# Patient Record
Sex: Male | Born: 1955
Health system: Southern US, Community
[De-identification: ages and names within clinical notes are randomized; demographics above are authoritative.]

## PROBLEM LIST (undated history)

## (undated) DIAGNOSIS — R7303 Prediabetes: Secondary | ICD-10-CM

## (undated) DIAGNOSIS — R519 Headache, unspecified: Secondary | ICD-10-CM

## (undated) DIAGNOSIS — R918 Other nonspecific abnormal finding of lung field: Secondary | ICD-10-CM

## (undated) DIAGNOSIS — I10 Essential (primary) hypertension: Secondary | ICD-10-CM

## (undated) DIAGNOSIS — Z972 Presence of dental prosthetic device (complete) (partial): Secondary | ICD-10-CM

## (undated) DIAGNOSIS — R51 Headache: Secondary | ICD-10-CM

## (undated) DIAGNOSIS — G4730 Sleep apnea, unspecified: Secondary | ICD-10-CM

## (undated) DIAGNOSIS — Z973 Presence of spectacles and contact lenses: Secondary | ICD-10-CM

## (undated) DIAGNOSIS — G8929 Other chronic pain: Secondary | ICD-10-CM

## (undated) DIAGNOSIS — M1990 Unspecified osteoarthritis, unspecified site: Secondary | ICD-10-CM

## (undated) DIAGNOSIS — M549 Dorsalgia, unspecified: Secondary | ICD-10-CM

## (undated) DIAGNOSIS — H9190 Unspecified hearing loss, unspecified ear: Secondary | ICD-10-CM

## (undated) HISTORY — PX: MULTIPLE TOOTH EXTRACTIONS: SHX2053

## (undated) HISTORY — PX: BACK SURGERY: SHX140

## (undated) HISTORY — PX: SPINAL CORD STIMULATOR IMPLANT: SHX2422

---

## 2002-03-19 ENCOUNTER — Encounter: Payer: Self-pay | Admitting: Internal Medicine

## 2002-03-19 ENCOUNTER — Ambulatory Visit (HOSPITAL_COMMUNITY): Admission: RE | Admit: 2002-03-19 | Discharge: 2002-03-19 | Payer: Self-pay | Admitting: Internal Medicine

## 2002-05-06 ENCOUNTER — Ambulatory Visit (HOSPITAL_COMMUNITY): Admission: RE | Admit: 2002-05-06 | Discharge: 2002-05-06 | Payer: Self-pay | Admitting: Neurology

## 2002-05-06 ENCOUNTER — Encounter: Payer: Self-pay | Admitting: Neurology

## 2002-07-27 ENCOUNTER — Encounter: Admission: RE | Admit: 2002-07-27 | Discharge: 2002-07-27 | Payer: Self-pay | Admitting: Neurosurgery

## 2002-07-27 ENCOUNTER — Encounter: Payer: Self-pay | Admitting: Neurosurgery

## 2002-09-25 ENCOUNTER — Encounter: Payer: Self-pay | Admitting: Neurosurgery

## 2002-09-30 ENCOUNTER — Ambulatory Visit: Admission: RE | Admit: 2002-09-30 | Discharge: 2002-09-30 | Payer: Self-pay | Admitting: Neurosurgery

## 2002-10-13 ENCOUNTER — Inpatient Hospital Stay (HOSPITAL_COMMUNITY): Admission: RE | Admit: 2002-10-13 | Discharge: 2002-10-15 | Payer: Self-pay | Admitting: Neurosurgery

## 2002-10-13 ENCOUNTER — Encounter: Payer: Self-pay | Admitting: Neurosurgery

## 2003-02-02 ENCOUNTER — Encounter (HOSPITAL_COMMUNITY): Admission: RE | Admit: 2003-02-02 | Discharge: 2003-03-04 | Payer: Self-pay | Admitting: Neurosurgery

## 2003-03-05 ENCOUNTER — Encounter (HOSPITAL_COMMUNITY): Admission: RE | Admit: 2003-03-05 | Discharge: 2003-04-04 | Payer: Self-pay | Admitting: Neurosurgery

## 2004-01-20 ENCOUNTER — Encounter: Admission: RE | Admit: 2004-01-20 | Discharge: 2004-01-20 | Payer: Self-pay | Admitting: Neurosurgery

## 2004-03-07 ENCOUNTER — Ambulatory Visit (HOSPITAL_COMMUNITY): Admission: RE | Admit: 2004-03-07 | Discharge: 2004-03-08 | Payer: Self-pay | Admitting: Neurosurgery

## 2004-03-14 ENCOUNTER — Ambulatory Visit (HOSPITAL_COMMUNITY): Admission: RE | Admit: 2004-03-14 | Discharge: 2004-03-14 | Payer: Self-pay | Admitting: Neurosurgery

## 2004-05-22 ENCOUNTER — Inpatient Hospital Stay (HOSPITAL_COMMUNITY): Admission: AD | Admit: 2004-05-22 | Discharge: 2004-05-30 | Payer: Self-pay | Admitting: Neurosurgery

## 2004-06-27 ENCOUNTER — Inpatient Hospital Stay (HOSPITAL_COMMUNITY): Admission: EM | Admit: 2004-06-27 | Discharge: 2004-06-29 | Payer: Self-pay | Admitting: Emergency Medicine

## 2004-08-31 ENCOUNTER — Ambulatory Visit (HOSPITAL_COMMUNITY): Admission: RE | Admit: 2004-08-31 | Discharge: 2004-08-31 | Payer: Self-pay | Admitting: Neurosurgery

## 2004-11-14 ENCOUNTER — Ambulatory Visit (HOSPITAL_COMMUNITY): Admission: RE | Admit: 2004-11-14 | Discharge: 2004-11-15 | Payer: Self-pay | Admitting: Neurosurgery

## 2004-11-20 ENCOUNTER — Ambulatory Visit (HOSPITAL_COMMUNITY): Admission: RE | Admit: 2004-11-20 | Discharge: 2004-11-20 | Payer: Self-pay | Admitting: Neurosurgery

## 2004-11-22 ENCOUNTER — Ambulatory Visit (HOSPITAL_COMMUNITY): Admission: RE | Admit: 2004-11-22 | Discharge: 2004-11-22 | Payer: Self-pay | Admitting: Neurosurgery

## 2006-07-26 ENCOUNTER — Ambulatory Visit (HOSPITAL_COMMUNITY): Admission: RE | Admit: 2006-07-26 | Discharge: 2006-07-26 | Payer: Self-pay | Admitting: Family Medicine

## 2008-04-13 ENCOUNTER — Ambulatory Visit (HOSPITAL_COMMUNITY): Admission: RE | Admit: 2008-04-13 | Discharge: 2008-04-13 | Payer: Self-pay | Admitting: *Deleted

## 2008-04-15 ENCOUNTER — Encounter (HOSPITAL_COMMUNITY): Admission: RE | Admit: 2008-04-15 | Discharge: 2008-05-15 | Payer: Self-pay | Admitting: Psychiatry

## 2008-04-20 ENCOUNTER — Ambulatory Visit (HOSPITAL_COMMUNITY): Admission: RE | Admit: 2008-04-20 | Discharge: 2008-04-20 | Payer: Self-pay | Admitting: *Deleted

## 2008-11-16 ENCOUNTER — Ambulatory Visit (HOSPITAL_COMMUNITY): Admission: RE | Admit: 2008-11-16 | Discharge: 2008-11-16 | Payer: Self-pay | Admitting: Pulmonary Disease

## 2009-04-04 ENCOUNTER — Ambulatory Visit (HOSPITAL_COMMUNITY): Admission: RE | Admit: 2009-04-04 | Discharge: 2009-04-04 | Payer: Self-pay | Admitting: General Surgery

## 2010-12-03 ENCOUNTER — Encounter: Payer: Self-pay | Admitting: Internal Medicine

## 2011-02-26 LAB — BLOOD GAS, ARTERIAL
Acid-Base Excess: 1.2 mmol/L (ref 0.0–2.0)
Bicarbonate: 26.1 mEq/L — ABNORMAL HIGH (ref 20.0–24.0)
FIO2: 21 %
O2 Saturation: 90.9 %
Patient temperature: 37
TCO2: 23.5 mmol/L (ref 0–100)
pCO2 arterial: 47.6 mmHg — ABNORMAL HIGH (ref 35.0–45.0)
pH, Arterial: 7.358 (ref 7.350–7.450)
pO2, Arterial: 64.3 mmHg — ABNORMAL LOW (ref 80.0–100.0)

## 2011-03-27 NOTE — Procedures (Signed)
NAME:  Alexander Hatfield, BIFULCO NO.:  [id number]   MEDICAL RECORD NO.:  [id number]          PATIENT TYPE:  OUT   LOCATION:  RESP                          FACILITY:  [facility]   PHYSICIAN:  Edward L. Juanetta Gosling, M.D.DATE OF BIRTH:  1956/01/22   DATE OF PROCEDURE:  DATE OF DISCHARGE:                            PULMONARY FUNCTION TEST   1. Spirometry shows borderline ventilatory defect with evidence of      airflow obstruction.  2. Lung volumes are normal with air trapping.  3. DLCO is mildly reduced.  4. Arterial blood gases show relative hypoxemia and slight increase in      pCO2 suggesting chronic respiratory failure.  5. There is no significant bronchodilator improvement.  This study is      similar to the one of April 21, 2008.      Edward L. Juanetta Gosling, M.D.  Electronically Signed     ELH/MEDQ  D:  11/19/2008  T:  11/19/2008  Job:  [id number]

## 2011-03-27 NOTE — H&P (Signed)
NAME:  Alexander Hatfield, Alexander Hatfield NO.:  [id number]   MEDICAL RECORD NO.:  [id number]          PATIENT TYPE:  AMB   LOCATION:  DAY                           FACILITY:  [facility]   PHYSICIAN:  Dalia Heading, M.D.  DATE OF BIRTH:  1956-03-26   DATE OF ADMISSION:  DATE OF DISCHARGE:  LH                              HISTORY & PHYSICAL   CHIEF COMPLAINT:  Need for screening colonoscopy.   HISTORY OF PRESENT ILLNESS:  The patient is a 55 year old white male who  is referred for endoscopic evaluation.  He needs a colonoscopy for  screening purposes.  No abdominal pain, weight loss, nausea, vomiting,  diarrhea, constipation, melena, or hematochezia have been noted.  He has  never had a colonoscopy.  There is no family history of colon carcinoma.   PAST MEDICAL HISTORY:  Chronic back pain.   PAST SURGICAL HISTORY:  Multiple back surgeries, neck surgery.   CURRENT MEDICATIONS:  Oxycodone, OxyContin, and terazosin.   ALLERGIES:  MRI DYE.   REVIEW OF SYSTEMS:  Noncontributory.   PHYSICAL EXAMINATION:  GENERAL:  The patient is a well-developed, well-  nourished, white male, in no acute distress.  LUNGS:  Clear to auscultation with equal breath sounds bilaterally.  HEART:  Regular rate and rhythm without S3, S4, or murmurs.  ABDOMEN:  Soft, nontender, and nondistended.  No hepatosplenomegaly or  masses are noted.  RECTAL:  Deferred for the procedure.   IMPRESSION:  Need for screening colonoscopy.   PLAN:  The patient is scheduled for a colonoscopy on Apr 04, 2009.  The  risks and benefits of the procedure including bleeding and perforation  were fully explained to the patient, gave informed consent.      Dalia Heading, M.D.  Electronically Signed     MAJ/MEDQ  D:  03/31/2009  T:  04/01/2009  Job:  [id number]   cc:   Corrie Mckusick, M.D.  Fax: [phone]   [facility] Short Stay

## 2011-03-27 NOTE — Procedures (Signed)
NAME:  Alexander Hatfield, Alexander Hatfield NO.:  [id number]   MEDICAL RECORD NO.:  [id number]          PATIENT TYPE:  OUT   LOCATION:  RESP                          FACILITY:  [facility]   PHYSICIAN:  Edward L. Juanetta Gosling, M.D.DATE OF BIRTH:  [birth year]   DATE OF PROCEDURE:  DATE OF DISCHARGE:                            PULMONARY FUNCTION TEST   PULMONARY FUNCTION TEST  1. Spirometry shows no definite ventilatory defect, but does show      evidence of airflow obstruction that is most marked in the smaller      airways.  2. Lung volumes are normal.  3. DLCO was mildly reduced.  4. Arterial blood gases show relative resting hypoxia and elevated      pCO2 suggesting chronic respiratory failure.  5. There is no significant bronchodilator improvement.      Edward L. Juanetta Gosling, M.D.  Electronically Signed     ELH/MEDQ  D:  04/21/2008  T:  04/21/2008  Job:  [id number]   cc:   Dani Gobble, MD  Fax: [phone]

## 2011-03-30 NOTE — H&P (Signed)
NAME:  Alexander Hatfield, Alexander Hatfield NO.:  [id number]   MEDICAL RECORD NO.:  [id number]                   PATIENT TYPE:  INP   LOCATION:  3002                                 FACILITY:  [facility]   PHYSICIAN:  Hewitt Shorts, M.D.            DATE OF BIRTH:  Dec 27, 1955   DATE OF ADMISSION:  06/27/2004  DATE OF DISCHARGE:                                HISTORY & PHYSICAL   HISTORY OF PRESENT ILLNESS:  The patient is a 55 year old left-handed white  male who has been a patient of Payton Doughty, M.D., apparently for an extended  period of time.  He has a history of nine back surgeries and Dr. Channing Mutters placed  a spinal cord stimulator trial in late April with placement of generator and  battery in early May.  The patient did well until about five weeks ago, when  he presented with a cellulitis overlying the generator/battery.  Dr. Channing Mutters  admitted the patient.  The wound was opened, the generator battery was  removed and the wound debrided.  The patient was on one week of intravenous  antibiotics in hospital and then three weeks of outpatient intravenous  vancomycin.   The patient had been in relatively stable condition up until earlier today,  when he noticed breakdown of the thoracic incision where the spinal cord  stimulator leads had been placed.  Those had not been removed five weeks  ago, and he is concerned that part of the wire was protruding out.  I spoke  to the patient at 2130 and instructed him to come immediately to the [facility] emergency room.  He told me that he had already called an  ambulance; however, apparently for some reason he was sent to [facility] despite my instructions to come to [facility].  He was evaluated by Dr. Mosetta Putt at [facility].  He was found  to have a temperature of 99.4, pulse 94, and a blood pressure 156/107.  It  does not appear that any workup was performed, but apparently the  patient  was given Dilaudid and subsequently transferred to [facility],  arriving at about 0230.  During transport he apparently was given some  morphine.   The patient presents to the emergency room complaining of incapacitating  pain across the low back with breakdown of the thoracic wounds.   He describes a history of nine back surgeries as well as the placement of  the spinal cord stimulator this past spring with subsequent development of  cellulitis last month requiring removal of the generator battery about five  weeks ago.   PAST MEDICAL HISTORY:  Notable for a history of hypertension, for which he  takes Diovan 320 mg daily.   Previous surgeries include both anterior cervical diskectomy and fusions in  2001 and 1994 as well several lumbar surgeries and fusions.  He reports an allergy to IV CONTRAST.   CURRENT MEDICATIONS:  Diovan 320 mg daily as well as Percocet and Flexeril.   FAMILY HISTORY:  His mother is 60, in poor health with lupus.  His father  passed on from COPD related to inhalational toxins.   SOCIAL HISTORY:  The patient smokes two packs a day.  He drinks alcohol on a  social basis.  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   REVIEW OF SYSTEMS:  Notable for those described in the history of present  illness and past medical history but otherwise unremarkable.   PHYSICAL EXAMINATION:  GENERAL:  The patient is a well-developed, well-  nourished white male in obvious discomfort.  VITAL SIGNS:  His temperature is 97.4, pulse 81, blood pressure 158/108,  respiratory rate 20.  CHEST:  Lungs are clear to auscultation.  He has symmetrical respiratory  excursion.  CARDIAC:  Regular rate and rhythm, normal S1, S2.  There is no murmur.  ABDOMEN:  Soft, nondistended.  Bowel sounds are present.  EXTREMITIES:  No clubbing, cyanosis, or edema.  WOUNDS:  At the lower end of the thoracic incision is an approximately 3 mm  in length  area of breakdown with some protruding tissue.  I do not see any  associated erythema or purulence.  NEUROLOGIC:  He is standing and walking around the emergency room.   IMPRESSION:  1. Superficial wound breakdown, rule out wound infection.  2. Intractable pain.   PLAN:  The patient will be admitted to the 3000 unit for further care.  Laboratories have been requested, including CBC with differential,  sedimentation rate, blood cultures x2, Chem-24, and will obtain x-rays of  the thoracic and lumbar spine, AP and lateral views of each.  The patient  will be supported with intravenous fluids and kept NPO awaiting Dr. Temple Pacini  evaluation and determination of whether to proceed with surgical  intervention.  The patient in the meantime will be placed on a PCA morphine  pump.                                                Hewitt Shorts, M.D.    RWN/MEDQ  D:  06/27/2004  T:  06/27/2004  Job:  [id number]

## 2011-03-30 NOTE — Op Note (Signed)
NAME:  Alexander Hatfield, Alexander Hatfield NO.:  [id number]   MEDICAL RECORD NO.:  [id number]          PATIENT TYPE:  OIB   LOCATION:  2899                         FACILITY:  [facility]   PHYSICIAN:  Payton Doughty, M.D.      DATE OF BIRTH:  1956/04/21   DATE OF PROCEDURE:  11/14/2004  DATE OF DISCHARGE:                                 OPERATIVE REPORT   PREOPERATIVE DIAGNOSIS:  Intractable back pain status post removal of spinal  cord stimulator for infection.   POSTOPERATIVE DIAGNOSIS:  Intractable back pain status post removal of  spinal cord stimulator for infection.   PROCEDURE:  Placement of spinal cord epidural electrode.   SURGEON:  Payton Doughty, M.D.   ANESTHESIA:  General endotracheal.   PREP:  Prepped with Betadine prep and scrubbed with alcohol wipe.   COMPLICATIONS:  None.   A 55 year old gentleman with severe lumbar spondylosis and intractable pain  who had had a stimulator removed because of infection about five months ago.  Taken to the operating room, smoothly anesthetized and intubated,  placed  prone on the operating table. Following shaving, prepping, and draping in  the usual sterile fashion, skin was infiltrated with 1% lidocaine with  1:400,000 epinephrine. Localizing x-ray was made. Incision was made from the  top of T12 to the bottom of T8. The T11 laminectomy site was dissected free.  The laminotomy was increased. An attempt was made to pass the catheter in  the epidural space which turned out to be densely scarred. Laminotomy of T10  and T9 were then carried out midline to allow dissection of the epidural  space from above and below. This was carried out, and the electrode was able  to pass freely. It sits at the T10/T11 junction, goes up to the top of T9.  Wound was irrigated and hemostasis assured. Anchoring stays were placed, and  the extension wires attached and exited. The strain relief devices were  attached prior to exiting. Wound was irrigated again.  The fascia was  reapproximated with 0 Vicryl in interrupted fashion. Subcutaneous tissue was  reapproximated with 2-0 Vicryl in interrupted fashion. Skin was closed with  3-0 nylon in a running locking fashion. Betadine and Telfa dressing was  applied and made occlusive with OpSite and the patient returned to recovery  room in good condition.       MWR/MEDQ  D:  11/14/2004  T:  11/14/2004  Job:  [id number]

## 2011-03-30 NOTE — Op Note (Signed)
NAME:  SEMIR, BRILL NO.:  [id number]   MEDICAL RECORD NO.:  [id number]                   PATIENT TYPE:  INP   LOCATION:  3172                                 FACILITY:  [facility]   PHYSICIAN:  Payton Doughty, M.D.                   DATE OF BIRTH:  1956-08-27   DATE OF PROCEDURE:  10/13/2002  DATE OF DISCHARGE:                                 OPERATIVE REPORT   PREOPERATIVE DIAGNOSIS:  Spondylosis and severe degenerative disk disease at  L3-4 and L4-5.   POSTOPERATIVE DIAGNOSIS:  Spondylosis and severe degenerative disk disease  at L3-4 and L4-5.   PROCEDURE:  L3-L4 and L4-L5 laminectomy, diskectomy and posterior lumbar  interbody fusion with Ray threaded fusion cages and L3-L5 segmental  instrumentation with pedicle screws and posterior lateral arthrodesis.   SURGEON:  Payton Doughty, M.D.   ASSISTANT:  Coletta Memos, M.D.]   ANESTHESIA:  General endotracheal.   PREPARATION:  Prepped with sterile Betadine prep and scrubbed with alcohol  wipe.   COMPLICATIONS:  None.   INDICATIONS:  This is a 55 year old gentleman with severe spondylitic  disease at L3-4 and L4-5.  He has been previously at L5-S1 a number of years  ago.   DESCRIPTION OF PROCEDURE:  He was taken to the operating room, smoothly  anesthetized and intubated and placed prone on the operating room table.  Following shave, prep and draped in the usual sterile fashion, the skin was  infiltrated with 1% lidocaine with 1:400,000 epinephrine.  The skin was  incised from the bottom of L2 to the top of the L5 and the lamina of L3 and  L4 and the L3-4, L4-5 and L2-3 facet joints were uncovered facilitating the  dissection of the L3, L4 and L5 transverse processes.  Intraoperative x-ray  confirmed correctness level.  Having confirmed correctness level, the pars  intra-articularis lamina and inferior facet of L3 and L4 and the superior  facet of L4 and L5 were removed bilaterally using the drill  and the bone set  aside for grafting.  The ligament of flavum was removed.  The epidural space  was carefully explored and found to be extensively scarred with dense  adhesions.  Careful dissection of all of the nerve roots allowed complete  decompression of the L3, L4 and L5 roots bilaterally.  Having completed this  decompression, diskectomy was carried out bilaterally at L3-4 and L4-5 and a  14 x 21 mm Ray threaded fusion cages were placed.  Pedicle screws were then  placed at L3, L4, and L5 using the standard landmarks.  Intraoperative x-  rays showed good placement of pedicle screws and Ray cages.  The cages were  packed and bone harvested from the facet joints.  They were linked with the  rods and the screws capped and caps tightened.  Final x-rays showed good  placement of  all hardware.  Intertransverse bone was applied after  decorticating the transverse processes.  The wound was once again irrigated  and hemostasis assured.  The fascia was reapproximated with 0 Vicryl in a  running interrupted fashion.  The subcutaneous tissue was reapproximated  with 0 Vicryl in an  interrupted fashion.  The subcuticular tissue was reapproximated with 0  Vicryl in an interrupted fashion.  The skin was closed with 3-0 nylon in a  running locked fashion.  Bacitracin and Telfa dressing was applied and made  occlusive with Op-Site.  The patient returned to the recovery room in good  condition.                                               Payton Doughty, M.D.    MWR/MEDQ  D:  10/13/2002  T:  10/13/2002  Job:  [id number]

## 2011-03-30 NOTE — Discharge Summary (Signed)
NAME:  Alexander Hatfield, OVERLEY NO.:  [id number]   MEDICAL RECORD NO.:  [id number]          PATIENT TYPE:  INP   LOCATION:  3033                         FACILITY:  [facility]   PHYSICIAN:  Payton Doughty, M.D.      DATE OF BIRTH:  27-Apr-1956   DATE OF ADMISSION:  05/22/2004  DATE OF DISCHARGE:  05/30/2004                                 DISCHARGE SUMMARY   ADMITTING DIAGNOSES:  Cellulitis of the spinal cord stimulator site.   DISCHARGE DIAGNOSES:  Cellulitis of the spinal cord stimulator site.   OPERATIVE PROCEDURE:  Removal of spinal cord stimulator.   COMPLICATIONS:  None.   DISCHARGE STATUS:  Well.   A 55 year old left-handed white gentleman who had spinal cord stimulator  placed in April.  Been doing well.  Scratched the top of it, injury to skin.  Developed erythema over the site.  Treated with oral Cipro to no avail.  Is  admitted for removal of the stimulator.   PAST MEDICAL HISTORY:  Remarkable for hypertension.   PHYSICAL EXAMINATION:  GENERAL:  Intact.  NEUROLOGIC:  Intact.  SKIN:  There was cellulitis over the spinal cord stimulator site.   He was admitted and underwent removal of the stimulator.  He has visited  with ID.  Had a PICC line placed.  He was placed on vancomycin.  While in  the hospital his vancomycin levels were stabilized.  He was then arranged to  be discharged home with the vancomycin.  He was discharged on vancomycin  1500 mg q.12h.  Follow-up will be with me in the [facility] office after completion of the antibiotics for suture removal and  for arranging to have a spinal cord stimulator replaced.       MWR/MEDQ  D:  10/17/2004  T:  10/17/2004  Job:  [id number]

## 2011-03-30 NOTE — Op Note (Signed)
NAME:  Alexander Hatfield, PINEDA NO.:  [id number]   MEDICAL RECORD NO.:  [id number]                   PATIENT TYPE:  OIB   LOCATION:  2899                                 FACILITY:  [facility]   PHYSICIAN:  Payton Doughty, M.D.                   DATE OF BIRTH:  12/28/1955   DATE OF PROCEDURE:  03/14/2004  DATE OF DISCHARGE:  03/14/2004                                 OPERATIVE REPORT   PREOPERATIVE DIAGNOSIS:  Implanted spinal cord stimulator.   POSTOPERATIVE DIAGNOSIS:  Implanted spinal cord stimulator.   OPERATION PERFORMED:  Implanted spinal cord stimulator receiver.   SURGEON:  Payton Doughty, M.D.   ANESTHESIA:  General endotracheal.   PREP:  Sterile Betadine prep and scrub with alcohol wipe.   COMPLICATIONS:  None.   NURSE ASSISTANT:  [place].   INDICATIONS FOR PROCEDURE:  The patient is a 55 year old gentleman with an  implanted spinal cord stimulator that has had a successful trial and he is  now for implantation of the receiver.   DESCRIPTION OF PROCEDURE:  The patient was taken to the operating room,  smoothly anesthetized, intubated, and placed prone on the operating table.  Sutures were removed.  Following shave, prep and drape in the usual sterile  fashion, the old skin incision was reopened.  The wires recovered.  They  were disconnected from the extension wires which were divided and removed.  Subcutaneous tunnel was created to the right down to the right side of the  posterior superior iliac spine where the scar from where his bone graft was  taken numerous years ago.  Through separate incision there, a subcutaneous  pocket with a thin layer of skin over it was created.  The receiver was  connected to the wires.  The strain relief protective devices were placed  and secured with 3-0 silk.  The torque screw driver was used to tighten the  screws down, the connections were secured.  The wires and the receiver were  implanted into the subcutaneous  space.  Both incisions were irrigated with  bacitracin.  The fascia and subcutaneous tissues were reapproximated with 3-  0 Vicryl in interrupted fashion and the skin was closed with 3-0 nylon in  simple running fashion.  Betadine Telfa dressing was applied, made occlusive  with OpSite.  The patient was then transferred to the recovery room in good  condition.                                               Payton Doughty, M.D.    MWR/MEDQ  D:  03/14/2004  T:  03/15/2004  Job:  [id number]
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
## 2011-03-30 NOTE — H&P (Signed)
NAME:  Alexander Hatfield, Alexander Hatfield NO.:  [id number]   MEDICAL RECORD NO.:  [id number]          PATIENT TYPE:  OIB   LOCATION:  2899                         FACILITY:  [facility]   PHYSICIAN:  Payton Doughty, M.D.      DATE OF BIRTH:  08/06/1956   DATE OF ADMISSION:  11/22/2004  DATE OF DISCHARGE:                                HISTORY & PHYSICAL   ADMITTING DIAGNOSIS:  Spinal cord stimulator in place.   SERVICE:  Neurosurgery.   BODY OF TEXT:  This is a 55 year old gentleman who a week ago underwent  placement of a spinal cord stimulator.  He has reasonable coverage, except  does not get quite below his knees.  The plan is to admit him and move his  stimulator down and implant a battery pack.   MEDICAL HISTORY:  Medical history is remarkable for hypertension; he takes  Diovan 325 mg a day.  He has had an anterior cervical fusion in 2001 and in  1994, and has had lumbar surgeries and fusions as noted above.   ALLERGIES:  IV CONTRAST.   FAMILY HISTORY:  Mom is 80, in poor health.  Father died of COPD.   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   REVIEW OF SYSTEMS:  Review of systems is remarkable for back and leg pain.   PHYSICAL EXAMINATION:  HEENT:  Exam is within normal limits.  NECK:  He has reasonable range of motion in his neck.  CHEST:  Chest is clear.  CARDIAC:  Regular rate and rhythm.  ABDOMEN:  Abdomen is nontender with no hepatosplenomegaly.  Incisions are  well-healed.  EXTREMITIES:  Extremities are without clubbing or cyanosis.  GU:  Exam is deferred.  PERIPHERAL VASCULAR:  Peripheral pulses are good.  SKIN:  He has got a little bit of drainage around his wires.  NEUROLOGIC:  Neurologically, He is awake, alert and oriented.  His cranial  nerves are intact.  Motor exam is intact, except for the dorsi and  plantarflexors of the right foot, which have been 2/5 for the past 10  years.   CLINICAL IMPRESSION:  Intractable back pain with displaced spinal cord  stimulator.   PLAN:  Plan is for moving the stimulator and electrodes down to cover over  T11 and then to place a battery generator. He understands the risks and  benefits, and wishes to proceed.       MWR/MEDQ  D:  11/22/2004  T:  11/22/2004  Job:  [id number]

## 2011-03-30 NOTE — H&P (Signed)
NAME:  Alexander Hatfield, Alexander Hatfield NO.:  [id number]   MEDICAL RECORD NO.:  [id number]                   PATIENT TYPE:  INP   LOCATION:  3033                                 FACILITY:  [facility]   PHYSICIAN:  Payton Doughty, M.D.                   DATE OF BIRTH:  10/24/1956   DATE OF ADMISSION:  05/22/2004  DATE OF DISCHARGE:                                HISTORY & PHYSICAL   ADMISSION DIAGNOSIS:  Superficial cellulitis of her spinal cord stimulator  site.   SERVICE:  Neurosurgery.   BODY OF TEXT:  This is a 55 year old left handed white gentleman with a  spinal cord stimulator placed March 07, 2004.  He had done well and then  about a week ago, was scratching an itch over the top of it and injured his  skin a little bit.  He came to the office three days ago with redness and  erythema over it but no palpable fluid in the pouch.  He was placed on oral  Cipro and told to come back to the office today.  He reports back to the  office today.  The erythema is increased and he is now admitted for IV  antibiotics and probable removal of the spinal cord stimulator and receiver.   PAST MEDICAL HISTORY:  Remarkable for hypertension.   CURRENT MEDICATIONS:  He takes Diovan 325 mg a day.   ALLERGIES:  IVP dye.   PAST SURGICAL HISTORY:  Anterior cervical discectomy and fusion in 2001 and  1994 as well as several lumbar fusions.   SOCIAL HISTORY:  Smokes two packs cigarettes per day, drinks alcohol on a  social basis.  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   FAMILY HISTORY:  Mother is 64 and in poor health with lupus.  Father is  deceased from COPD related to inhalational toxins.   REVIEW OF SYMPTOMS:  Remarkable for glasses, hearing loss, hypertension, leg  weakness, back pain, and arthritis.   PHYSICAL EXAMINATION:  HEENT:  Within normal limits.  NECK:  Reasonable range of motion.  CHEST:  Clear.  CARDIAC:  Regular rate and  rhythm.  ABDOMEN:  Nontender, no hepatosplenomegaly.  EXTREMITIES:  No clubbing or cyanosis.  GU:  Deferred.  PULSES:  Peripheral pulses are good.  BACK:  His back over the stimulator has a lot of erythema and redness  reaching all the way around to his right flank and past the midline to the  left.  NEUROLOGICAL:  Awake, alert, and oriented.  Cranial nerves intact.  Motor  exam shows 5/5 strength throughout the upper extremities and in the left  lower extremity.  The right lower extremity has weakness in the dorsiflexion  to about 3/5 and plantar flexion is 3/5, reflexes are 1 at the knees, trace  at the right ankle, absent at the  left.  Straight leg raise is negative.  His fusion has been solid on both CT and flexion extension criteria.   CLINICAL IMPRESSION:  Cellulitis over the top of spinal cord stimulator.  He  is going to be admitted, receive IV antibiotics, consultation of infectious  disease, and remove the spinal cord stimulating receiver, try to leave the  electrode in the epidural space to hold the space so that when it is  reimplanted, he can still derive benefit from the stimulator.  The risks and  benefits of this approach have been discussed with him and he wishes to  proceed.                                                Payton Doughty, M.D.    MWR/MEDQ  D:  05/22/2004  T:  05/22/2004  Job:  [id number]

## 2011-03-30 NOTE — Op Note (Signed)
NAME:  Alexander Hatfield, Alexander Hatfield NO.:  [id number]   MEDICAL RECORD NO.:  [id number]                   PATIENT TYPE:  OIB   LOCATION:  3172                                 FACILITY:  [facility]   PHYSICIAN:  Payton Doughty, M.D.                   DATE OF BIRTH:  [birth year]   DATE OF PROCEDURE:  03/07/2004  DATE OF DISCHARGE:                                 OPERATIVE REPORT   PREOPERATIVE DIAGNOSIS:  Intractable back and right lower extremity pain.   POSTOPERATIVE DIAGNOSIS:  Intractable back and right lower extremity pain.   OPERATIVE PROCEDURE:  Placement of an epidural thoracic spinal cord  stimulator.   SURGEON:  Payton Doughty, M.D.   NURSE ASSESSMENTBasilia Jumbo.   PREPARATION:  Sterile Betadine prep and scrub with alcohol wipe.   COMPLICATIONS:  None.   BODY OF TEXT:  A 55 year old gentleman with intractable back pain and  chronic right S1 radiculopathy.  He was taken to the operating room and  smoothly anesthetized and intubated, placed prone on the operating table.  Following shave, prep, and drape in the usual fashion, the skin was  infiltrated with 1% lidocaine and 1:400,000 epinephrine.  The skin incision  was made directly over the T11 spinous process.  The laminae of T11 were  exposed bilaterally.  Partial laminectomy of T11 was carried out, exposing  the posterior epidural space.  The ANS spinal cord stimulating electrode  array was advanced from this laminectomy superiorly.  On initial x-ray it  was slightly off to the left side and not quite far enough up.  It was  repositioned, slightly biased toward the right, and advanced further.  The  final position was such that the stimulating electrode started at the bottom  of T10 and extended up to just lower T8.  It was in the midline, perhaps  slightly off to the right.  The wound was irrigated and hemostasis assured.  The strain relief devices were attached to the interspinous ligament on the  anterior  portion of T12 and secured to the wires.  The extension devices  were attached and the water protection seals placed and secured.  The  extension cords were exited by trocar on the left side into the scapula.  The wound was once again irrigated and hemostasis assured.  The fascia was  reapproximated with 0 Vicryl in interrupted fashion, subcutaneous tissue was  reapproximated with 0 Vicryl in interrupted fashion, subcuticular tissue was  reapproximated with 3-0 Vicryl in interrupted fashion, and the skin was  closed with 3-0 nylon in simple running fashion.  Betadine and Telfa  dressing were placed at both sites.  The patient returned to the recovery  room in good condition.  Payton Doughty, M.D.    MWR/MEDQ  D:  03/07/2004  T:  03/07/2004  Job:  [id number]

## 2011-03-30 NOTE — Op Note (Signed)
NAME:  Alexander, Hatfield NO.:  [id number]   MEDICAL RECORD NO.:  [id number]                   PATIENT TYPE:  INP   LOCATION:  3033                                 FACILITY:  [facility]   PHYSICIAN:  Payton Doughty, M.D.                   DATE OF BIRTH:  11-29-1955   DATE OF PROCEDURE:  05/26/2004  DATE OF DISCHARGE:                                 OPERATIVE REPORT   PREOPERATIVE DIAGNOSIS:  Skin cellulitis over a spinal cord stimulator.   POSTOPERATIVE DIAGNOSIS:  Skin cellulitis over a spinal cord stimulator.   PROCEDURE:  Removal of spinal cord stimulating receiver.   SURGEON:  Payton Doughty, M.D.   ANESTHESIA:  General endotracheal.   PREPARATION:  Sterile Betadine prep and scrub with alcohol wipe.   COMPLICATIONS:  None.   BODY OF TEXT:  This is a 55 year old gentleman with an implanted spinal cord  stimulator, who has cellulitis over the receiver site and a possible  abscess.  He was taken to the operating room and smoothly anesthetized and  intubated, placed prone on the operating table.  Following shave, prep and  drape in the usual sterile fashion, the site of the stimulating electrode  was opened and the wires localized and divided.  The electrode was left in  place for use at a future site.  That incision was irrigated with  bacitracin.  There was no evidence of infection within it, and it was closed  with successive layers of 0 Vicryl, 2-0 Vicryl, and 3-0 nylon.  Attention  was then turned to the receiver site, where the cellulitic skin was divided  in the old incision site.  The skin itself was thickened and indurated;  however, entry into the site of the stimulating receiver was very well-  healed and there was no evidence of infection within there.  Because of the  contact with the infected skin, however, it was felt prudent to remove the  stimulating receiver.  This was removed and the wires withdrawn from under  the skin.  It was irrigated  copiously with bacitracin and closed over a #7  Jackson-Pratt drain.  There was one area of skin breakdown that was closed  primarily.  The incision itself was closed with 3-0 nylon in a simple  fashion.  A single nylon stitch was used to secure the drain.  No stitches  were placed subcutaneously for a desire not to place foreign bodies in an  infected area.  Betadine and Telfa dressings were used to cover the  incision, and the patient returned to the recovery room in good condition.  Payton Doughty, M.D.    MWR/MEDQ  D:  05/26/2004  T:  05/27/2004  Job:  [id number]

## 2011-03-30 NOTE — H&P (Signed)
NAME:  Alexander Hatfield, Alexander Hatfield NO.:  [id number]   MEDICAL RECORD NO.:  [id number]                   PATIENT TYPE:  OIB   LOCATION:  2899                                 FACILITY:  [facility]   PHYSICIAN:  Payton Doughty, M.D.                   DATE OF BIRTH:  1956/10/20   DATE OF ADMISSION:  03/14/2004  DATE OF DISCHARGE:                                HISTORY & PHYSICAL   ADMISSION DIAGNOSIS:  Installed spinal cord stimulator.   HISTORY:  This 55 year old, left-handed white gentleman who has had several  lumbar fusions and has had a right S1 radiculopathy since  1989, had a  spinal cord stimulator placed last week, and it was revised.  He now is  getting good relief during a trial, and presents for implant of the  receiver.  This is an interim note.   PHYSICAL EXAMINATION:  Intact.  His incisions are well-healed.   PLAN:  He is being taken to the operating room for an implantation of the  receiver.  The risks and benefits of this approach have been discussed with  him, and he wishes to proceed.                                                Payton Doughty, M.D.    MWR/MEDQ  D:  03/14/2004  T:  03/14/2004  Job:  [id number]

## 2011-03-30 NOTE — Op Note (Signed)
NAME:  Alexander Hatfield, Alexander Hatfield NO.:  [id number]   MEDICAL RECORD NO.:  [id number]                   PATIENT TYPE:  INP   LOCATION:  3002                                 FACILITY:  [facility]   PHYSICIAN:  Payton Doughty, M.D.                   DATE OF BIRTH:  [birth year]   DATE OF PROCEDURE:  06/27/2004  DATE OF DISCHARGE:                                 OPERATIVE REPORT   PREOPERATIVE DIAGNOSIS:  Infected incision at the site of an epidural spinal  cord stimulating electrode.   POSTOPERATIVE DIAGNOSIS:  Infected incision at the site of an epidural  spinal cord stimulating electrode.   OPERATIVE PROCEDURE:  Removal of electrode and exploration of incision.   SURGEON:  Payton Doughty, M.D.   SERVICE:  Neurosurgery   ANESTHESIA:  General endotracheal anesthesia.   PREPARATION:  Betadine and alcohol wipe.   COMPLICATIONS:  None.   ASSISTANT:  [place]   BODY OF TEXT:  This is a 55 year old gentleman who had a spinal cord  stimulating electrode placed some months ago.  He secondarily became  infected at the receiver that was removed about a month ago.  He had been  doing well and then developed back pain and drainage at the electrode site  as he did last night.  He is now taken to the operating room for exploration  of his site.  The patient was taken to the operating room, smoothly  anesthetized and intubated, placed prone on the operating table.  Following  shave, prep, and drape in the usual sterile fashion, the skin incision in  the thoracic spine was opened.  The tract of drainage was followed straight  down to a wire.  Careful dissection along the course of the wire and  irrigation revealed that the infection did not get all the way down the wire  but was stopped fairly superficially.  During the course of dissection, the  electrode was uncovered and removed because of fear of contamination.  The  entire thing was irrigated with Betadine, Bacitracin, and  saline.  It was  closed with 2-0 Vicryl and 3-0 nylon.  The patient returned to the recovery  room in good condition.                                               Payton Doughty, M.D.    MWR/MEDQ  D:  06/27/2004  T:  06/27/2004  Job:  [id number]

## 2011-03-30 NOTE — Discharge Summary (Signed)
   NAME:  AREN, PRYDE NO.:  [id number]   MEDICAL RECORD NO.:  [id number]                   PATIENT TYPE:  INP   LOCATION:  3013                                 FACILITY:  [facility]   PHYSICIAN:  Payton Doughty, M.D.                   DATE OF BIRTH:  1956-05-25   DATE OF ADMISSION:  10/13/2002  DATE OF DISCHARGE:  10/15/2002                                 DISCHARGE SUMMARY   ADMISSION DIAGNOSIS:  Spondylosis, L3-4, L4-5.   DISCHARGE DIAGNOSIS:  Spondylosis, L3-4, L4-5.   PROCEDURE:  L3-4, L4-5 laminectomy and diskectomy, posterior lumbar right  fusion, segmental instrumentation, posterolateral arthrodesis.   COMPLICATIONS:  None.   DISCHARGE STATUS:  Alive and well.   __________  A 55 year old right handed white gentleman who is history and physical is on  the chart. He has had several disk operations at 5-1 and has presented with  increasing discomfort at L3-4 and L4-5. Diskography is positive. Medical  history is remarkable for hypertension.   MEDICATIONS:  1. Diovan.  2. Percocet.   ALLERGIES:  IVP dye.   PHYSICAL EXAMINATION:  GENERAL:  Intact.  NEUROLOGIC EXAM:  Showed a right L5 and S1 radiculopathy.   HOSPITAL COURSE:  He was admitted after obtaining normal laboratory values  and underwent a 3-4, 4-5 laminectomy and diskectomy, posterior lumbar right  fusion with right-sided fusion cages __________ instrumentation.  Postoperatively, he had done extremely well. The first day, he was up and  about and Foley was out. The second day, he was up and about and PCA  stopped, and he is now up, eating and voiding normally, using oral pain  medications and wishing to be discharged home. He is being discharged home  in care of his family on Percocet for pain. He is to followup in [facility] office in about a week for suture removal.                                               Payton Doughty, M.D.    MWR/MEDQ  D:   10/15/2002  T:  10/16/2002  Job:  [id number]

## 2011-03-30 NOTE — H&P (Signed)
NAME:  Alexander Hatfield, Alexander Hatfield NO.:  [id number]   MEDICAL RECORD NO.:  [id number]          PATIENT TYPE:  OIB   LOCATION:                               FACILITY:  [facility]   PHYSICIAN:  Payton Doughty, M.D.      DATE OF BIRTH:  04/24/1956   DATE OF ADMISSION:  11/14/2004  DATE OF DISCHARGE:                                HISTORY & PHYSICAL   ADMITTING DIAGNOSIS:  Intractable back pain.   This is a 55 year old left-hand white gentleman who had a spinal cord  stimulator placed over a year ago.  He did well initially and then scratched  over the generator batter, had cellulitis, and had removal of the battery.  Then in August had infection of the epidural portion which was also removed.  He had a course of intravenous vancomycin after a short hospital stay.  He  now has intractable back and right leg and is admitted for replacement of  his stimulator.   PAST MEDICAL HISTORY:  Known for hypertension, takes Diovan 320 mg a day.  He has had an anterior cervical diskectomy and fusions in 2001 and 1994 had  lumbar surgeries and fusions.   ALLERGIES:  IV contrast.   MEDICATIONS:  Diovan 320 mg a day, Percocet, and Flexeril.   FAMILY HISTORY:  Mom is 75 in poor health.  Father died of COPD.   SOCIAL HISTORY:  The patient smokes two packs of cigarettes a day.  Drinks  alcohol on a social basis.  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   REVIEW OF SYSTEMS:  Remarkable for back pain and leg pain.   His sed rate is 9.  MRI does not demonstrate any new compression.   PHYSICAL EXAMINATION:  HEENT:  Within normal limits.  He has regional range  of motion in his neck.  CHEST:  Clear.  CARDIAC:  Regular rate and rhythm.  ABDOMEN:  Nontender.  No hepatosplenomegaly.  All of his incisions are well-  healed.  EXTREMITIES:  Without clubbing, cyanosis.  GU:  Deferred.  PERIPHERAL PULSES:  Good.  NEUROLOGIC:  He is awake, alert, and oriented.  Cranial nerves are intact.  Motor exam is intact  except for the dorsal and plantarflexions of the right  foot which have been 2/5 for the past 10 years.   CLINICAL IMPRESSION:  Intractable back pain.   He is admitted for reimplantation of spinal cord stimulator via an open  incision.  This will be a trial.  The risks and benefits of this approach  have been discussed with him and he wishes to proceed.        ___________________________________________  Payton Doughty, M.D.    MWR/MEDQ  D:  11/14/2004  T:  11/14/2004  Job:  [id number]

## 2011-03-30 NOTE — Op Note (Signed)
NAME:  DREVON, PLOG NO.:  [id number]   MEDICAL RECORD NO.:  [id number]          PATIENT TYPE:  OUT   LOCATION:  VASC                         FACILITY:  [facility]   PHYSICIAN:  Payton Doughty, M.D.      DATE OF BIRTH:  03/15/1956   DATE OF PROCEDURE:  11/22/2004  DATE OF DISCHARGE:  11/20/2004                                 OPERATIVE REPORT   PREOPERATIVE DIAGNOSIS:  Intractable back pain with implanted spinal cord  stimulator at T10.   POSTOPERATIVE DIAGNOSIS:  Intractable back pain with implanted spinal cord  stimulator at T10.   OPERATION PERFORMED:  Revision of spinal cord stimulator down to T11 and  implanting battery pulse generator.   SURGEON:  Payton Doughty, M.D.   ANESTHESIA:  General endotracheal.   PREP:  Sterile Betadine prep and scrub with alcohol wipe.   COMPLICATIONS:  None.   INDICATIONS FOR PROCEDURE:  The patient is a 55 year old gentleman who has  had an electrode implanted.  It was a bit high at T10, is going to be moved  down and then have the pulse generator placed.   DESCRIPTION OF PROCEDURE:  The patient was taken to the operating room,  smoothly anesthetized, intubated and placed prone on the operating table.  Following shave, prep and drape in the usual sterile fashion, the old  implant site was exposed.  The electrode was at mid-T10.  Working on the  lamina of T11 where there had been a prior electrode impaction, it was  gently dissected down to the dura and the electrode pulled back to a  satisfactory position such that it was in the same place that it was when it  had been working eight months ago.  The electrode was then anchored down and  tunneled subcutaneously to the left hip area where through a transverse  incision, the battery pack was placed after being connected to the  electrodes and secured.  Following placement, impedance was tested and found  to be within acceptable limits.  Both wounds were irrigated and hemostasis  assured, closed with successive layers of 2-0 Vicryl and 3-0 nylon.  Betadine and Telfa dressings were applied.  The patient was then transferred  to the recovery room in good condition.       MWR/MEDQ  D:  11/22/2004  T:  11/22/2004  Job:  [id number]
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
## 2011-08-09 LAB — BLOOD GAS, ARTERIAL
Acid-Base Excess: 4.9 — ABNORMAL HIGH
Bicarbonate: 29.9 — ABNORMAL HIGH
O2 Saturation: 90.9
Patient temperature: 37
TCO2: 26.8
pCO2 arterial: 52.7 — ABNORMAL HIGH
pH, Arterial: 7.372
pO2, Arterial: 61.8 — ABNORMAL LOW

## 2014-11-15 DIAGNOSIS — E669 Obesity, unspecified: Secondary | ICD-10-CM | POA: Diagnosis not present

## 2014-11-15 DIAGNOSIS — I10 Essential (primary) hypertension: Secondary | ICD-10-CM | POA: Diagnosis not present

## 2014-11-15 DIAGNOSIS — M545 Low back pain: Secondary | ICD-10-CM | POA: Diagnosis not present

## 2014-11-25 DIAGNOSIS — M549 Dorsalgia, unspecified: Secondary | ICD-10-CM | POA: Diagnosis not present

## 2014-11-25 DIAGNOSIS — M47816 Spondylosis without myelopathy or radiculopathy, lumbar region: Secondary | ICD-10-CM | POA: Diagnosis not present

## 2015-03-17 DIAGNOSIS — I10 Essential (primary) hypertension: Secondary | ICD-10-CM | POA: Diagnosis not present

## 2015-03-17 DIAGNOSIS — R609 Edema, unspecified: Secondary | ICD-10-CM | POA: Diagnosis not present

## 2015-03-17 DIAGNOSIS — E669 Obesity, unspecified: Secondary | ICD-10-CM | POA: Diagnosis not present

## 2015-03-17 DIAGNOSIS — M545 Low back pain: Secondary | ICD-10-CM | POA: Diagnosis not present

## 2015-03-24 DIAGNOSIS — M549 Dorsalgia, unspecified: Secondary | ICD-10-CM | POA: Diagnosis not present

## 2015-03-24 DIAGNOSIS — M47816 Spondylosis without myelopathy or radiculopathy, lumbar region: Secondary | ICD-10-CM | POA: Diagnosis not present

## 2015-04-28 DIAGNOSIS — M545 Low back pain: Secondary | ICD-10-CM | POA: Diagnosis not present

## 2015-04-28 DIAGNOSIS — R609 Edema, unspecified: Secondary | ICD-10-CM | POA: Diagnosis not present

## 2015-04-28 DIAGNOSIS — E669 Obesity, unspecified: Secondary | ICD-10-CM | POA: Diagnosis not present

## 2015-04-28 DIAGNOSIS — I10 Essential (primary) hypertension: Secondary | ICD-10-CM | POA: Diagnosis not present

## 2015-07-07 DIAGNOSIS — M47816 Spondylosis without myelopathy or radiculopathy, lumbar region: Secondary | ICD-10-CM | POA: Diagnosis not present

## 2015-07-07 DIAGNOSIS — M549 Dorsalgia, unspecified: Secondary | ICD-10-CM | POA: Diagnosis not present

## 2015-07-21 DIAGNOSIS — E669 Obesity, unspecified: Secondary | ICD-10-CM | POA: Diagnosis not present

## 2015-07-21 DIAGNOSIS — R609 Edema, unspecified: Secondary | ICD-10-CM | POA: Diagnosis not present

## 2015-07-21 DIAGNOSIS — Z23 Encounter for immunization: Secondary | ICD-10-CM | POA: Diagnosis not present

## 2015-07-21 DIAGNOSIS — I10 Essential (primary) hypertension: Secondary | ICD-10-CM | POA: Diagnosis not present

## 2015-07-21 DIAGNOSIS — M545 Low back pain: Secondary | ICD-10-CM | POA: Diagnosis not present

## 2015-10-17 DIAGNOSIS — I10 Essential (primary) hypertension: Secondary | ICD-10-CM | POA: Diagnosis not present

## 2015-10-17 DIAGNOSIS — M545 Low back pain: Secondary | ICD-10-CM | POA: Diagnosis not present

## 2015-10-17 DIAGNOSIS — Z125 Encounter for screening for malignant neoplasm of prostate: Secondary | ICD-10-CM | POA: Diagnosis not present

## 2015-10-17 DIAGNOSIS — Z0000 Encounter for general adult medical examination without abnormal findings: Secondary | ICD-10-CM | POA: Diagnosis not present

## 2015-10-17 DIAGNOSIS — R609 Edema, unspecified: Secondary | ICD-10-CM | POA: Diagnosis not present

## 2015-10-17 DIAGNOSIS — E669 Obesity, unspecified: Secondary | ICD-10-CM | POA: Diagnosis not present

## 2015-10-17 DIAGNOSIS — Z1211 Encounter for screening for malignant neoplasm of colon: Secondary | ICD-10-CM | POA: Diagnosis not present

## 2015-10-20 DIAGNOSIS — M549 Dorsalgia, unspecified: Secondary | ICD-10-CM | POA: Diagnosis not present

## 2015-10-20 DIAGNOSIS — M47816 Spondylosis without myelopathy or radiculopathy, lumbar region: Secondary | ICD-10-CM | POA: Diagnosis not present

## 2015-11-10 DIAGNOSIS — H43812 Vitreous degeneration, left eye: Secondary | ICD-10-CM | POA: Diagnosis not present

## 2016-01-19 DIAGNOSIS — M47816 Spondylosis without myelopathy or radiculopathy, lumbar region: Secondary | ICD-10-CM | POA: Diagnosis not present

## 2016-01-19 DIAGNOSIS — M62830 Muscle spasm of back: Secondary | ICD-10-CM | POA: Diagnosis not present

## 2016-01-19 DIAGNOSIS — M549 Dorsalgia, unspecified: Secondary | ICD-10-CM | POA: Diagnosis not present

## 2016-04-16 DIAGNOSIS — M545 Low back pain: Secondary | ICD-10-CM | POA: Diagnosis not present

## 2016-04-16 DIAGNOSIS — R609 Edema, unspecified: Secondary | ICD-10-CM | POA: Diagnosis not present

## 2016-04-16 DIAGNOSIS — I10 Essential (primary) hypertension: Secondary | ICD-10-CM | POA: Diagnosis not present

## 2016-04-19 DIAGNOSIS — M62830 Muscle spasm of back: Secondary | ICD-10-CM | POA: Diagnosis not present

## 2016-04-19 DIAGNOSIS — M549 Dorsalgia, unspecified: Secondary | ICD-10-CM | POA: Diagnosis not present

## 2016-04-19 DIAGNOSIS — M47816 Spondylosis without myelopathy or radiculopathy, lumbar region: Secondary | ICD-10-CM | POA: Diagnosis not present

## 2016-11-19 DIAGNOSIS — M549 Dorsalgia, unspecified: Secondary | ICD-10-CM | POA: Diagnosis not present

## 2016-11-19 DIAGNOSIS — M47816 Spondylosis without myelopathy or radiculopathy, lumbar region: Secondary | ICD-10-CM | POA: Diagnosis not present

## 2017-02-21 DIAGNOSIS — M47816 Spondylosis without myelopathy or radiculopathy, lumbar region: Secondary | ICD-10-CM | POA: Diagnosis not present

## 2017-04-18 DIAGNOSIS — M545 Low back pain: Secondary | ICD-10-CM | POA: Diagnosis not present

## 2017-04-18 DIAGNOSIS — I10 Essential (primary) hypertension: Secondary | ICD-10-CM | POA: Diagnosis not present

## 2017-04-18 DIAGNOSIS — R609 Edema, unspecified: Secondary | ICD-10-CM | POA: Diagnosis not present

## 2017-06-20 DIAGNOSIS — M47816 Spondylosis without myelopathy or radiculopathy, lumbar region: Secondary | ICD-10-CM | POA: Diagnosis not present

## 2017-06-20 DIAGNOSIS — M549 Dorsalgia, unspecified: Secondary | ICD-10-CM | POA: Diagnosis not present

## 2017-06-30 ENCOUNTER — Emergency Department (HOSPITAL_COMMUNITY): Payer: Medicare Other

## 2017-06-30 ENCOUNTER — Inpatient Hospital Stay (HOSPITAL_COMMUNITY)
Admission: EM | Admit: 2017-06-30 | Discharge: 2017-07-02 | DRG: 312 | Disposition: A | Discharge status: Home / Self Care | Payer: Medicare Other | Attending: Pulmonary Disease | Admitting: Pulmonary Disease

## 2017-06-30 ENCOUNTER — Encounter (HOSPITAL_COMMUNITY): Payer: Self-pay | Admitting: Cardiology

## 2017-06-30 DIAGNOSIS — Z72820 Sleep deprivation: Secondary | ICD-10-CM | POA: Diagnosis not present

## 2017-06-30 DIAGNOSIS — I700 Atherosclerosis of aorta: Secondary | ICD-10-CM | POA: Diagnosis not present

## 2017-06-30 DIAGNOSIS — G4733 Obstructive sleep apnea (adult) (pediatric): Secondary | ICD-10-CM | POA: Diagnosis not present

## 2017-06-30 DIAGNOSIS — Z888 Allergy status to other drugs, medicaments and biological substances status: Secondary | ICD-10-CM

## 2017-06-30 DIAGNOSIS — Z79899 Other long term (current) drug therapy: Secondary | ICD-10-CM

## 2017-06-30 DIAGNOSIS — Z6841 Body Mass Index (BMI) 40.0 and over, adult: Secondary | ICD-10-CM

## 2017-06-30 DIAGNOSIS — G8929 Other chronic pain: Secondary | ICD-10-CM | POA: Diagnosis present

## 2017-06-30 DIAGNOSIS — M545 Low back pain: Secondary | ICD-10-CM | POA: Diagnosis not present

## 2017-06-30 DIAGNOSIS — M549 Dorsalgia, unspecified: Secondary | ICD-10-CM | POA: Diagnosis not present

## 2017-06-30 DIAGNOSIS — R55 Syncope and collapse: Secondary | ICD-10-CM | POA: Diagnosis not present

## 2017-06-30 DIAGNOSIS — R4020 Unspecified coma: Secondary | ICD-10-CM | POA: Diagnosis not present

## 2017-06-30 DIAGNOSIS — M62838 Other muscle spasm: Secondary | ICD-10-CM | POA: Diagnosis not present

## 2017-06-30 DIAGNOSIS — R4182 Altered mental status, unspecified: Secondary | ICD-10-CM | POA: Diagnosis present

## 2017-06-30 DIAGNOSIS — Z79891 Long term (current) use of opiate analgesic: Secondary | ICD-10-CM | POA: Diagnosis not present

## 2017-06-30 DIAGNOSIS — R451 Restlessness and agitation: Secondary | ICD-10-CM | POA: Diagnosis not present

## 2017-06-30 DIAGNOSIS — R42 Dizziness and giddiness: Secondary | ICD-10-CM | POA: Diagnosis not present

## 2017-06-30 DIAGNOSIS — T50901A Poisoning by unspecified drugs, medicaments and biological substances, accidental (unintentional), initial encounter: Secondary | ICD-10-CM

## 2017-06-30 DIAGNOSIS — T507X5A Adverse effect of analeptics and opioid receptor antagonists, initial encounter: Secondary | ICD-10-CM | POA: Diagnosis not present

## 2017-06-30 DIAGNOSIS — Y92230 Patient room in hospital as the place of occurrence of the external cause: Secondary | ICD-10-CM | POA: Diagnosis present

## 2017-06-30 DIAGNOSIS — T40601A Poisoning by unspecified narcotics, accidental (unintentional), initial encounter: Secondary | ICD-10-CM | POA: Diagnosis not present

## 2017-06-30 DIAGNOSIS — I119 Hypertensive heart disease without heart failure: Secondary | ICD-10-CM | POA: Diagnosis present

## 2017-06-30 DIAGNOSIS — R61 Generalized hyperhidrosis: Secondary | ICD-10-CM | POA: Diagnosis present

## 2017-06-30 DIAGNOSIS — G4730 Sleep apnea, unspecified: Secondary | ICD-10-CM | POA: Diagnosis not present

## 2017-06-30 DIAGNOSIS — R404 Transient alteration of awareness: Secondary | ICD-10-CM | POA: Diagnosis not present

## 2017-06-30 DIAGNOSIS — I10 Essential (primary) hypertension: Secondary | ICD-10-CM | POA: Diagnosis present

## 2017-06-30 HISTORY — DX: Dorsalgia, unspecified: M54.9

## 2017-06-30 HISTORY — DX: Other chronic pain: G89.29

## 2017-06-30 LAB — CBC WITH DIFFERENTIAL/PLATELET
Basophils Absolute: 0.1 10*3/uL (ref 0.0–0.1)
Basophils Relative: 0 %
Eosinophils Absolute: 0.3 10*3/uL (ref 0.0–0.7)
Eosinophils Relative: 2 %
HCT: 42.6 % (ref 39.0–52.0)
Hemoglobin: 13.9 g/dL (ref 13.0–17.0)
Lymphocytes Relative: 13 %
Lymphs Abs: 2.2 10*3/uL (ref 0.7–4.0)
MCH: 30.3 pg (ref 26.0–34.0)
MCHC: 32.6 g/dL (ref 30.0–36.0)
MCV: 93 fL (ref 78.0–100.0)
Monocytes Absolute: 1.1 10*3/uL — ABNORMAL HIGH (ref 0.1–1.0)
Monocytes Relative: 7 %
Neutro Abs: 12.5 10*3/uL — ABNORMAL HIGH (ref 1.7–7.7)
Neutrophils Relative %: 78 %
Platelets: 233 10*3/uL (ref 150–400)
RBC: 4.58 MIL/uL (ref 4.22–5.81)
RDW: 13.5 % (ref 11.5–15.5)
WBC: 16.1 10*3/uL — ABNORMAL HIGH (ref 4.0–10.5)

## 2017-06-30 LAB — I-STAT CHEM 8, ED
BUN: 18 mg/dL (ref 6–20)
Calcium, Ion: 1.17 mmol/L (ref 1.15–1.40)
Chloride: 108 mmol/L (ref 101–111)
Creatinine, Ser: 1.8 mg/dL — ABNORMAL HIGH (ref 0.61–1.24)
Glucose, Bld: 121 mg/dL — ABNORMAL HIGH (ref 65–99)
HCT: 42 % (ref 39.0–52.0)
Hemoglobin: 14.3 g/dL (ref 13.0–17.0)
Potassium: 4.5 mmol/L (ref 3.5–5.1)
Sodium: 145 mmol/L (ref 135–145)
TCO2: 29 mmol/L (ref 0–100)

## 2017-06-30 LAB — COMPREHENSIVE METABOLIC PANEL
ALT: 20 U/L (ref 17–63)
AST: 24 U/L (ref 15–41)
Albumin: 4.3 g/dL (ref 3.5–5.0)
Alkaline Phosphatase: 86 U/L (ref 38–126)
Anion gap: 10 (ref 5–15)
BUN: 17 mg/dL (ref 6–20)
CO2: 29 mmol/L (ref 22–32)
Calcium: 9.9 mg/dL (ref 8.9–10.3)
Chloride: 107 mmol/L (ref 101–111)
Creatinine, Ser: 1.74 mg/dL — ABNORMAL HIGH (ref 0.61–1.24)
GFR calc Af Amer: 46 mL/min — ABNORMAL LOW (ref >60)
GFR calc non Af Amer: 40 mL/min — ABNORMAL LOW (ref >60)
Glucose, Bld: 125 mg/dL — ABNORMAL HIGH (ref 65–99)
Potassium: 4.3 mmol/L (ref 3.5–5.1)
Sodium: 146 mmol/L — ABNORMAL HIGH (ref 135–145)
Total Bilirubin: 0.8 mg/dL (ref 0.3–1.2)
Total Protein: 7.9 g/dL (ref 6.5–8.1)

## 2017-06-30 LAB — URINALYSIS, ROUTINE W REFLEX MICROSCOPIC
Bacteria, UA: NONE SEEN
Bilirubin Urine: NEGATIVE
Glucose, UA: NEGATIVE mg/dL
Hgb urine dipstick: NEGATIVE
Ketones, ur: NEGATIVE mg/dL
Leukocytes, UA: NEGATIVE
Nitrite: NEGATIVE
Protein, ur: 30 mg/dL — AB
Specific Gravity, Urine: 1.018 (ref 1.005–1.030)
pH: 5 (ref 5.0–8.0)

## 2017-06-30 LAB — ETHANOL: Alcohol, Ethyl (B): <5 mg/dL (ref <5)

## 2017-06-30 LAB — RAPID URINE DRUG SCREEN, HOSP PERFORMED
Amphetamines: NOT DETECTED
Barbiturates: NOT DETECTED
Benzodiazepines: NOT DETECTED
Cocaine: NOT DETECTED
Opiates: POSITIVE — AB
Tetrahydrocannabinol: NOT DETECTED

## 2017-06-30 LAB — I-STAT TROPONIN, ED: Troponin i, poc: 0 ng/mL (ref 0.00–0.08)

## 2017-06-30 LAB — CK: Total CK: 105 U/L (ref 49–397)

## 2017-06-30 LAB — BLOOD GAS, ARTERIAL
Acid-Base Excess: 3.5 mmol/L — ABNORMAL HIGH (ref 0.0–2.0)
Bicarbonate: 25.9 mmol/L (ref 20.0–28.0)
Drawn by: 23534
O2 Content: 2.5 L/min
O2 Saturation: 89.5 %
pCO2 arterial: 61.4 mmHg — ABNORMAL HIGH (ref 32.0–48.0)
pH, Arterial: 7.301 — ABNORMAL LOW (ref 7.350–7.450)
pO2, Arterial: 65.1 mmHg — ABNORMAL LOW (ref 83.0–108.0)

## 2017-06-30 LAB — I-STAT CG4 LACTIC ACID, ED: Lactic Acid, Venous: 2.19 mmol/L (ref 0.5–1.9)

## 2017-06-30 LAB — CBG MONITORING, ED: Glucose-Capillary: 113 mg/dL — ABNORMAL HIGH (ref 65–99)

## 2017-06-30 MED ORDER — SODIUM CHLORIDE 0.9 % IV SOLN
Freq: Once | INTRAVENOUS | Status: AC
  Administered 2017-06-30: 20:00:00 via INTRAVENOUS

## 2017-06-30 MED ORDER — NALOXONE HCL 2 MG/2ML IJ SOSY
PREFILLED_SYRINGE | INTRAMUSCULAR | Status: AC
Start: 2017-06-30 — End: ?
  Filled 2017-06-30: qty 4

## 2017-06-30 MED ORDER — LORAZEPAM 2 MG/ML IJ SOLN
0.5000 mg | Freq: Once | INTRAMUSCULAR | Status: DC
Start: 2017-06-30 — End: 2017-06-30

## 2017-06-30 MED ORDER — SODIUM CHLORIDE 0.9% FLUSH
3.0000 mL | Freq: Two times a day (BID) | INTRAVENOUS | Status: DC
  Administered 2017-07-01 (×2): 3 mL via INTRAVENOUS

## 2017-06-30 MED ORDER — ONDANSETRON HCL 4 MG/2ML IJ SOLN
4.0000 mg | Freq: Once | INTRAMUSCULAR | Status: AC
Start: 2017-06-30 — End: 2017-06-30
  Administered 2017-06-30: 4 mg via INTRAVENOUS

## 2017-06-30 MED ORDER — FENTANYL CITRATE (PF) 100 MCG/2ML IJ SOLN
100.0000 ug | Freq: Once | INTRAMUSCULAR | Status: AC
  Administered 2017-06-30: 100 ug via INTRAVENOUS

## 2017-06-30 MED ORDER — LORAZEPAM 2 MG/ML IJ SOLN
INTRAMUSCULAR | Status: AC
  Administered 2017-06-30: 1 mg via INTRAVENOUS
  Filled 2017-06-30: qty 1

## 2017-06-30 MED ORDER — NALOXONE HCL 2 MG/2ML IJ SOSY
PREFILLED_SYRINGE | INTRAMUSCULAR | Status: AC
  Administered 2017-06-30: 2 mg via INTRAVENOUS
  Filled 2017-06-30: qty 2

## 2017-06-30 MED ORDER — FENTANYL CITRATE (PF) 100 MCG/2ML IJ SOLN
75.0000 ug | Freq: Once | INTRAMUSCULAR | Status: AC
  Administered 2017-06-30: 75 ug via INTRAVENOUS

## 2017-06-30 MED ORDER — HYDROMORPHONE HCL 1 MG/ML IJ SOLN
1.0000 mg | Freq: Once | INTRAMUSCULAR | Status: AC
  Administered 2017-06-30: 1 mg via INTRAVENOUS
  Filled 2017-06-30: qty 1

## 2017-06-30 MED ORDER — SODIUM CHLORIDE 0.9 % IV BOLUS (SEPSIS)
2000.0000 mL | Freq: Once | INTRAVENOUS | Status: AC
  Administered 2017-06-30: 2000 mL via INTRAVENOUS

## 2017-06-30 MED ORDER — NALOXONE HCL 2 MG/2ML IJ SOSY
2.0000 mg | PREFILLED_SYRINGE | Freq: Once | INTRAMUSCULAR | Status: AC
  Administered 2017-06-30: 2 mg via INTRAVENOUS

## 2017-06-30 MED ORDER — ONDANSETRON HCL 4 MG/2ML IJ SOLN: 4.0000 mg | Freq: Four times a day (QID) | INTRAMUSCULAR | Status: DC | PRN

## 2017-06-30 MED ORDER — ONDANSETRON HCL 4 MG PO TABS: 4.0000 mg | ORAL_TABLET | Freq: Four times a day (QID) | ORAL | Status: DC | PRN

## 2017-06-30 MED ORDER — NALOXONE HCL 0.4 MG/ML IJ SOLN
0.4000 mg | Freq: Once | INTRAMUSCULAR | Status: AC
  Administered 2017-06-30: 0.4 mg via INTRAVENOUS
  Filled 2017-06-30: qty 1

## 2017-06-30 MED ORDER — LORAZEPAM 2 MG/ML IJ SOLN
1.0000 mg | Freq: Once | INTRAMUSCULAR | Status: AC
  Administered 2017-06-30: 1 mg via INTRAVENOUS

## 2017-06-30 MED ORDER — NALOXONE HCL 2 MG/2ML IJ SOSY
0.2500 mg/h | PREFILLED_SYRINGE | INTRAVENOUS | Status: DC
  Administered 2017-06-30: 0.25 mg/h via INTRAVENOUS
  Filled 2017-06-30: qty 4

## 2017-06-30 MED ORDER — HEPARIN SODIUM (PORCINE) 5000 UNIT/ML IJ SOLN
5000.0000 [IU] | Freq: Three times a day (TID) | INTRAMUSCULAR | Status: DC
  Administered 2017-07-01 – 2017-07-02 (×4): 5000 [IU] via SUBCUTANEOUS
  Filled 2017-06-30 (×4): qty 1

## 2017-06-30 MED ORDER — HALOPERIDOL LACTATE 5 MG/ML IJ SOLN
2.0000 mg | Freq: Once | INTRAMUSCULAR | Status: AC
  Administered 2017-06-30: 2 mg via INTRAVENOUS

## 2017-06-30 MED ORDER — DEXTROSE-NACL 5-0.9 % IV SOLN
INTRAVENOUS | Status: DC
  Administered 2017-06-30 – 2017-07-02 (×4): via INTRAVENOUS

## 2017-06-30 MED ORDER — HALOPERIDOL LACTATE 5 MG/ML IJ SOLN
INTRAMUSCULAR | Status: AC
  Filled 2017-06-30: qty 1

## 2017-06-30 MED ORDER — SODIUM CHLORIDE 0.9 % IV SOLN
INTRAVENOUS | Status: DC
  Administered 2017-06-30: 1000 mL via INTRAVENOUS

## 2017-06-30 MED ORDER — FENTANYL CITRATE (PF) 100 MCG/2ML IJ SOLN
INTRAMUSCULAR | Status: AC
  Filled 2017-06-30: qty 2

## 2017-06-30 MED ORDER — LORAZEPAM 2 MG/ML IJ SOLN
0.5000 mg | INTRAMUSCULAR | Status: DC | PRN
  Administered 2017-06-30: 0.5 mg via INTRAVENOUS
  Filled 2017-06-30 (×2): qty 1

## 2017-06-30 MED ORDER — HYDROMORPHONE HCL 1 MG/ML IJ SOLN
INTRAMUSCULAR | Status: AC
  Administered 2017-06-30: 1 mg via INTRAVENOUS
  Filled 2017-06-30: qty 1

## 2017-06-30 MED ORDER — FENTANYL CITRATE (PF) 100 MCG/2ML IJ SOLN
INTRAMUSCULAR | Status: AC
  Administered 2017-06-30: 75 ug via INTRAVENOUS
  Filled 2017-06-30: qty 2

## 2017-06-30 MED ORDER — ONDANSETRON HCL 4 MG/2ML IJ SOLN
INTRAMUSCULAR | Status: AC
  Filled 2017-06-30: qty 2

## 2017-06-30 NOTE — ED Notes (Signed)
Patient now alert and answering questions at this time.

## 2017-06-30 NOTE — ED Notes (Addendum)
Pt flailing and swinging arms and legs, moaning and groaning. Skin is diaphoretic. Pt eyes are closed. Pt responds in short answers when asked questions.

## 2017-06-30 NOTE — ED Notes (Signed)
Nasal trump pulled out by patient.

## 2017-06-30 NOTE — ED Notes (Addendum)
Pt remains agitated swinging and flailing arms/legs. Dr Truman Hayward aware- new orders received.

## 2017-06-30 NOTE — ED Notes (Addendum)
Narcan drip held due to severe aggitation and withdrawel symptoms- Dr Truman Hayward gave verbal order and is at bedside. Pt flailing and swinging arms and legs. Shirlean Mylar, NT at bedside for safety. This nurse at bedside as well. Family at bedside.
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
## 2017-06-30 NOTE — ED Provider Notes (Signed)
[facility] DEPT Provider Note   CSN: [id number] Arrival date & time: 06/30/17  1647     History   Chief Complaint Chief Complaint  Patient presents with  . Loss of Consciousness    HPI Alexander Hatfield is a 61 y.o. male.  [place] by EMS [place]. Patient apparently had just finished playing 18 holes of golf. His cough mates stated that he had been sweating profusely. Throughout the round. Following it he collapsed the twice syncopal event was taken into the clubhouse and then collapsed again. EMS was called. EMS noted that he was very groggy. Apparently no evidence of seizure-like activity. Shortly after patient arrived here he became very somnolent again. EMS was able to report the patient has a history of chronic back pain and is on chronic pain medicine.      Past Medical History:  Diagnosis Date  . Chronic back pain     There are no active problems to display for this patient.   Past Surgical History:  Procedure Laterality Date  . BACK SURGERY         Home Medications    Prior to Admission medications   Medication Sig Start Date End Date Taking? Authorizing Provider  amLODipine (NORVASC) 10 MG tablet Take 10 mg by mouth daily.   Yes [provider]  baclofen (LIORESAL) 20 MG tablet Take 20 mg by mouth 3 (three) times daily.   Yes [provider]  Cyanocobalamin (VITAMIN B 12 PO) Take 1 tablet by mouth daily.   Yes [provider]  furosemide (LASIX) 20 MG tablet Take 20 mg by mouth daily.   Yes [provider]  gabapentin (NEURONTIN) 300 MG capsule Take 300 mg by mouth 4 (four) times daily as needed.   Yes [provider]  losartan (COZAAR) 100 MG tablet Take 100 mg by mouth daily.   Yes [provider]  meloxicam (MOBIC) 15 MG tablet Take 15 mg by mouth daily.   Yes [provider]  oxycodone (ROXICODONE) 30 MG immediate release tablet Take 30-60 mg by mouth every 8 (eight) hours as  needed for pain.   Yes [provider]  potassium chloride SA (K-DUR,KLOR-CON) 20 MEQ tablet Take 1 tablet by mouth daily. 05/16/17  Yes [provider]    Family History History reviewed. No pertinent family history.  Social History Social History  Substance Use Topics  . Smoking status: Never Smoker  . Smokeless tobacco: Never Used  . Alcohol use No     Allergies   Iohexol   Review of Systems Review of Systems  Unable to perform ROS: Mental status change     Physical Exam Updated Vital Signs BP 118/89   Pulse (!) 114   Temp (!) 97.3 F (36.3 C) (Oral)   Resp 20   SpO2 97%   Physical Exam  Constitutional: He appears well-developed and well-nourished.  HENT:  Head: Normocephalic and atraumatic.  Mucous membranes dry.  Eyes: Pupils are equal, round, and reactive to light. Conjunctivae and EOM are normal.  Pupils pinpoint.  Cardiovascular: Normal rate, regular rhythm and normal heart sounds.   Pulmonary/Chest: Effort normal and breath sounds normal.  Abdominal: Soft. Bowel sounds are normal.  Musculoskeletal: Normal range of motion. He exhibits edema.  Neurological:  Patient very drowsy. Difficult to arouse. Was moving all 4 extremities  Skin: Skin is warm. He is diaphoretic.     ED Treatments / Results  Labs (all labs ordered are  listed, but only abnormal results are displayed) Labs Reviewed  CBC WITH DIFFERENTIAL/PLATELET - Abnormal; Notable for the following:       Result Value   WBC 16.1 (*)    Neutro Abs 12.5 (*)    Monocytes Absolute 1.1 (*)    All other components within normal limits  COMPREHENSIVE METABOLIC PANEL - Abnormal; Notable for the following:    Sodium 146 (*)    Glucose, Bld 125 (*)    Creatinine, Ser 1.74 (*)    GFR calc non Af Amer 40 (*)    GFR calc Af Amer 46 (*)    All other components within normal limits  RAPID URINE DRUG SCREEN, HOSP PERFORMED - Abnormal; Notable for the following:    Opiates POSITIVE (*)      All other components within normal limits  URINALYSIS, ROUTINE W REFLEX MICROSCOPIC - Abnormal; Notable for the following:    APPearance HAZY (*)    Protein, ur 30 (*)    Squamous Epithelial / LPF 0-5 (*)    All other components within normal limits  BLOOD GAS, ARTERIAL - Abnormal; Notable for the following:    pH, Arterial 7.301 (*)    pCO2 arterial 61.4 (*)    pO2, Arterial 65.1 (*)    Acid-Base Excess 3.5 (*)    All other components within normal limits  I-STAT CHEM 8, ED - Abnormal; Notable for the following:    Creatinine, Ser 1.80 (*)    Glucose, Bld 121 (*)    All other components within normal limits  I-STAT CG4 LACTIC ACID, ED - Abnormal; Notable for the following:    Lactic Acid, Venous 2.19 (*)    All other components within normal limits  CBG MONITORING, ED - Abnormal; Notable for the following:    Glucose-Capillary 113 (*)    All other components within normal limits  ETHANOL  CK  I-STAT TROPONIN, ED    EKG  EKG Interpretation  Date/Time:  Sunday June 30 2017 16:53:47 EDT Ventricular Rate:  83 PR Interval:    QRS Duration: 102 QT Interval:  379 QTC Calculation: 446 R Axis:   62 Text Interpretation:  Sinus rhythm Confirmed by Fredia Sorrow [phone]) on 06/30/2017 5:01:55 PM       Radiology Ct Head Wo Contrast  Result Date: 06/30/2017 CLINICAL DATA:  Altered level of consciousness EXAM: CT HEAD WITHOUT CONTRAST TECHNIQUE: Contiguous axial images were obtained from the base of the skull through the vertex without intravenous contrast. COMPARISON:  Mar 19, 2002 FINDINGS: Brain: The ventricles are normal in size and contour for age. There is no appreciable intracranial mass, hemorrhage, extra-axial fluid collection, or midline shift. There is mild patchy small vessel disease in the centra semiovale bilaterally. Elsewhere gray-white compartments appear normal. No acute infarct is demonstrated. Vascular: There is no hyperdense vessel. There are foci of  calcification in each carotid siphon region. Skull: The bony calvarium appears intact. Sinuses/Orbits: There is opacification in multiple ethmoid air cells bilaterally. There is mild mucosal thickening in each anterior sphenoid sinus. Frontal sinuses are aplastic. Visualized orbits appear symmetric bilaterally. Other: Mastoid air cells are clear. IMPRESSION: Mild patchy periventricular small vessel disease. No intracranial mass hemorrhage, or extra-axial fluid collection. No evident acute infarct. Foci of arterial vascular calcification noted. Opacification throughout multiple ethmoid air cells as well as mild mucosal thickening in each anterior sphenoid sinus. Electronically Signed   By: Lowella Grip III M.D.   On: 06/30/2017 18:04   Dg Chest [facility]  1 View  Result Date: 06/30/2017 CLINICAL DATA:  Syncope EXAM: PORTABLE CHEST 1 VIEW COMPARISON:  April 13, 2008 FINDINGS: There is no edema or consolidation. Heart is borderline enlarged with pulmonary vascularity within normal limits. No adenopathy. No bone lesions. There is aortic atherosclerosis. IMPRESSION: Cardiac enlargement. No edema or consolidation. There is aortic atherosclerosis. Aortic Atherosclerosis (ICD10-I70.0). Electronically Signed   By: Lowella Grip III M.D.   On: 06/30/2017 17:49    Procedures Procedures (including critical care time)  Medications Ordered in ED Medications  0.9 %  sodium chloride infusion ( Intravenous Stopped 06/30/17 1932)  naloxone (NARCAN) 4 mg in dextrose 5 % 250 mL infusion (not administered)  sodium chloride 0.9 % bolus 2,000 mL (0 mLs Intravenous Stopped 06/30/17 1759)  naloxone (NARCAN) injection 0.4 mg (0.4 mg Intravenous Given 06/30/17 1719)  naloxone [facility]) injection 0.4 mg (0.4 mg Intravenous Given 06/30/17 1754)  naloxone [facility]) injection 2 mg (2 mg Intravenous Given 06/30/17 1802)  fentaNYL (SUBLIMAZE) injection 100 mcg (100 mcg Intravenous Given 06/30/17 1808)  ondansetron (ZOFRAN) injection 4  mg (4 mg Intravenous Given 06/30/17 1811)  HYDROmorphone (DILAUDID) injection 1 mg (1 mg Intravenous Given 06/30/17 1819)  LORazepam (ATIVAN) injection 1 mg (1 mg Intravenous Given 06/30/17 1824)     Initial Impression / Assessment and Plan / ED Course  I have reviewed the triage vital signs and the nursing notes.  Pertinent labs & imaging results that were available during my care of the patient were reviewed by me and considered in my medical decision making (see chart for details).     Shortly after arrival patient became very somnolent. Difficult to get response appears started snoring. Cannot arouse him. Was for parent to intubate. Gait patient 0.4 mg of Narcan. We knew that he had a history of a chronic back problems. And that was on chronic pain medicine. The patient did respond somewhat delayed to the 0.4 mg of Narcan. Did wake up. We're able to have a conversation. Patient denied taking any additional medication. Patient denied any significant complaints. However he was not completely alert. Labs were done patient was being given fluids. Patient went over CT of head and chest x-ray are being completed without any acute findings. Patient came back from CT of the head which was without any acute findings. And became very somnolent again. Once again cannot arouse him. Gave him 0.4 mg of Narcan this time did not wake up. So he was given 2 mg of Narcan he did wake up but he went into withdrawal. Unfortunately this was a very painful experience for him. But he also was not very alert her able to follow commands while going through this. This eventually wore off after about the hour. Patient started to get more comfortable. Is still overweight. We originally planned when he came back from head CT to start him on a Narcan drip but he was too somnolent so we were giving the the additional Narcan which unfortunately threw him into withdrawal. Patient was given some Ativan. There was no obvious seizure  activity. Patient now more awake and conversing. Narcan drip is on standby.  Rest of his workup without any significant findings other than his creatinine was elevated. Urine drug screen positive only for opiates which makes sense. No other significant electrolyte abnormalities. Initial troponin was negative EKG without acute changes.  I discussed with the hospitalist. Patient will be started on Narcan drip and will go to the ICU for admission.  Final Clinical Impressions(s) /  ED Diagnoses   Final diagnoses:  Syncope and collapse  Altered mental status, unspecified altered mental status type  Accidental drug overdose, initial encounter  Chronic bilateral back pain, unspecified back location    New Prescriptions New Prescriptions   No medications on file     Fredia Sorrow, MD 06/30/17 1950

## 2017-06-30 NOTE — ED Notes (Addendum)
Pt breathing equal and rate is normal, pt suction and unable to swallow secretions at this time. Pt has no gag reflex when suctioned. Pt is unresponsive to verbal and painful stimuli at this time. Vitals are stable at this time.  Dr Truman Hayward aware of situation and events.

## 2017-06-30 NOTE — ED Notes (Addendum)
Dr Truman Hayward at bedside. Pt two sisters at bedside. Shirlean Mylar, NT at bedside for safety.

## 2017-06-30 NOTE — ED Notes (Signed)
Patient still thrashing around in bed-EDP still in room, further order given.

## 2017-06-30 NOTE — ED Notes (Signed)
Seizure pads placed on bed

## 2017-06-30 NOTE — ED Notes (Signed)
Patient now "thrashing" around in bed, moaning and yelling "oh my back." EDP aware and in room.

## 2017-06-30 NOTE — H&P (Signed)
History and Physical    KEREM GILMER PZW:[id number] DOB: September 21, 1956 DOA: 06/30/2017  PCP: Asencion Noble, MD .  He said his PCP is Dr Luan Pulling.  Patient coming from:  Home.    Chief Complaint:   Unresponsive.   HPI: Alexander Hatfield is an 61 y.o. male with hx of chronic low back pain, s/p failed back surgeries ( 16 times), on Oxycodone 30mg  to 60 mg TID, hx of HTN, presented to the ER unresponsive.  He was playing golf today, experienced diaphoresis, and became unresponsive.  In the ER, he was given O.4mg  of Narcan, with good response.  Subsequently, he became lethargic again, and was given 2mg  of Narcan, as he was not immediately reponsive to it.  With this, he became agitated and combative, with muscle spasm and twitches.  He was then given IV Fentanyl at 130mcg, along with Ativan, and hospitalist was asked to admit him for accidental narcotic overdose. He had a negative head CT, normal LFTs, and Cr of 1.8.  EKG showed no prolonged QTc, and no acute processes.    ED Course:  See above.  Rewiew of Systems:  Constitutional: Negative for malaise, fever and chills. No significant weight loss or weight gain Eyes: Negative for eye pain, redness and discharge, diplopia, visual changes, or flashes of light. ENMT: Negative for ear pain, hoarseness, nasal congestion, sinus pressure and sore throat. No headaches; tinnitus, drooling, or problem swallowing. Cardiovascular: Negative for chest pain, palpitations, diaphoresis, dyspnea and peripheral edema. ; No orthopnea, PND Respiratory: Negative for cough, hemoptysis, wheezing and stridor. No pleuritic chestpain. Gastrointestinal: Negative for diarrhea, constipation,  melena, blood in stool, hematemesis, jaundice and rectal bleeding.    Genitourinary: Negative for frequency, dysuria, incontinence,flank pain and hematuria; Musculoskeletal: Negative for back pain and neck pain. Negative for swelling and trauma.;  Skin: . Negative for pruritus, rash, abrasions,  bruising and skin lesion.; ulcerations Neuro: Negative for headache, lightheadedness and neck stiffness. Negative for weakness, extremity weakness, burning feet, involuntary movement, seizure and syncope.  Psych: negative for  insomnia, tearfulness, panic attacks, hallucinations, paranoia, suicidal or homicidal ideation    Past Medical History:  Diagnosis Date  . Chronic back pain     Past Surgical History:  Procedure Laterality Date  . BACK SURGERY       reports that he has never smoked. He has never used smokeless tobacco. He reports that he does not drink alcohol or use drugs.  Allergies  Allergen Reactions  . Iohexol      Desc: PT STATES THAT WHEN GIVEN THE CONTRAST HIS LEGS BURN AND HE PASSES OUT, COMA FOR 7DAY. DR Thornton Papas WOULD NOT PRE MEDICATE DUE TO SEVERITY OF REACTION, STUDY WAS CX     History reviewed. No pertinent family history.   Prior to Admission medications   Medication Sig Start Date End Date Taking? Authorizing Provider  amLODipine (NORVASC) 10 MG tablet Take 10 mg by mouth daily.   Yes [provider]  baclofen (LIORESAL) 20 MG tablet Take 20 mg by mouth 3 (three) times daily.   Yes [provider]  Cyanocobalamin (VITAMIN B 12 PO) Take 1 tablet by mouth daily.   Yes [provider]  furosemide (LASIX) 20 MG tablet Take 20 mg by mouth daily.   Yes [provider]  gabapentin (NEURONTIN) 300 MG capsule Take 300 mg by mouth 4 (four) times daily as needed.   Yes [provider]  losartan (COZAAR) 100 MG tablet Take 100 mg by mouth  daily.   Yes [provider]  meloxicam (MOBIC) 15 MG tablet Take 15 mg by mouth daily.   Yes [provider]  oxycodone (ROXICODONE) 30 MG immediate release tablet Take 30-60 mg by mouth every 8 (eight) hours as needed for pain.   Yes [provider]  potassium chloride SA (K-DUR,KLOR-CON) 20 MEQ tablet Take 1 tablet by mouth daily. 05/16/17  Yes [provider]    Physical Exam: Vitals:   06/30/17 1900 06/30/17 1915 06/30/17 1930 06/30/17 1945  BP: 118/89 138/74 116/64 131/63  Pulse: (!) 114 (!) 107 97 92  Resp: 20 20 18 18   Temp:      TempSrc:      SpO2: 97% (!) 89% (!) 85% 94%      Constitutional: NAD, calm, comfortable Vitals:   06/30/17 1900 06/30/17 1915 06/30/17 1930 06/30/17 1945  BP: 118/89 138/74 116/64 131/63  Pulse: (!) 114 (!) 107 97 92  Resp: 20 20 18 18   Temp:      TempSrc:      SpO2: 97% (!) 89% (!) 85% 94%   Eyes: PERRL, lids and conjunctivae normal ENMT: Mucous membranes are moist. Posterior pharynx clear of any exudate or lesions.Normal dentition.  Neck: normal, supple, no masses, no thyromegaly Respiratory: clear to auscultation bilaterally, no wheezing, no crackles. Normal respiratory effort. No accessory muscle use.  Cardiovascular: Regular rate and rhythm, no murmurs / rubs / gallops. No extremity edema. 2+ pedal pulses. No carotid bruits.  Abdomen: no tenderness, no masses palpated. No hepatosplenomegaly. Bowel sounds positive.  Musculoskeletal: no clubbing / cyanosis. No joint deformity upper and lower extremities. Good ROM, no contractures. Normal muscle tone.  Skin: no rashes, lesions, ulcers. No induration Neurologic: CN 2-12 grossly intact. Sensation intact, DTR normal. Strength 5/5 in all 4.  Psychiatric: Normal judgment and insight. Alert and oriented x 3. Normal mood.     Labs on Admission: I have personally reviewed following labs and imaging studies  CBC:  Recent Labs Lab 06/30/17 1700 06/30/17 1705  WBC 16.1*  --   NEUTROABS 12.5*  --   HGB 13.9 14.3  HCT 42.6 42.0  MCV 93.0  --   PLT 233  --    Basic Metabolic Panel:  Recent Labs Lab 06/30/17 1700 06/30/17 1705  NA 146* 145  K 4.3 4.5  CL 107 108  CO2 29  --   GLUCOSE 125* 121*  BUN 17 18  CREATININE 1.74* 1.80*  CALCIUM 9.9  --    GFR: CrCl cannot be calculated (Unknown ideal weight.). Liver Function  Tests:  Recent Labs Lab 06/30/17 1700  AST 24  ALT 20  ALKPHOS 86  BILITOT 0.8  PROT 7.9  ALBUMIN 4.3   Cardiac Enzymes:  Recent Labs Lab 06/30/17 1700  CKTOTAL 105   CBG:  Recent Labs Lab 06/30/17 1700  GLUCAP 113*   Urine analysis:    Component Value Date/Time   COLORURINE YELLOW 06/30/2017 1715   APPEARANCEUR HAZY (A) 06/30/2017 1715   LABSPEC 1.018 06/30/2017 1715   PHURINE 5.0 06/30/2017 1715   GLUCOSEU NEGATIVE 06/30/2017 1715   HGBUR NEGATIVE 06/30/2017 1715   [place] 06/30/2017 1715   [place] 06/30/2017 1715   PROTEINUR 30 (A) 06/30/2017 1715   NITRITE NEGATIVE 06/30/2017 1715   LEUKOCYTESUR NEGATIVE 06/30/2017 1715   Radiological Exams on Admission: Ct Head Wo Contrast  Result Date: 06/30/2017 CLINICAL DATA:  Altered level of consciousness EXAM: CT HEAD WITHOUT CONTRAST TECHNIQUE: Contiguous axial  images were obtained from the base of the skull through the vertex without intravenous contrast. COMPARISON:  Mar 19, 2002 FINDINGS: Brain: The ventricles are normal in size and contour for age. There is no appreciable intracranial mass, hemorrhage, extra-axial fluid collection, or midline shift. There is mild patchy small vessel disease in the centra semiovale bilaterally. Elsewhere gray-white compartments appear normal. No acute infarct is demonstrated. Vascular: There is no hyperdense vessel. There are foci of calcification in each carotid siphon region. Skull: The bony calvarium appears intact. Sinuses/Orbits: There is opacification in multiple ethmoid air cells bilaterally. There is mild mucosal thickening in each anterior sphenoid sinus. Frontal sinuses are aplastic. Visualized orbits appear symmetric bilaterally. Other: Mastoid air cells are clear. IMPRESSION: Mild patchy periventricular small vessel disease. No intracranial mass hemorrhage, or extra-axial fluid collection. No evident acute infarct. Foci of arterial vascular calcification  noted. Opacification throughout multiple ethmoid air cells as well as mild mucosal thickening in each anterior sphenoid sinus. Electronically Signed   By: Lowella Grip III M.D.   On: 06/30/2017 18:04   Dg Chest Port 1 View  Result Date: 06/30/2017 CLINICAL DATA:  Syncope EXAM: PORTABLE CHEST 1 VIEW COMPARISON:  April 13, 2008 FINDINGS: There is no edema or consolidation. Heart is borderline enlarged with pulmonary vascularity within normal limits. No adenopathy. No bone lesions. There is aortic atherosclerosis. IMPRESSION: Cardiac enlargement. No edema or consolidation. There is aortic atherosclerosis. Aortic Atherosclerosis (ICD10-I70.0). Electronically Signed   By: Lowella Grip III M.D.   On: 06/30/2017 17:49    EKG: Independently reviewed.   Assessment/Plan    Narcotic overdose   HTN   Chronic back pain.   PLAN:   Accidental narcotic overdose:  Will use narcan drip at low dose if required.  If he becomes agitated, will use IV Benzo, or IV Haldol.  For quick result, Fentanyl 49mcg had worked well for him.  If required, will intubate him.    HTN:  He has labile BP with the use of narcan.  Will hold BP meds.  Use Narcan if respiratory suppression or hypotension.   DVT prophylaxis subQ heparin.  Code Status: FULL CODE.  Family Communication: 2 sisters at bedside.  Disposition Plan: Home.  Consults called: None. Admission status: inpatient.    Jeannifer Drakeford MD FACP. [facility]  If 7PM-7AM, please contact night-coverage [email or web address] Password [email or web address]  06/30/2017, 8:00 PM

## 2017-06-30 NOTE — ED Notes (Signed)
Patient still difficult to arouse. Airway patent and VS stable. EDP in room. Patient to have another dose of Narcan.

## 2017-06-30 NOTE — ED Notes (Signed)
Pt alert and oriented at this time. Pt does doze off at times, however is easily aroused by verbal stimuli. Pt airway is patent and pt is able to maintain saliva at this time.  Pt still has twitches at times, however they have decreased tremendously. Pt family remains at bedside.

## 2017-06-30 NOTE — ED Notes (Signed)
Dr. Lee at bedside.

## 2017-06-30 NOTE — ED Notes (Signed)
Patient back from CT. Patient lethargic again and difficult to arouse. EDP in room-order for another dose of Narcan given.

## 2017-06-30 NOTE — ED Notes (Signed)
Patient becoming moe lethargic and difficult to arouse. Nasal trump put in to keep airway open. EDP aware and in room.

## 2017-06-30 NOTE — ED Notes (Signed)
Robin, NT at bedside assisting with holding pt head upright as to protect airway. Pt unable to hold neck upright at 45 degree angle in bed. Visible and equal chest rise and fall noted.

## 2017-07-01 LAB — CBC
HCT: 39.7 % (ref 39.0–52.0)
Hemoglobin: 12.5 g/dL — ABNORMAL LOW (ref 13.0–17.0)
MCH: 30 pg (ref 26.0–34.0)
MCHC: 31.5 g/dL (ref 30.0–36.0)
MCV: 95.4 fL (ref 78.0–100.0)
Platelets: 243 10*3/uL (ref 150–400)
RBC: 4.16 MIL/uL — ABNORMAL LOW (ref 4.22–5.81)
RDW: 13.8 % (ref 11.5–15.5)
WBC: 9.5 10*3/uL (ref 4.0–10.5)

## 2017-07-01 LAB — COMPREHENSIVE METABOLIC PANEL
ALT: 19 U/L (ref 17–63)
AST: 19 U/L (ref 15–41)
Albumin: 3.5 g/dL (ref 3.5–5.0)
Alkaline Phosphatase: 74 U/L (ref 38–126)
Anion gap: 4 — ABNORMAL LOW (ref 5–15)
BUN: 13 mg/dL (ref 6–20)
CO2: 32 mmol/L (ref 22–32)
Calcium: 8.6 mg/dL — ABNORMAL LOW (ref 8.9–10.3)
Chloride: 108 mmol/L (ref 101–111)
Creatinine, Ser: 0.92 mg/dL (ref 0.61–1.24)
GFR calc Af Amer: >60 mL/min (ref >60)
GFR calc non Af Amer: >60 mL/min (ref >60)
Glucose, Bld: 115 mg/dL — ABNORMAL HIGH (ref 65–99)
Potassium: 4.6 mmol/L (ref 3.5–5.1)
Sodium: 144 mmol/L (ref 135–145)
Total Bilirubin: 0.4 mg/dL (ref 0.3–1.2)
Total Protein: 6.7 g/dL (ref 6.5–8.1)

## 2017-07-01 LAB — TSH: TSH: 0.868 u[IU]/mL (ref 0.350–4.500)

## 2017-07-01 LAB — MRSA PCR SCREENING: MRSA by PCR: NEGATIVE

## 2017-07-01 LAB — TROPONIN I
Troponin I: 0.03 ng/mL (ref <0.03)
Troponin I: 0.03 ng/mL (ref <0.03)
Troponin I: 0.03 ng/mL (ref <0.03)

## 2017-07-01 MED ORDER — GABAPENTIN 300 MG PO CAPS
300.0000 mg | ORAL_CAPSULE | Freq: Three times a day (TID) | ORAL | Status: DC
  Administered 2017-07-01: 300 mg via ORAL
  Filled 2017-07-01: qty 1

## 2017-07-01 NOTE — Progress Notes (Signed)
Subjective: He was sleeping soundly when I came into the room and he has had witnessed apnea. When he was aroused he says that his problem is that he has not been sleeping. His wife has kept him up and apparently giving her breathing treatments but I'm not quite sure about that history. He has sleep apnea. He tried's CPAP previously and did not tolerate it. He says he thinks he simply fell asleep. He is adamant that he did not take any increase in his chronic narcotics for chronic back pain. BiPAP was attempted last night but he pulled the mask off.  Objective: Vital signs in last 24 hours: Temp:  [97.3 F (36.3 C)-98.4 F (36.9 C)] 98 F (36.7 C) (08/20 0400) Pulse Rate:  [57-114] 68 (08/20 0600) Resp:  [12-25] 25 (08/20 0600) BP: (88-159)/(48-92) 133/74 (08/20 0600) SpO2:  [53 %-100 %] 99 % (08/20 0600) Weight:  [139.4 kg (307 lb 5.1 oz)] 139.4 kg (307 lb 5.1 oz) (08/20 0500) Weight change:     Intake/Output from previous day: 08/19 0701 - 08/20 0700 In: 5707.4 [I.V.:3707.4; IV Piggyback:2000] Out: 2700 [Urine:2700]  PHYSICAL EXAM General appearance: alert, cooperative, no distress and morbidly obese Resp: clear to auscultation bilaterally Cardio: regular rate and rhythm, S1, S2 normal, no murmur, click, rub or gallop GI: soft, non-tender; bowel sounds normal; no masses,  no organomegaly Extremities: extremities normal, atraumatic, no cyanosis or edema Skin warm and dry  Lab Results:  Results for orders placed or performed during the hospital encounter of 06/30/17 (from the past 48 hour(s))  CBC with Differential/Platelet     Status: Abnormal   Collection Time: 06/30/17  5:00 PM  Result Value Ref Range   WBC 16.1 (H) 4.0 - 10.5 K/uL   RBC 4.58 4.22 - 5.81 MIL/uL   Hemoglobin 13.9 13.0 - 17.0 g/dL   HCT 42.6 39.0 - 52.0 %   MCV 93.0 78.0 - 100.0 fL   MCH 30.3 26.0 - 34.0 pg   MCHC 32.6 30.0 - 36.0 g/dL   RDW 13.5 11.5 - 15.5 %   Platelets 233 150 - 400 K/uL   Neutrophils  Relative % 78 %   Neutro Abs 12.5 (H) 1.7 - 7.7 K/uL   Lymphocytes Relative 13 %   Lymphs Abs 2.2 0.7 - 4.0 K/uL   Monocytes Relative 7 %   Monocytes Absolute 1.1 (H) 0.1 - 1.0 K/uL   Eosinophils Relative 2 %   Eosinophils Absolute 0.3 0.0 - 0.7 K/uL   Basophils Relative 0 %   Basophils Absolute 0.1 0.0 - 0.1 K/uL  Comprehensive metabolic panel     Status: Abnormal   Collection Time: 06/30/17  5:00 PM  Result Value Ref Range   Sodium 146 (H) 135 - 145 mmol/L   Potassium 4.3 3.5 - 5.1 mmol/L   Chloride 107 101 - 111 mmol/L   CO2 29 22 - 32 mmol/L   Glucose, Bld 125 (H) 65 - 99 mg/dL   BUN 17 6 - 20 mg/dL   Creatinine, Ser 1.74 (H) 0.61 - 1.24 mg/dL   Calcium 9.9 8.9 - 10.3 mg/dL   Total Protein 7.9 6.5 - 8.1 g/dL   Albumin 4.3 3.5 - 5.0 g/dL   AST 24 15 - 41 U/L   ALT 20 17 - 63 U/L   Alkaline Phosphatase 86 38 - 126 U/L   Total Bilirubin 0.8 0.3 - 1.2 mg/dL   GFR calc non Af Amer 40 (L) >60 mL/min   GFR calc  Af Amer 46 (L) >60 mL/min    Comment: (NOTE) The eGFR has been calculated using the CKD EPI equation. This calculation has not been validated in all clinical situations. eGFR's persistently <60 mL/min signify possible Chronic Kidney Disease.    Anion gap 10 5 - 15  Ethanol     Status: None   Collection Time: 06/30/17  5:00 PM  Result Value Ref Range   Alcohol, Ethyl (B) <5 <5 mg/dL    Comment:        LOWEST DETECTABLE LIMIT FOR SERUM ALCOHOL IS 5 mg/dL FOR MEDICAL PURPOSES ONLY   CK     Status: None   Collection Time: 06/30/17  5:00 PM  Result Value Ref Range   Total CK 105 49 - 397 U/L  CBG monitoring, ED     Status: Abnormal   Collection Time: 06/30/17  5:00 PM  Result Value Ref Range   Glucose-Capillary 113 (H) 65 - 99 mg/dL  I-stat troponin, ED     Status: None   Collection Time: 06/30/17  5:01 PM  Result Value Ref Range   Troponin i, poc 0.00 0.00 - 0.08 ng/mL   Comment 3            Comment: Due to the release kinetics of cTnI, a negative result  within the first hours of the onset of symptoms does not rule out myocardial infarction with certainty. If myocardial infarction is still suspected, repeat the test at appropriate intervals.   Blood gas, arterial ([facility] & [facility] ONLY)     Status: Abnormal   Collection Time: 06/30/17  5:01 PM  Result Value Ref Range   O2 Content 2.5 L/min   Delivery systems NASAL CANNULA    pH, Arterial 7.301 (L) 7.350 - 7.450   pCO2 arterial 61.4 (H) 32.0 - 48.0 mmHg   pO2, Arterial 65.1 (L) 83.0 - 108.0 mmHg   Bicarbonate 25.9 20.0 - 28.0 mmol/L   Acid-Base Excess 3.5 (H) 0.0 - 2.0 mmol/L   O2 Saturation 89.5 %   Collection site RIGHT RADIAL    Drawn by [phone]    Sample type ARTERIAL    Allens test (pass/fail) PASS PASS  I-stat Chem 8, ED     Status: Abnormal   Collection Time: 06/30/17  5:05 PM  Result Value Ref Range   Sodium 145 135 - 145 mmol/L   Potassium 4.5 3.5 - 5.1 mmol/L   Chloride 108 101 - 111 mmol/L   BUN 18 6 - 20 mg/dL   Creatinine, Ser 1.80 (H) 0.61 - 1.24 mg/dL   Glucose, Bld 121 (H) 65 - 99 mg/dL   Calcium, Ion 1.17 1.15 - 1.40 mmol/L   TCO2 29 0 - 100 mmol/L   Hemoglobin 14.3 13.0 - 17.0 g/dL   HCT 42.0 39.0 - 52.0 %  I-Stat CG4 Lactic Acid, ED     Status: Abnormal   Collection Time: 06/30/17  5:10 PM  Result Value Ref Range   Lactic Acid, Venous 2.19 (HH) 0.5 - 1.9 mmol/L   Comment NOTIFIED PHYSICIAN   Rapid urine drug screen (hospital performed)     Status: Abnormal   Collection Time: 06/30/17  5:15 PM  Result Value Ref Range   Opiates POSITIVE (A) NONE DETECTED   Cocaine NONE DETECTED NONE DETECTED   Benzodiazepines NONE DETECTED NONE DETECTED   Amphetamines NONE DETECTED NONE DETECTED   Tetrahydrocannabinol NONE DETECTED NONE DETECTED   Barbiturates NONE DETECTED NONE DETECTED    Comment:  DRUG SCREEN FOR MEDICAL PURPOSES ONLY.  IF CONFIRMATION IS NEEDED FOR ANY PURPOSE, NOTIFY LAB WITHIN 5 DAYS.        LOWEST DETECTABLE LIMITS FOR URINE DRUG SCREEN Drug  Class       Cutoff (ng/mL) Amphetamine      1000 Barbiturate      200 Benzodiazepine   226 Tricyclics       333 Opiates          300 Cocaine          300 THC              50   Urinalysis, Routine w reflex microscopic     Status: Abnormal   Collection Time: 06/30/17  5:15 PM  Result Value Ref Range   Color, Urine YELLOW YELLOW   APPearance HAZY (A) CLEAR   Specific Gravity, Urine 1.018 1.005 - 1.030   pH 5.0 5.0 - 8.0   Glucose, UA NEGATIVE NEGATIVE mg/dL   Hgb urine dipstick NEGATIVE NEGATIVE   Bilirubin Urine NEGATIVE NEGATIVE   Ketones, ur NEGATIVE NEGATIVE mg/dL   Protein, ur 30 (A) NEGATIVE mg/dL   Nitrite NEGATIVE NEGATIVE   Leukocytes, UA NEGATIVE NEGATIVE   RBC / HPF 0-5 0 - 5 RBC/hpf   WBC, UA 0-5 0 - 5 WBC/hpf   Bacteria, UA NONE SEEN NONE SEEN   Squamous Epithelial / LPF 0-5 (A) NONE SEEN   Mucous PRESENT    Hyaline Casts, UA PRESENT    Granular Casts, UA PRESENT   Troponin I (q 6hr x 3)     Status: Abnormal   Collection Time: 07/01/17 12:15 AM  Result Value Ref Range   Troponin I 0.03 (HH) <0.03 ng/mL    Comment: CRITICAL RESULT CALLED TO, READ BACK BY AND VERIFIED WITH:  Ellerbe,J @ 0139 ON 07/01/17 BY JUW   TSH     Status: None   Collection Time: 07/01/17 12:15 AM  Result Value Ref Range   TSH 0.868 0.350 - 4.500 uIU/mL    Comment: Performed by a 3rd Generation assay with a functional sensitivity of <=0.01 uIU/mL.  Troponin I (q 6hr x 3)     Status: Abnormal   Collection Time: 07/01/17  5:06 AM  Result Value Ref Range   Troponin I 0.03 (HH) <0.03 ng/mL    Comment: CRITICAL VALUE NOTED.  VALUE IS CONSISTENT WITH PREVIOUSLY REPORTED AND CALLED VALUE.  Comprehensive metabolic panel     Status: Abnormal   Collection Time: 07/01/17  5:06 AM  Result Value Ref Range   Sodium 144 135 - 145 mmol/L   Potassium 4.6 3.5 - 5.1 mmol/L   Chloride 108 101 - 111 mmol/L   CO2 32 22 - 32 mmol/L   Glucose, Bld 115 (H) 65 - 99 mg/dL   BUN 13 6 - 20 mg/dL   Creatinine, Ser  0.92 0.61 - 1.24 mg/dL   Calcium 8.6 (L) 8.9 - 10.3 mg/dL   Total Protein 6.7 6.5 - 8.1 g/dL   Albumin 3.5 3.5 - 5.0 g/dL   AST 19 15 - 41 U/L   ALT 19 17 - 63 U/L   Alkaline Phosphatase 74 38 - 126 U/L   Total Bilirubin 0.4 0.3 - 1.2 mg/dL   GFR calc non Af Amer >60 >60 mL/min   GFR calc Af Amer >60 >60 mL/min    Comment: (NOTE) The eGFR has been calculated using the CKD EPI equation. This calculation has not been validated in all  clinical situations. eGFR's persistently <60 mL/min signify possible Chronic Kidney Disease.    Anion gap 4 (L) 5 - 15  CBC     Status: Abnormal   Collection Time: 07/01/17  5:06 AM  Result Value Ref Range   WBC 9.5 4.0 - 10.5 K/uL   RBC 4.16 (L) 4.22 - 5.81 MIL/uL   Hemoglobin 12.5 (L) 13.0 - 17.0 g/dL   HCT 39.7 39.0 - 52.0 %   MCV 95.4 78.0 - 100.0 fL   MCH 30.0 26.0 - 34.0 pg   MCHC 31.5 30.0 - 36.0 g/dL   RDW 13.8 11.5 - 15.5 %   Platelets 243 150 - 400 K/uL    ABGS  Recent Labs  06/30/17 1701 06/30/17 1705  PHART 7.301*  --   PO2ART 65.1*  --   TCO2  --  29  HCO3 25.9  --    CULTURES No results found for this or any previous visit (from the past 240 hour(s)). Studies/Results: Ct Head Wo Contrast  Result Date: 06/30/2017 CLINICAL DATA:  Altered level of consciousness EXAM: CT HEAD WITHOUT CONTRAST TECHNIQUE: Contiguous axial images were obtained from the base of the skull through the vertex without intravenous contrast. COMPARISON:  Mar 19, 2002 FINDINGS: Brain: The ventricles are normal in size and contour for age. There is no appreciable intracranial mass, hemorrhage, extra-axial fluid collection, or midline shift. There is mild patchy small vessel disease in the centra semiovale bilaterally. Elsewhere gray-white compartments appear normal. No acute infarct is demonstrated. Vascular: There is no hyperdense vessel. There are foci of calcification in each carotid siphon region. Skull: The bony calvarium appears intact. Sinuses/Orbits:  There is opacification in multiple ethmoid air cells bilaterally. There is mild mucosal thickening in each anterior sphenoid sinus. Frontal sinuses are aplastic. Visualized orbits appear symmetric bilaterally. Other: Mastoid air cells are clear. IMPRESSION: Mild patchy periventricular small vessel disease. No intracranial mass hemorrhage, or extra-axial fluid collection. No evident acute infarct. Foci of arterial vascular calcification noted. Opacification throughout multiple ethmoid air cells as well as mild mucosal thickening in each anterior sphenoid sinus. Electronically Signed   By: Lowella Grip III M.D.   On: 06/30/2017 18:04   Dg Chest Port 1 View  Result Date: 06/30/2017 CLINICAL DATA:  Syncope EXAM: PORTABLE CHEST 1 VIEW COMPARISON:  April 13, 2008 FINDINGS: There is no edema or consolidation. Heart is borderline enlarged with pulmonary vascularity within normal limits. No adenopathy. No bone lesions. There is aortic atherosclerosis. IMPRESSION: Cardiac enlargement. No edema or consolidation. There is aortic atherosclerosis. Aortic Atherosclerosis (ICD10-I70.0). Electronically Signed   By: Lowella Grip III M.D.   On: 06/30/2017 17:49    Medications:  Prior to Admission:  Prescriptions Prior to Admission  Medication Sig Dispense Refill Last Dose  . amLODipine (NORVASC) 10 MG tablet Take 10 mg by mouth daily.   06/29/2017 at Unknown time  . baclofen (LIORESAL) 20 MG tablet Take 20 mg by mouth 3 (three) times daily.   06/30/2017 at Unknown time  . Cyanocobalamin (VITAMIN B 12 PO) Take 1 tablet by mouth daily.   06/30/2017 at Unknown time  . furosemide (LASIX) 20 MG tablet Take 20 mg by mouth daily.   06/30/2017 at Unknown time  . gabapentin (NEURONTIN) 300 MG capsule Take 300 mg by mouth 4 (four) times daily as needed.   06/30/2017 at Unknown time  . losartan (COZAAR) 100 MG tablet Take 100 mg by mouth daily.   06/29/2017 at Unknown time  . meloxicam (  MOBIC) 15 MG tablet Take 15 mg by mouth  daily.   06/30/2017 at Unknown time  . oxycodone (ROXICODONE) 30 MG immediate release tablet Take 30-60 mg by mouth every 8 (eight) hours as needed for pain.   06/30/2017 at Unknown time  . potassium chloride SA (K-DUR,KLOR-CON) 20 MEQ tablet Take 1 tablet by mouth daily.   06/30/2017 at Unknown time   Scheduled: . heparin  5,000 Units Subcutaneous Q8H  . sodium chloride flush  3 mL Intravenous Q12H   Continuous: . dextrose 5 % and 0.9% NaCl 100 mL/hr at 06/30/17 2330  . naLOXone [facility]) adult infusion for OVERDOSE Stopped (06/30/17 2205)   IDC:VUDTHYHOO, ondansetron **OR** ondansetron (ZOFRAN) IV  Assesment:He had altered level of consciousness. He does have chronic narcotic use but he says he did not take any extra medication. He has sleep apnea and he says he has not been sleeping. He tried CPAP several years ago and did not tolerate it. I would like to see if we can try CPAP tonight and see if that will make a difference in his level of consciousness. Active Problems:   Narcotic overdose    Plan: Continue current treatments    LOS: 1 day   Delanda Bulluck L 07/01/2017, 7:39 AM

## 2017-07-01 NOTE — Progress Notes (Signed)
CRITICAL VALUE ALERT  Critical Value:  Trop 0.03 Date & Time Notied:  07/01/17 at [place] Provider Notified: [place] Comment Orders Received/Actions taken: no new orders

## 2017-07-02 DIAGNOSIS — I10 Essential (primary) hypertension: Secondary | ICD-10-CM | POA: Diagnosis present

## 2017-07-02 DIAGNOSIS — G4733 Obstructive sleep apnea (adult) (pediatric): Secondary | ICD-10-CM | POA: Diagnosis present

## 2017-07-02 DIAGNOSIS — M549 Dorsalgia, unspecified: Secondary | ICD-10-CM

## 2017-07-02 DIAGNOSIS — R55 Syncope and collapse: Secondary | ICD-10-CM | POA: Diagnosis present

## 2017-07-02 DIAGNOSIS — G8929 Other chronic pain: Secondary | ICD-10-CM | POA: Diagnosis present

## 2017-07-02 LAB — HIV ANTIBODY (ROUTINE TESTING W REFLEX): HIV Screen 4th Generation wRfx: NONREACTIVE

## 2017-07-02 NOTE — Progress Notes (Signed)
Patient being discharged first thing this morning per Dr. Luan Pulling. VSS, complaints of pain in back from bed which he says is normal for him d/t chronic back pain. No changed from previous assessment. Patient's sister is on the way to pick him up from hospital.

## 2017-07-02 NOTE — Progress Notes (Signed)
Subjective: He says he feels okay. He has no new complaints. His back is bothering him because she's been in the bed. He did not sleep last night so he did not try CPAP. He says he tried CPAP several years ago but was unable to tolerate it. He is willing to try again.  Objective: Vital signs in last 24 hours: Temp:  [98 F (36.7 C)-98.8 F (37.1 C)] 98.1 F (36.7 C) (08/21 0400) Pulse Rate:  [50-71] 50 (08/21 0813) Resp:  [12-28] 17 (08/21 0813) BP: (111-160)/(58-81) 160/81 (08/21 0813) SpO2:  [63 %-100 %] 63 % (08/21 0813) Weight:  [141.1 kg (311 lb 1.1 oz)] 141.1 kg (311 lb 1.1 oz) (08/21 0500) Weight change: 1.7 kg (3 lb 12 oz) Last BM Date: 06/29/17  Intake/Output from previous day: 08/20 0701 - 08/21 0700 In: 3142 [P.O.:930; I.V.:2212] Out: 4301 [Urine:4300; Stool:1]  PHYSICAL EXAM General appearance: alert, cooperative, no distress and morbidly obese Resp: clear to auscultation bilaterally Cardio: regular rate and rhythm, S1, S2 normal, no murmur, click, rub or gallop GI: soft, non-tender; bowel sounds normal; no masses,  no organomegaly Extremities: extremities normal, atraumatic, no cyanosis or edema Skin warm and dry  Lab Results:  Results for orders placed or performed during the hospital encounter of 06/30/17 (from the past 48 hour(s))  MRSA PCR Screening     Status: None   Collection Time: 06/30/17 11:30 AM  Result Value Ref Range   MRSA by PCR NEGATIVE NEGATIVE    Comment:        The GeneXpert MRSA Assay (FDA approved for NASAL specimens only), is one component of a comprehensive MRSA colonization surveillance program. It is not intended to diagnose MRSA infection nor to guide or monitor treatment for MRSA infections.   CBC with Differential/Platelet     Status: Abnormal   Collection Time: 06/30/17  5:00 PM  Result Value Ref Range   WBC 16.1 (H) 4.0 - 10.5 K/uL   RBC 4.58 4.22 - 5.81 MIL/uL   Hemoglobin 13.9 13.0 - 17.0 g/dL   HCT 42.6 39.0 - 52.0 %    MCV 93.0 78.0 - 100.0 fL   MCH 30.3 26.0 - 34.0 pg   MCHC 32.6 30.0 - 36.0 g/dL   RDW 13.5 11.5 - 15.5 %   Platelets 233 150 - 400 K/uL   Neutrophils Relative % 78 %   Neutro Abs 12.5 (H) 1.7 - 7.7 K/uL   Lymphocytes Relative 13 %   Lymphs Abs 2.2 0.7 - 4.0 K/uL   Monocytes Relative 7 %   Monocytes Absolute 1.1 (H) 0.1 - 1.0 K/uL   Eosinophils Relative 2 %   Eosinophils Absolute 0.3 0.0 - 0.7 K/uL   Basophils Relative 0 %   Basophils Absolute 0.1 0.0 - 0.1 K/uL  Comprehensive metabolic panel     Status: Abnormal   Collection Time: 06/30/17  5:00 PM  Result Value Ref Range   Sodium 146 (H) 135 - 145 mmol/L   Potassium 4.3 3.5 - 5.1 mmol/L   Chloride 107 101 - 111 mmol/L   CO2 29 22 - 32 mmol/L   Glucose, Bld 125 (H) 65 - 99 mg/dL   BUN 17 6 - 20 mg/dL   Creatinine, Ser 1.74 (H) 0.61 - 1.24 mg/dL   Calcium 9.9 8.9 - 10.3 mg/dL   Total Protein 7.9 6.5 - 8.1 g/dL   Albumin 4.3 3.5 - 5.0 g/dL   AST 24 15 - 41 U/L   ALT 20 17 -  63 U/L   Alkaline Phosphatase 86 38 - 126 U/L   Total Bilirubin 0.8 0.3 - 1.2 mg/dL   GFR calc non Af Amer 40 (L) >60 mL/min   GFR calc Af Amer 46 (L) >60 mL/min    Comment: (NOTE) The eGFR has been calculated using the CKD EPI equation. This calculation has not been validated in all clinical situations. eGFR's persistently <60 mL/min signify possible Chronic Kidney Disease.    Anion gap 10 5 - 15  Ethanol     Status: None   Collection Time: 06/30/17  5:00 PM  Result Value Ref Range   Alcohol, Ethyl (B) <5 <5 mg/dL    Comment:        LOWEST DETECTABLE LIMIT FOR SERUM ALCOHOL IS 5 mg/dL FOR MEDICAL PURPOSES ONLY   CK     Status: None   Collection Time: 06/30/17  5:00 PM  Result Value Ref Range   Total CK 105 49 - 397 U/L  CBG monitoring, ED     Status: Abnormal   Collection Time: 06/30/17  5:00 PM  Result Value Ref Range   Glucose-Capillary 113 (H) 65 - 99 mg/dL  I-stat troponin, ED     Status: None   Collection Time: 06/30/17  5:01 PM   Result Value Ref Range   Troponin i, poc 0.00 0.00 - 0.08 ng/mL   Comment 3            Comment: Due to the release kinetics of cTnI, a negative result within the first hours of the onset of symptoms does not rule out myocardial infarction with certainty. If myocardial infarction is still suspected, repeat the test at appropriate intervals.   Blood gas, arterial ([facility] & [facility] ONLY)     Status: Abnormal   Collection Time: 06/30/17  5:01 PM  Result Value Ref Range   O2 Content 2.5 L/min   Delivery systems NASAL CANNULA    pH, Arterial 7.301 (L) 7.350 - 7.450   pCO2 arterial 61.4 (H) 32.0 - 48.0 mmHg   pO2, Arterial 65.1 (L) 83.0 - 108.0 mmHg   Bicarbonate 25.9 20.0 - 28.0 mmol/L   Acid-Base Excess 3.5 (H) 0.0 - 2.0 mmol/L   O2 Saturation 89.5 %   Collection site RIGHT RADIAL    Drawn by [phone]    Sample type ARTERIAL    Allens test (pass/fail) PASS PASS  I-stat Chem 8, ED     Status: Abnormal   Collection Time: 06/30/17  5:05 PM  Result Value Ref Range   Sodium 145 135 - 145 mmol/L   Potassium 4.5 3.5 - 5.1 mmol/L   Chloride 108 101 - 111 mmol/L   BUN 18 6 - 20 mg/dL   Creatinine, Ser 1.80 (H) 0.61 - 1.24 mg/dL   Glucose, Bld 121 (H) 65 - 99 mg/dL   Calcium, Ion 1.17 1.15 - 1.40 mmol/L   TCO2 29 0 - 100 mmol/L   Hemoglobin 14.3 13.0 - 17.0 g/dL   HCT 42.0 39.0 - 52.0 %  I-Stat CG4 Lactic Acid, ED     Status: Abnormal   Collection Time: 06/30/17  5:10 PM  Result Value Ref Range   Lactic Acid, Venous 2.19 (HH) 0.5 - 1.9 mmol/L   Comment NOTIFIED PHYSICIAN   Rapid urine drug screen (hospital performed)     Status: Abnormal   Collection Time: 06/30/17  5:15 PM  Result Value Ref Range   Opiates POSITIVE (A) NONE DETECTED   Cocaine NONE DETECTED NONE DETECTED  Benzodiazepines NONE DETECTED NONE DETECTED   Amphetamines NONE DETECTED NONE DETECTED   Tetrahydrocannabinol NONE DETECTED NONE DETECTED   Barbiturates NONE DETECTED NONE DETECTED    Comment:        DRUG SCREEN FOR  MEDICAL PURPOSES ONLY.  IF CONFIRMATION IS NEEDED FOR ANY PURPOSE, NOTIFY LAB WITHIN 5 DAYS.        LOWEST DETECTABLE LIMITS FOR URINE DRUG SCREEN Drug Class       Cutoff (ng/mL) Amphetamine      1000 Barbiturate      200 Benzodiazepine   161 Tricyclics       096 Opiates          300 Cocaine          300 THC              50   Urinalysis, Routine w reflex microscopic     Status: Abnormal   Collection Time: 06/30/17  5:15 PM  Result Value Ref Range   Color, Urine YELLOW YELLOW   APPearance HAZY (A) CLEAR   Specific Gravity, Urine 1.018 1.005 - 1.030   pH 5.0 5.0 - 8.0   Glucose, UA NEGATIVE NEGATIVE mg/dL   Hgb urine dipstick NEGATIVE NEGATIVE   Bilirubin Urine NEGATIVE NEGATIVE   Ketones, ur NEGATIVE NEGATIVE mg/dL   Protein, ur 30 (A) NEGATIVE mg/dL   Nitrite NEGATIVE NEGATIVE   Leukocytes, UA NEGATIVE NEGATIVE   RBC / HPF 0-5 0 - 5 RBC/hpf   WBC, UA 0-5 0 - 5 WBC/hpf   Bacteria, UA NONE SEEN NONE SEEN   Squamous Epithelial / LPF 0-5 (A) NONE SEEN   Mucus PRESENT    Hyaline Casts, UA PRESENT    Granular Casts, UA PRESENT   Troponin I (q 6hr x 3)     Status: Abnormal   Collection Time: 07/01/17 12:15 AM  Result Value Ref Range   Troponin I 0.03 (HH) <0.03 ng/mL    Comment: CRITICAL RESULT CALLED TO, READ BACK BY AND VERIFIED WITH:  Son,J @ 0139 ON 07/01/17 BY JUW   HIV antibody (Routine Testing)     Status: None   Collection Time: 07/01/17 12:15 AM  Result Value Ref Range   HIV Screen 4th Generation wRfx Non Reactive Non Reactive    Comment: (NOTE) Performed At: [facility] [place], [place] [id number] Lindon Romp MD BJ:[id number]   TSH     Status: None   Collection Time: 07/01/17 12:15 AM  Result Value Ref Range   TSH 0.868 0.350 - 4.500 uIU/mL    Comment: Performed by a 3rd Generation assay with a functional sensitivity of <=0.01 uIU/mL.  Troponin I (q 6hr x 3)     Status: Abnormal   Collection Time: 07/01/17  5:06 AM  Result  Value Ref Range   Troponin I 0.03 (HH) <0.03 ng/mL    Comment: CRITICAL VALUE NOTED.  VALUE IS CONSISTENT WITH PREVIOUSLY REPORTED AND CALLED VALUE.  Comprehensive metabolic panel     Status: Abnormal   Collection Time: 07/01/17  5:06 AM  Result Value Ref Range   Sodium 144 135 - 145 mmol/L   Potassium 4.6 3.5 - 5.1 mmol/L   Chloride 108 101 - 111 mmol/L   CO2 32 22 - 32 mmol/L   Glucose, Bld 115 (H) 65 - 99 mg/dL   BUN 13 6 - 20 mg/dL   Creatinine, Ser 0.92 0.61 - 1.24 mg/dL   Calcium 8.6 (L) 8.9 - 10.3 mg/dL  Total Protein 6.7 6.5 - 8.1 g/dL   Albumin 3.5 3.5 - 5.0 g/dL   AST 19 15 - 41 U/L   ALT 19 17 - 63 U/L   Alkaline Phosphatase 74 38 - 126 U/L   Total Bilirubin 0.4 0.3 - 1.2 mg/dL   GFR calc non Af Amer >60 >60 mL/min   GFR calc Af Amer >60 >60 mL/min    Comment: (NOTE) The eGFR has been calculated using the CKD EPI equation. This calculation has not been validated in all clinical situations. eGFR's persistently <60 mL/min signify possible Chronic Kidney Disease.    Anion gap 4 (L) 5 - 15  CBC     Status: Abnormal   Collection Time: 07/01/17  5:06 AM  Result Value Ref Range   WBC 9.5 4.0 - 10.5 K/uL   RBC 4.16 (L) 4.22 - 5.81 MIL/uL   Hemoglobin 12.5 (L) 13.0 - 17.0 g/dL   HCT 39.7 39.0 - 52.0 %   MCV 95.4 78.0 - 100.0 fL   MCH 30.0 26.0 - 34.0 pg   MCHC 31.5 30.0 - 36.0 g/dL   RDW 13.8 11.5 - 15.5 %   Platelets 243 150 - 400 K/uL  Troponin I (q 6hr x 3)     Status: Abnormal   Collection Time: 07/01/17 11:32 AM  Result Value Ref Range   Troponin I 0.03 (HH) <0.03 ng/mL    Comment: CRITICAL VALUE NOTED.  VALUE IS CONSISTENT WITH PREVIOUSLY REPORTED AND CALLED VALUE.    ABGS  Recent Labs  06/30/17 1701 06/30/17 1705  PHART 7.301*  --   PO2ART 65.1*  --   TCO2  --  29  HCO3 25.9  --    CULTURES Recent Results (from the past 240 hour(s))  MRSA PCR Screening     Status: None   Collection Time: 06/30/17 11:30 AM  Result Value Ref Range Status   MRSA  by PCR NEGATIVE NEGATIVE Final    Comment:        The GeneXpert MRSA Assay (FDA approved for NASAL specimens only), is one component of a comprehensive MRSA colonization surveillance program. It is not intended to diagnose MRSA infection nor to guide or monitor treatment for MRSA infections.    Studies/Results: Ct Head Wo Contrast  Result Date: 06/30/2017 CLINICAL DATA:  Altered level of consciousness EXAM: CT HEAD WITHOUT CONTRAST TECHNIQUE: Contiguous axial images were obtained from the base of the skull through the vertex without intravenous contrast. COMPARISON:  Mar 19, 2002 FINDINGS: Brain: The ventricles are normal in size and contour for age. There is no appreciable intracranial mass, hemorrhage, extra-axial fluid collection, or midline shift. There is mild patchy small vessel disease in the centra semiovale bilaterally. Elsewhere gray-white compartments appear normal. No acute infarct is demonstrated. Vascular: There is no hyperdense vessel. There are foci of calcification in each carotid siphon region. Skull: The bony calvarium appears intact. Sinuses/Orbits: There is opacification in multiple ethmoid air cells bilaterally. There is mild mucosal thickening in each anterior sphenoid sinus. Frontal sinuses are aplastic. Visualized orbits appear symmetric bilaterally. Other: Mastoid air cells are clear. IMPRESSION: Mild patchy periventricular small vessel disease. No intracranial mass hemorrhage, or extra-axial fluid collection. No evident acute infarct. Foci of arterial vascular calcification noted. Opacification throughout multiple ethmoid air cells as well as mild mucosal thickening in each anterior sphenoid sinus. Electronically Signed   By: Lowella Grip III M.D.   On: 06/30/2017 18:04   Dg Chest [facility] 1 View  Result  Date: 06/30/2017 CLINICAL DATA:  Syncope EXAM: PORTABLE CHEST 1 VIEW COMPARISON:  April 13, 2008 FINDINGS: There is no edema or consolidation. Heart is borderline  enlarged with pulmonary vascularity within normal limits. No adenopathy. No bone lesions. There is aortic atherosclerosis. IMPRESSION: Cardiac enlargement. No edema or consolidation. There is aortic atherosclerosis. Aortic Atherosclerosis (ICD10-I70.0). Electronically Signed   By: Lowella Grip III M.D.   On: 06/30/2017 17:49    Medications:  Prior to Admission:  Prescriptions Prior to Admission  Medication Sig Dispense Refill Last Dose  . amLODipine (NORVASC) 10 MG tablet Take 10 mg by mouth daily.   06/29/2017 at Unknown time  . baclofen (LIORESAL) 20 MG tablet Take 20 mg by mouth 3 (three) times daily.   06/30/2017 at Unknown time  . Cyanocobalamin (VITAMIN B 12 PO) Take 1 tablet by mouth daily.   06/30/2017 at Unknown time  . furosemide (LASIX) 20 MG tablet Take 20 mg by mouth daily.   06/30/2017 at Unknown time  . gabapentin (NEURONTIN) 300 MG capsule Take 300 mg by mouth 4 (four) times daily as needed.   06/30/2017 at Unknown time  . losartan (COZAAR) 100 MG tablet Take 100 mg by mouth daily.   06/29/2017 at Unknown time  . meloxicam (MOBIC) 15 MG tablet Take 15 mg by mouth daily.   06/30/2017 at Unknown time  . oxycodone (ROXICODONE) 30 MG immediate release tablet Take 30-60 mg by mouth every 8 (eight) hours as needed for pain.   06/30/2017 at Unknown time  . potassium chloride SA (K-DUR,KLOR-CON) 20 MEQ tablet Take 1 tablet by mouth daily.   06/30/2017 at Unknown time   Scheduled: . gabapentin  300 mg Oral TID  . heparin  5,000 Units Subcutaneous Q8H  . sodium chloride flush  3 mL Intravenous Q12H   Continuous: . dextrose 5 % and 0.9% NaCl 100 mL/hr at 07/02/17 0715  . naLOXone [facility]) adult infusion for OVERDOSE Stopped (06/30/17 2205)   YOM:AYOKHTXHF, ondansetron **OR** ondansetron (ZOFRAN) IV  Assesment:He was admitted after syncopal episode. He was diagnosed with narcotic overdose but I'm not sure that's actually what happened. I think he was somewhat sleep deprived and with his  sleep apnea he basically fell asleep. He is very hard to arouse at baseline. We discussed his sleep apnea and the fact that he needs to be on CPAP because this is potentially fatal particularly since she's had this episode. He's been on a stable dose of narcotics for many years. Active Problems:   Narcotic overdose    Plan: Discharge home today    LOS: 2 days   Nilza Eaker L 07/02/2017, 8:56 AM

## 2017-07-02 NOTE — Discharge Summary (Signed)
Physician Discharge Summary  Patient ID: Alexander Hatfield MRN: [id number] DOB/AGE: [birth year] 61 y.o. Primary Care Physician:Fagan, Carloyn Manner, MD Admit date: 06/30/2017 Discharge date: 07/02/2017    Discharge Diagnoses:   Principal Problem:   Syncope Active Problems:   Obstructive sleep apnea   Morbid obesity (HCC)   Chronic back pain   Essential hypertension   Allergies as of 07/02/2017      Reactions   Iohexol     Desc: PT STATES THAT WHEN GIVEN THE CONTRAST HIS LEGS BURN AND HE PASSES OUT, COMA FOR 7DAY. DR Thornton Papas WOULD NOT PRE MEDICATE DUE TO SEVERITY OF REACTION, STUDY WAS CX      Medication List    TAKE these medications   amLODipine 10 MG tablet Commonly known as:  NORVASC Take 10 mg by mouth daily.   baclofen 20 MG tablet Commonly known as:  LIORESAL Take 20 mg by mouth 3 (three) times daily.   furosemide 20 MG tablet Commonly known as:  LASIX Take 20 mg by mouth daily.   gabapentin 300 MG capsule Commonly known as:  NEURONTIN Take 300 mg by mouth 4 (four) times daily as needed.   losartan 100 MG tablet Commonly known as:  COZAAR Take 100 mg by mouth daily.   meloxicam 15 MG tablet Commonly known as:  MOBIC Take 15 mg by mouth daily.   oxycodone 30 MG immediate release tablet Commonly known as:  ROXICODONE Take 30-60 mg by mouth every 8 (eight) hours as needed for pain.   potassium chloride SA 20 MEQ tablet Commonly known as:  K-DUR,KLOR-CON Take 1 tablet by mouth daily.   VITAMIN B 12 PO Take 1 tablet by mouth daily.       Discharged Condition:Improved    Consults: None  Significant Diagnostic Studies: Ct Head Wo Contrast  Result Date: 06/30/2017 CLINICAL DATA:  Altered level of consciousness EXAM: CT HEAD WITHOUT CONTRAST TECHNIQUE: Contiguous axial images were obtained from the base of the skull through the vertex without intravenous contrast. COMPARISON:  Mar 19, 2002 FINDINGS: Brain: The ventricles are normal in size and contour for age.  There is no appreciable intracranial mass, hemorrhage, extra-axial fluid collection, or midline shift. There is mild patchy small vessel disease in the centra semiovale bilaterally. Elsewhere gray-white compartments appear normal. No acute infarct is demonstrated. Vascular: There is no hyperdense vessel. There are foci of calcification in each carotid siphon region. Skull: The bony calvarium appears intact. Sinuses/Orbits: There is opacification in multiple ethmoid air cells bilaterally. There is mild mucosal thickening in each anterior sphenoid sinus. Frontal sinuses are aplastic. Visualized orbits appear symmetric bilaterally. Other: Mastoid air cells are clear. IMPRESSION: Mild patchy periventricular small vessel disease. No intracranial mass hemorrhage, or extra-axial fluid collection. No evident acute infarct. Foci of arterial vascular calcification noted. Opacification throughout multiple ethmoid air cells as well as mild mucosal thickening in each anterior sphenoid sinus. Electronically Signed   By: Lowella Grip III M.D.   On: 06/30/2017 18:04   Dg Chest Port 1 View  Result Date: 06/30/2017 CLINICAL DATA:  Syncope EXAM: PORTABLE CHEST 1 VIEW COMPARISON:  April 13, 2008 FINDINGS: There is no edema or consolidation. Heart is borderline enlarged with pulmonary vascularity within normal limits. No adenopathy. No bone lesions. There is aortic atherosclerosis. IMPRESSION: Cardiac enlargement. No edema or consolidation. There is aortic atherosclerosis. Aortic Atherosclerosis (ICD10-I70.0). Electronically Signed   By: Lowella Grip III M.D.   On: 06/30/2017 17:49    Lab Results: Basic Metabolic Panel:  Recent Labs  06/30/17 1700 06/30/17 1705 07/01/17 0506  NA 146* 145 144  K 4.3 4.5 4.6  CL 107 108 108  CO2 29  --  32  GLUCOSE 125* 121* 115*  BUN 17 18 13   CREATININE 1.74* 1.80* 0.92  CALCIUM 9.9  --  8.6*   Liver Function Tests:  Recent Labs  06/30/17 1700 07/01/17 0506  AST 24  19  ALT 20 19  ALKPHOS 86 74  BILITOT 0.8 0.4  PROT 7.9 6.7  ALBUMIN 4.3 3.5     CBC:  Recent Labs  06/30/17 1700 06/30/17 1705 07/01/17 0506  WBC 16.1*  --  9.5  NEUTROABS 12.5*  --   --   HGB 13.9 14.3 12.5*  HCT 42.6 42.0 39.7  MCV 93.0  --  95.4  PLT 233  --  243    Recent Results (from the past 240 hour(s))  MRSA PCR Screening     Status: None   Collection Time: 06/30/17 11:30 AM  Result Value Ref Range Status   MRSA by PCR NEGATIVE NEGATIVE Final    Comment:        The GeneXpert MRSA Assay (FDA approved for NASAL specimens only), is one component of a comprehensive MRSA colonization surveillance program. It is not intended to diagnose MRSA infection nor to guide or monitor treatment for MRSA infections.      Hospital Course: This is a 61 year old with a long known history of severe chronic back pain. He was in his usual state of poor health at home says that his wife has been keeping him up at night and he's not been sleeping well. He has sleep apnea at baseline but does not use CPAP because he said he didn't tolerate it. He went to play golf and passed out. There was concern that he might of had a narcotic overdose because he is on chronic narcotics but he did not have much response to Narcan. He later was arousable. I think what happened is that he had sleep deprivation plus sleep apnea plus his chronic narcotics and I think that caused him to have syncope. I do not believe this was a drug overdose. He was much better the next day back at his baseline and ready for discharge. I'm going to set him up for another sleep study because I'm very concerned about his sleep apnea. He says he would try CPAP again  Discharge Exam: Blood pressure (!) 160/81, pulse (!) 50, temperature 98.1 F (36.7 C), temperature source Oral, resp. rate 17, height 6\' 1"  (1.854 m), weight (!) 141.1 kg (311 lb 1.1 oz), SpO2 (!) 63 %. He's awake and alert. Chest is clear. Heart is regular. He  is morbidly obese.  Disposition: Home      Signed: Kendon Sedeno L   07/02/2017, 9:00 AM

## 2017-07-03 LAB — TESTOSTERONE: Testosterone: 207 ng/dL — ABNORMAL LOW (ref 264–916)

## 2017-07-03 LAB — SEX HORMONE BINDING GLOBULIN: Sex Hormone Binding: 69.6 nmol/L (ref 19.3–76.4)

## 2017-07-05 ENCOUNTER — Other Ambulatory Visit (HOSPITAL_BASED_OUTPATIENT_CLINIC_OR_DEPARTMENT_OTHER): Payer: Self-pay

## 2017-07-05 DIAGNOSIS — R0683 Snoring: Secondary | ICD-10-CM

## 2017-07-05 DIAGNOSIS — G4730 Sleep apnea, unspecified: Secondary | ICD-10-CM

## 2017-07-06 LAB — TESTOSTERONE, % FREE: Testosterone-% Free: 1.2 % — ABNORMAL LOW (ref 0.2–0.7)

## 2017-07-25 ENCOUNTER — Ambulatory Visit: Payer: Medicare Other | Attending: Pulmonary Disease | Admitting: Neurology

## 2017-07-25 DIAGNOSIS — R0683 Snoring: Secondary | ICD-10-CM | POA: Diagnosis present

## 2017-07-25 DIAGNOSIS — G4733 Obstructive sleep apnea (adult) (pediatric): Secondary | ICD-10-CM | POA: Diagnosis not present

## 2017-07-25 DIAGNOSIS — G4730 Sleep apnea, unspecified: Secondary | ICD-10-CM

## 2017-07-25 DIAGNOSIS — Z79899 Other long term (current) drug therapy: Secondary | ICD-10-CM | POA: Insufficient documentation

## 2017-07-25 DIAGNOSIS — G4736 Sleep related hypoventilation in conditions classified elsewhere: Secondary | ICD-10-CM | POA: Insufficient documentation

## 2017-07-27 NOTE — Procedures (Signed)
[place] A. Merlene Laughter, MD     [email or web address]             NOCTURNAL POLYSOMNOGRAPHY   LOCATION: [facility]  Patient Name: Alexander Hatfield, Alexander Hatfield Date: 07/25/2017 Gender: Male D.O.B: August 02, 1956 Age (years): 61 Referring Provider: [name] Height (inches): 74 Interpreting Physician: Phillips Odor MD, ABSM Weight (lbs): 309 RPSGT: Peak, Robert BMI: 40 MRN: [id number] Neck Size: 19.00 CLINICAL INFORMATION Sleep Study Type: Split Night CPAP  Indication for sleep study: Snoring  Epworth Sleepiness Score: 3  SLEEP STUDY TECHNIQUE As per the AASM Manual for the Scoring of Sleep and Associated Events v2.3 (April 2016) with a hypopnea requiring 4% desaturations.  The channels recorded and monitored were frontal, central and occipital EEG, electrooculogram (EOG), submentalis EMG (chin), nasal and oral airflow, thoracic and abdominal wall motion, anterior tibialis EMG, snore microphone, electrocardiogram, and pulse oximetry. Continuous positive airway pressure (CPAP) was initiated when the patient met split night criteria and was titrated according to treat sleep-disordered breathing.  MEDICATIONS Medications self-administered by patient taken the night of the study : N/A  Current Outpatient Prescriptions:  .  amLODipine (NORVASC) 10 MG tablet, Take 10 mg by mouth daily., Disp: , Rfl:  .  baclofen (LIORESAL) 20 MG tablet, Take 20 mg by mouth 3 (three) times daily., Disp: , Rfl:  .  Cyanocobalamin (VITAMIN B 12 PO), Take 1 tablet by mouth daily., Disp: , Rfl:  .  furosemide (LASIX) 20 MG tablet, Take 20 mg by mouth daily., Disp: , Rfl:  .  gabapentin (NEURONTIN) 300 MG capsule, Take 300 mg by mouth 4 (four) times daily as needed., Disp: , Rfl:  .  losartan (COZAAR) 100 MG tablet, Take 100 mg by mouth daily., Disp: , Rfl:  .  meloxicam (MOBIC) 15 MG tablet, Take 15 mg by mouth daily., Disp: , Rfl:  .  oxycodone (ROXICODONE) 30 MG immediate release  tablet, Take 30-60 mg by mouth every 8 (eight) hours as needed for pain., Disp: , Rfl:  .  potassium chloride SA (K-DUR,KLOR-CON) 20 MEQ tablet, Take 1 tablet by mouth daily., Disp: , Rfl:    RESPIRATORY PARAMETERS Diagnostic  Total AHI (/hr): 19.8 RDI (/hr): 20.1 OA Index (/hr): 0.3 CA Index (/hr): 0.0 REM AHI (/hr): 24.0 NREM AHI (/hr): 19.8 Supine AHI (/hr): N/A Non-supine AHI (/hr): 19.82 Min O2 Sat (%): 75.00 Mean O2 (%): 89.26 Time below 88% (min): 94.8   Titration:  The patient was titrated between pressures of 5-9.  The optimal pressure is 9.  There was persistent desaturations into the 80s  that were not associated with apneic events.  Optimal Pressure (cm): 9 AHI at Optimal Pressure (/hr): 0.0 Min O2 at Optimal Pressure (%): 88.0 Supine % at Optimal (%): 0 Sleep % at Optimal (%): 91   SLEEP ARCHITECTURE The recording time for the entire night was 401.6 minutes.  During a baseline period of 254.6 minutes, the patient slept for 221.0 minutes in REM and nonREM, yielding a sleep efficiency of 86.8%. Sleep onset after lights out was 2.1 minutes with a REM latency of 197.0 minutes. The patient spent 7.01% of the night in stage N1 sleep, 89.59% in stage N2 sleep, 2.26% in stage N3 and 1.13% in REM.  During the titration period of 144.9 minutes, the patient slept for 130.5 minutes in REM and nonREM, yielding a sleep efficiency of 90.1%. Sleep onset after CPAP initiation was 2.9 minutes with a REM latency of 60.0 minutes. The patient spent 9.96%  of the night in stage N1 sleep, 72.79% in stage N2 sleep, 0.00% in stage N3 and 17.25% in REM.  CARDIAC DATA The 2 lead EKG demonstrated sinus rhythm. The mean heart rate was 57.31 beats per minute. Other EKG findings include: None. LEG MOVEMENT DATA The total Periodic Limb Movements of Sleep (PLMS) were 0. The PLMS index was 0.00.  IMPRESSIONS - Moderate obstructive sleep apnea occurred during the diagnostic portion of the study(AHI = 19.8/hour).  An optimal PAP pressure was selected for this patient ( 9 cm of water), - Hypoventilation syndrome is also observed.  I recommended nocturnal oximetry on CPAP to determine the need of supplemental oxygen once the patient has been treated with positive pressure for over 1 month.   Delano Metz, MD Diplomate, American Board of Sleep Medicine.  ELECTRONICALLY SIGNED ON:  07/27/2017, 6:36 PM [place] PH: [phone]   FX: [phone] [place]

## 2017-07-31 DIAGNOSIS — G4733 Obstructive sleep apnea (adult) (pediatric): Secondary | ICD-10-CM | POA: Diagnosis not present

## 2017-08-01 DIAGNOSIS — Z23 Encounter for immunization: Secondary | ICD-10-CM | POA: Diagnosis not present

## 2017-08-13 DIAGNOSIS — G4733 Obstructive sleep apnea (adult) (pediatric): Secondary | ICD-10-CM | POA: Diagnosis not present

## 2017-08-30 DIAGNOSIS — G4733 Obstructive sleep apnea (adult) (pediatric): Secondary | ICD-10-CM | POA: Diagnosis not present

## 2017-09-19 DIAGNOSIS — G8929 Other chronic pain: Secondary | ICD-10-CM | POA: Diagnosis not present

## 2017-09-19 DIAGNOSIS — M47816 Spondylosis without myelopathy or radiculopathy, lumbar region: Secondary | ICD-10-CM | POA: Diagnosis not present

## 2017-09-30 DIAGNOSIS — G4733 Obstructive sleep apnea (adult) (pediatric): Secondary | ICD-10-CM | POA: Diagnosis not present

## 2017-10-30 DIAGNOSIS — G4733 Obstructive sleep apnea (adult) (pediatric): Secondary | ICD-10-CM | POA: Diagnosis not present

## 2017-11-04 DIAGNOSIS — Z0000 Encounter for general adult medical examination without abnormal findings: Secondary | ICD-10-CM | POA: Diagnosis not present

## 2017-11-04 DIAGNOSIS — I10 Essential (primary) hypertension: Secondary | ICD-10-CM | POA: Diagnosis not present

## 2017-11-04 DIAGNOSIS — Z125 Encounter for screening for malignant neoplasm of prostate: Secondary | ICD-10-CM | POA: Diagnosis not present

## 2017-11-04 DIAGNOSIS — R609 Edema, unspecified: Secondary | ICD-10-CM | POA: Diagnosis not present

## 2017-11-04 DIAGNOSIS — R739 Hyperglycemia, unspecified: Secondary | ICD-10-CM | POA: Diagnosis not present

## 2017-11-30 DIAGNOSIS — G4733 Obstructive sleep apnea (adult) (pediatric): Secondary | ICD-10-CM | POA: Diagnosis not present

## 2017-12-19 DIAGNOSIS — M47816 Spondylosis without myelopathy or radiculopathy, lumbar region: Secondary | ICD-10-CM | POA: Diagnosis not present

## 2017-12-31 DIAGNOSIS — G4733 Obstructive sleep apnea (adult) (pediatric): Secondary | ICD-10-CM | POA: Diagnosis not present

## 2018-01-28 DIAGNOSIS — G4733 Obstructive sleep apnea (adult) (pediatric): Secondary | ICD-10-CM | POA: Diagnosis not present

## 2018-02-28 DIAGNOSIS — G4733 Obstructive sleep apnea (adult) (pediatric): Secondary | ICD-10-CM | POA: Diagnosis not present

## 2018-03-20 DIAGNOSIS — M47816 Spondylosis without myelopathy or radiculopathy, lumbar region: Secondary | ICD-10-CM | POA: Diagnosis not present

## 2018-03-20 DIAGNOSIS — G894 Chronic pain syndrome: Secondary | ICD-10-CM | POA: Diagnosis not present

## 2018-03-30 DIAGNOSIS — G4733 Obstructive sleep apnea (adult) (pediatric): Secondary | ICD-10-CM | POA: Diagnosis not present

## 2018-04-28 DIAGNOSIS — G4733 Obstructive sleep apnea (adult) (pediatric): Secondary | ICD-10-CM | POA: Diagnosis not present

## 2018-04-28 DIAGNOSIS — M545 Low back pain: Secondary | ICD-10-CM | POA: Diagnosis not present

## 2018-04-28 DIAGNOSIS — I10 Essential (primary) hypertension: Secondary | ICD-10-CM | POA: Diagnosis not present

## 2018-04-30 DIAGNOSIS — G4733 Obstructive sleep apnea (adult) (pediatric): Secondary | ICD-10-CM | POA: Diagnosis not present

## 2018-05-30 DIAGNOSIS — G4733 Obstructive sleep apnea (adult) (pediatric): Secondary | ICD-10-CM | POA: Diagnosis not present

## 2018-06-23 DIAGNOSIS — M47816 Spondylosis without myelopathy or radiculopathy, lumbar region: Secondary | ICD-10-CM | POA: Diagnosis not present

## 2018-06-23 DIAGNOSIS — G894 Chronic pain syndrome: Secondary | ICD-10-CM | POA: Diagnosis not present

## 2018-06-30 DIAGNOSIS — G4733 Obstructive sleep apnea (adult) (pediatric): Secondary | ICD-10-CM | POA: Diagnosis not present

## 2018-09-16 DIAGNOSIS — G894 Chronic pain syndrome: Secondary | ICD-10-CM | POA: Diagnosis not present

## 2018-09-16 DIAGNOSIS — M2550 Pain in unspecified joint: Secondary | ICD-10-CM | POA: Diagnosis not present

## 2018-09-16 DIAGNOSIS — I110 Hypertensive heart disease with heart failure: Secondary | ICD-10-CM | POA: Diagnosis not present

## 2018-09-22 DIAGNOSIS — G894 Chronic pain syndrome: Secondary | ICD-10-CM | POA: Diagnosis not present

## 2018-09-22 DIAGNOSIS — M47816 Spondylosis without myelopathy or radiculopathy, lumbar region: Secondary | ICD-10-CM | POA: Diagnosis not present

## 2018-09-25 DIAGNOSIS — M47812 Spondylosis without myelopathy or radiculopathy, cervical region: Secondary | ICD-10-CM | POA: Diagnosis not present

## 2018-09-25 DIAGNOSIS — M542 Cervicalgia: Secondary | ICD-10-CM | POA: Diagnosis not present

## 2018-09-30 ENCOUNTER — Other Ambulatory Visit (HOSPITAL_COMMUNITY): Payer: Self-pay | Admitting: Nurse Practitioner

## 2018-09-30 DIAGNOSIS — M4722 Other spondylosis with radiculopathy, cervical region: Secondary | ICD-10-CM

## 2018-10-06 ENCOUNTER — Ambulatory Visit (HOSPITAL_COMMUNITY)
Admission: RE | Admit: 2018-10-06 | Discharge: 2018-10-06 | Disposition: A | Discharge status: Home / Self Care | Payer: Medicare Other | Source: Ambulatory Visit | Attending: Nurse Practitioner | Admitting: Nurse Practitioner

## 2018-10-06 DIAGNOSIS — M4722 Other spondylosis with radiculopathy, cervical region: Secondary | ICD-10-CM | POA: Insufficient documentation

## 2018-10-06 DIAGNOSIS — M50223 Other cervical disc displacement at C6-C7 level: Secondary | ICD-10-CM | POA: Diagnosis not present

## 2018-10-06 DIAGNOSIS — M4802 Spinal stenosis, cervical region: Secondary | ICD-10-CM | POA: Diagnosis not present

## 2018-10-13 DIAGNOSIS — M4722 Other spondylosis with radiculopathy, cervical region: Secondary | ICD-10-CM | POA: Diagnosis not present

## 2018-10-16 DIAGNOSIS — Z01812 Encounter for preprocedural laboratory examination: Secondary | ICD-10-CM | POA: Diagnosis not present

## 2018-10-16 DIAGNOSIS — R911 Solitary pulmonary nodule: Secondary | ICD-10-CM | POA: Diagnosis not present

## 2018-10-16 DIAGNOSIS — M4722 Other spondylosis with radiculopathy, cervical region: Secondary | ICD-10-CM | POA: Diagnosis not present

## 2018-10-16 DIAGNOSIS — Z01810 Encounter for preprocedural cardiovascular examination: Secondary | ICD-10-CM | POA: Diagnosis not present

## 2018-10-16 DIAGNOSIS — Z01811 Encounter for preprocedural respiratory examination: Secondary | ICD-10-CM | POA: Diagnosis not present

## 2018-10-17 DIAGNOSIS — R918 Other nonspecific abnormal finding of lung field: Secondary | ICD-10-CM | POA: Diagnosis not present

## 2018-10-21 ENCOUNTER — Telehealth: Payer: Self-pay | Admitting: Pulmonary Disease

## 2018-10-21 ENCOUNTER — Encounter: Payer: Self-pay | Admitting: Pulmonary Disease

## 2018-10-21 ENCOUNTER — Ambulatory Visit: Payer: Medicare Other | Admitting: Pulmonary Disease

## 2018-10-21 ENCOUNTER — Other Ambulatory Visit: Payer: Self-pay | Admitting: Neurosurgery

## 2018-10-21 VITALS — BP 110/60 | HR 50

## 2018-10-21 DIAGNOSIS — R918 Other nonspecific abnormal finding of lung field: Secondary | ICD-10-CM

## 2018-10-21 DIAGNOSIS — G959 Disease of spinal cord, unspecified: Secondary | ICD-10-CM | POA: Diagnosis not present

## 2018-10-21 DIAGNOSIS — J449 Chronic obstructive pulmonary disease, unspecified: Secondary | ICD-10-CM | POA: Diagnosis not present

## 2018-10-21 DIAGNOSIS — G4733 Obstructive sleep apnea (adult) (pediatric): Secondary | ICD-10-CM

## 2018-10-21 NOTE — Telephone Encounter (Signed)
Would take first available with any provider if can see the patient sooner than 10/24/18.  Faxing over information now.

## 2018-10-21 NOTE — Progress Notes (Signed)
Subjective:    Patient ID: Alexander Hatfield., male    DOB: 1956-05-13, 62 y.o.   MRN: [id number]  HPI  Chief Complaint  Patient presents with  . Pulm Consult    Referred by Dr. Saintclair Halsted for lung mass. Mass discovered during recent OV. States he does have some SOB. Mass is possibly on the right side.      62 year old remote smoker presents for evaluation of lung mass. 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  He has had several back surgeries and required several spinal stimulators.  He was found to have cervical disc disease and plan was to have cervical fusion surgery.  Preop chest x-ray showed right upper lobe lung mass.  Surgery was originally scheduled at [facility] by Dr. Carloyn Manner  CT chest without contrast was performed on 12/6.  I have personally reviewed images with the patient.  This showed 40 x 30 mm angulated right upper lobe lung mass associated with 24 mm precarinal lymph node.  No pleural effusion was noted. His surgery has now been rescheduled at [facility] by Dr. Saintclair Halsted on 12/16  He presents in a wheelchair since he has poor balance.  He reports some numbness of both arms and feet and weakness.  He has OSA for which she is compliant with nasal pillows. He denies shortness of breath or wheezing or recurrent chest colds.  His only complaint is a hoarse voice which developed acutely and he has been given Levaquin for 10 days.  He denies preceding URI symptoms  There is no weight loss. He smoked for about 20 years starting at age 53 up to 53 years up to 2 packs/day.   family history of brain tumor in his mother and lupus     Past Medical History:  Diagnosis Date  . Chronic back pain    Past Surgical History:  Procedure Laterality Date  . BACK SURGERY      Allergies  Allergen Reactions  . Iohexol      Desc: PT STATES THAT WHEN GIVEN THE CONTRAST HIS LEGS BURN AND HE PASSES OUT, COMA FOR 7DAY. DR Thornton Papas WOULD NOT PRE MEDICATE DUE  TO SEVERITY OF REACTION, STUDY WAS CX       Social History   Socioeconomic History  . Marital status: Married    Spouse name: Not on file  . Number of children: Not on file  . Years of education: Not on file  . Highest education level: Not on file  Occupational History  . Not on file  Social Needs  . Financial resource strain: Not on file  . Food insecurity:    Worry: Not on file    Inability: Not on file  . Transportation needs:    Medical: Not on file    Non-medical: Not on file  Tobacco Use  . Smoking status: Never Smoker  . Smokeless tobacco: Never Used  Substance and Sexual Activity  . Alcohol use: No  . Drug use: No  . Sexual activity: Not on file  Lifestyle  . Physical activity:    Days per week: Not on file    Minutes per session: Not on file  . Stress: Not on file  Relationships  . Social connections:    Talks on phone: Not on file    Gets together: Not on file    Attends religious service: Not on file    Active member of club or organization:  Not on file    Attends meetings of clubs or organizations: Not on file    Relationship status: Not on file  . Intimate partner violence:    Fear of current or ex partner: Not on file    Emotionally abused: Not on file    Physically abused: Not on file    Forced sexual activity: Not on file  Other Topics Concern  . Not on file  Social History Narrative  . Not on file      No family history on file.    Review of Systems  Constitutional: Positive for appetite change. Negative for fever and unexpected weight change.  HENT: Positive for sore throat. Negative for congestion, dental problem, ear pain, nosebleeds, postnasal drip, rhinorrhea, sinus pressure, sneezing and trouble swallowing.   Eyes: Negative for redness and itching.  Respiratory: Positive for shortness of breath. Negative for cough, chest tightness and wheezing.   Cardiovascular: Positive for chest pain. Negative for palpitations and leg  swelling.  Gastrointestinal: Negative for nausea and vomiting.  Genitourinary: Negative for dysuria.  Musculoskeletal: Positive for joint swelling.  Skin: Negative for rash.  Allergic/Immunologic: Negative.  Negative for environmental allergies, food allergies and immunocompromised state.  Neurological: Positive for headaches.  Hematological: Does not bruise/bleed easily.  Psychiatric/Behavioral: Positive for dysphoric mood. The patient is nervous/anxious.        Objective:   Physical Exam  Gen. Pleasant, obese, in no distress, normal affect ENT - no pallor or icterus, no post nasal drip, class 2-3 airway Neck: No JVD, no thyromegaly, no carotid bruits Lungs: no use of accessory muscles, no dullness to percussion, decreased without rales or rhonchi  Cardiovascular: Rhythm regular, heart sounds  normal, no murmurs or gallops, no peripheral edema Abdomen: soft and non-tender, no hepatosplenomegaly, BS normal. Musculoskeletal: No deformities, no cyanosis or clubbing Neuro:  alert, non focal, no tremors       Assessment & Plan:

## 2018-10-21 NOTE — Assessment & Plan Note (Signed)
He should continue on CPAP perioperatively, anticipate difficult airway and place on CPAP postop

## 2018-10-21 NOTE — Assessment & Plan Note (Addendum)
New finding of right upper lobe lung mass with precarinal lymphadenopathy is very suspicious for lung cancer in this remote smoker. Unfortunately staging will involve a PET scan followed by bronchoscopy/endobronchial ultrasound and needle aspiration biopsy of precarinal lymph node-if this is positive it would mean stage IIIb lung cancer. It seems that he has already been scheduled for neck surgery on 12/16 it would be difficult for [place] to coordinate EBUS on the same day but we will see if we can try to obtain PET scan sooner.  The various options of biopsy including bronchoscopy, CT guided needle aspiration and surgical biopsy were discussed.The risks of each procedure including coughing, bleeding and the  chances of lung puncture requiring chest tube were discussed in great detail. The benefits & alternatives including serial follow up were also discussed.

## 2018-10-21 NOTE — Patient Instructions (Signed)
Schedule PET scan ASAP. Schedule PFTs  I will coordinate with Dr. Saintclair Halsted about your biopsy

## 2018-10-21 NOTE — Telephone Encounter (Signed)
Called and spoke with patient's wife, let her know this phone call was left prior to OV today.   Nothing further needed at this time.

## 2018-10-21 NOTE — Telephone Encounter (Signed)
Called and spoke with Lorriane Shire at Dr. Windy Carina office, the patient has a new lung mass found on his pre-op images. Patient is in office. Dr. Saintclair Halsted would like this patient to be see prior to visit on 12.13.2019. Patient scheduled with Dr. Elsworth Soho today at 1130. Explained to office they would need to send over as many records at possible and gave them our fax number.   Patient is leaving office now 1045 and headed this way for an 1130 appt.   Nothing further needed at this time.

## 2018-10-21 NOTE — H&P (View-Only) (Signed)
Subjective:    Patient ID: Alexander Costain., Alexander Hatfield    DOB: 08-07-1956, 62 y.o.   MRN: [id number]  HPI  Chief Complaint  Patient presents with  . Pulm Consult    Referred by Dr. Saintclair Halsted for lung mass. Mass discovered during recent OV. States he does have some SOB. Mass is possibly on the right side.      62 year old remote smoker presents for evaluation of lung mass. 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  He has had several back surgeries and required several spinal stimulators.  He was found to have cervical disc disease and plan was to have cervical fusion surgery.  Preop chest x-ray showed right upper lobe lung mass.  Surgery was originally scheduled at [facility] by Dr. Carloyn Manner  CT chest without contrast was performed on 12/6.  I have personally reviewed images with the patient.  This showed 40 x 30 mm angulated right upper lobe lung mass associated with 24 mm precarinal lymph node.  No pleural effusion was noted. His surgery has now been rescheduled at [facility] by Dr. Saintclair Halsted on 12/16  He presents in a wheelchair since he has poor balance.  He reports some numbness of both arms and feet and weakness.  He has OSA for which she is compliant with nasal pillows. He denies shortness of breath or wheezing or recurrent chest colds.  His only complaint is a hoarse voice which developed acutely and he has been given Levaquin for 10 days.  He denies preceding URI symptoms  There is no weight loss. He smoked for about 20 years starting at age 10 up to 77 years up to 2 packs/day.   family history of brain tumor in his mother and lupus     Past Medical History:  Diagnosis Date  . Chronic back pain    Past Surgical History:  Procedure Laterality Date  . BACK SURGERY      Allergies  Allergen Reactions  . Iohexol      Desc: PT STATES THAT WHEN GIVEN THE CONTRAST HIS LEGS BURN AND HE PASSES OUT, COMA FOR 7DAY. DR Thornton Papas WOULD NOT PRE MEDICATE DUE  TO SEVERITY OF REACTION, STUDY WAS CX       Social History   Socioeconomic History  . Marital status: Married    Spouse name: Not on file  . Number of children: Not on file  . Years of education: Not on file  . Highest education level: Not on file  Occupational History  . Not on file  Social Needs  . Financial resource strain: Not on file  . Food insecurity:    Worry: Not on file    Inability: Not on file  . Transportation needs:    Medical: Not on file    Non-medical: Not on file  Tobacco Use  . Smoking status: Never Smoker  . Smokeless tobacco: Never Used  Substance and Sexual Activity  . Alcohol use: No  . Drug use: No  . Sexual activity: Not on file  Lifestyle  . Physical activity:    Days per week: Not on file    Minutes per session: Not on file  . Stress: Not on file  Relationships  . Social connections:    Talks on phone: Not on file    Gets together: Not on file    Attends religious service: Not on file    Active member of club or organization:  Not on file    Attends meetings of clubs or organizations: Not on file    Relationship status: Not on file  . Intimate partner violence:    Fear of current or ex partner: Not on file    Emotionally abused: Not on file    Physically abused: Not on file    Forced sexual activity: Not on file  Other Topics Concern  . Not on file  Social History Narrative  . Not on file      No family history on file.    Review of Systems  Constitutional: Positive for appetite change. Negative for fever and unexpected weight change.  HENT: Positive for sore throat. Negative for congestion, dental problem, ear pain, nosebleeds, postnasal drip, rhinorrhea, sinus pressure, sneezing and trouble swallowing.   Eyes: Negative for redness and itching.  Respiratory: Positive for shortness of breath. Negative for cough, chest tightness and wheezing.   Cardiovascular: Positive for chest pain. Negative for palpitations and leg  swelling.  Gastrointestinal: Negative for nausea and vomiting.  Genitourinary: Negative for dysuria.  Musculoskeletal: Positive for joint swelling.  Skin: Negative for rash.  Allergic/Immunologic: Negative.  Negative for environmental allergies, food allergies and immunocompromised state.  Neurological: Positive for headaches.  Hematological: Does not bruise/bleed easily.  Psychiatric/Behavioral: Positive for dysphoric mood. The patient is nervous/anxious.        Objective:   Physical Exam  Gen. Pleasant, obese, in no distress, normal affect ENT - no pallor or icterus, no post nasal drip, class 2-3 airway Neck: No JVD, no thyromegaly, no carotid bruits Lungs: no use of accessory muscles, no dullness to percussion, decreased without rales or rhonchi  Cardiovascular: Rhythm regular, heart sounds  normal, no murmurs or gallops, no peripheral edema Abdomen: soft and non-tender, no hepatosplenomegaly, BS normal. Musculoskeletal: No deformities, no cyanosis or clubbing Neuro:  alert, non focal, no tremors       Assessment & Plan:

## 2018-10-22 ENCOUNTER — Telehealth: Payer: Self-pay | Admitting: Pulmonary Disease

## 2018-10-22 NOTE — Telephone Encounter (Addendum)
Working on it right now to see what I can get this week.  Per Peggy w/ [facility], she asked me to call back tomorrow AM.  I will call first thing.

## 2018-10-22 NOTE — Telephone Encounter (Signed)
[place], can we get his PET scan this week? He is scheduled for surgery on Monday & we can try to do EBUS at the same time

## 2018-10-23 NOTE — Telephone Encounter (Signed)
I have got the patient scheduled at [facility], 12/13 @ 12:00, check in at 11:30 NPO 6 hours.  I have called the patient & made patient aware.

## 2018-10-23 NOTE — Telephone Encounter (Signed)
I discussed with Dr. Saintclair Halsted and Dr Gala Murdoch should be able to do E bus on Monday-so that we can perform under same general anesthesia for his neck procedure

## 2018-10-24 ENCOUNTER — Ambulatory Visit (HOSPITAL_COMMUNITY)
Admission: RE | Admit: 2018-10-24 | Discharge: 2018-10-24 | Disposition: A | Discharge status: Home / Self Care | Payer: Medicare Other | Source: Ambulatory Visit | Attending: Pulmonary Disease | Admitting: Pulmonary Disease

## 2018-10-24 ENCOUNTER — Institutional Professional Consult (permissible substitution): Payer: Self-pay | Admitting: Pulmonary Disease

## 2018-10-24 ENCOUNTER — Encounter (HOSPITAL_COMMUNITY): Payer: Self-pay | Admitting: *Deleted

## 2018-10-24 ENCOUNTER — Telehealth: Payer: Self-pay | Admitting: Pulmonary Disease

## 2018-10-24 ENCOUNTER — Other Ambulatory Visit: Payer: Self-pay

## 2018-10-24 DIAGNOSIS — G8929 Other chronic pain: Secondary | ICD-10-CM | POA: Diagnosis not present

## 2018-10-24 DIAGNOSIS — M2578 Osteophyte, vertebrae: Secondary | ICD-10-CM | POA: Diagnosis not present

## 2018-10-24 DIAGNOSIS — Z791 Long term (current) use of non-steroidal anti-inflammatories (NSAID): Secondary | ICD-10-CM | POA: Diagnosis not present

## 2018-10-24 DIAGNOSIS — R7303 Prediabetes: Secondary | ICD-10-CM | POA: Diagnosis not present

## 2018-10-24 DIAGNOSIS — Z993 Dependence on wheelchair: Secondary | ICD-10-CM | POA: Diagnosis not present

## 2018-10-24 DIAGNOSIS — R918 Other nonspecific abnormal finding of lung field: Secondary | ICD-10-CM | POA: Insufficient documentation

## 2018-10-24 DIAGNOSIS — Z9989 Dependence on other enabling machines and devices: Secondary | ICD-10-CM | POA: Diagnosis not present

## 2018-10-24 DIAGNOSIS — M4802 Spinal stenosis, cervical region: Secondary | ICD-10-CM | POA: Diagnosis not present

## 2018-10-24 DIAGNOSIS — H9190 Unspecified hearing loss, unspecified ear: Secondary | ICD-10-CM | POA: Diagnosis not present

## 2018-10-24 DIAGNOSIS — I10 Essential (primary) hypertension: Secondary | ICD-10-CM | POA: Diagnosis not present

## 2018-10-24 DIAGNOSIS — Z91041 Radiographic dye allergy status: Secondary | ICD-10-CM | POA: Diagnosis not present

## 2018-10-24 DIAGNOSIS — Z79899 Other long term (current) drug therapy: Secondary | ICD-10-CM | POA: Diagnosis not present

## 2018-10-24 DIAGNOSIS — Z87891 Personal history of nicotine dependence: Secondary | ICD-10-CM | POA: Diagnosis not present

## 2018-10-24 DIAGNOSIS — M4712 Other spondylosis with myelopathy, cervical region: Secondary | ICD-10-CM | POA: Diagnosis not present

## 2018-10-24 DIAGNOSIS — C3490 Malignant neoplasm of unspecified part of unspecified bronchus or lung: Secondary | ICD-10-CM | POA: Diagnosis not present

## 2018-10-24 DIAGNOSIS — G4733 Obstructive sleep apnea (adult) (pediatric): Secondary | ICD-10-CM | POA: Diagnosis not present

## 2018-10-24 LAB — GLUCOSE, CAPILLARY: Glucose-Capillary: 88 mg/dL (ref 70–99)

## 2018-10-24 MED ORDER — FLUDEOXYGLUCOSE F - 18 (FDG) INJECTION
14.1000 | Freq: Once | INTRAVENOUS | Status: AC | PRN
  Administered 2018-10-24: 14.1 via INTRAVENOUS

## 2018-10-24 NOTE — Progress Notes (Signed)
Pt SDW-pre-op call completed by pt spouse, [name] (DPR). Spouse denies that pt C/O chest pain. Spouse denies that pt is under the care of a cardiologist. Spouse denies that pt had an echo, stress test and cardiac cath. Spouse stated that labs, EKG and cjhest x ray were al preformed at [facility] in [place] on 10/16/18 at pre-op appointment; records requested. Spouse made aware to have pt stop taking  Aspirin, vitamins, fish oil, Sudafed and herbal medications. Do not take any NSAIDs ie: Ibuprofen, Advil, Naproxen (Aleve), Motrin, BC and Goody Powder or any medication containing Aspirin. Spouse verbalized understanding of all pre-op instructions.

## 2018-10-24 NOTE — Telephone Encounter (Signed)
Called and spoke with Alexander Hatfield she stated Dr. Saintclair Halsted was made aware that Dr. Elsworth Soho is wanting Dr. Ander Slade to perform an EBUS and bronch during the same time as patients neck procedure so that it is done under the same anesthesia. They want to make sure that this has been scheduled and everything is prepared. [facility] can you check into this? Thank you.

## 2018-10-24 NOTE — Telephone Encounter (Signed)
Spoke to [facility] she has allowed time on the schedule for Dr Hermina Staggers to do the Bronch/EBUS 10/27/18@1 :30pm in OR 18 spoke to Maudie Mercury in scheduling and prodecure is set up no auth required pt notified by Lorriane Shire Dr Saintclair Halsted office Joellen [place]

## 2018-10-24 NOTE — Telephone Encounter (Signed)
Lorriane Shire with Dr. Windy Carina office called back stating she wanted to know what procedure that R. Elsworth Soho would be doing for patient on 10-27-18. Lorriane Shire is aware to contact Elsworth Soho on his phone as he is covering the hospital today and should be able to work out the details with them. Nothing more needed at this time.

## 2018-10-24 NOTE — Telephone Encounter (Signed)
Alexander Hatfield with Dr. Windy Carina office calling stating that Dr. Elsworth Soho asked that she call and speak with Alexander Hatfield about coordinating bronch and EBUS to be scheduled for Monday, 10/27/2018.  Alexander Hatfield, please call Alexander Hatfield at [phone] ext.[name]

## 2018-10-27 ENCOUNTER — Encounter (HOSPITAL_COMMUNITY)
Admission: RE | Disposition: A | Discharge status: Home Health | Payer: Self-pay | Source: Home / Self Care | Attending: Neurosurgery

## 2018-10-27 ENCOUNTER — Inpatient Hospital Stay (HOSPITAL_COMMUNITY)
Admission: RE | Admit: 2018-10-27 | Discharge: 2018-10-30 | DRG: 473 | Disposition: A | Discharge status: Home Health | Payer: Medicare Other | Attending: Neurosurgery | Admitting: Neurosurgery

## 2018-10-27 ENCOUNTER — Other Ambulatory Visit: Payer: Self-pay

## 2018-10-27 ENCOUNTER — Inpatient Hospital Stay (HOSPITAL_COMMUNITY): Payer: Medicare Other | Admitting: Certified Registered"

## 2018-10-27 ENCOUNTER — Encounter (HOSPITAL_COMMUNITY): Payer: Self-pay | Admitting: Certified Registered"

## 2018-10-27 ENCOUNTER — Inpatient Hospital Stay (HOSPITAL_COMMUNITY): Payer: Medicare Other

## 2018-10-27 DIAGNOSIS — I10 Essential (primary) hypertension: Secondary | ICD-10-CM | POA: Diagnosis not present

## 2018-10-27 DIAGNOSIS — E669 Obesity, unspecified: Secondary | ICD-10-CM | POA: Diagnosis present

## 2018-10-27 DIAGNOSIS — G959 Disease of spinal cord, unspecified: Secondary | ICD-10-CM | POA: Diagnosis not present

## 2018-10-27 DIAGNOSIS — Z993 Dependence on wheelchair: Secondary | ICD-10-CM

## 2018-10-27 DIAGNOSIS — J939 Pneumothorax, unspecified: Secondary | ICD-10-CM

## 2018-10-27 DIAGNOSIS — M2578 Osteophyte, vertebrae: Secondary | ICD-10-CM | POA: Diagnosis present

## 2018-10-27 DIAGNOSIS — Z79899 Other long term (current) drug therapy: Secondary | ICD-10-CM | POA: Diagnosis not present

## 2018-10-27 DIAGNOSIS — H9190 Unspecified hearing loss, unspecified ear: Secondary | ICD-10-CM | POA: Diagnosis present

## 2018-10-27 DIAGNOSIS — G4733 Obstructive sleep apnea (adult) (pediatric): Secondary | ICD-10-CM | POA: Diagnosis present

## 2018-10-27 DIAGNOSIS — Z791 Long term (current) use of non-steroidal anti-inflammatories (NSAID): Secondary | ICD-10-CM

## 2018-10-27 DIAGNOSIS — M4682 Other specified inflammatory spondylopathies, cervical region: Secondary | ICD-10-CM | POA: Diagnosis not present

## 2018-10-27 DIAGNOSIS — Z91041 Radiographic dye allergy status: Secondary | ICD-10-CM | POA: Diagnosis not present

## 2018-10-27 DIAGNOSIS — M4322 Fusion of spine, cervical region: Secondary | ICD-10-CM | POA: Diagnosis not present

## 2018-10-27 DIAGNOSIS — C801 Malignant (primary) neoplasm, unspecified: Secondary | ICD-10-CM | POA: Diagnosis not present

## 2018-10-27 DIAGNOSIS — M4712 Other spondylosis with myelopathy, cervical region: Principal | ICD-10-CM | POA: Diagnosis present

## 2018-10-27 DIAGNOSIS — G8929 Other chronic pain: Secondary | ICD-10-CM | POA: Diagnosis present

## 2018-10-27 DIAGNOSIS — Z419 Encounter for procedure for purposes other than remedying health state, unspecified: Secondary | ICD-10-CM

## 2018-10-27 DIAGNOSIS — Z9989 Dependence on other enabling machines and devices: Secondary | ICD-10-CM

## 2018-10-27 DIAGNOSIS — Z6838 Body mass index (BMI) 38.0-38.9, adult: Secondary | ICD-10-CM

## 2018-10-27 DIAGNOSIS — R7303 Prediabetes: Secondary | ICD-10-CM | POA: Diagnosis present

## 2018-10-27 DIAGNOSIS — R918 Other nonspecific abnormal finding of lung field: Secondary | ICD-10-CM | POA: Diagnosis present

## 2018-10-27 DIAGNOSIS — C771 Secondary and unspecified malignant neoplasm of intrathoracic lymph nodes: Secondary | ICD-10-CM | POA: Diagnosis not present

## 2018-10-27 DIAGNOSIS — M4802 Spinal stenosis, cervical region: Secondary | ICD-10-CM | POA: Diagnosis not present

## 2018-10-27 DIAGNOSIS — Z87891 Personal history of nicotine dependence: Secondary | ICD-10-CM | POA: Diagnosis not present

## 2018-10-27 DIAGNOSIS — R591 Generalized enlarged lymph nodes: Secondary | ICD-10-CM | POA: Diagnosis not present

## 2018-10-27 HISTORY — DX: Headache, unspecified: R51.9

## 2018-10-27 HISTORY — PX: VIDEO BRONCHOSCOPY WITH ENDOBRONCHIAL ULTRASOUND: SHX6177

## 2018-10-27 HISTORY — DX: Other nonspecific abnormal finding of lung field: R91.8

## 2018-10-27 HISTORY — DX: Headache: R51

## 2018-10-27 HISTORY — DX: Presence of dental prosthetic device (complete) (partial): Z97.2

## 2018-10-27 HISTORY — PX: ANTERIOR CERVICAL DECOMP/DISCECTOMY FUSION: SHX1161

## 2018-10-27 HISTORY — DX: Prediabetes: R73.03

## 2018-10-27 HISTORY — DX: Essential (primary) hypertension: I10

## 2018-10-27 HISTORY — DX: Presence of spectacles and contact lenses: Z97.3

## 2018-10-27 HISTORY — DX: Unspecified osteoarthritis, unspecified site: M19.90

## 2018-10-27 HISTORY — DX: Sleep apnea, unspecified: G47.30

## 2018-10-27 HISTORY — DX: Unspecified hearing loss, unspecified ear: H91.90

## 2018-10-27 LAB — CBC
HCT: 40.3 % (ref 39.0–52.0)
Hemoglobin: 12.7 g/dL — ABNORMAL LOW (ref 13.0–17.0)
MCH: 29.1 pg (ref 26.0–34.0)
MCHC: 31.5 g/dL (ref 30.0–36.0)
MCV: 92.4 fL (ref 80.0–100.0)
Platelets: 286 10*3/uL (ref 150–400)
RBC: 4.36 MIL/uL (ref 4.22–5.81)
RDW: 12.7 % (ref 11.5–15.5)
WBC: 11.1 10*3/uL — ABNORMAL HIGH (ref 4.0–10.5)
nRBC: 0 % (ref 0.0–0.2)

## 2018-10-27 LAB — COMPREHENSIVE METABOLIC PANEL
ALT: 10 U/L (ref 0–44)
AST: 17 U/L (ref 15–41)
Albumin: 3.5 g/dL (ref 3.5–5.0)
Alkaline Phosphatase: 85 U/L (ref 38–126)
Anion gap: 10 (ref 5–15)
BUN: 10 mg/dL (ref 8–23)
CO2: 25 mmol/L (ref 22–32)
Calcium: 9.2 mg/dL (ref 8.9–10.3)
Chloride: 106 mmol/L (ref 98–111)
Creatinine, Ser: 0.9 mg/dL (ref 0.61–1.24)
GFR calc Af Amer: >60 mL/min (ref >60)
GFR calc non Af Amer: >60 mL/min (ref >60)
Glucose, Bld: 96 mg/dL (ref 70–99)
Potassium: 3.9 mmol/L (ref 3.5–5.1)
Sodium: 141 mmol/L (ref 135–145)
Total Bilirubin: 0.5 mg/dL (ref 0.3–1.2)
Total Protein: 6.8 g/dL (ref 6.5–8.1)

## 2018-10-27 LAB — PROTIME-INR
INR: 1.01
Prothrombin Time: 13.3 seconds (ref 11.4–15.2)

## 2018-10-27 LAB — GLUCOSE, CAPILLARY: Glucose-Capillary: 135 mg/dL — ABNORMAL HIGH (ref 70–99)

## 2018-10-27 LAB — APTT: aPTT: 35 seconds (ref 24–36)

## 2018-10-27 SURGERY — ANTERIOR CERVICAL DECOMPRESSION/DISCECTOMY FUSION 2 LEVELS
Anesthesia: General

## 2018-10-27 MED ORDER — DEXAMETHASONE SODIUM PHOSPHATE 10 MG/ML IJ SOLN
INTRAMUSCULAR | Status: AC
  Filled 2018-10-27: qty 1

## 2018-10-27 MED ORDER — PHENOL 1.4 % MT LIQD: 1.0000 | OROMUCOSAL | Status: DC | PRN

## 2018-10-27 MED ORDER — PSEUDOEPHEDRINE HCL ER 120 MG PO TB12
120.0000 mg | ORAL_TABLET | Freq: Two times a day (BID) | ORAL | Status: DC | PRN
  Filled 2018-10-27: qty 1

## 2018-10-27 MED ORDER — FENTANYL CITRATE (PF) 100 MCG/2ML IJ SOLN
INTRAMUSCULAR | Status: AC
  Filled 2018-10-27: qty 2

## 2018-10-27 MED ORDER — OXYCODONE HCL 5 MG/5ML PO SOLN: 5.0000 mg | Freq: Once | ORAL | Status: AC | PRN

## 2018-10-27 MED ORDER — MIDAZOLAM HCL 5 MG/5ML IJ SOLN
INTRAMUSCULAR | Status: DC | PRN
  Administered 2018-10-27: 2 mg via INTRAVENOUS

## 2018-10-27 MED ORDER — METHOCARBAMOL 1000 MG/10ML IJ SOLN
1000.0000 mg | Freq: Once | INTRAVENOUS | Status: AC
  Administered 2018-10-27: 1000 mg via INTRAVENOUS
  Filled 2018-10-27: qty 10

## 2018-10-27 MED ORDER — DEXTROSE 5 % IV SOLN
3.0000 g | INTRAVENOUS | Status: AC
  Administered 2018-10-27: 3 g via INTRAVENOUS
  Filled 2018-10-27: qty 3

## 2018-10-27 MED ORDER — SODIUM CHLORIDE 0.9 % IV SOLN
INTRAVENOUS | Status: DC | PRN
  Administered 2018-10-27: 17:00:00

## 2018-10-27 MED ORDER — ROCURONIUM BROMIDE 50 MG/5ML IV SOSY
PREFILLED_SYRINGE | INTRAVENOUS | Status: AC
  Filled 2018-10-27: qty 5

## 2018-10-27 MED ORDER — MIDAZOLAM BOLUS VIA INFUSION
1.0000 mg | Freq: Once | INTRAVENOUS | Status: DC
  Filled 2018-10-27: qty 1

## 2018-10-27 MED ORDER — OXYCODONE HCL 5 MG PO TABS
30.0000 mg | ORAL_TABLET | Freq: Three times a day (TID) | ORAL | Status: DC
  Administered 2018-10-28 – 2018-10-30 (×7): 30 mg via ORAL
  Filled 2018-10-27 (×7): qty 6

## 2018-10-27 MED ORDER — ONDANSETRON HCL 4 MG/2ML IJ SOLN: 4.0000 mg | Freq: Once | INTRAMUSCULAR | Status: DC | PRN

## 2018-10-27 MED ORDER — DEXTROSE 5 % IV SOLN
3.0000 g | Freq: Three times a day (TID) | INTRAVENOUS | Status: AC
  Administered 2018-10-27 – 2018-10-28 (×2): 3 g via INTRAVENOUS
  Filled 2018-10-27 (×2): qty 3

## 2018-10-27 MED ORDER — BACLOFEN 10 MG PO TABS: 20.0000 mg | ORAL_TABLET | Freq: Three times a day (TID) | ORAL | Status: DC | PRN

## 2018-10-27 MED ORDER — FENTANYL CITRATE (PF) 100 MCG/2ML IJ SOLN
25.0000 ug | INTRAMUSCULAR | Status: DC | PRN
  Administered 2018-10-27 (×3): 50 ug via INTRAVENOUS

## 2018-10-27 MED ORDER — METHOCARBAMOL 1000 MG/10ML IJ SOLN: 1000.0000 mg | Freq: Three times a day (TID) | INTRAVENOUS | Status: DC | PRN

## 2018-10-27 MED ORDER — ROCURONIUM BROMIDE 100 MG/10ML IV SOLN
INTRAVENOUS | Status: DC | PRN
  Administered 2018-10-27 (×2): 50 mg via INTRAVENOUS

## 2018-10-27 MED ORDER — HEMOSTATIC AGENTS (NO CHARGE) OPTIME
TOPICAL | Status: DC | PRN
  Administered 2018-10-27: 1 via TOPICAL

## 2018-10-27 MED ORDER — ACETAMINOPHEN 650 MG RE SUPP: 650.0000 mg | RECTAL | Status: DC | PRN

## 2018-10-27 MED ORDER — LIDOCAINE 2% (20 MG/ML) 5 ML SYRINGE
INTRAMUSCULAR | Status: AC
  Filled 2018-10-27: qty 5

## 2018-10-27 MED ORDER — PROPOFOL 10 MG/ML IV BOLUS
INTRAVENOUS | Status: AC
  Filled 2018-10-27: qty 20

## 2018-10-27 MED ORDER — LIDOCAINE 2% (20 MG/ML) 5 ML SYRINGE
INTRAMUSCULAR | Status: DC | PRN
  Administered 2018-10-27: 80 mg via INTRAVENOUS

## 2018-10-27 MED ORDER — MIDAZOLAM HCL 2 MG/2ML IJ SOLN
INTRAMUSCULAR | Status: AC
  Administered 2018-10-27: 1 mg via INTRAVENOUS
  Filled 2018-10-27: qty 2

## 2018-10-27 MED ORDER — ONDANSETRON HCL 4 MG/2ML IJ SOLN
INTRAMUSCULAR | Status: AC
  Filled 2018-10-27: qty 2

## 2018-10-27 MED ORDER — MIDAZOLAM HCL 2 MG/2ML IJ SOLN
INTRAMUSCULAR | Status: AC
  Filled 2018-10-27: qty 2

## 2018-10-27 MED ORDER — DEXAMETHASONE SODIUM PHOSPHATE 10 MG/ML IJ SOLN
10.0000 mg | INTRAMUSCULAR | Status: AC
  Administered 2018-10-27: 10 mg via INTRAVENOUS

## 2018-10-27 MED ORDER — LEVOFLOXACIN 500 MG PO TABS: 500.0000 mg | ORAL_TABLET | Freq: Every day | ORAL | Status: DC

## 2018-10-27 MED ORDER — HYDROMORPHONE HCL 1 MG/ML IJ SOLN
0.5000 mg | INTRAMUSCULAR | Status: DC | PRN
  Administered 2018-10-27 – 2018-10-29 (×3): 0.5 mg via INTRAVENOUS
  Filled 2018-10-27 (×2): qty 0.5

## 2018-10-27 MED ORDER — SUGAMMADEX SODIUM 500 MG/5ML IV SOLN
INTRAVENOUS | Status: AC
  Filled 2018-10-27: qty 5

## 2018-10-27 MED ORDER — EPHEDRINE SULFATE-NACL 50-0.9 MG/10ML-% IV SOSY
PREFILLED_SYRINGE | INTRAVENOUS | Status: DC | PRN
  Administered 2018-10-27: 10 mg via INTRAVENOUS

## 2018-10-27 MED ORDER — CEFAZOLIN SODIUM-DEXTROSE 2-4 GM/100ML-% IV SOLN
INTRAVENOUS | Status: AC
  Filled 2018-10-27: qty 100

## 2018-10-27 MED ORDER — EPHEDRINE 5 MG/ML INJ
INTRAVENOUS | Status: AC
  Filled 2018-10-27: qty 10

## 2018-10-27 MED ORDER — THROMBIN 5000 UNITS EX SOLR
CUTANEOUS | Status: AC
  Filled 2018-10-27: qty 15000

## 2018-10-27 MED ORDER — 0.9 % SODIUM CHLORIDE (POUR BTL) OPTIME
TOPICAL | Status: DC | PRN
  Administered 2018-10-27 (×2): 1000 mL

## 2018-10-27 MED ORDER — OXYCODONE HCL 5 MG PO TABS
10.0000 mg | ORAL_TABLET | ORAL | Status: DC | PRN
  Administered 2018-10-28 – 2018-10-29 (×3): 10 mg via ORAL
  Filled 2018-10-27 (×2): qty 2

## 2018-10-27 MED ORDER — ALUM & MAG HYDROXIDE-SIMETH 200-200-20 MG/5ML PO SUSP: 30.0000 mL | Freq: Four times a day (QID) | ORAL | Status: DC | PRN

## 2018-10-27 MED ORDER — THROMBIN 5000 UNITS EX SOLR
CUTANEOUS | Status: DC | PRN
  Administered 2018-10-27 (×2): 5000 [IU] via TOPICAL

## 2018-10-27 MED ORDER — SUCCINYLCHOLINE CHLORIDE 200 MG/10ML IV SOSY
PREFILLED_SYRINGE | INTRAVENOUS | Status: DC | PRN
  Administered 2018-10-27: 120 mg via INTRAVENOUS

## 2018-10-27 MED ORDER — POTASSIUM CHLORIDE CRYS ER 20 MEQ PO TBCR
20.0000 meq | EXTENDED_RELEASE_TABLET | Freq: Every evening | ORAL | Status: DC
  Administered 2018-10-28 – 2018-10-29 (×2): 20 meq via ORAL
  Filled 2018-10-27 (×2): qty 1

## 2018-10-27 MED ORDER — CYCLOBENZAPRINE HCL 10 MG PO TABS
10.0000 mg | ORAL_TABLET | Freq: Three times a day (TID) | ORAL | Status: DC | PRN
  Administered 2018-10-27 – 2018-10-30 (×4): 10 mg via ORAL
  Filled 2018-10-27 (×4): qty 1

## 2018-10-27 MED ORDER — LOSARTAN POTASSIUM 50 MG PO TABS
100.0000 mg | ORAL_TABLET | Freq: Every day | ORAL | Status: DC
  Administered 2018-10-28 – 2018-10-30 (×3): 100 mg via ORAL
  Filled 2018-10-27 (×3): qty 2

## 2018-10-27 MED ORDER — SODIUM CHLORIDE 0.9% FLUSH
3.0000 mL | Freq: Two times a day (BID) | INTRAVENOUS | Status: DC
  Administered 2018-10-28 – 2018-10-30 (×4): 3 mL via INTRAVENOUS

## 2018-10-27 MED ORDER — ACETAMINOPHEN 10 MG/ML IV SOLN
INTRAVENOUS | Status: AC
  Filled 2018-10-27: qty 100

## 2018-10-27 MED ORDER — ONDANSETRON HCL 4 MG PO TABS: 4.0000 mg | ORAL_TABLET | Freq: Four times a day (QID) | ORAL | Status: DC | PRN

## 2018-10-27 MED ORDER — PROPOFOL 10 MG/ML IV BOLUS
INTRAVENOUS | Status: DC | PRN
  Administered 2018-10-27: 50 mg via INTRAVENOUS
  Administered 2018-10-27: 200 mg via INTRAVENOUS

## 2018-10-27 MED ORDER — ONDANSETRON HCL 4 MG/2ML IJ SOLN
INTRAMUSCULAR | Status: DC | PRN
  Administered 2018-10-27: 4 mg via INTRAVENOUS

## 2018-10-27 MED ORDER — CYCLOBENZAPRINE HCL 10 MG PO TABS
ORAL_TABLET | ORAL | Status: AC
  Filled 2018-10-27: qty 1

## 2018-10-27 MED ORDER — OXYCODONE HCL 5 MG PO TABS
ORAL_TABLET | ORAL | Status: AC
  Filled 2018-10-27: qty 1

## 2018-10-27 MED ORDER — AMLODIPINE BESYLATE 10 MG PO TABS
10.0000 mg | ORAL_TABLET | Freq: Every day | ORAL | Status: DC
  Administered 2018-10-28 – 2018-10-30 (×3): 10 mg via ORAL
  Filled 2018-10-27 (×3): qty 1

## 2018-10-27 MED ORDER — SUGAMMADEX SODIUM 200 MG/2ML IV SOLN
INTRAVENOUS | Status: DC | PRN
  Administered 2018-10-27: 300 mg via INTRAVENOUS

## 2018-10-27 MED ORDER — SUCCINYLCHOLINE CHLORIDE 200 MG/10ML IV SOSY
PREFILLED_SYRINGE | INTRAVENOUS | Status: AC
  Filled 2018-10-27: qty 10

## 2018-10-27 MED ORDER — MENTHOL 3 MG MT LOZG: 1.0000 | LOZENGE | OROMUCOSAL | Status: DC | PRN

## 2018-10-27 MED ORDER — PHENYLEPHRINE 40 MCG/ML (10ML) SYRINGE FOR IV PUSH (FOR BLOOD PRESSURE SUPPORT)
PREFILLED_SYRINGE | INTRAVENOUS | Status: AC
  Filled 2018-10-27: qty 10

## 2018-10-27 MED ORDER — SODIUM CHLORIDE 0.9 % IV SOLN
INTRAVENOUS | Status: DC | PRN
  Administered 2018-10-27: 50 ug/min via INTRAVENOUS

## 2018-10-27 MED ORDER — SODIUM CHLORIDE 0.9 % IV SOLN: 250.0000 mL | INTRAVENOUS | Status: DC

## 2018-10-27 MED ORDER — PANTOPRAZOLE SODIUM 40 MG IV SOLR
40.0000 mg | Freq: Every day | INTRAVENOUS | Status: DC
  Filled 2018-10-27: qty 40

## 2018-10-27 MED ORDER — METHOCARBAMOL 1000 MG/10ML IJ SOLN: 1000.0000 mg | Freq: Three times a day (TID) | INTRAMUSCULAR | Status: DC

## 2018-10-27 MED ORDER — MIDAZOLAM HCL 2 MG/2ML IJ SOLN: INTRAMUSCULAR | Status: DC | PRN

## 2018-10-27 MED ORDER — THROMBIN 5000 UNITS EX SOLR
OROMUCOSAL | Status: DC | PRN
  Administered 2018-10-27: 17:00:00 via TOPICAL

## 2018-10-27 MED ORDER — ACETAMINOPHEN 10 MG/ML IV SOLN
1000.0000 mg | Freq: Four times a day (QID) | INTRAVENOUS | Status: DC
  Administered 2018-10-27: 1000 mg via INTRAVENOUS

## 2018-10-27 MED ORDER — PHENYLEPHRINE 40 MCG/ML (10ML) SYRINGE FOR IV PUSH (FOR BLOOD PRESSURE SUPPORT)
PREFILLED_SYRINGE | INTRAVENOUS | Status: DC | PRN
  Administered 2018-10-27 (×3): 120 ug via INTRAVENOUS

## 2018-10-27 MED ORDER — DEXAMETHASONE SODIUM PHOSPHATE 10 MG/ML IJ SOLN
10.0000 mg | Freq: Four times a day (QID) | INTRAMUSCULAR | Status: DC
  Administered 2018-10-27 – 2018-10-30 (×12): 10 mg via INTRAVENOUS
  Filled 2018-10-27 (×12): qty 1

## 2018-10-27 MED ORDER — SODIUM CHLORIDE 0.9% FLUSH: 3.0000 mL | INTRAVENOUS | Status: DC | PRN

## 2018-10-27 MED ORDER — ACETAMINOPHEN 325 MG PO TABS: 650.0000 mg | ORAL_TABLET | ORAL | Status: DC | PRN

## 2018-10-27 MED ORDER — ONDANSETRON HCL 4 MG/2ML IJ SOLN: 4.0000 mg | Freq: Four times a day (QID) | INTRAMUSCULAR | Status: DC | PRN

## 2018-10-27 MED ORDER — LACTATED RINGERS IV SOLN
INTRAVENOUS | Status: DC | PRN
  Administered 2018-10-27 (×2): via INTRAVENOUS

## 2018-10-27 MED ORDER — GABAPENTIN 300 MG PO CAPS
600.0000 mg | ORAL_CAPSULE | Freq: Three times a day (TID) | ORAL | Status: DC
  Administered 2018-10-28 – 2018-10-30 (×8): 600 mg via ORAL
  Filled 2018-10-27 (×8): qty 2

## 2018-10-27 MED ORDER — CHLORHEXIDINE GLUCONATE CLOTH 2 % EX PADS: 6.0000 | MEDICATED_PAD | Freq: Once | CUTANEOUS | Status: DC

## 2018-10-27 MED ORDER — OXYCODONE HCL 5 MG PO TABS
5.0000 mg | ORAL_TABLET | Freq: Once | ORAL | Status: AC | PRN
  Administered 2018-10-27: 5 mg via ORAL

## 2018-10-27 MED ORDER — FENTANYL CITRATE (PF) 100 MCG/2ML IJ SOLN
INTRAMUSCULAR | Status: DC | PRN
  Administered 2018-10-27: 150 ug via INTRAVENOUS
  Administered 2018-10-27 (×2): 50 ug via INTRAVENOUS

## 2018-10-27 MED ORDER — DEXMEDETOMIDINE HCL 200 MCG/2ML IV SOLN
60.0000 ug | Freq: Once | INTRAVENOUS | Status: AC
  Administered 2018-10-27: 60 ug via INTRAVENOUS

## 2018-10-27 MED ORDER — VECURONIUM BROMIDE 10 MG IV SOLR
INTRAVENOUS | Status: DC | PRN
  Administered 2018-10-27 (×2): 3 mg via INTRAVENOUS

## 2018-10-27 MED ORDER — FUROSEMIDE 20 MG PO TABS
20.0000 mg | ORAL_TABLET | Freq: Every day | ORAL | Status: DC
  Administered 2018-10-28 – 2018-10-30 (×3): 20 mg via ORAL
  Filled 2018-10-27 (×3): qty 1

## 2018-10-27 MED ORDER — FENTANYL CITRATE (PF) 250 MCG/5ML IJ SOLN
INTRAMUSCULAR | Status: AC
  Filled 2018-10-27: qty 5

## 2018-10-27 SURGICAL SUPPLY — 94 items
ADAPTER VALVE BIOPSY EBUS (MISCELLANEOUS) IMPLANT
ADPTR VALVE BIOPSY EBUS (MISCELLANEOUS)
BAG DECANTER FOR FLEXI CONT (MISCELLANEOUS) ×3 IMPLANT
BASKET BONE COLLECTION (BASKET) ×3 IMPLANT
BENZOIN TINCTURE PRP APPL 2/3 (GAUZE/BANDAGES/DRESSINGS) ×3 IMPLANT
BIT DRILL NEURO 2X3.1 SFT TUCH (MISCELLANEOUS) ×1 IMPLANT
BONE VIVIGEN FORMABLE 1.3CC (Bone Implant) ×6 IMPLANT
BRUSH CYTOL CELLEBRITY 1.5X140 (MISCELLANEOUS) IMPLANT
BUR MATCHSTICK NEURO 3.0 LAGG (BURR) ×3 IMPLANT
CANISTER SUCT 3000ML PPV (MISCELLANEOUS) ×6 IMPLANT
CARTRIDGE OIL MAESTRO DRILL (MISCELLANEOUS) ×1 IMPLANT
CLOSURE WOUND 1/2 X4 (GAUZE/BANDAGES/DRESSINGS) ×1
CONT SPEC 4OZ CLIKSEAL STRL BL (MISCELLANEOUS) ×3 IMPLANT
COVER BACK TABLE 60X90IN (DRAPES) ×3 IMPLANT
COVER WAND RF STERILE (DRAPES) ×1 IMPLANT
DERMABOND ADVANCED (GAUZE/BANDAGES/DRESSINGS) ×2
DERMABOND ADVANCED .7 DNX12 (GAUZE/BANDAGES/DRESSINGS) IMPLANT
DIFFUSER DRILL AIR PNEUMATIC (MISCELLANEOUS) ×3 IMPLANT
DRAPE C-ARM 42X72 X-RAY (DRAPES) ×6 IMPLANT
DRAPE LAPAROTOMY 100X72 PEDS (DRAPES) ×3 IMPLANT
DRAPE MICROSCOPE LEICA (MISCELLANEOUS) ×3 IMPLANT
DRILL NEURO 2X3.1 SOFT TOUCH (MISCELLANEOUS) ×3
DRSG OPSITE POSTOP 4X6 (GAUZE/BANDAGES/DRESSINGS) ×2 IMPLANT
DURAPREP 6ML APPLICATOR 50/CS (WOUND CARE) ×3 IMPLANT
ELECT COATED BLADE 2.86 ST (ELECTRODE) ×3 IMPLANT
ELECT REM PT RETURN 9FT ADLT (ELECTROSURGICAL) ×3
ELECTRODE REM PT RTRN 9FT ADLT (ELECTROSURGICAL) ×1 IMPLANT
EVACUATOR SILICONE 100CC (DRAIN) ×2 IMPLANT
FORCEPS BIOP RJ4 1.8 (CUTTING FORCEPS) IMPLANT
GAUZE 4X4 16PLY RFD (DISPOSABLE) IMPLANT
GAUZE SPONGE 4X4 12PLY STRL (GAUZE/BANDAGES/DRESSINGS) ×2 IMPLANT
GLOVE BIO SURGEON STRL SZ7 (GLOVE) ×4 IMPLANT
GLOVE BIO SURGEON STRL SZ8 (GLOVE) ×4 IMPLANT
GLOVE BIOGEL PI IND STRL 6.5 (GLOVE) IMPLANT
GLOVE BIOGEL PI IND STRL 7.0 (GLOVE) IMPLANT
GLOVE BIOGEL PI IND STRL 7.5 (GLOVE) IMPLANT
GLOVE BIOGEL PI INDICATOR 6.5 (GLOVE) ×2
GLOVE BIOGEL PI INDICATOR 7.0 (GLOVE) ×2
GLOVE BIOGEL PI INDICATOR 7.5 (GLOVE) ×4
GLOVE EXAM NITRILE XL STR (GLOVE) IMPLANT
GLOVE INDICATOR 8.5 STRL (GLOVE) ×3 IMPLANT
GLOVE SURG SS PI 6.0 STRL IVOR (GLOVE) ×4 IMPLANT
GOWN STRL REUS W/ TWL LRG LVL3 (GOWN DISPOSABLE) ×1 IMPLANT
GOWN STRL REUS W/ TWL XL LVL3 (GOWN DISPOSABLE) ×1 IMPLANT
GOWN STRL REUS W/TWL 2XL LVL3 (GOWN DISPOSABLE) IMPLANT
GOWN STRL REUS W/TWL LRG LVL3 (GOWN DISPOSABLE) ×4
GOWN STRL REUS W/TWL XL LVL3 (GOWN DISPOSABLE) ×2
GRAFT BNE MATRIX VG FRMBL SM 1 (Bone Implant) IMPLANT
HALTER HD/CHIN CERV TRACTION D (MISCELLANEOUS) ×3 IMPLANT
HEMOSTAT POWDER KIT SURGIFOAM (HEMOSTASIS) ×3 IMPLANT
KIT BASIN OR (CUSTOM PROCEDURE TRAY) ×3 IMPLANT
KIT CLEAN ENDO COMPLIANCE (KITS) ×6 IMPLANT
KIT TURNOVER KIT B (KITS) ×4 IMPLANT
MARKER SKIN DUAL TIP RULER LAB (MISCELLANEOUS) ×3 IMPLANT
NDL ASPIRATION VIZISHOT 19G (NEEDLE) IMPLANT
NDL ASPIRATION VIZISHOT 21G (NEEDLE) ×1 IMPLANT
NDL SPNL 20GX3.5 QUINCKE YW (NEEDLE) ×1 IMPLANT
NEEDLE ASPIRATION VIZISHOT 19G (NEEDLE) IMPLANT
NEEDLE ASPIRATION VIZISHOT 21G (NEEDLE) ×3 IMPLANT
NEEDLE SPNL 20GX3.5 QUINCKE YW (NEEDLE) ×3 IMPLANT
NS IRRIG 1000ML POUR BTL (IV SOLUTION) ×6 IMPLANT
OIL CARTRIDGE MAESTRO DRILL (MISCELLANEOUS) ×3
OIL SILICONE PENTAX (PARTS (SERVICE/REPAIRS)) ×3 IMPLANT
PACK LAMINECTOMY NEURO (CUSTOM PROCEDURE TRAY) ×3 IMPLANT
PAD ARMBOARD 7.5X6 YLW CONV (MISCELLANEOUS) ×15 IMPLANT
PIN DISTRACTION 14MM (PIN) ×4 IMPLANT
PLATE ANT CERV XTEND 2 LV 36 (Plate) ×2 IMPLANT
RUBBERBAND STERILE (MISCELLANEOUS) ×6 IMPLANT
SCREW XTEND SELF DRILL 4.6X16 (Screw) ×12 IMPLANT
SPACER ACDF TPS LG PARALLEL 7 (Spacer) ×2 IMPLANT
SPACER LORD COLONIAL 10 LG (Spacer) ×2 IMPLANT
SPONGE INTESTINAL PEANUT (DISPOSABLE) ×5 IMPLANT
SPONGE SURGIFOAM ABS GEL SZ50 (HEMOSTASIS) ×3 IMPLANT
STRIP CLOSURE SKIN 1/2X4 (GAUZE/BANDAGES/DRESSINGS) ×2 IMPLANT
SUT VIC AB 3-0 SH 8-18 (SUTURE) ×3 IMPLANT
SUT VICRYL 4-0 PS2 18IN ABS (SUTURE) ×3 IMPLANT
SYR 20CC LL (SYRINGE) ×6 IMPLANT
SYR 20ML ECCENTRIC (SYRINGE) ×6 IMPLANT
SYR 30ML LL (SYRINGE) ×3 IMPLANT
SYR 5ML LL (SYRINGE) ×2 IMPLANT
SYR 5ML LUER SLIP (SYRINGE) ×3 IMPLANT
TAPE CLOTH 4X10 WHT NS (GAUZE/BANDAGES/DRESSINGS) IMPLANT
TAPE STRIPS DRAPE STRL (GAUZE/BANDAGES/DRESSINGS) ×2 IMPLANT
TOWEL GREEN STERILE (TOWEL DISPOSABLE) ×3 IMPLANT
TOWEL GREEN STERILE FF (TOWEL DISPOSABLE) ×3 IMPLANT
TOWEL OR 17X24 6PK STRL BLUE (TOWEL DISPOSABLE) ×3 IMPLANT
TRAP SPECIMEN MUCOUS 40CC (MISCELLANEOUS) IMPLANT
TUBE CONNECTING 20'X1/4 (TUBING) ×2
TUBE CONNECTING 20X1/4 (TUBING) ×4 IMPLANT
UNDERPAD 30X30 (UNDERPADS AND DIAPERS) ×3 IMPLANT
VALVE BIOPSY  SINGLE USE (MISCELLANEOUS) ×2
VALVE BIOPSY SINGLE USE (MISCELLANEOUS) ×1 IMPLANT
VALVE SUCTION BRONCHIO DISP (MISCELLANEOUS) ×3 IMPLANT
WATER STERILE IRR 1000ML POUR (IV SOLUTION) ×4 IMPLANT

## 2018-10-27 NOTE — Anesthesia Postprocedure Evaluation (Signed)
Anesthesia Post Note  Patient: Jamonte Curfman.  Procedure(s) Performed: ANTERIOR CERVICAL THREE-FOUR, FOUR-FIVE DECOMPRESSION/DISCECTOMY FUSION TWO LEVELS (N/A ) VIDEO BRONCHOSCOPY WITH ENDOBRONCHIAL ULTRASOUND (N/A )     Patient location during evaluation: PACU Anesthesia Type: General Level of consciousness: awake and alert Pain management: pain level controlled Vital Signs Assessment: post-procedure vital signs reviewed and stable Respiratory status: spontaneous breathing, nonlabored ventilation, respiratory function stable and patient connected to nasal cannula oxygen Cardiovascular status: blood pressure returned to baseline and stable Postop Assessment: no apparent nausea or vomiting Anesthetic complications: no Comments: On nasal C-PAP awake and alert, post-op pain mild at present    Last Vitals:  Vitals:   10/27/18 2100 10/27/18 2115  BP: 116/73 117/81  Pulse: 81 82  Resp: 14 15  Temp:    SpO2: 92% 93%    Last Pain:  Vitals:   10/27/18 1930  TempSrc:   PainSc: 10-Worst pain ever                 Esaiah Wanless,Melecio COKER

## 2018-10-27 NOTE — Progress Notes (Signed)
Patient setup on CPAP and is waiting for his mask at this time. Patient doing well on our mask but couldn't tolerate with his nose being stuffy. Patient will be going to floor soon and will continue to use CPAP as tolerated

## 2018-10-27 NOTE — Transfer of Care (Signed)
Immediate Anesthesia Transfer of Care Note  Patient: Alexander Hatfield.  Procedure(s) Performed: ANTERIOR CERVICAL THREE-FOUR, FOUR-FIVE DECOMPRESSION/DISCECTOMY FUSION TWO LEVELS (N/A ) VIDEO BRONCHOSCOPY WITH ENDOBRONCHIAL ULTRASOUND (N/A )  Patient Location: PACU  Anesthesia Type:General  Level of Consciousness: awake and alert   Airway & Oxygen Therapy: Patient Spontanous Breathing and Patient connected to nasal cannula oxygen  Post-op Assessment: Report given to RN and Post -op Vital signs reviewed and stable  Post vital signs: Reviewed and stable  Last Vitals:  Vitals Value Taken Time  BP 143/80 10/27/2018  7:13 PM  Temp 36.4 C 10/27/2018  7:11 PM  Pulse 99 10/27/2018  7:17 PM  Resp 18 10/27/2018  7:17 PM  SpO2 89 % 10/27/2018  7:17 PM  Vitals shown include unvalidated device data.  Last Pain:  Vitals:   10/27/18 1132  TempSrc: Oral  PainSc: 5       Patients Stated Pain Goal: 3 (50/51/83 3582)  Complications: No apparent anesthesia complications

## 2018-10-27 NOTE — H&P (View-Only) (Signed)
Bronchoscopy rescheduled secondary to computer glitch  Family informed  Patient will tentatively be scheduled for 0830 on 10/29/2018 Bronchoscopy with endobronchial ultrasound

## 2018-10-27 NOTE — Interval H&P Note (Signed)
History and Physical Interval Note:  10/27/2018 1:23 PM  Alexander Hatfield.  has presented today for surgery, with the diagnosis of Stenosis  The various methods of treatment have been discussed with the patient and family. After consideration of risks, benefits and other options for treatment, the patient has consented to  Procedure(s): C3-4 C4-5 ANTERIOR CERVICAL DECOMPRESSION/DISCECTOMY FUSION 2 LEVELS (N/A) VIDEO BRONCHOSCOPY WITH ENDOBRONCHIAL ULTRASOUND (N/A) as a surgical intervention .  The patient's history has been reviewed, patient examined, no change in status, stable for surgery.  I have reviewed the patient's chart and labs.  Questions were answered to the patient's satisfaction.     [name] A Kemonie Cutillo

## 2018-10-27 NOTE — Progress Notes (Signed)
Bronchoscopy rescheduled secondary to computer glitch  Family informed  Patient will tentatively be scheduled for 0830 on 10/29/2018 Bronchoscopy with endobronchial ultrasound

## 2018-10-27 NOTE — Op Note (Signed)
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  EBL: Minimal  History of present illness: Patient is very pleasant 62 year old gym is a progress worsening myelopathy with weakness in his hands and legs difficulty walking workup revealed severe cervical stenosis C3-4 C4-5. Due to patient's progression of clinical syndrome imaging findings and failure conservative treatment I recommended anterior cervical discectomies and fusion at those 2 levels. I extensively went over the risks and benefits of the procedure with him as well as perioperative course expectations of outcome and alternatives of surgery and they understood and agreed to proceed forward.  Operative procedure: Patient brought into the or was induced on general anesthesia positioned supine the neck in slight extension in 5 pounds of halter traction the right-sided 6 prepped and draped in routine sterile fashion preoperative x-ray localize the appropriate level so a curvilinear incision was made just off midline to the intracortical the sternomastoid and superficially of the platysmas dissected and divided longitudinally the avascular plane between the sternomastoid and strap muscles was developed down to the prevertebral fascia. prevertebralFascia was dissected Programmer, multimedia.interoperative x-ray confirmed the position the C3-4 disc space level so annulotomy was made with a 15 blade scalpel marked the disc space lungs close reflected laterally over both disc space level and supper 10 retractors placed.  There was a large lateral osteophyte coming off the C4 vertebral body that I had to remove by Duane or to position the retractor. Both disc spaces were then drilled down capturing the bone shavings in a mucous trap another microscopical. Working at C3-4 disc space was further drilled down the posterior annulus which was teased off of the dura along with a posterior loss of ligament identifying very large disc herniation causing severe cord compression. This is all removed in piecemeal fashion decompress the central canal. Aggressive abutting both endplates and marking laterally to both C4 pedicles allowed adequate decompression disc space and spinal cord. This was packed with Gelfoam to second C4-5 and a similar fashion C4-5 was drilled down again aggressively under been both endplates removed a large large amount of disc material that had migrated subligamentous and again the discectomy there is no further stenosis. I did have to take off probably a good 15-20% of the superior aspect of the endplate of C5 in order to adequately decompress the spinal cord. Plus it was very degenerated at the cartilaginous endplate level. So I selected a 7 mm TPS coated peek cage for C3-4 and a 10 lordotic for C4-5. Both cages were packed inserted in one to posterior to anterior vertebral and selected a 36 mm globus plate and all screws excellent purchase locking mechanisms were engaged. At was a cuff see her good fixing space was maintained a J-P drain was placed and the wounds closed in layers with after Vicryl running 4 subcuticular in the skin Dermabond benzo and Steri-Strips and sterile dressing was applied patient recovered in stable condition. At the end of case on it counts sponge counts were correct.

## 2018-10-27 NOTE — Interval H&P Note (Signed)
History and Physical Interval Note: History and physical exam reviewed CT scan was reviewed with the patient and spouse and his sister  All their questions were answered  Risks associated with procedure discussed  Consent for bronchoscopy and endobronchial ultrasound aspiration of lymph node was obtained 10/27/2018 1:24 PM  Alexander Hatfield.  has presented today for surgery, with the diagnosis of Stenosis  The various methods of treatment have been discussed with the patient and family. After consideration of risks, benefits and other options for treatment, the patient has consented to  Procedure(s): C3-4 C4-5 ANTERIOR CERVICAL DECOMPRESSION/DISCECTOMY FUSION 2 LEVELS (N/A) VIDEO BRONCHOSCOPY WITH ENDOBRONCHIAL ULTRASOUND (N/A) as a surgical intervention .  The patient's history has been reviewed, patient examined, no change in status, stable for surgery.  I have reviewed the patient's chart and labs.  Questions were answered to the patient's satisfaction.     Laronica Bhagat A Cyera Balboni

## 2018-10-27 NOTE — Anesthesia Procedure Notes (Signed)
Procedure Name: Intubation Date/Time: 10/27/2018 3:04 PM Performed by: Lance Coon, CRNA Pre-anesthesia Checklist: Patient identified, Emergency Drugs available, Suction available, Patient being monitored and Timeout performed Patient Re-evaluated:Patient Re-evaluated prior to induction Oxygen Delivery Method: Circle system utilized Preoxygenation: Pre-oxygenation with 100% oxygen Induction Type: IV induction Ventilation: Mask ventilation without difficulty Laryngoscope Size: McGraph and 4 Grade View: Grade I Tube type: Oral Tube size: 8.5 mm Number of attempts: 1 Airway Equipment and Method: Stylet Placement Confirmation: ETT inserted through vocal cords under direct vision,  positive ETCO2 and breath sounds checked- equal and bilateral Secured at: 22 cm Tube secured with: Tape Dental Injury: Teeth and Oropharynx as per pre-operative assessment

## 2018-10-27 NOTE — Anesthesia Preprocedure Evaluation (Signed)
Anesthesia Evaluation  Patient identified by MRN, date of birth, ID band Patient awake    Reviewed: Allergy & Precautions, NPO status , Patient's Chart, lab work & pertinent test results  Airway Mallampati: II       Dental   Pulmonary sleep apnea , former smoker,    breath sounds clear to auscultation       Cardiovascular hypertension,  Rhythm:Regular Rate:Normal     Neuro/Psych    GI/Hepatic negative GI ROS, Neg liver ROS,   Endo/Other  negative endocrine ROS  Renal/GU negative Renal ROS     Musculoskeletal  (+) Arthritis ,   Abdominal   Peds  Hematology   Anesthesia Other Findings   Reproductive/Obstetrics                             Anesthesia Physical Anesthesia Plan  ASA: III  Anesthesia Plan: General   Post-op Pain Management:    Induction: Intravenous  PONV Risk Score and Plan: 2  Airway Management Planned: Oral ETT  Additional Equipment:   Intra-op Plan:   Post-operative Plan: Possible Post-op intubation/ventilation  Informed Consent: I have reviewed the patients History and Physical, chart, labs and discussed the procedure including the risks, benefits and alternatives for the proposed anesthesia with the patient or authorized representative who has indicated his/her understanding and acceptance.   Dental advisory given  Plan Discussed with: CRNA and Anesthesiologist  Anesthesia Plan Comments:         Anesthesia Quick Evaluation

## 2018-10-27 NOTE — H&P (Signed)
Alexander Hatfield. is an 62 y.o. male.   Chief Complaint: neck pain arm and leg weakness balance difficulty in walking difficulty HPI: 62 year old gentleman long-standing history of multiple cervical and lumbar surgeries presents with progressive quadriparesis and myelopathy. He's had progressive difficulty walking to where now he has numbness in his legs and is dependent on a wheelchair. Patient was scheduled originally for surgery and even however preoperatively was diagnosed with a lung mass and so therefore was transferred his care to [facility] for evaluation and management and concurrent evaluation of possible lung cancer with cervical surgery. So patient has been recommended anterior cervical discectomies and fusion at C3-4 and C4-5 for severe stenosis and myelopathy along with subsequent bronchoscopy for evaluation of probable lung cancer. We have extensively gone over the risks and benefits of the procedure with him as well as perioperative course expectations of outcome and alternatives of surgery and he understood and agreed to proceed forward.  Past Medical History:  Diagnosis Date  . Arthritis   . Chronic back pain   . Chronic back pain   . Headache   . HOH (hard of hearing)   . Hypertension   . Lung mass   . Pre-diabetes   . Sleep apnea    wears cpap  . Wears glasses   . Wears partial dentures     Past Surgical History:  Procedure Laterality Date  . BACK SURGERY    . MULTIPLE TOOTH EXTRACTIONS    . SPINAL CORD STIMULATOR IMPLANT      Family History  Problem Relation Age of Onset  . Lupus Mother   . Lung disease Father    Social History:  reports that he has quit smoking. His smoking use included cigarettes. He has never used smokeless tobacco. He reports that he does not drink alcohol or use drugs.  Allergies:  Allergies  Allergen Reactions  . Iohexol      Desc: PT STATES THAT WHEN GIVEN THE CONTRAST HIS LEGS BURN AND HE PASSES OUT, COMA FOR 7DAY. DR Thornton Papas WOULD NOT  PRE MEDICATE DUE TO SEVERITY OF REACTION, STUDY WAS CX     Medications Prior to Admission  Medication Sig Dispense Refill  . amLODipine (NORVASC) 10 MG tablet Take 10 mg by mouth daily.     . baclofen (LIORESAL) 20 MG tablet Take 20 mg by mouth 3 (three) times daily as needed for muscle spasms.     . furosemide (LASIX) 20 MG tablet Take 20 mg by mouth daily.     Marland Kitchen gabapentin (NEURONTIN) 300 MG capsule Take 600 mg by mouth 3 (three) times daily.     Marland Kitchen levofloxacin (LEVAQUIN) 500 MG tablet Take 500 mg by mouth daily.     Marland Kitchen losartan (COZAAR) 100 MG tablet Take 100 mg by mouth daily.     . meloxicam (MOBIC) 15 MG tablet Take 15 mg by mouth daily.     Marland Kitchen oxycodone (ROXICODONE) 30 MG immediate release tablet Take 30 mg by mouth every 8 (eight) hours.     . potassium chloride SA (K-DUR,KLOR-CON) 20 MEQ tablet Take 20 mEq by mouth every evening.     . pseudoephedrine (SUDAFED) 120 MG 12 hr tablet Take 120 mg by mouth 2 (two) times daily as needed for congestion.      No results found for this or any previous visit (from the past 48 hour(s)). No results found.  ROS  Blood pressure 127/78, pulse 79, temperature 98.4 F (36.9 C), temperature source Oral,  resp. rate 18, height 6\' 1"  (1.854 m), weight 131.5 kg, SpO2 96 %. Physical Exam   Assessment/Plan  A 62 year old gentleman presents forACDF C3-4 C4-5 followed by bronchoscopy  Alexander Hatfield P, MD 10/27/2018, 12:36 PM

## 2018-10-28 MED ORDER — PNEUMOCOCCAL VAC POLYVALENT 25 MCG/0.5ML IJ INJ: 0.5000 mL | INJECTION | INTRAMUSCULAR | Status: DC

## 2018-10-28 MED ORDER — PANTOPRAZOLE SODIUM 40 MG PO TBEC
40.0000 mg | DELAYED_RELEASE_TABLET | Freq: Every day | ORAL | Status: DC
  Administered 2018-10-28 – 2018-10-29 (×2): 40 mg via ORAL
  Filled 2018-10-28 (×2): qty 1

## 2018-10-28 NOTE — Brief Op Note (Signed)
10/27/2018  EBUS procedure rescheduled

## 2018-10-28 NOTE — Progress Notes (Addendum)
Occupational Therapy Evaluation  PTA, pt living at home with his wife and was overall modified independent with ADL. However, the week prior to admission, self care was becoming more difficult due to weakness and pt states he was becoming more SOB. Pt states he has had multiple falls where his L leg buckles, but that he catches himself, with the exception of falling into his railing of his front steps and breaking the banister. Pt currently requires min A with mobility and Mod A with LB ADL @ RW level. Pt is a high fall risk. Feel pt is an excellent candidate for short term rehab at CIR to reach modified independent level to facilitate safe DC home with wife who has MS. Pt very motivated to return to PLOF. Pt states that he is "dealing the the news that he may have lung cancer". Recommend pastoral consult. Pt would most likely also benefit from a Palliative care Consult as well. Will follow acutely.     10/28/18 1100  OT Visit Information  Last OT Received On 10/28/18  Assistance Needed +1  History of Present Illness 62 year old gym is a progress worsening myelopathy with weakness in his hands and legs difficulty walking workup revealed severe cervical stenosis C3-4 C4-5. Underwent C3-4 C4-5 ANTERIOR CERVICAL DECOMPRESSION/DISCECTOMY FUSION 2 LEVELS .  Preop chest x-ray showed right upper lobe lung mass - plan for lung biopsy 12/18.  Precautions  Precautions Cervical  Precaution Booklet Issued Yes (comment)  Required Braces or Orthoses Cervical Brace  Cervical Brace Soft collar  Home Living  Family/patient expects to be discharged to: Private residence  Living Arrangements Spouse/significant other  Available Help at Discharge Available 24 hours/day  Type of [place] to enter  Entrance Stairs-Number of Steps 3`  Entrance Stairs-Rails Left (being repaired; thinking about having a ramp built)  Home Layout One level  [employer] Walk-in shower (in process of  getting walk in shower)  Corporate treasurer Yes  How Accessible Accessible via walker;Accessible via wheelchair  [place] - single point;Walker - 2 wheels;Hospital bed;Wheelchair - manual (some of equipment belonged to his deceased mother in law)  Prior Function  Level of Independence Independent with assistive device(s)  Comments Started having difficulty with ambulation over the last month. Has started using a cane for the past week. "I get SOB just walking across the room"  Communication  Communication No difficulties  Pain Assessment  Pain Assessment 0-10  Pain Score 4  Pain Location neck  Pain Descriptors / Indicators Aching  Pain Intervention(s) Limited activity within patient's tolerance  Cognition  Arousal/Alertness Awake/alert  Behavior During Therapy WFL for tasks assessed/performed  Overall Cognitive Status Within Functional Limits for tasks assessed  Upper Extremity Assessment  Upper Extremity Assessment Generalized weakness;RUE deficits/detail;LUE deficits/detail (sensory deficits)  RUE Deficits / Details AROM overall WFL. Decreased in-hand manipulation skills but functional. Increaseed difficulty with manipulation when vision is occluded - reaching behand back  RUE Sensation decreased light touch ("feels like they are thawing")  RUE Coordination decreased fine motor  LUE Deficits / Details same as R  LUE Sensation decreased light touch  Lower Extremity Assessment  Lower Extremity Assessment Defer to PT evaluation  Cervical / Trunk Assessment  Cervical / Trunk Assessment Kyphotic;Other exceptions (forward head; hx of multiple back surgeries)  ADL  Overall ADL's  Needs assistance/impaired  Grooming Oral care;Set up;Wash/dry face;Wash/dry hands  Upper Body Bathing Set up;Supervision/ safety;Sitting  Lower Body Bathing Moderate assistance  Upper Body Dressing  Sitting  Lower Body Dressing Moderate assistance;Sit to/from Archivist Minimal assistance;Stand-pivot;RW (from higher height bed)  Toileting- Clothing Manipulation and Hygiene Moderate assistance  Functional mobility during ADLs Minimal assistance;Rolling walker;Cueing for safety;Cueing for sequencing (stand pivot and only a few steps)  General ADL Comments Pt states PTA, he began to get SOB when walking across the room and that ADL tasks were beginning to get more difficult to complete  Vision- History  Baseline Vision/History Wears glasses  Wears Glasses Reading only  Bed Mobility  Overal bed mobility Needs Assistance  General bed mobility comments Pt having to use bed rails and requires mOd A. Does not sleep in a bed at home. Sleeps in the recliner  Transfers  Overall transfer level Needs assistance  Transfers Sit to/from Stand;Stand Pivot Transfers  Sit to Stand Min assist;From elevated surface  Stand pivot transfers Min assist  General transfer comment Appears to have difficulty coordinating movemetn of LLE  Balance  Overall balance assessment History of Falls;Needs assistance  Sitting-balance support Feet supported  Sitting balance-Leahy Scale Fair  Standing balance-Leahy Scale Poor  Standing balance comment reliant on external support  OT - End of Session  Equipment Utilized During Treatment Gait belt;Rolling walker;Cervical collar  Patient left in chair  Nurse Communication Mobility status;Precautions  OT Assessment  OT Recommendation/Assessment Patient needs continued OT Services  OT Visit Diagnosis Other abnormalities of gait and mobility (R26.89);Muscle weakness (generalized) (M62.81);History of falling (Z91.81);Ataxia, unspecified (R27.0);Pain  Pain - part of body  (neck)  OT Problem List Decreased strength;Decreased range of motion;Decreased activity tolerance;Impaired balance (sitting and/or standing);Decreased coordination;Decreased safety awareness;Decreased knowledge of use of DME or AE;Decreased knowledge of  precautions;Cardiopulmonary status limiting activity;Impaired sensation;Obesity;Impaired UE functional use;Pain  Barriers to Discharge Other (comment) (limited assistance from wife  - she has MS)  OT Plan  OT Frequency (ACUTE ONLY) Min 3X/week  OT Treatment/Interventions (ACUTE ONLY) Self-care/ADL training;Therapeutic exercise;Neuromuscular education;Energy conservation;DME and/or AE instruction;Therapeutic activities;Patient/family education;Balance training  AM-PAC OT "6 Clicks" Daily Activity Outcome Measure (Version 2)  Help from another person eating meals? 4  Help from another person taking care of personal grooming? 3  Help from another person toileting, which includes using toliet, bedpan, or urinal? 3  Help from another person bathing (including washing, rinsing, drying)? 2  Help from another person to put on and taking off regular upper body clothing? 3  Help from another person to put on and taking off regular lower body clothing? 2  6 Click Score 17  OT Recommendation  Recommendations for Other Services Other (comment) (Palliative Care consult)  Follow Up Recommendations CIR  OT Equipment 3 in 1 bedside commode  Individuals Consulted  Consulted and Agree with Results and Recommendations Patient  Acute Rehab OT Goals  Patient Stated Goal to get stronger and not fall; be active again  OT Goal Formulation With patient  Time For Goal Achievement 11/11/18  Potential to Achieve Goals Good  OT Time Calculation  OT Start Time (ACUTE ONLY) 1115  OT Stop Time (ACUTE ONLY) 1145  OT Time Calculation (min) 30 min  OT General Charges  $OT Visit 1 Visit  OT Evaluation  $OT Eval Moderate Complexity 1 Mod  OT Treatments  $Self Care/Home Management  8-22 mins  Written Expression  Dominant Hand Left    Maurie Boettcher, OT/L   Acute OT Clinical Specialist [place] Pager [phone] Office [phone]

## 2018-10-28 NOTE — Plan of Care (Signed)
  Problem: General Medical Path Diagnosis Goal: Knowledge of Plan of Care will increase 10/28/2018 0504 by Georjean Mode, RN Outcome: Progressing 10/28/2018 0501 by Georjean Mode, RN Outcome: Progressing   Problem: Infection: Goal: Knowledge of Plan of Care will increase 10/28/2018 0504 by Georjean Mode, RN Outcome: Progressing 10/28/2018 0501 by Georjean Mode, RN Outcome: Progressing

## 2018-10-28 NOTE — Progress Notes (Signed)
Orthopedic Tech Progress Note Patient Details:  Alexander Hatfield 1956-06-06 [id number]  Patient ID: Alexander Costain., male   DOB: 1956-01-04, 62 y.o.   MRN: [id number] RN says pt has soft collar.  Karolee Stamps 10/28/2018, 9:46 AM

## 2018-10-28 NOTE — Progress Notes (Signed)
Rehab Admissions Coordinator Note:  Patient was screened by Cleatrice Burke for appropriateness for an Inpatient Acute Rehab Consult per PT recs.  At this time, we are recommending Inpatient Rehab consult if pt would like to be considered for admit. Please advise.  Danne Baxter, RN, MSN Rehab Admissions Coordinator [phone] 10/28/2018 1:57 PM

## 2018-10-28 NOTE — Evaluation (Signed)
Physical Therapy Evaluation Patient Details Name: Alexander Hatfield. MRN: [id number] DOB: 13-Aug-1956 Today's Date: 10/28/2018   History of Present Illness  62 year old gym is a progress worsening myelopathy with weakness in his hands and legs difficulty walking workup revealed severe cervical stenosis C3-4 C4-5. Underwent C3-4 C4-5 ANTERIOR CERVICAL DECOMPRESSION/DISCECTOMY FUSION 2 LEVELS .  Preop chest x-ray showed right upper lobe lung mass - plan for lung biopsy 12/18.  Clinical Impression  Pt admitted with/for elective cervical fusion.  Pt needs min to moderate assist at this time, but I expect he could make good gains with consistent therapy and could handle the intensity of CIR.Marland Kitchen  Pt currently limited functionally due to the problems listed. ( See problems list.)   Pt will benefit from PT to maximize function and safety in order to get ready for next venue listed below.     Follow Up Recommendations CIR;Supervision/Assistance - 24 hour    Equipment Recommendations  Other (comment)(TBA)    Recommendations for Other Services Rehab consult     Precautions / Restrictions Precautions Precautions: Cervical Precaution Booklet Issued: Yes (comment) Required Braces or Orthoses: Cervical Brace Cervical Brace: Soft collar      Mobility  Bed Mobility Overal bed mobility: Needs Assistance             General bed mobility comments: Pt having to use bed rails and requires mOd A. Does not sleep in a bed at home. Sleeps in the recliner  Transfers Overall transfer level: Needs assistance   Transfers: Sit to/from Stand;Stand Pivot Transfers Sit to Stand: Min assist;From elevated surface Stand pivot transfers: Min assist       General transfer comment: Appears to have difficulty coordinating movemetn of LLE  Ambulation/Gait Ambulation/Gait assistance: Min assist;Mod assist(mod as he fatigued) Gait Distance (Feet): 90 Feet(then additional 80 feet and 90 feet with standing  rests) Assistive device: Rolling walker (2 wheeled) Gait Pattern/deviations: Step-through pattern   Gait velocity interpretation: <1.8 ft/sec, indicate of risk for recurrent falls General Gait Details: pt was mildly unsteady overall with mildly uncoordinated L LE swing through, weak knee extention in stance with pt having tendency to flex head and trunk forward consistently unless cued repetitively to stay straight.  With upright posture, pt had to work harder to control the L knee as expected.  Stairs            Wheelchair Mobility    Modified Rankin (Stroke Patients Only)       Balance Overall balance assessment: History of Falls;Needs assistance Sitting-balance support: Feet supported Sitting balance-Leahy Scale: Fair       Standing balance-Leahy Scale: Poor Standing balance comment: reliant on external support                             Pertinent Vitals/Pain Pain Assessment: 0-10 Pain Score: 4  Pain Location: neck Pain Descriptors / Indicators: Aching Pain Intervention(s): Limited activity within patient's tolerance    Home Living Family/patient expects to be discharged to:: Private residence Living Arrangements: Spouse/significant other Available Help at Discharge: Available 24 hours/day Type of Home: House Home Access: Stairs to enter Entrance Stairs-Rails: Left(being repaired; thinking about having a ramp built) Technical brewer of Steps: 3` Home Layout: One level Home Equipment: Cane - single point;Walker - 2 wheels;Hospital bed;Wheelchair - manual(some of equipment belonged to his deceased mother in [employer])      Prior Function Level of Independence: Independent with assistive device(s)  Comments: Started having difficulty with ambulation over the last month. Has started using a cane for the past week. "I get SOB just walking across the room"     Hand Dominance   Dominant Hand: Left    Extremity/Trunk Assessment   Upper  Extremity Assessment Upper Extremity Assessment: Generalized weakness;RUE deficits/detail;LUE deficits/detail(sensory deficits) RUE Deficits / Details: AROM overall WFL. Decreased in-hand manipulation skills but functional. Increaseed difficulty with manipulation when vision is occluded - reaching behand back RUE Sensation: ("feels like they are thawing") RUE Coordination: decreased fine motor LUE Deficits / Details: same as R LUE Sensation: decreased light touch    Lower Extremity Assessment Lower Extremity Assessment: RLE deficits/detail;LLE deficits/detail RLE Deficits / Details: functional strength LLE Deficits / Details: generally weak at 3+/5.  quads weak, but functional with no buckling on eval.    Cervical / Trunk Assessment Cervical / Trunk Assessment: Kyphotic;Other exceptions(forward head; hx of multiple back surgeries)  Communication   Communication: No difficulties  Cognition Arousal/Alertness: Awake/alert Behavior During Therapy: WFL for tasks assessed/performed Overall Cognitive Status: Within Functional Limits for tasks assessed                                        General Comments General comments (skin integrity, edema, etc.): Reinforced all back care/prec, log roll, lifting restrictions and progression of activity.    Exercises     Assessment/Plan    PT Assessment Patient needs continued PT services  PT Problem List Decreased strength;Decreased activity tolerance;Decreased mobility;Decreased coordination;Decreased knowledge of use of DME;Decreased knowledge of precautions;Pain;Impaired sensation       PT Treatment Interventions Gait training;Functional mobility training;DME instruction;Therapeutic activities;Patient/family education    PT Goals (Current goals can be found in the Care Plan section)  Acute Rehab PT Goals Patient Stated Goal: to get stronger and not fall; be active again PT Goal Formulation: With patient Time For Goal  Achievement: 11/11/18 Potential to Achieve Goals: Good    Frequency Min 5X/week   Barriers to discharge        Co-evaluation               AM-PAC PT "6 Clicks" Mobility  Outcome Measure Help needed turning from your back to your side while in a flat bed without using bedrails?: A Lot Help needed moving from lying on your back to sitting on the side of a flat bed without using bedrails?: A Lot Help needed moving to and from a bed to a chair (including a wheelchair)?: A Little Help needed standing up from a chair using your arms (e.g., wheelchair or bedside chair)?: A Little Help needed to walk in hospital room?: A Lot Help needed climbing 3-5 steps with a railing? : A Lot 6 Click Score: 14    End of Session   Activity Tolerance: Patient tolerated treatment well(notable trunk and LE fatigue) Patient left: in chair;with call bell/phone within reach Nurse Communication: Mobility status PT Visit Diagnosis: Unsteadiness on feet (R26.81);Other abnormalities of gait and mobility (R26.89);Muscle weakness (generalized) (M62.81);History of falling (Z91.81);Pain Pain - part of body: (neck)    Time: 4098-1191 PT Time Calculation (min) (ACUTE ONLY): 35 min   Charges:   PT Evaluation $PT Eval Moderate Complexity: 1 Mod PT Treatments $Gait Training: 8-22 mins        10/28/2018  Donnella Sham, PT Acute Rehabilitation Services [phone]  (pager) [phone]  (office)  Tessie Fass Gissella Niblack  10/28/2018, 1:41 PM

## 2018-10-28 NOTE — Plan of Care (Signed)
  Problem: General Medical Path Diagnosis Goal: Knowledge of Plan of Care will increase Outcome: Progressing   Problem: Infection: Goal: Knowledge of Plan of Care will increase Outcome: Progressing

## 2018-10-28 NOTE — Op Note (Signed)
EBUS procedure was rescheduled secondary to machinery malfunction

## 2018-10-28 NOTE — Progress Notes (Signed)
Patient ID: Alexander Zeek., male   DOB: Mar 16, 1956, 62 y.o.   MRN: [id number] Patient doing much better this morning pain well-controlled improved numbness in fingers and legs  Incision clean dry and intact  Physical occupational therapy today bronchoscopy tomorrow morning

## 2018-10-29 ENCOUNTER — Inpatient Hospital Stay (HOSPITAL_COMMUNITY): Payer: Medicare Other | Admitting: Certified Registered"

## 2018-10-29 ENCOUNTER — Ambulatory Visit (HOSPITAL_COMMUNITY): Payer: Medicare Other

## 2018-10-29 ENCOUNTER — Inpatient Hospital Stay (HOSPITAL_COMMUNITY): Payer: Medicare Other

## 2018-10-29 ENCOUNTER — Encounter (HOSPITAL_COMMUNITY)
Admission: RE | Disposition: A | Discharge status: Home Health | Payer: Self-pay | Source: Home / Self Care | Attending: Neurosurgery

## 2018-10-29 ENCOUNTER — Encounter (HOSPITAL_COMMUNITY): Payer: Self-pay | Admitting: Pulmonary Disease

## 2018-10-29 DIAGNOSIS — R918 Other nonspecific abnormal finding of lung field: Secondary | ICD-10-CM

## 2018-10-29 HISTORY — PX: VIDEO BRONCHOSCOPY WITH ENDOBRONCHIAL ULTRASOUND: SHX6177

## 2018-10-29 HISTORY — PX: VIDEO BRONCHOSCOPY: SHX5072

## 2018-10-29 SURGERY — BRONCHOSCOPY, VIDEO-ASSISTED
Anesthesia: General

## 2018-10-29 MED ORDER — PROPOFOL 10 MG/ML IV BOLUS
INTRAVENOUS | Status: AC
  Filled 2018-10-29: qty 20

## 2018-10-29 MED ORDER — MIDAZOLAM HCL 5 MG/5ML IJ SOLN
INTRAMUSCULAR | Status: DC | PRN
  Administered 2018-10-29: 2 mg via INTRAVENOUS

## 2018-10-29 MED ORDER — 0.9 % SODIUM CHLORIDE (POUR BTL) OPTIME
TOPICAL | Status: DC | PRN
  Administered 2018-10-29: 1000 mL

## 2018-10-29 MED ORDER — FENTANYL CITRATE (PF) 250 MCG/5ML IJ SOLN
INTRAMUSCULAR | Status: AC
  Filled 2018-10-29: qty 5

## 2018-10-29 MED ORDER — LIDOCAINE 2% (20 MG/ML) 5 ML SYRINGE
INTRAMUSCULAR | Status: DC | PRN
  Administered 2018-10-29: 80 mg via INTRAVENOUS

## 2018-10-29 MED ORDER — OXYCODONE HCL 5 MG PO TABS: 5.0000 mg | ORAL_TABLET | Freq: Once | ORAL | Status: DC | PRN

## 2018-10-29 MED ORDER — EPINEPHRINE PF 1 MG/ML IJ SOLN
INTRAMUSCULAR | Status: AC
  Filled 2018-10-29: qty 1

## 2018-10-29 MED ORDER — MIDAZOLAM HCL 2 MG/2ML IJ SOLN
INTRAMUSCULAR | Status: AC
  Filled 2018-10-29: qty 2

## 2018-10-29 MED ORDER — FENTANYL CITRATE (PF) 100 MCG/2ML IJ SOLN
25.0000 ug | INTRAMUSCULAR | Status: DC | PRN
  Administered 2018-10-29 (×2): 50 ug via INTRAVENOUS

## 2018-10-29 MED ORDER — FENTANYL CITRATE (PF) 100 MCG/2ML IJ SOLN
INTRAMUSCULAR | Status: AC
  Filled 2018-10-29: qty 2

## 2018-10-29 MED ORDER — LACTATED RINGERS IV SOLN
INTRAVENOUS | Status: DC | PRN
  Administered 2018-10-29: 11:00:00 via INTRAVENOUS

## 2018-10-29 MED ORDER — PROPOFOL 10 MG/ML IV BOLUS
INTRAVENOUS | Status: DC | PRN
  Administered 2018-10-29: 170 mg via INTRAVENOUS

## 2018-10-29 MED ORDER — ROCURONIUM BROMIDE 50 MG/5ML IV SOSY
PREFILLED_SYRINGE | INTRAVENOUS | Status: DC | PRN
  Administered 2018-10-29: 50 mg via INTRAVENOUS

## 2018-10-29 MED ORDER — OXYCODONE HCL 5 MG PO TABS
ORAL_TABLET | ORAL | Status: AC
  Filled 2018-10-29: qty 2

## 2018-10-29 MED ORDER — SUGAMMADEX SODIUM 500 MG/5ML IV SOLN
INTRAVENOUS | Status: DC | PRN
  Administered 2018-10-29: 280 mg via INTRAVENOUS

## 2018-10-29 MED ORDER — ACETAMINOPHEN 500 MG PO TABS: 1000.0000 mg | ORAL_TABLET | Freq: Once | ORAL | Status: DC | PRN

## 2018-10-29 MED ORDER — DEXMEDETOMIDINE HCL IN NACL 200 MCG/50ML IV SOLN
INTRAVENOUS | Status: DC | PRN
  Administered 2018-10-29 (×2): 4 ug via INTRAVENOUS
  Administered 2018-10-29: 8 ug via INTRAVENOUS
  Administered 2018-10-29 (×2): 4 ug via INTRAVENOUS
  Administered 2018-10-29: 8 ug via INTRAVENOUS

## 2018-10-29 MED ORDER — FENTANYL CITRATE (PF) 100 MCG/2ML IJ SOLN
INTRAMUSCULAR | Status: DC | PRN
  Administered 2018-10-29 (×2): 100 ug via INTRAVENOUS
  Administered 2018-10-29: 50 ug via INTRAVENOUS

## 2018-10-29 MED ORDER — DEXAMETHASONE SODIUM PHOSPHATE 10 MG/ML IJ SOLN
INTRAMUSCULAR | Status: DC | PRN
  Administered 2018-10-29: 10 mg via INTRAVENOUS

## 2018-10-29 MED ORDER — SUCCINYLCHOLINE CHLORIDE 20 MG/ML IJ SOLN
INTRAMUSCULAR | Status: DC | PRN
  Administered 2018-10-29: 120 mg via INTRAVENOUS

## 2018-10-29 MED ORDER — ACETAMINOPHEN 160 MG/5ML PO SOLN: 1000.0000 mg | Freq: Once | ORAL | Status: DC | PRN

## 2018-10-29 MED ORDER — ONDANSETRON HCL 4 MG/2ML IJ SOLN
INTRAMUSCULAR | Status: DC | PRN
  Administered 2018-10-29: 4 mg via INTRAVENOUS

## 2018-10-29 MED ORDER — OXYCODONE HCL 5 MG/5ML PO SOLN: 5.0000 mg | Freq: Once | ORAL | Status: DC | PRN

## 2018-10-29 MED ORDER — ACETAMINOPHEN 10 MG/ML IV SOLN
1000.0000 mg | Freq: Once | INTRAVENOUS | Status: DC | PRN
  Filled 2018-10-29: qty 100

## 2018-10-29 MED ORDER — SUCCINYLCHOLINE CHLORIDE 200 MG/10ML IV SOSY
PREFILLED_SYRINGE | INTRAVENOUS | Status: AC
  Filled 2018-10-29: qty 10

## 2018-10-29 MED ORDER — HYDROMORPHONE HCL 1 MG/ML IJ SOLN
INTRAMUSCULAR | Status: AC
  Administered 2018-10-29: 0.5 mg via INTRAVENOUS
  Filled 2018-10-29: qty 1

## 2018-10-29 SURGICAL SUPPLY — 43 items
ADAPTER VALVE BIOPSY EBUS (MISCELLANEOUS) IMPLANT
ADPTR VALVE BIOPSY EBUS (MISCELLANEOUS)
BRUSH CYTOL CELLEBRITY 1.5X140 (MISCELLANEOUS) ×2 IMPLANT
CANISTER SUCT 3000ML PPV (MISCELLANEOUS) ×3 IMPLANT
CONT SPEC 4OZ CLIKSEAL STRL BL (MISCELLANEOUS) ×6 IMPLANT
COVER BACK TABLE 60X90IN (DRAPES) ×3 IMPLANT
COVER WAND RF STERILE (DRAPES) ×3 IMPLANT
FILTER STRAW FLUID ASPIR (MISCELLANEOUS) IMPLANT
FORCEPS BIOP RJ4 1.8 (CUTTING FORCEPS) ×2 IMPLANT
FORCEPS RADIAL JAW LRG 4 PULM (INSTRUMENTS) IMPLANT
GAUZE SPONGE 4X4 12PLY STRL (GAUZE/BANDAGES/DRESSINGS) ×3 IMPLANT
GLOVE BIO SURGEON STRL SZ8 (GLOVE) ×3 IMPLANT
GOWN STRL REUS W/ TWL LRG LVL3 (GOWN DISPOSABLE) ×1 IMPLANT
GOWN STRL REUS W/ TWL XL LVL3 (GOWN DISPOSABLE) ×1 IMPLANT
GOWN STRL REUS W/TWL LRG LVL3 (GOWN DISPOSABLE) ×2
GOWN STRL REUS W/TWL XL LVL3 (GOWN DISPOSABLE) ×2
KIT CLEAN ENDO COMPLIANCE (KITS) ×6 IMPLANT
KIT TURNOVER KIT B (KITS) ×3 IMPLANT
MARKER SKIN DUAL TIP RULER LAB (MISCELLANEOUS) ×3 IMPLANT
NDL ASPIRATION VIZISHOT 19G (NEEDLE) IMPLANT
NDL ASPIRATION VIZISHOT 21G (NEEDLE) ×1 IMPLANT
NEEDLE ASPIRATION VIZISHOT 19G (NEEDLE) IMPLANT
NEEDLE ASPIRATION VIZISHOT 21G (NEEDLE) IMPLANT
NS IRRIG 1000ML POUR BTL (IV SOLUTION) ×5 IMPLANT
OIL SILICONE PENTAX (PARTS (SERVICE/REPAIRS)) ×3 IMPLANT
PAD ARMBOARD 7.5X6 YLW CONV (MISCELLANEOUS) ×6 IMPLANT
RADIAL JAW LRG 4 PULMONARY (INSTRUMENTS)
SYR 20CC LL (SYRINGE) ×6 IMPLANT
SYR 20ML ECCENTRIC (SYRINGE) ×6 IMPLANT
SYR 30ML LL (SYRINGE) ×3 IMPLANT
SYR 50ML SLIP (SYRINGE) ×2 IMPLANT
SYR 5ML LL (SYRINGE) ×3 IMPLANT
SYR 5ML LUER SLIP (SYRINGE) ×3 IMPLANT
TOWEL OR 17X24 6PK STRL BLUE (TOWEL DISPOSABLE) ×3 IMPLANT
TRAP SPECIMEN MUCOUS 40CC (MISCELLANEOUS) ×3 IMPLANT
TUBE CONNECTING 20'X1/4 (TUBING) ×2
TUBE CONNECTING 20X1/4 (TUBING) ×4 IMPLANT
UNDERPAD 30X30 (UNDERPADS AND DIAPERS) ×3 IMPLANT
VALVE BIOPSY  SINGLE USE (MISCELLANEOUS) ×2
VALVE BIOPSY SINGLE USE (MISCELLANEOUS) ×1 IMPLANT
VALVE DISPOSABLE (MISCELLANEOUS) ×3 IMPLANT
VALVE SUCTION BRONCHIO DISP (MISCELLANEOUS) ×3 IMPLANT
WATER STERILE IRR 1000ML POUR (IV SOLUTION) ×3 IMPLANT

## 2018-10-29 NOTE — Progress Notes (Signed)
PT Cancellation Note  Patient Details Name: Alexander Hatfield. MRN: [id number] DOB: [birth year]   Cancelled Treatment:    Reason Eval/Treat Not Completed: Patient at procedure or test/unavailable.  Pt has been in surgery for biopsy for most of the day.  Will hold therapy for today and see pt as able 12/19.   10/29/2018  Donnella Sham, [place] [phone]  (pager) [phone]  (office)   [name] 10/29/2018, 1:40 PM

## 2018-10-29 NOTE — Plan of Care (Signed)
  Problem: General Medical Path Diagnosis Goal: Knowledge of Plan of Care will increase Outcome: Progressing   Problem: Infection: Goal: Knowledge of Plan of Care will increase Outcome: Progressing

## 2018-10-29 NOTE — Interval H&P Note (Signed)
History and Physical Interval Note: Patient seen at bedside this morning, spouse was present Denies any significant symptoms at present-scared about the procedure Procedure was described  Consents to going ahead with procedure  He is comfortable post his recent neck surgery  We will be having bronchoscopy with endobronchial ultrasound.  10/29/2018 8:04 AM  [name].  has presented today for surgery, with the diagnosis of Spiculated right upper lobe lung mass and enlarged right paratracheal lymph node   The various methods of treatment have been discussed with the patient and family. After consideration of risks, benefits and other options for treatment, the patient has consented to  Procedure(s): VIDEO BRONCHOSCOPY (N/A) VIDEO BRONCHOSCOPY WITH ENDOBRONCHIAL ULTRASOUND (N/A) as a surgical intervention .  The patient's history has been reviewed, patient examined, no change in status, stable for surgery.  I have reviewed the patient's chart and labs.  Questions were answered to the patient's satisfaction.     Adewale A Olalere

## 2018-10-29 NOTE — Anesthesia Preprocedure Evaluation (Addendum)
Anesthesia Evaluation  Patient identified by MRN, date of birth, ID band Patient awake    Reviewed: Allergy & Precautions, NPO status , Patient's Chart, lab work & pertinent test results  History of Anesthesia Complications Negative for: history of anesthetic complications  Airway Mallampati: IV  TM Distance: >3 FB Neck ROM: Limited    Dental  (+) Dental Advisory Given   Pulmonary sleep apnea , former smoker,  Spiculated right upper lobe lung mass and enlarged right  paratracheal lymph node    breath sounds clear to auscultation       Cardiovascular hypertension, Pt. on medications (-) angina(-) Past MI  Rhythm:Regular     Neuro/Psych  Headaches,  Neuromuscular disease    GI/Hepatic negative GI ROS, Neg liver ROS,   Endo/Other    Renal/GU negative Renal ROS     Musculoskeletal  (+) Arthritis ,   Abdominal   Peds  Hematology   Anesthesia Other Findings   Reproductive/Obstetrics                            Anesthesia Physical Anesthesia Plan  ASA: III  Anesthesia Plan: General   Post-op Pain Management:    Induction: Intravenous  PONV Risk Score and Plan: 2 and Ondansetron and Dexamethasone  Airway Management Planned: Oral ETT  Additional Equipment: None  Intra-op Plan:   Post-operative Plan: Extubation in OR  Informed Consent: I have reviewed the patients History and Physical, chart, labs and discussed the procedure including the risks, benefits and alternatives for the proposed anesthesia with the patient or authorized representative who has indicated his/her understanding and acceptance.   Dental advisory given  Plan Discussed with: CRNA and Surgeon  Anesthesia Plan Comments:         Anesthesia Quick Evaluation

## 2018-10-29 NOTE — Progress Notes (Signed)
Pt. NPO for video  bronchoscopy and possible Bios py of the lung.  Pre op check list done will be picked up  In 1 hour time. Pt made aware

## 2018-10-29 NOTE — Progress Notes (Signed)
Inpatient Rehabilitation Admissions Coordinator  Therapy recommendations are now for Home Health. I will sign off.  Danne Baxter, RN, MSN Rehab Admissions Coordinator [phone] 10/29/2018 7:59 PM

## 2018-10-29 NOTE — Progress Notes (Signed)
Patient ID: Alexander Hatfield., male   DOB: 1956/04/15, 62 y.o.   MRN: [id number] Patient a much better today significant improvement in upper and lower extremity weakness numbness and tingling.  4-4+ out of 5 upper and lower extremities incision clean dry and intact  Continue to mobilize with physical and occupational therapy I would anticipate patient will be stable enough for home PT and home OT by tomorrow afternoon or Friday.

## 2018-10-29 NOTE — Anesthesia Procedure Notes (Signed)
Procedure Name: Intubation Date/Time: 10/29/2018 11:19 AM Performed by: Lance Coon, CRNA Pre-anesthesia Checklist: Patient identified, Emergency Drugs available, Suction available, Patient being monitored and Timeout performed Patient Re-evaluated:Patient Re-evaluated prior to induction Oxygen Delivery Method: Circle system utilized Preoxygenation: Pre-oxygenation with 100% oxygen Induction Type: IV induction Ventilation: Mask ventilation without difficulty Laryngoscope Size: McGraph and 4 Grade View: Grade I Tube type: Oral Tube size: 8.5 mm Number of attempts: 1 Airway Equipment and Method: Stylet and Video-laryngoscopy Placement Confirmation: ETT inserted through vocal cords under direct vision,  positive ETCO2 and breath sounds checked- equal and bilateral Secured at: 22 cm Tube secured with: Tape Dental Injury: Teeth and Oropharynx as per pre-operative assessment

## 2018-10-29 NOTE — Transfer of Care (Signed)
Immediate Anesthesia Transfer of Care Note  Patient: Alexander Hatfield.  Procedure(s) Performed: VIDEO BRONCHOSCOPY (N/A ) VIDEO BRONCHOSCOPY WITH ENDOBRONCHIAL ULTRASOUND (N/A )  Patient Location: PACU  Anesthesia Type:General  Level of Consciousness: awake and patient cooperative  Airway & Oxygen Therapy: Patient Spontanous Breathing and Patient connected to nasal cannula oxygen  Post-op Assessment: Report given to RN and Post -op Vital signs reviewed and stable  Post vital signs: Reviewed and stable  Last Vitals:  Vitals Value Taken Time  BP 108/65 10/29/2018 12:20 PM  Temp    Pulse 93 10/29/2018 12:20 PM  Resp 15 10/29/2018 12:20 PM  SpO2 94 % 10/29/2018 12:20 PM  Vitals shown include unvalidated device data.  Last Pain:  Vitals:   10/29/18 0742  TempSrc: Oral  PainSc:       Patients Stated Pain Goal: 1 (32/91/91 6606)  Complications: No apparent anesthesia complications

## 2018-10-29 NOTE — Progress Notes (Signed)
Patient called out and stated he wanted something for pain in his neck. Anesthesia notified and ordered to give Dilaudid as previously ordered on floor. Orders received and carried out.

## 2018-10-29 NOTE — Op Note (Signed)
Name:  [name]. MRN:  [id number] DOB:  January 28, 1956  PROCEDURE NOTE  Procedure(s): Flexible bronchoscopy (97282) Brushing (06015) of the right upper lobe mass Bronchial alveolar lavage (61537) of the right upper lobe Transbronchial lung biopsy, single lobe (94327) of the right upper lobe mass Endobronchial ultrasound (61470) Transbronchial needle aspiration (92957) of the 4R   Indications:  Hilar / mediastinal lymphadenopathy.  Consent:  Procedure, benefits, risks and alternatives discussed.  Questions answered.  Consent obtained.  Anesthesia:  General endotracheal.  Procedure summary:  Appropriate equipment was assembled.  The patient was brought to the operating room and identified as Alexander Hatfield..  Safety timeout was performed. The patient was placed supine on the operating table, airway established and general anesthesia administered by Anesthesia team.   After the appropriate level of anesthesia was assured, flexible video bronchoscope was lubricated and inserted through the endotracheal tube.   Airway examination was performed bilaterally to subsegmental level.  Minimal clear secretions were noted, mucosa appeared normal and no endobronchial lesions were identified.  Bronchial alveolar lavage of the right upper lobe was performed with 80 mL of normal saline and return of 45 mL of fluid, after which the bronchoscope was withdrawn.  Endobronchial ultrasound video bronchoscope was then lubricated and inserted through the endotracheal tube. Surveillance of the mediastinal and and bilateral hilar lymph node stations was performed.  Pathologically enlarged lymph nodes were noted.  Endobronchial ultrasound guided transbronchial needle aspiration of 4R (passes 6),  was performed, after which EBUS bronchoscope was withdrawn.  Flexible video bronchoscope was used again to perform right upper lobe mass brushings and biopsies.  After hemostasis was assure, the bronchoscope was  withdrawn.  The patient was extubated in operating room and transferred to PACU. Post-procedure chest x-ray was ordered.  Specimens sent: Bronchial alveolar lavage specimen of the right upper lobe for cytology.  Complications:  No immediate complications were noted.  Hemodynamic parameters and oxygenation remained stable throughout the procedure.  Estimated blood loss:  Less then 20 mL.  Ander Slade, M.D. Pulmonary and [place] Cell: [phone]  10/29/2018, 12:11 PM

## 2018-10-29 NOTE — Progress Notes (Signed)
Physical Therapy Treatment Patient Details Name: Alexander Hatfield. MRN: [id number] DOB: 09-Oct-1956 Today's Date: 10/29/2018    History of Present Illness 61 year old with worsening myelopathy with weakness in his hands and legs difficulty walking workup revealed severe cervical stenosis C3-4 C4-5. Underwent C3-4 C4-5 ANTERIOR CERVICAL DECOMPRESSION/DISCECTOMY FUSION 2 LEVELS .  Preop chest x-ray showed right upper lobe lung mass - plan for lung biopsy 12/18.    PT Comments    Much improved coordination and stability.  Still needing RW as assistive device.   Follow Up Recommendations  Home health PT;Other (comment)(progress to OPPT for more intense therapy)     Equipment Recommendations  None recommended by PT    Recommendations for Other Services       Precautions / Restrictions Precautions Precautions: Cervical Precaution Booklet Issued: Yes (comment) Required Braces or Orthoses: Cervical Brace Cervical Brace: Soft collar Restrictions Weight Bearing Restrictions: No    Mobility  Bed Mobility               General bed mobility comments: OOB on arrival  Transfers Overall transfer level: Needs assistance   Transfers: Sit to/from Stand;Stand Pivot Transfers Sit to Stand: Supervision Stand pivot transfers: Supervision          Ambulation/Gait Ambulation/Gait assistance: Min guard Gait Distance (Feet): 250 Feet(plus 10 feet x2 without AD and very close guard) Assistive device: Rolling walker (2 wheeled);None Gait Pattern/deviations: Step-through pattern Gait velocity: moderate Gait velocity interpretation: 1.31 - 2.62 ft/sec, indicative of limited community ambulator General Gait Details: mildly unsteady gait overall, but improved heel/toe pattern.  Better able to stand upright,, still needing postural cues.   Stairs Stairs: Yes Stairs assistance: Min guard;Min assist Stair Management: One rail Right;Step to pattern;Forwards Number of Stairs: 10 General  stair comments: ascending with rail easier than descending.   Wheelchair Mobility    Modified Rankin (Stroke Patients Only)       Balance Overall balance assessment: History of Falls;Needs assistance Sitting-balance support: Feet supported Sitting balance-Leahy Scale: Good       Standing balance-Leahy Scale: Fair Standing balance comment: still mostly reliant on AD or external support                            Cognition Arousal/Alertness: Awake/alert Behavior During Therapy: WFL for tasks assessed/performed Overall Cognitive Status: Within Functional Limits for tasks assessed                                        Exercises      General Comments General comments (skin integrity, edema, etc.): Reinforced all back care/precautions with stress on progression of activity.      Pertinent Vitals/Pain Pain Assessment: Faces Faces Pain Scale: Hurts a little bit Pain Location: neck Pain Descriptors / Indicators: Operative site guarding;Sore    Home Living                      Prior Function            PT Goals (current goals can now be found in the care plan section) Acute Rehab PT Goals Patient Stated Goal: to get stronger and not fall; be active again PT Goal Formulation: With patient Time For Goal Achievement: 11/11/18 Potential to Achieve Goals: Good Progress towards PT goals: Progressing toward goals    Frequency  Min 5X/week      PT Plan Discharge plan needs to be updated    Co-evaluation              AM-PAC PT "6 Clicks" Mobility   Outcome Measure  Help needed turning from your back to your side while in a flat bed without using bedrails?: A Little Help needed moving from lying on your back to sitting on the side of a flat bed without using bedrails?: A Little Help needed moving to and from a bed to a chair (including a wheelchair)?: A Little Help needed standing up from a chair using your arms (e.g.,  wheelchair or bedside chair)?: A Little Help needed to walk in hospital room?: A Little Help needed climbing 3-5 steps with a railing? : A Little 6 Click Score: 18    End of Session   Activity Tolerance: Patient tolerated treatment well Patient left: in chair;with call bell/phone within reach Nurse Communication: Mobility status PT Visit Diagnosis: Unsteadiness on feet (R26.81);Other abnormalities of gait and mobility (R26.89);Muscle weakness (generalized) (M62.81);History of falling (Z91.81);Pain Pain - part of body: (neck)     Time: 9292-4462 PT Time Calculation (min) (ACUTE ONLY): 14 min  Charges:  $Gait Training: 8-22 mins                     10/29/2018  Donnella Sham, PT Acute Rehabilitation Services [phone]  (pager) [phone]  (office)   Alexander Hatfield 10/29/2018, 7:13 PM

## 2018-10-30 ENCOUNTER — Encounter (HOSPITAL_COMMUNITY): Payer: Self-pay | Admitting: Pulmonary Disease

## 2018-10-30 ENCOUNTER — Telehealth: Payer: Self-pay | Admitting: Pulmonary Disease

## 2018-10-30 MED ORDER — CYCLOBENZAPRINE HCL 10 MG PO TABS: 10.0000 mg | ORAL_TABLET | Freq: Three times a day (TID) | ORAL | 0 refills | Status: DC | PRN

## 2018-10-30 MED ORDER — SENNOSIDES-DOCUSATE SODIUM 8.6-50 MG PO TABS
1.0000 | ORAL_TABLET | Freq: Two times a day (BID) | ORAL | Status: DC
  Administered 2018-10-30 (×2): 1 via ORAL
  Filled 2018-10-30 (×2): qty 1

## 2018-10-30 MED ORDER — OXYCODONE HCL 5 MG PO TABS: 5.0000 mg | ORAL_TABLET | Freq: Four times a day (QID) | ORAL | 0 refills | Status: DC | PRN

## 2018-10-30 NOTE — Progress Notes (Signed)
Physical Therapy Treatment Patient Details Name: Alexander Hatfield. MRN: [id number] DOB: [birth year] Today's Date: 10/30/2018    History of Present Illness 62 year old with worsening myelopathy with weakness in his hands and legs difficulty walking workup revealed severe cervical stenosis C3-4 C4-5. Underwent C3-4 C4-5 ANTERIOR CERVICAL DECOMPRESSION/DISCECTOMY FUSION 2 LEVELS .  Preop chest x-ray showed right upper lobe lung mass - lung biopsy completed 12/18.    PT Comments    Pt has improved well.  Still needs HHPT progressing to OPPT to get back to more complex activities. Emphasis on education today.   Follow Up Recommendations  Home health PT;Other (comment)     Equipment Recommendations  None recommended by PT    Recommendations for Other Services       Precautions / Restrictions Precautions Precautions: Cervical Precaution Booklet Issued: Yes (comment) Required Braces or Orthoses: Cervical Brace Cervical Brace: Soft collar Restrictions Weight Bearing Restrictions: No    Mobility  Bed Mobility Overal bed mobility: Modified Independent             General bed mobility comments: adequate technique, reinforced better technique  Transfers Overall transfer level: Needs assistance Equipment used: None;Rolling walker (2 wheeled) Transfers: Sit to/from Stand Sit to Stand: Supervision         General transfer comment: uses good transfer technique  Ambulation/Gait Ambulation/Gait assistance: Supervision Gait Distance (Feet): 250 Feet Assistive device: Rolling walker (2 wheeled);None Gait Pattern/deviations: Step-through pattern Gait velocity: moderate Gait velocity interpretation: <1.8 ft/sec, indicate of risk for recurrent falls General Gait Details: improving gait steadiness.  Pt is able to take unsteady steps without the AD, but needs the RW for optimal safety at this point.   Stairs             Wheelchair Mobility    Modified Rankin (Stroke  Patients Only)       Balance Overall balance assessment: Needs assistance   Sitting balance-Leahy Scale: Good       Standing balance-Leahy Scale: Fair                              Cognition Arousal/Alertness: Awake/alert Behavior During Therapy: WFL for tasks assessed/performed Overall Cognitive Status: Within Functional Limits for tasks assessed                                        Exercises      General Comments General comments (skin integrity, edema, etc.): reinforced all back education, but emphasized progression of activity post d/c      Pertinent Vitals/Pain Pain Assessment: Faces Faces Pain Scale: Hurts a little bit Pain Location: neck, back Pain Descriptors / Indicators: Operative site guarding;Sore Pain Intervention(s): Monitored during session    Home Living                      Prior Function            PT Goals (current goals can now be found in the care plan section) Acute Rehab PT Goals Patient Stated Goal: "to go home and get back to normal" PT Goal Formulation: With patient Time For Goal Achievement: 11/11/18 Potential to Achieve Goals: Good Progress towards PT goals: Progressing toward goals    Frequency    Min 5X/week      PT Plan Discharge plan needs to be updated  Co-evaluation              AM-PAC PT "6 Clicks" Mobility   Outcome Measure  Help needed turning from your back to your side while in a flat bed without using bedrails?: None Help needed moving from lying on your back to sitting on the side of a flat bed without using bedrails?: None Help needed moving to and from a bed to a chair (including a wheelchair)?: None Help needed standing up from a chair using your arms (e.g., wheelchair or bedside chair)?: A Little   Help needed climbing 3-5 steps with a railing? : A Little 6 Click Score: 18    End of Session   Activity Tolerance: Patient tolerated treatment well Patient  left: in chair;with call bell/phone within reach Nurse Communication: Mobility status PT Visit Diagnosis: Unsteadiness on feet (R26.81);Other abnormalities of gait and mobility (R26.89);Difficulty in walking, not elsewhere classified (R26.2)     Time: 5072-2575 PT Time Calculation (min) (ACUTE ONLY): 28 min  Charges:  $Gait Training: 8-22 mins $Therapeutic Activity: 8-22 mins                     10/30/2018  Donnella Sham, PT Acute Rehabilitation Services [phone]  (pager) [phone]  (office)   Alexander Hatfield 10/30/2018, 2:24 PM

## 2018-10-30 NOTE — Care Management Important Message (Signed)
Important Message  Patient Details  Name: Alexander Hatfield. MRN: [id number] Date of Birth: 08-Apr-1956   Medicare Important Message Given:  Yes    Antoni Stefan 10/30/2018, 3:23 PM

## 2018-10-30 NOTE — Care Management Note (Signed)
Case Management Note  Patient Details  Name: Alexander Hatfield. MRN: [id number] Date of Birth: [birth year]  Subjective/Objective:    Pt is s/p cervical surgery. He is from home with his spouse. (spouse with MS).  Pt denies issues obtaining his medications. Pt has transportation.                Action/Plan: Pt discharging home with orders for [facility] services. CM provided choice and [facility] selected. Cory with [facility] notified and accepted the referral.  Pt with orders for walker and 3 in 1. James with [facility] made aware and will deliver to the room. Pt asked that prescriptions be faxed to [facility] on [address]. CM faxed and placed in D/c packet. Pt states he has transportation home.   Expected Discharge Date:  10/30/18               Expected Discharge Plan:  [place]  In-House Referral:     Discharge planning Services  CM Consult  Post Acute Care Choice:  Durable Medical Equipment, Home Health Choice offered to:  Patient  DME Arranged:  3-N-1, [facility] D[facility] Agency:  [place] Arranged:  PT, OT [facility] Agency:  [place]  Status of Service:  Completed, signed off  If discussed at [place] of Stay Meetings, dates discussed:    Additional Comments:  Pollie Friar, RN 10/30/2018, 10:15 AM

## 2018-10-30 NOTE — Discharge Summary (Signed)
Physician Discharge Summary  Patient ID: Alexander Hatfield. MRN: [id number] DOB/AGE: July 24, 1956 62 y.o.  Admit date: 10/27/2018 Discharge date: 10/30/2018  Admission Diagnoses: Cervical spondylitic myelopathy from severe cervical stenosis C3-4 C4-5     Discharge Diagnoses: same   Discharged Condition: good  Hospital Course: The patient was admitted on 10/27/2018 and taken to the operating room where the patient underwent acdf C3-4, 4-5. The patient tolerated the procedure well and was taken to the recovery room and then to the floor in stable condition. The hospital course was routine. There were no complications. The wound remained clean dry and intact. Pt had appropriate neck soreness. No complaints of arm pain or new N/T/W. The patient remained afebrile with stable vital signs, and tolerated a regular diet. The patient continued to increase activities, and pain was well controlled with oral pain medications.   Consults: None  Significant Diagnostic Studies:  Results for orders placed or performed during the hospital encounter of 10/27/18  Comprehensive metabolic panel  Result Value Ref Range   Sodium 141 135 - 145 mmol/L   Potassium 3.9 3.5 - 5.1 mmol/L   Chloride 106 98 - 111 mmol/L   CO2 25 22 - 32 mmol/L   Glucose, Bld 96 70 - 99 mg/dL   BUN 10 8 - 23 mg/dL   Creatinine, Ser 0.90 0.61 - 1.24 mg/dL   Calcium 9.2 8.9 - 10.3 mg/dL   Total Protein 6.8 6.5 - 8.1 g/dL   Albumin 3.5 3.5 - 5.0 g/dL   AST 17 15 - 41 U/L   ALT 10 0 - 44 U/L   Alkaline Phosphatase 85 38 - 126 U/L   Total Bilirubin 0.5 0.3 - 1.2 mg/dL   GFR calc non Af Amer >60 >60 mL/min   GFR calc Af Amer >60 >60 mL/min   Anion gap 10 5 - 15  CBC  Result Value Ref Range   WBC 11.1 (H) 4.0 - 10.5 K/uL   RBC 4.36 4.22 - 5.81 MIL/uL   Hemoglobin 12.7 (L) 13.0 - 17.0 g/dL   HCT 40.3 39.0 - 52.0 %   MCV 92.4 80.0 - 100.0 fL   MCH 29.1 26.0 - 34.0 pg   MCHC 31.5 30.0 - 36.0 g/dL   RDW 12.7 11.5 - 15.5 %    Platelets 286 150 - 400 K/uL   nRBC 0.0 0.0 - 0.2 %  Protime-INR  Result Value Ref Range   Prothrombin Time 13.3 11.4 - 15.2 seconds   INR 1.01   APTT  Result Value Ref Range   aPTT 35 24 - 36 seconds  Glucose, capillary  Result Value Ref Range   Glucose-Capillary 135 (H) 70 - 99 mg/dL    Dg Cervical Spine 2-3 Views  Result Date: 10/27/2018 CLINICAL DATA:  62 year old male for C3-C5 fusion. Subsequent encounter. EXAM: DG C-ARM 61-120 MIN; CERVICAL SPINE - 2-3 VIEW Fluoroscopic time: 8 seconds. COMPARISON:  10/06/2018 CT. FINDINGS: Two intraoperative C-arm lateral views of the cervical spine submitted for review after surgery. C3-4 and C4-5 fusion with anterior plate, screws and interbody spacers. The C5 screws are slightly poorly delineated secondary to overlying shoulders. This can be assessed on follow-up. Patient is intubated. Sponge may be in place. IMPRESSION: Fusion C3-C5 as noted above. Electronically Signed   By: Genia Del M.D.   On: 10/27/2018 17:50   Ct Cervical Spine Wo Contrast  Result Date: 10/07/2018 CLINICAL DATA:  62 year old male with bilateral hand and feet numbness for the past  month. History of neck and back surgery (less surgery 2014). Denies injury. Subsequent encounter. EXAM: CT CERVICAL SPINE WITHOUT CONTRAST TECHNIQUE: Multidetector CT imaging of the cervical spine was performed without intravenous contrast. Multiplanar CT image reconstructions were also generated. COMPARISON:  09/25/2018 plain film exam. FINDINGS: Alignment: Curvature convex right with straightening on lateral view. Skull base and vertebrae: Evaluation limited by patient's habitus. Prior fusion C5-C7. Soft tissues: No abnormal prevertebral soft tissue swelling. Disc levels: C2-3: Congenitally narrow canal. Minimal bulge with slight narrowing ventral thecal sac. C3-4: Congenitally narrow canal. Moderate bulge/broad-based partially calcified protrusion. Calcification ligamentum flavum. Multifactorial  moderate spinal stenosis and circumferential cord compression. Very mild bilateral foraminal narrowing. C4-5: Broad-based disc osteophyte complex greater to right. Mild cord flattening greater on right. Uncinate hypertrophy greater on right with moderate right-sided and mild left-sided foraminal narrowing. Far right lateral gas containing disc. C5-6: Evaluation limited by habitus. Prior fusion. Broad-based spur greater to right. Mild right-sided cord flattening. Minimal right-sided foraminal narrowing. C6-7: Evaluation limited by habitus. Prior fusion. Broad-based spur greater to right. Mild right-sided cord flattening. C7-T1: Evaluation limited by habitus. Broad-based disc osteophyte complex. Slight narrowing ventral thecal sac. Uncovertebral hypertrophy and facet degenerative changes. Mild bilateral foraminal narrowing. Upper chest: No worrisome abnormality. Other: Enlarged right lobe of thyroid gland possibly containing a 2.5 cm low-density structure. Evaluation limited. Ultrasound recommended for further delineation. Bilateral carotid bifurcation calcifications. Enlarged dense right parotid duct. Streak artifact from dental work. No obvious stone or evidence of right parotid gland inflammation. IMPRESSION: 1. Examination is limited by patient's habitus. 2. Prior fusion C5-C7. 3. Congenitally narrowed cervical canal with superimposed degenerative changes. 4. Spinal stenosis and cord flattening greatest C3-4 level. Summary of pertinent findings includes (detailed description above); C2-3 minimal bulge with slight narrowing ventral thecal sac. C3-4 multifactorial moderate spinal stenosis and circumferential moderate cord compression. Very mild bilateral foraminal narrowing. C4-5 mild cord flattening greater on right. Moderate right-sided and mild left-sided foraminal narrowing. Far right lateral gas containing disc. C5-6 mild right-sided cord flattening. Minimal right-sided foraminal narrowing. C6-7 mild  right-sided cord flattening. C7-T1 slight narrowing ventral thecal sac. Mild bilateral foraminal narrowing. 5. Enlarged right lobe of thyroid gland possibly containing a 2.5 cm low-density structure. Evaluation limited. Ultrasound recommended for further delineation. 6.    Bilateral carotid bifurcation calcifications. 7. Enlarged dense right parotid duct. Streak artifact from dental work. No obvious stone or evidence of right parotid gland inflammation. Patient would benefit from cervical spine MR if possible. Call is into referring clinician. Electronically Signed   By: Genia Del M.D.   On: 10/07/2018 09:46   Nm Pet Image Initial (pi) Skull Base To Thigh  Result Date: 10/24/2018 CLINICAL DATA:  Initial treatment strategy for spiculated right upper lobe mass, presumed bronchogenic carcinoma. EXAM: NUCLEAR MEDICINE PET SKULL BASE TO THIGH TECHNIQUE: 14.1 mCi F-18 FDG was injected intravenously. Full-ring PET imaging was performed from the skull base to thigh after the radiotracer. CT data was obtained and used for attenuation correction and anatomic localization. Fasting blood glucose: 88 mg/dl COMPARISON:  Chest CT 10/17/2018.  Cervical spine CT 10/06/2018. FINDINGS: Mediastinal blood pool activity: SUV max 2.9 NECK: There is an intensely hypermetabolic nodule posterior to the left mandibular angle, demonstrating an SUV max of 21.9. This corresponds with a 9 mm soft tissue nodule on image 25/4. This could reflect a lymph node or deep parotid lesion. No other hypermetabolic cervical lymph nodes are identified.There are no lesions of the pharyngeal mucosal space. Incidental CT findings: Bilateral  carotid atherosclerosis. There is asymmetric enlargement of the right thyroid lobe without hypermetabolic activity. CHEST: The spiculated mass posteriorly in the right upper lobe is hypermetabolic with an SUV max of 10.3. This mass measures 3.6 x 4.1 cm on image 30/8. Enlarged right paratracheal node measuring 2.3 cm  short axis on image 68/4 is also hypermetabolic with an SUV max of 11.3. No other hypermetabolic thoracic adenopathy or suspicious pulmonary nodules. Incidental CT findings: Atherosclerosis of the aorta, great vessels and coronary arteries. Mild emphysema. ABDOMEN/PELVIS: There is no hypermetabolic activity within the liver, adrenal glands, spleen or pancreas. There is no hypermetabolic nodal activity. Incidental CT findings: Aortic and branch vessel atherosclerosis. Mild hepatic steatosis. SKELETON: There is no hypermetabolic activity to suggest osseous metastatic disease. Incidental CT findings: There is hypermetabolic activity associated with the left common hamstring tendon consistent with tendinosis or partial tearing. There are postsurgical changes in the lower lumbar spine. Patient has a thoracic spinal stimulator and mild bilateral gynecomastia. IMPRESSION: 1. Intensely hypermetabolic soft tissue nodule posterior to the left mandibular angle could reflect a hypermetabolic lymph node or a primary parotid lesion. No lesion of the pharyngeal mucosal space identified. ENT evaluation recommended. This is not likely metastatic from the presumed right upper lobe bronchogenic carcinoma. 2. Spiculated right upper lobe lung mass and enlarged right paratracheal lymph node are hypermetabolic, consistent with bronchogenic carcinoma. Presuming the left neck nodule is unrelated, this represents stage IIIA disease (T2b N2 M0). Electronically Signed   By: Richardean Sale M.D.   On: 10/24/2018 14:08   Dg Chest Port 1 View  Result Date: 10/29/2018 CLINICAL DATA:  Status post bronchoscopy. EXAM: PORTABLE CHEST 1 VIEW COMPARISON:  Radiographs of October 16, 2018. FINDINGS: Stable cardiomediastinal silhouette is noted. Atherosclerosis of thoracic aorta is noted. No pneumothorax is noted. Right suprahilar mass is again noted concerning for malignancy. Increased right upper lobe airspace opacity is noted most consistent with  atelectasis. Bony thorax is unremarkable. IMPRESSION: No pneumothorax status post bronchoscopy. New right upper lobe airspace opacity is noted most consistent with atelectasis. Stable right suprahilar mass is noted. Electronically Signed   By: Marijo Conception, M.D.   On: 10/29/2018 13:07   Dg C-arm 1-60 Min  Result Date: 10/27/2018 CLINICAL DATA:  62 year old male for C3-C5 fusion. Subsequent encounter. EXAM: DG C-ARM 61-120 MIN; CERVICAL SPINE - 2-3 VIEW Fluoroscopic time: 8 seconds. COMPARISON:  10/06/2018 CT. FINDINGS: Two intraoperative C-arm lateral views of the cervical spine submitted for review after surgery. C3-4 and C4-5 fusion with anterior plate, screws and interbody spacers. The C5 screws are slightly poorly delineated secondary to overlying shoulders. This can be assessed on follow-up. Patient is intubated. Sponge may be in place. IMPRESSION: Fusion C3-C5 as noted above. Electronically Signed   By: Genia Del M.D.   On: 10/27/2018 17:50   Dg C-arm Bronchoscopy  Result Date: 10/29/2018 C-ARM BRONCHOSCOPY: Fluoroscopy was utilized by the requesting physician.  No radiographic interpretation.    Antibiotics:  Anti-infectives (From admission, onward)   Start     Dose/Rate Route Frequency Ordered Stop   10/28/18 1000  levofloxacin (LEVAQUIN) tablet 500 mg  Status:  Discontinued     500 mg Oral Daily 10/27/18 2332 10/27/18 2336   10/27/18 2100  ceFAZolin (ANCEF) 3 g in dextrose 5 % 50 mL IVPB     3 g 100 mL/hr over 30 Minutes Intravenous Every 8 hours 10/27/18 1954 10/28/18 0700   10/27/18 1649  bacitracin 50,000 Units in sodium chloride 0.9 %  500 mL irrigation  Status:  Discontinued       As needed 10/27/18 1649 10/27/18 1906   10/27/18 1116  ceFAZolin (ANCEF) 2-4 GM/100ML-% IVPB    Note to Pharmacy:  Therese Sarah   : cabinet override      10/27/18 1116 10/27/18 2329   10/27/18 1115  ceFAZolin (ANCEF) 3 g in dextrose 5 % 50 mL IVPB     3 g 100 mL/hr over 30 Minutes  Intravenous On call to O.R. 10/27/18 1111 10/27/18 1507      Discharge Exam: Blood pressure 131/81, pulse 68, temperature 98 F (36.7 C), temperature source Oral, resp. rate 16, height 6\' 1"  (1.854 m), weight 131.5 kg, SpO2 95 %. Neurologic: Grossly normal Ambulating and voiding  Discharge Medications:   Allergies as of 10/30/2018      Reactions   Iohexol Other (See Comments)    Desc: PT STATES THAT WHEN GIVEN THE CONTRAST HIS LEGS BURN AND HE PASSES OUT, COMA FOR 7DAY. DR Thornton Papas WOULD NOT PRE MEDICATE DUE TO SEVERITY OF REACTION, STUDY WAS CX      Medication List    TAKE these medications   amLODipine 10 MG tablet Commonly known as:  NORVASC Take 10 mg by mouth daily.   baclofen 20 MG tablet Commonly known as:  LIORESAL Take 20 mg by mouth 3 (three) times daily as needed for muscle spasms.   cyclobenzaprine 10 MG tablet Commonly known as:  FLEXERIL Take 1 tablet (10 mg total) by mouth 3 (three) times daily as needed for muscle spasms.   furosemide 20 MG tablet Commonly known as:  LASIX Take 20 mg by mouth daily.   gabapentin 300 MG capsule Commonly known as:  NEURONTIN Take 600 mg by mouth 3 (three) times daily.   levofloxacin 500 MG tablet Commonly known as:  LEVAQUIN Take 500 mg by mouth daily.   losartan 100 MG tablet Commonly known as:  COZAAR Take 100 mg by mouth daily.   meloxicam 15 MG tablet Commonly known as:  MOBIC Take 15 mg by mouth daily.   oxycodone 30 MG immediate release tablet Commonly known as:  ROXICODONE Take 30 mg by mouth every 8 (eight) hours. What changed:  Another medication with the same name was added. Make sure you understand how and when to take each.   oxyCODONE 5 MG immediate release tablet Commonly known as:  ROXICODONE Take 1 tablet (5 mg total) by mouth every 6 (six) hours as needed. What changed:  You were already taking a medication with the same name, and this prescription was added. Make sure you understand how and when  to take each.   potassium chloride SA 20 MEQ tablet Commonly known as:  K-DUR,KLOR-CON Take 20 mEq by mouth every evening.   pseudoephedrine 120 MG 12 hr tablet Commonly known as:  SUDAFED Take 120 mg by mouth 2 (two) times daily as needed for congestion.       Disposition: home   Final Dx: acdf C3-4, 4-5  Discharge Instructions    Call MD for:  difficulty breathing, headache or visual disturbances   Complete by:  As directed    Call MD for:  hives   Complete by:  As directed    Call MD for:  persistant dizziness or light-headedness   Complete by:  As directed    Call MD for:  persistant nausea and vomiting   Complete by:  As directed    Call MD for:  redness, tenderness, or  signs of infection (pain, swelling, redness, odor or green/yellow discharge around incision site)   Complete by:  As directed    Call MD for:  severe uncontrolled pain   Complete by:  As directed    Call MD for:  temperature >100.4   Complete by:  As directed    Diet - low sodium heart healthy   Complete by:  As directed    Driving Restrictions   Complete by:  As directed    No driving for 2 weeks, no riding in the car for 1 week   Increase activity slowly   Complete by:  As directed    Lifting restrictions   Complete by:  As directed    No lifting more than 8 lbs   Remove dressing in 24 hours   Complete by:  As directed          Signed: Ocie Cornfield Lonie Rummell 10/30/2018, 8:51 AM

## 2018-10-30 NOTE — Progress Notes (Signed)
Occupational Therapy Treatment Patient Details Name: Alexander Hatfield. MRN: [id number] DOB: 03/31/1956 Today's Date: 10/30/2018    History of present illness 62 year old with worsening myelopathy with weakness in his hands and legs difficulty walking workup revealed severe cervical stenosis C3-4 C4-5. Underwent C3-4 C4-5 ANTERIOR CERVICAL DECOMPRESSION/DISCECTOMY FUSION 2 LEVELS .  Preop chest x-ray showed right upper lobe lung mass - plan for lung biopsy 12/18.   OT comments  Pt much improved ability to control LLE, however remains a fall risk. DC plan is for home with HHOT. Reviewed cervical precautions for ADL and mobility. Pt requires vc to follow precautions. Also educated on energy conservation due to getting SOB with ADL activity.   Follow Up Recommendations  Home health OT;Supervision/Assistance - 24 hour    Equipment Recommendations  3 in 1 bedside commode    Recommendations for Other Services      Precautions / Restrictions Precautions Precautions: Cervical Required Braces or Orthoses: Cervical Brace Cervical Brace: Soft collar Restrictions Weight Bearing Restrictions: No       Mobility Bed Mobility Overal bed mobility: Modified Independent                Transfers Overall transfer level: Needs assistance   Transfers: Sit to/from Stand;Stand Pivot Transfers Sit to Stand: Supervision Stand pivot transfers: Supervision            Balance Overall balance assessment: History of Falls;Needs assistance   Sitting balance-Leahy Scale: Good       Standing balance-Leahy Scale: Fair Standing balance comment: still mostly reliant on AD or external support                           ADL either performed or assessed with clinical judgement   ADL Overall ADL's : Needs assistance/impaired                                     Functional mobility during ADLs: Supervision/safety;Rolling walker;Cueing for safety General ADL Comments:  Reviewed cervical precautions for ADL; Educated on energy conservation strategies      Vision       Perception     Praxis      Cognition Arousal/Alertness: Awake/alert Behavior During Therapy: WFL for tasks assessed/performed Overall Cognitive Status: Within Functional Limits for tasks assessed                                 General Comments: Pt with decreaed safety awareness at times; multiple cues to follow cervical precautions; cues tat times for problem solving with use of RW in tight spaces        Exercises     Shoulder Instructions       General Comments      Pertinent Vitals/ Pain       Pain Assessment: 0-10 Pain Score: 2  Pain Location: neck Pain Descriptors / Indicators: Operative site guarding;Sore Pain Intervention(s): Limited activity within patient's tolerance  Home Living                                          Prior Functioning/Environment              Frequency  Min 3X/week  Progress Toward Goals  OT Goals(current goals can now be found in the care plan section)  Progress towards OT goals: Progressing toward goals  Acute Rehab OT Goals Patient Stated Goal: "to go home and get back to normal" OT Goal Formulation: With patient Time For Goal Achievement: 11/11/18 Potential to Achieve Goals: Good ADL Goals Pt Will Perform Grooming: with modified independence;standing Pt Will Perform Upper Body Bathing: with modified independence;sitting Pt Will Perform Lower Body Bathing: with modified independence;sit to/from stand;with adaptive equipment Pt Will Perform Upper Body Dressing: with modified independence;sitting Pt Will Perform Lower Body Dressing: with modified independence;with adaptive equipment;sit to/from stand Pt Will Transfer to Toilet: with modified independence;bedside commode;ambulating Pt Will Perform Toileting - Clothing Manipulation and hygiene: with modified independence;sit to/from  stand  Plan Discharge plan needs to be updated    Co-evaluation                 AM-PAC OT "6 Clicks" Daily Activity     Outcome Measure   Help from another person eating meals?: None Help from another person taking care of personal grooming?: None Help from another person toileting, which includes using toliet, bedpan, or urinal?: None Help from another person bathing (including washing, rinsing, drying)?: A Little Help from another person to put on and taking off regular upper body clothing?: None Help from another person to put on and taking off regular lower body clothing?: A Little 6 Click Score: 22    End of Session Equipment Utilized During Treatment: Rolling walker  OT Visit Diagnosis: Unsteadiness on feet (R26.81);Other abnormalities of gait and mobility (R26.89);Muscle weakness (generalized) (M62.81);History of falling (Z91.81);Pain Pain - part of body: (neck)   Activity Tolerance Patient tolerated treatment well   Patient Left in bed;with call bell/phone within reach   Nurse Communication Mobility status;Other (comment)(DC needs)        Time: 4827-0786 OT Time Calculation (min): 15 min  Charges: OT General Charges $OT Visit: 1 Visit OT Treatments $Self Care/Home Management : 8-22 mins  Maurie Boettcher, OT/L   Acute OT Clinical Specialist [place] Pager [phone] Office [phone]    [facility] 10/30/2018, 9:37 AM

## 2018-10-31 DIAGNOSIS — M4802 Spinal stenosis, cervical region: Secondary | ICD-10-CM | POA: Diagnosis not present

## 2018-10-31 DIAGNOSIS — G4733 Obstructive sleep apnea (adult) (pediatric): Secondary | ICD-10-CM | POA: Diagnosis not present

## 2018-10-31 DIAGNOSIS — I10 Essential (primary) hypertension: Secondary | ICD-10-CM | POA: Diagnosis not present

## 2018-10-31 DIAGNOSIS — M4712 Other spondylosis with myelopathy, cervical region: Secondary | ICD-10-CM | POA: Diagnosis not present

## 2018-10-31 DIAGNOSIS — Z4789 Encounter for other orthopedic aftercare: Secondary | ICD-10-CM | POA: Diagnosis not present

## 2018-11-03 ENCOUNTER — Telehealth: Payer: Self-pay | Admitting: Pulmonary Disease

## 2018-11-03 DIAGNOSIS — Z4789 Encounter for other orthopedic aftercare: Secondary | ICD-10-CM | POA: Diagnosis not present

## 2018-11-03 DIAGNOSIS — M4802 Spinal stenosis, cervical region: Secondary | ICD-10-CM | POA: Diagnosis not present

## 2018-11-03 DIAGNOSIS — I10 Essential (primary) hypertension: Secondary | ICD-10-CM | POA: Diagnosis not present

## 2018-11-03 DIAGNOSIS — M4712 Other spondylosis with myelopathy, cervical region: Secondary | ICD-10-CM | POA: Diagnosis not present

## 2018-11-03 DIAGNOSIS — C3492 Malignant neoplasm of unspecified part of left bronchus or lung: Secondary | ICD-10-CM

## 2018-11-03 DIAGNOSIS — G4733 Obstructive sleep apnea (adult) (pediatric): Secondary | ICD-10-CM | POA: Diagnosis not present

## 2018-11-03 NOTE — Telephone Encounter (Signed)
Procedure was performed by dr Ander Slade Please let pt know - Biopsy of lymph node confirms adenocarcinoma lung  Please refer to [facility] / Dr Earlie Server

## 2018-11-03 NOTE — Telephone Encounter (Signed)
Call made to patient, made aware results have not yet ben received. Informed patient I would send Dr. Elsworth Soho a message informing him he was looking for the results Voiced understanding.   RA please advise of biopsy results.

## 2018-11-04 ENCOUNTER — Ambulatory Visit: Payer: Medicare Other | Admitting: Adult Health

## 2018-11-04 ENCOUNTER — Ambulatory Visit: Payer: Medicare Other | Admitting: Pulmonary Disease

## 2018-11-04 ENCOUNTER — Encounter: Payer: Self-pay | Admitting: Adult Health

## 2018-11-04 ENCOUNTER — Telehealth: Payer: Self-pay | Admitting: *Deleted

## 2018-11-04 ENCOUNTER — Telehealth: Payer: Self-pay | Admitting: Adult Health

## 2018-11-04 VITALS — BP 126/80 | HR 90 | Ht 72.0 in | Wt 293.0 lb

## 2018-11-04 DIAGNOSIS — C3411 Malignant neoplasm of upper lobe, right bronchus or lung: Secondary | ICD-10-CM | POA: Diagnosis not present

## 2018-11-04 DIAGNOSIS — C3492 Malignant neoplasm of unspecified part of left bronchus or lung: Secondary | ICD-10-CM

## 2018-11-04 DIAGNOSIS — J449 Chronic obstructive pulmonary disease, unspecified: Secondary | ICD-10-CM

## 2018-11-04 DIAGNOSIS — C3490 Malignant neoplasm of unspecified part of unspecified bronchus or lung: Secondary | ICD-10-CM | POA: Insufficient documentation

## 2018-11-04 NOTE — Patient Instructions (Signed)
Refer to Dr. Earlie Server -Oncology  Refer to [facility] .  Follow up with Dr. Elsworth Soho  In 4-6 weeks with PFT and As needed

## 2018-11-04 NOTE — Progress Notes (Signed)
@Patient  ID: Alexander Hatfield., male    DOB: 10-06-1956, 62 y.o.   MRN: [id number]  Chief Complaint  Patient presents with  . Follow-up    COPD     Referring provider: Asencion Noble, MD  HPI: 62 year old male former smoker seen for initial pulmonary consult October 21, 2018 for lung mass . Retired Engineer, structural disabled since the late 80s due to chronic back pain.  Multiple back surgeries and required several spinal stimulators.  TEST/EVENTS :  CT chest on 10/17/2018 showed a 40 x 30 mm angulated right upper lobe lung mass associated with a 24 mm precarinal lymph node.   11/04/2018 Follow up : Lung Mass, PFT  Patient returns for a 2-week follow-up.  Patient was seen last visit for a pulmonary consult.  was undergoing preop clearance workup for neck surgery . Chest xray showed lung mass . Subsequent CT chest showed right upper lobe lung mass measuring 40 x 30 mm on CT chest October 17, 2018.  PET scan showed intensely hypermetabolic soft tissue nodule posterior to the left mandibular angle.  Spiculated right upper lobe lung mass and enlarged peritracheal lymph node that are hypermetabolic. Patient underwent EBUS on 10/29/2018 lymph node (4R node) pathology returned positive for malignant cells consistent with non-small cell carcinoma.  Features slightly favor adenocarcinoma. Patient was set up for PFTs today.  Unfortunately patient was unable to complete PFTs or spirometry.  Says he just not able to breathe out that long.Marland Kitchen He had neck surgery on 12/16 .  It is hard for him to stand for long periods of time. Has soft cervical collar on.  Went over test results and next step in work up process.  He does not carry a diagnosis of COPD.  Says he does not have shortness of breath or cough.  Had no weight loss or hemoptysis.    Allergies  Allergen Reactions  . Iohexol Other (See Comments)     Desc: PT STATES THAT WHEN GIVEN THE CONTRAST HIS LEGS BURN AND HE PASSES OUT, COMA FOR 7DAY. DR Thornton Papas  WOULD NOT PRE MEDICATE DUE TO SEVERITY OF REACTION, STUDY WAS CX     Immunization History  Administered Date(s) Administered  . Influenza-Unspecified 07/13/2014    Past Medical History:  Diagnosis Date  . Arthritis   . Chronic back pain   . Chronic back pain   . Headache   . HOH (hard of hearing)   . Hypertension   . Lung mass   . Pre-diabetes   . Sleep apnea    wears cpap  . Wears glasses   . Wears partial dentures     Tobacco History: Social History   Tobacco Use  Smoking Status Former Smoker  . Types: Cigarettes  Smokeless Tobacco Never Used  Tobacco Comment   Quit smoking cigarettes 15 years ago   Counseling given: Not Answered Comment: Quit smoking cigarettes 15 years ago   Outpatient Medications Prior to Visit  Medication Sig Dispense Refill  . amLODipine (NORVASC) 10 MG tablet Take 10 mg by mouth daily.     . baclofen (LIORESAL) 20 MG tablet Take 20 mg by mouth 3 (three) times daily as needed for muscle spasms.     . cyclobenzaprine (FLEXERIL) 10 MG tablet Take 1 tablet (10 mg total) by mouth 3 (three) times daily as needed for muscle spasms. 30 tablet 0  . furosemide (LASIX) 20 MG tablet Take 20 mg by mouth daily.     Marland Kitchen gabapentin (NEURONTIN)  300 MG capsule Take 600 mg by mouth 3 (three) times daily.     Marland Kitchen losartan (COZAAR) 100 MG tablet Take 100 mg by mouth daily.     . meloxicam (MOBIC) 15 MG tablet Take 15 mg by mouth daily.     Marland Kitchen oxycodone (ROXICODONE) 30 MG immediate release tablet Take 30 mg by mouth every 8 (eight) hours.     Marland Kitchen oxyCODONE (ROXICODONE) 5 MG immediate release tablet Take 1 tablet (5 mg total) by mouth every 6 (six) hours as needed. 20 tablet 0  . potassium chloride SA (K-DUR,KLOR-CON) 20 MEQ tablet Take 20 mEq by mouth every evening.     . pseudoephedrine (SUDAFED) 120 MG 12 hr tablet Take 120 mg by mouth 2 (two) times daily as needed for congestion.    Marland Kitchen levofloxacin (LEVAQUIN) 500 MG tablet Take 500 mg by mouth daily.      No  facility-administered medications prior to visit.      Review of Systems  Constitutional:   No  weight loss, night sweats,  Fevers, chills, fatigue, or  lassitude.  HEENT:   No headaches,  Difficulty swallowing,  Tooth/dental problems, or  Sore throat,                No sneezing, itching, ear ache, nasal congestion, post nasal drip,   CV:  No chest pain,  Orthopnea, PND, swelling in lower extremities, anasarca, dizziness, palpitations, syncope.   GI  No heartburn, indigestion, abdominal pain, nausea, vomiting, diarrhea, change in bowel habits, loss of appetite, bloody stools.   Resp: No shortness of breath with exertion or at rest.  No excess mucus, no productive cough,  No non-productive cough,  No coughing up of blood.  No change in color of mucus.  No wheezing.  No chest wall deformity  Skin: no rash or lesions.  GU: no dysuria, change in color of urine, no urgency or frequency.  No flank pain, no hematuria   MS:  No joint pain or swelling.  No decreased range of motion.  +neck pain     Physical Exam  BP 126/80 (BP Location: Right Arm, Patient Position: Sitting, Cuff Size: Normal)   Pulse 90   Ht 6' (1.829 m)   Wt 293 lb (132.9 kg)   SpO2 97%   BMI 39.74 kg/m   GEN: A/Ox3; pleasant , NAD, obese    HEENT:  [place]/AT,  EACs-clear, TMs-wnl, NOSE-clear, THROAT-clear, no lesions, no postnasal drip or exudate noted.   NECK:  Supple w/ fair ROM; no JVD; normal carotid impulses w/o bruits; no thyromegaly or nodules palpated; no lymphadenopathy.  Soft cervical collar   RESP  Clear  P & A; w/o, wheezes/ rales/ or rhonchi. no accessory muscle use, no dullness to percussion  CARD:  RRR, no m/r/g, no peripheral edema, pulses intact, no cyanosis or clubbing.  GI:   Soft & nt; nml bowel sounds; no organomegaly or masses detected.   Musco: Warm bil, no deformities or joint swelling noted.   Neuro: alert, no focal deficits noted.    Skin: Warm, no lesions or rashes    Lab  Results:   BNP No results found for: BNP  ProBNP No results found for: PROBNP  Imaging: Dg Cervical Spine 2-3 Views  Result Date: 10/27/2018 CLINICAL DATA:  62 year old male for C3-C5 fusion. Subsequent encounter. EXAM: DG C-ARM 61-120 MIN; CERVICAL SPINE - 2-3 VIEW Fluoroscopic time: 8 seconds. COMPARISON:  10/06/2018 CT. FINDINGS: Two intraoperative C-arm lateral views of the  cervical spine submitted for review after surgery. C3-4 and C4-5 fusion with anterior plate, screws and interbody spacers. The C5 screws are slightly poorly delineated secondary to overlying shoulders. This can be assessed on follow-up. Patient is intubated. Sponge may be in place. IMPRESSION: Fusion C3-C5 as noted above. Electronically Signed   By: Genia Del M.D.   On: 10/27/2018 17:50   Ct Cervical Spine Wo Contrast  Result Date: 10/07/2018 CLINICAL DATA:  62 year old male with bilateral hand and feet numbness for the past month. History of neck and back surgery (less surgery 2014). Denies injury. Subsequent encounter. EXAM: CT CERVICAL SPINE WITHOUT CONTRAST TECHNIQUE: Multidetector CT imaging of the cervical spine was performed without intravenous contrast. Multiplanar CT image reconstructions were also generated. COMPARISON:  09/25/2018 plain film exam. FINDINGS: Alignment: Curvature convex right with straightening on lateral view. Skull base and vertebrae: Evaluation limited by patient's habitus. Prior fusion C5-C7. Soft tissues: No abnormal prevertebral soft tissue swelling. Disc levels: C2-3: Congenitally narrow canal. Minimal bulge with slight narrowing ventral thecal sac. C3-4: Congenitally narrow canal. Moderate bulge/broad-based partially calcified protrusion. Calcification ligamentum flavum. Multifactorial moderate spinal stenosis and circumferential cord compression. Very mild bilateral foraminal narrowing. C4-5: Broad-based disc osteophyte complex greater to right. Mild cord flattening greater on right.  Uncinate hypertrophy greater on right with moderate right-sided and mild left-sided foraminal narrowing. Far right lateral gas containing disc. C5-6: Evaluation limited by habitus. Prior fusion. Broad-based spur greater to right. Mild right-sided cord flattening. Minimal right-sided foraminal narrowing. C6-7: Evaluation limited by habitus. Prior fusion. Broad-based spur greater to right. Mild right-sided cord flattening. C7-T1: Evaluation limited by habitus. Broad-based disc osteophyte complex. Slight narrowing ventral thecal sac. Uncovertebral hypertrophy and facet degenerative changes. Mild bilateral foraminal narrowing. Upper chest: No worrisome abnormality. Other: Enlarged right lobe of thyroid gland possibly containing a 2.5 cm low-density structure. Evaluation limited. Ultrasound recommended for further delineation. Bilateral carotid bifurcation calcifications. Enlarged dense right parotid duct. Streak artifact from dental work. No obvious stone or evidence of right parotid gland inflammation. IMPRESSION: 1. Examination is limited by patient's habitus. 2. Prior fusion C5-C7. 3. Congenitally narrowed cervical canal with superimposed degenerative changes. 4. Spinal stenosis and cord flattening greatest C3-4 level. Summary of pertinent findings includes (detailed description above); C2-3 minimal bulge with slight narrowing ventral thecal sac. C3-4 multifactorial moderate spinal stenosis and circumferential moderate cord compression. Very mild bilateral foraminal narrowing. C4-5 mild cord flattening greater on right. Moderate right-sided and mild left-sided foraminal narrowing. Far right lateral gas containing disc. C5-6 mild right-sided cord flattening. Minimal right-sided foraminal narrowing. C6-7 mild right-sided cord flattening. C7-T1 slight narrowing ventral thecal sac. Mild bilateral foraminal narrowing. 5. Enlarged right lobe of thyroid gland possibly containing a 2.5 cm low-density structure. Evaluation  limited. Ultrasound recommended for further delineation. 6.    Bilateral carotid bifurcation calcifications. 7. Enlarged dense right parotid duct. Streak artifact from dental work. No obvious stone or evidence of right parotid gland inflammation. Patient would benefit from cervical spine MR if possible. Call is into referring clinician. Electronically Signed   By: Genia Del M.D.   On: 10/07/2018 09:46   Nm Pet Image Initial (pi) Skull Base To Thigh  Result Date: 10/24/2018 CLINICAL DATA:  Initial treatment strategy for spiculated right upper lobe mass, presumed bronchogenic carcinoma. EXAM: NUCLEAR MEDICINE PET SKULL BASE TO THIGH TECHNIQUE: 14.1 mCi F-18 FDG was injected intravenously. Full-ring PET imaging was performed from the skull base to thigh after the radiotracer. CT data was obtained and used for attenuation  correction and anatomic localization. Fasting blood glucose: 88 mg/dl COMPARISON:  Chest CT 10/17/2018.  Cervical spine CT 10/06/2018. FINDINGS: Mediastinal blood pool activity: SUV max 2.9 NECK: There is an intensely hypermetabolic nodule posterior to the left mandibular angle, demonstrating an SUV max of 21.9. This corresponds with a 9 mm soft tissue nodule on image 25/4. This could reflect a lymph node or deep parotid lesion. No other hypermetabolic cervical lymph nodes are identified.There are no lesions of the pharyngeal mucosal space. Incidental CT findings: Bilateral carotid atherosclerosis. There is asymmetric enlargement of the right thyroid lobe without hypermetabolic activity. CHEST: The spiculated mass posteriorly in the right upper lobe is hypermetabolic with an SUV max of 10.3. This mass measures 3.6 x 4.1 cm on image 30/8. Enlarged right paratracheal node measuring 2.3 cm short axis on image 68/4 is also hypermetabolic with an SUV max of 11.3. No other hypermetabolic thoracic adenopathy or suspicious pulmonary nodules. Incidental CT findings: Atherosclerosis of the aorta, great  vessels and coronary arteries. Mild emphysema. ABDOMEN/PELVIS: There is no hypermetabolic activity within the liver, adrenal glands, spleen or pancreas. There is no hypermetabolic nodal activity. Incidental CT findings: Aortic and branch vessel atherosclerosis. Mild hepatic steatosis. SKELETON: There is no hypermetabolic activity to suggest osseous metastatic disease. Incidental CT findings: There is hypermetabolic activity associated with the left common hamstring tendon consistent with tendinosis or partial tearing. There are postsurgical changes in the lower lumbar spine. Patient has a thoracic spinal stimulator and mild bilateral gynecomastia. IMPRESSION: 1. Intensely hypermetabolic soft tissue nodule posterior to the left mandibular angle could reflect a hypermetabolic lymph node or a primary parotid lesion. No lesion of the pharyngeal mucosal space identified. ENT evaluation recommended. This is not likely metastatic from the presumed right upper lobe bronchogenic carcinoma. 2. Spiculated right upper lobe lung mass and enlarged right paratracheal lymph node are hypermetabolic, consistent with bronchogenic carcinoma. Presuming the left neck nodule is unrelated, this represents stage IIIA disease (T2b N2 M0). Electronically Signed   By: Richardean Sale M.D.   On: 10/24/2018 14:08   Dg Chest Port 1 View  Result Date: 10/29/2018 CLINICAL DATA:  Status post bronchoscopy. EXAM: PORTABLE CHEST 1 VIEW COMPARISON:  Radiographs of October 16, 2018. FINDINGS: Stable cardiomediastinal silhouette is noted. Atherosclerosis of thoracic aorta is noted. No pneumothorax is noted. Right suprahilar mass is again noted concerning for malignancy. Increased right upper lobe airspace opacity is noted most consistent with atelectasis. Bony thorax is unremarkable. IMPRESSION: No pneumothorax status post bronchoscopy. New right upper lobe airspace opacity is noted most consistent with atelectasis. Stable right suprahilar mass is  noted. Electronically Signed   By: Marijo Conception, M.D.   On: 10/29/2018 13:07   Dg C-arm 1-60 Min  Result Date: 10/27/2018 CLINICAL DATA:  62 year old male for C3-C5 fusion. Subsequent encounter. EXAM: DG C-ARM 61-120 MIN; CERVICAL SPINE - 2-3 VIEW Fluoroscopic time: 8 seconds. COMPARISON:  10/06/2018 CT. FINDINGS: Two intraoperative C-arm lateral views of the cervical spine submitted for review after surgery. C3-4 and C4-5 fusion with anterior plate, screws and interbody spacers. The C5 screws are slightly poorly delineated secondary to overlying shoulders. This can be assessed on follow-up. Patient is intubated. Sponge may be in place. IMPRESSION: Fusion C3-C5 as noted above. Electronically Signed   By: Genia Del M.D.   On: 10/27/2018 17:50   Dg C-arm Bronchoscopy  Result Date: 10/29/2018 C-ARM BRONCHOSCOPY: Fluoroscopy was utilized by the requesting physician.  No radiographic interpretation.      No flowsheet  data found.  No results found for: NITRICOXIDE      Assessment & Plan:   Lung cancer ([place]) CT chest showed right upper lobe lung mass measuring 40 x 30 mm on CT chest October 17, 2018.  PET scan showed intensely hypermetabolic soft tissue nodule posterior to the left mandibular angle.  Spiculated right upper lobe lung mass and enlarged peritracheal lymph node that are hypermetabolic. Patient underwent EBUS on 10/29/2018 lymph node (4R node) pathology returned positive for malignant cells consistent with non-small cell carcinoma.  Features slightly favor adenocarcinoma.  Pt will be referred to Oncology and [place] [facility]  Will try to get PFT/Spirometry in 4 weeks hopefully neck collar will be off by then.       Rexene Edison, NP 11/04/2018

## 2018-11-04 NOTE — Telephone Encounter (Signed)
Oncology Nurse Navigator Documentation  Oncology Nurse Navigator Flowsheets 11/04/2018  Navigator Location [facility]  Referral date to RadOnc/MedOnc 11/04/2018  Navigator Encounter Type Telephone/I received referral on Alexander Hatfield today.  I called to schedule him for an appt here at [facility] but he would like to be seen at [facility].  I will updated new patient coordinator to contact [facility] with referral.   Telephone Outgoing Call  Treatment Phase Pre-Tx/Tx Discussion  Barriers/Navigation Needs Education;Coordination of Care  Education Other  Interventions Coordination of Care;Education  Coordination of Care Other  Acuity Level 2  Time Spent with Patient 30

## 2018-11-04 NOTE — Telephone Encounter (Signed)
Spoke with Linda-patients wife-DPR on file. She states she needs to get back in touch with [facility] to get apt for her husband to meet with group of providers to discuss his treatment plan. Pt at first wanted to go to [facility] for treatment but wife helped him to understand the group that meets at [facility]. Number to [place] at [facility] given to patients wife. Nothing more needed at this time.

## 2018-11-04 NOTE — Assessment & Plan Note (Signed)
CT chest showed right upper lobe lung mass measuring 40 x 30 mm on CT chest October 17, 2018.  PET scan showed intensely hypermetabolic soft tissue nodule posterior to the left mandibular angle.  Spiculated right upper lobe lung mass and enlarged peritracheal lymph node that are hypermetabolic. Patient underwent EBUS on 10/29/2018 lymph node (4R node) pathology returned positive for malignant cells consistent with non-small cell carcinoma.  Features slightly favor adenocarcinoma.  Pt will be referred to Oncology and [place] clinic  Will try to get PFT/Spirometry in 4 weeks hopefully neck collar will be off by then.

## 2018-11-04 NOTE — Progress Notes (Signed)
Pt was unable to complete any parts of the PFT testing today. Tried multiply times to complete the pre spiro, pt asked to stop after 4 tries, and refused to con't the testing. Pt states he was unable to take a deep breathe, and unable to blow out a long breath. Therefore not able to have any data today.

## 2018-11-04 NOTE — Telephone Encounter (Signed)
Patient has an appt with TP today (11/04/18) and will receive results today. Will go ahead and place order for [place] today.

## 2018-11-06 DIAGNOSIS — I10 Essential (primary) hypertension: Secondary | ICD-10-CM | POA: Diagnosis not present

## 2018-11-06 DIAGNOSIS — G4733 Obstructive sleep apnea (adult) (pediatric): Secondary | ICD-10-CM | POA: Diagnosis not present

## 2018-11-06 DIAGNOSIS — Z4789 Encounter for other orthopedic aftercare: Secondary | ICD-10-CM | POA: Diagnosis not present

## 2018-11-06 DIAGNOSIS — M4712 Other spondylosis with myelopathy, cervical region: Secondary | ICD-10-CM | POA: Diagnosis not present

## 2018-11-06 DIAGNOSIS — M4802 Spinal stenosis, cervical region: Secondary | ICD-10-CM | POA: Diagnosis not present

## 2018-11-07 ENCOUNTER — Telehealth: Payer: Self-pay | Admitting: *Deleted

## 2018-11-07 DIAGNOSIS — C3411 Malignant neoplasm of upper lobe, right bronchus or lung: Secondary | ICD-10-CM

## 2018-11-07 NOTE — Telephone Encounter (Signed)
Oncology Nurse Navigator Documentation  Oncology Nurse Navigator Flowsheets 11/07/2018  Navigator Location [facility]  Navigator Encounter Type Telephone/I received a message from scheduling that patient now wants to come to [place] for treatment.  I called and spoke with Alexander Hatfield.  They would like to be seen here.  I gave them an appt for thoracic clinic on 11/20/18. They verbalized understanding of appt time and place.   Telephone Outgoing Call  Treatment Phase Pre-Tx/Tx Discussion  Barriers/Navigation Needs Education;Coordination of Care  Education Other  Interventions Coordination of Care;Education  Coordination of Care Appts  Education Method Verbal  Acuity Level 2  Time Spent with Patient 30

## 2018-11-11 DIAGNOSIS — M4802 Spinal stenosis, cervical region: Secondary | ICD-10-CM | POA: Diagnosis not present

## 2018-11-11 DIAGNOSIS — Z4789 Encounter for other orthopedic aftercare: Secondary | ICD-10-CM | POA: Diagnosis not present

## 2018-11-11 DIAGNOSIS — M4712 Other spondylosis with myelopathy, cervical region: Secondary | ICD-10-CM | POA: Diagnosis not present

## 2018-11-11 DIAGNOSIS — G4733 Obstructive sleep apnea (adult) (pediatric): Secondary | ICD-10-CM | POA: Diagnosis not present

## 2018-11-11 DIAGNOSIS — I10 Essential (primary) hypertension: Secondary | ICD-10-CM | POA: Diagnosis not present

## 2018-11-13 DIAGNOSIS — M4802 Spinal stenosis, cervical region: Secondary | ICD-10-CM | POA: Diagnosis not present

## 2018-11-13 DIAGNOSIS — M4712 Other spondylosis with myelopathy, cervical region: Secondary | ICD-10-CM | POA: Diagnosis not present

## 2018-11-13 DIAGNOSIS — G4733 Obstructive sleep apnea (adult) (pediatric): Secondary | ICD-10-CM | POA: Diagnosis not present

## 2018-11-13 DIAGNOSIS — Z4789 Encounter for other orthopedic aftercare: Secondary | ICD-10-CM | POA: Diagnosis not present

## 2018-11-13 DIAGNOSIS — I10 Essential (primary) hypertension: Secondary | ICD-10-CM | POA: Diagnosis not present

## 2018-11-13 NOTE — Anesthesia Postprocedure Evaluation (Signed)
Anesthesia Post Note  Patient: Alexander Hatfield.  Procedure(s) Performed: VIDEO BRONCHOSCOPY (N/A ) VIDEO BRONCHOSCOPY WITH ENDOBRONCHIAL ULTRASOUND (N/A )     Patient location during evaluation: PACU Anesthesia Type: General Level of consciousness: awake and alert Pain management: pain level controlled Vital Signs Assessment: post-procedure vital signs reviewed and stable Respiratory status: spontaneous breathing, nonlabored ventilation, respiratory function stable and patient connected to nasal cannula oxygen Cardiovascular status: blood pressure returned to baseline and stable Postop Assessment: no apparent nausea or vomiting Anesthetic complications: no    Last Vitals:  Vitals:   10/30/18 0752 10/30/18 1116  BP: 131/81 (!) 145/70  Pulse: 68 70  Resp: 16 16  Temp: 36.7 C 36.7 C  SpO2: 95% 94%    Last Pain:  Vitals:   10/30/18 1359  TempSrc:   PainSc: 6                  Olukemi Panchal

## 2018-11-18 DIAGNOSIS — G4733 Obstructive sleep apnea (adult) (pediatric): Secondary | ICD-10-CM | POA: Diagnosis not present

## 2018-11-18 DIAGNOSIS — Z4789 Encounter for other orthopedic aftercare: Secondary | ICD-10-CM | POA: Diagnosis not present

## 2018-11-18 DIAGNOSIS — M4802 Spinal stenosis, cervical region: Secondary | ICD-10-CM | POA: Diagnosis not present

## 2018-11-18 DIAGNOSIS — I10 Essential (primary) hypertension: Secondary | ICD-10-CM | POA: Diagnosis not present

## 2018-11-18 DIAGNOSIS — M4712 Other spondylosis with myelopathy, cervical region: Secondary | ICD-10-CM | POA: Diagnosis not present

## 2018-11-19 DIAGNOSIS — M4712 Other spondylosis with myelopathy, cervical region: Secondary | ICD-10-CM | POA: Diagnosis not present

## 2018-11-19 DIAGNOSIS — M4802 Spinal stenosis, cervical region: Secondary | ICD-10-CM | POA: Diagnosis not present

## 2018-11-19 DIAGNOSIS — Z4789 Encounter for other orthopedic aftercare: Secondary | ICD-10-CM | POA: Diagnosis not present

## 2018-11-19 DIAGNOSIS — G4733 Obstructive sleep apnea (adult) (pediatric): Secondary | ICD-10-CM | POA: Diagnosis not present

## 2018-11-19 DIAGNOSIS — I10 Essential (primary) hypertension: Secondary | ICD-10-CM | POA: Diagnosis not present

## 2018-11-20 ENCOUNTER — Inpatient Hospital Stay: Payer: Medicare Other

## 2018-11-20 ENCOUNTER — Encounter: Payer: Self-pay | Admitting: Internal Medicine

## 2018-11-20 ENCOUNTER — Encounter: Payer: Self-pay | Admitting: *Deleted

## 2018-11-20 ENCOUNTER — Ambulatory Visit
Admission: RE | Admit: 2018-11-20 | Discharge: 2018-11-20 | Disposition: A | Discharge status: Home / Self Care | Payer: Medicare Other | Source: Ambulatory Visit | Attending: Radiation Oncology | Admitting: Radiation Oncology

## 2018-11-20 ENCOUNTER — Inpatient Hospital Stay: Payer: Medicare Other | Attending: Internal Medicine | Admitting: Internal Medicine

## 2018-11-20 ENCOUNTER — Other Ambulatory Visit: Payer: Self-pay | Admitting: *Deleted

## 2018-11-20 VITALS — BP 136/94 | HR 107 | Temp 98.2°F | Resp 20 | Ht 73.0 in | Wt 297.4 lb

## 2018-11-20 DIAGNOSIS — M542 Cervicalgia: Secondary | ICD-10-CM

## 2018-11-20 DIAGNOSIS — K118 Other diseases of salivary glands: Secondary | ICD-10-CM | POA: Insufficient documentation

## 2018-11-20 DIAGNOSIS — R590 Localized enlarged lymph nodes: Secondary | ICD-10-CM | POA: Diagnosis not present

## 2018-11-20 DIAGNOSIS — I10 Essential (primary) hypertension: Secondary | ICD-10-CM

## 2018-11-20 DIAGNOSIS — G44209 Tension-type headache, unspecified, not intractable: Secondary | ICD-10-CM

## 2018-11-20 DIAGNOSIS — C3411 Malignant neoplasm of upper lobe, right bronchus or lung: Secondary | ICD-10-CM

## 2018-11-20 DIAGNOSIS — R7303 Prediabetes: Secondary | ICD-10-CM

## 2018-11-20 DIAGNOSIS — M545 Low back pain: Secondary | ICD-10-CM

## 2018-11-20 DIAGNOSIS — Z87891 Personal history of nicotine dependence: Secondary | ICD-10-CM

## 2018-11-20 DIAGNOSIS — Z5111 Encounter for antineoplastic chemotherapy: Secondary | ICD-10-CM | POA: Insufficient documentation

## 2018-11-20 DIAGNOSIS — G8929 Other chronic pain: Secondary | ICD-10-CM

## 2018-11-20 DIAGNOSIS — R200 Anesthesia of skin: Secondary | ICD-10-CM | POA: Diagnosis not present

## 2018-11-20 DIAGNOSIS — R0609 Other forms of dyspnea: Secondary | ICD-10-CM

## 2018-11-20 DIAGNOSIS — C3491 Malignant neoplasm of unspecified part of right bronchus or lung: Secondary | ICD-10-CM

## 2018-11-20 DIAGNOSIS — Z7189 Other specified counseling: Secondary | ICD-10-CM | POA: Insufficient documentation

## 2018-11-20 DIAGNOSIS — R634 Abnormal weight loss: Secondary | ICD-10-CM

## 2018-11-20 DIAGNOSIS — C771 Secondary and unspecified malignant neoplasm of intrathoracic lymph nodes: Secondary | ICD-10-CM

## 2018-11-20 DIAGNOSIS — K119 Disease of salivary gland, unspecified: Secondary | ICD-10-CM | POA: Diagnosis not present

## 2018-11-20 LAB — CBC WITH DIFFERENTIAL (CANCER CENTER ONLY)
Abs Immature Granulocytes: 0.02 10*3/uL (ref 0.00–0.07)
Basophils Absolute: 0 10*3/uL (ref 0.0–0.1)
Basophils Relative: 1 %
Eosinophils Absolute: 0.4 10*3/uL (ref 0.0–0.5)
Eosinophils Relative: 5 %
HCT: 39.6 % (ref 39.0–52.0)
Hemoglobin: 12.8 g/dL — ABNORMAL LOW (ref 13.0–17.0)
Immature Granulocytes: 0 %
Lymphocytes Relative: 27 %
Lymphs Abs: 2.1 10*3/uL (ref 0.7–4.0)
MCH: 29.5 pg (ref 26.0–34.0)
MCHC: 32.3 g/dL (ref 30.0–36.0)
MCV: 91.2 fL (ref 80.0–100.0)
Monocytes Absolute: 0.7 10*3/uL (ref 0.1–1.0)
Monocytes Relative: 9 %
Neutro Abs: 4.8 10*3/uL (ref 1.7–7.7)
Neutrophils Relative %: 58 %
Platelet Count: 305 10*3/uL (ref 150–400)
RBC: 4.34 MIL/uL (ref 4.22–5.81)
RDW: 12.8 % (ref 11.5–15.5)
WBC Count: 8 10*3/uL (ref 4.0–10.5)
nRBC: 0 % (ref 0.0–0.2)

## 2018-11-20 LAB — CMP (CANCER CENTER ONLY)
ALT: 37 U/L (ref 0–44)
AST: 25 U/L (ref 15–41)
Albumin: 3.3 g/dL — ABNORMAL LOW (ref 3.5–5.0)
Alkaline Phosphatase: 110 U/L (ref 38–126)
Anion gap: 11 (ref 5–15)
BUN: 9 mg/dL (ref 8–23)
CO2: 26 mmol/L (ref 22–32)
Calcium: 9.4 mg/dL (ref 8.9–10.3)
Chloride: 104 mmol/L (ref 98–111)
Creatinine: 0.85 mg/dL (ref 0.61–1.24)
GFR, Est AFR Am: >60 mL/min (ref >60)
GFR, Estimated: >60 mL/min (ref >60)
Glucose, Bld: 108 mg/dL — ABNORMAL HIGH (ref 70–99)
Potassium: 3.9 mmol/L (ref 3.5–5.1)
Sodium: 141 mmol/L (ref 135–145)
Total Bilirubin: 0.4 mg/dL (ref 0.3–1.2)
Total Protein: 7.1 g/dL (ref 6.5–8.1)

## 2018-11-20 MED ORDER — LIDOCAINE-PRILOCAINE 2.5-2.5 % EX CREA: 1.0000 "application " | TOPICAL_CREAM | CUTANEOUS | 0 refills | Status: DC | PRN

## 2018-11-20 MED ORDER — PROCHLORPERAZINE MALEATE 10 MG PO TABS: 10.0000 mg | ORAL_TABLET | Freq: Four times a day (QID) | ORAL | 0 refills | Status: DC | PRN

## 2018-11-20 NOTE — Progress Notes (Signed)
START ON PATHWAY REGIMEN - Non-Small Cell Lung     Administer weekly:     Paclitaxel      Carboplatin   **Always confirm dose/schedule in your pharmacy ordering system**  Patient Characteristics: Stage III - Unresectable, PS = 0, 1 AJCC T Category: T2b Current Disease Status: No Distant Mets or Local Recurrence AJCC N Category: N2 AJCC M Category: M0 AJCC 8 Stage Grouping: IIIA Performance Status: PS = 0, 1 Intent of Therapy: Curative Intent, Discussed with Patient

## 2018-11-20 NOTE — Progress Notes (Signed)
[place] Clinical Social Work  Clinical Social Work met with patient/family at [employer] appointment to offer support and assess for psychosocial needs.  Patient was accompanied by his spouse and friend.  Mr. Lipsett reported his main concern at this time is dealing with the cancer.  CSW validated and normalized patient's feelings of fear and anxiety.  Patient shared many physical problems that contribute to his frustration/anxiety.  Patient denied depression and feeling hopeless during CSW visit.  CSW discussed coping skills when managing an illness and encouraged patient to follow up with CSW for counseling.   Patient lives near [place] and is concerned about transportation to appointments.  CSW discussed transportation program and [employer].   ONCBCN DISTRESS SCREENING 11/20/2018  Screening Type Initial Screening  Distress experienced in past week (1-10) 7  Emotional problem type Depression;Nervousness/Anxiety;Adjusting to illness;Feeling hopeless;Boredom  Information Concerns Type Lack of info about diagnosis;Lack of info about treatment;Lack of info about complementary therapy choices;Lack of info about maintaining fitness  Physical Problem type Pain;Nausea/vomiting;Getting around;Bathing/dressing;Breathing;Loss of appetitie;Tingling hands/feet;Skin dry/itchy;Swollen arms/legs    Clinical Social Work briefly discussed Clinical Social Work role and [employer] support programs/services.  Clinical Social Work encouraged patient to call with any additional questions or concerns.   Maryjean Morn, MSW, LCSW, OSW-C Clinical Social Worker [facility] [phone]

## 2018-11-20 NOTE — Progress Notes (Signed)
The proposed treatment discussed in cancer conference 11/20/18 is for discussion purpose only and is not a binding recommendation.  The patient was not physically examined nor present with their treatment options.  Therefore, final treatment plans cannot be decided.

## 2018-11-20 NOTE — Progress Notes (Signed)
[place] [phone]   [phone] Multidisciplinary thoracic oncology clinic  CONSULT NOTE  REFERRING PHYSICIAN: Dr. Kara Mead  REASON FOR CONSULTATION:  63 years old white male recently diagnosed with lung cancer.  HPI Alexander Hatfield. is a 63 y.o. male with past medical history significant for chronic back pain as well as back surgery, osteoarthritis, hypertension, borderline diabetes as well as history for smoking but quit 20 years ago.  The patient mentioned that he was complaining of bilateral hand and feet numbness for more than a months.  He was seen by neurosurgery for consideration of surgical intervention for degenerative disc disease and spinal stenosis.  Preoperative chest x-ray on October 16, 2018 showed new right suprahilar lung nodule concerning for malignancy.  This was followed by CT scan of the chest on October 17, 2018 and that showed a spiculated 4.0 x 3.1 cm mass/nodule most compatible with bronchogenic carcinoma in the posterior inferior right upper lobe abutting the major fissure.  There was abnormal 2.4 cm precarinal node compatible with mediastinal nodal metastasis.  No distant metastatic disease identified on the CT scan of the chest.  The patient had a PET scan on October 24, 2018 and that showed intensely hypermetabolic soft tissue nodule posterior to the left mandibular angle reflecting hypermetabolic lymph nodes or primary parotid lesion.  There was spiculated right upper lobe lung nodule and enlarged right paratracheal lymph nodes that were hypermetabolic consistent with bronchogenic carcinoma suggestive of a stage IIIA (T2b, N2, M0).  On October 27, 2018 the patient underwent anterior cervical discectomies and fusion at C3-4 and C4-5 by Dr. Saintclair Halsted.  He also underwent bronchoscopy with endobronchial ultrasound on October 29, 2018 by Dr. Ander Slade.  The final cytology from the fine-needle aspiration of the 4R lymph node  ([id number]) showed malignant cells consistent with non-small cell carcinoma favoring adenocarcinoma. The patient was referred to the multidisciplinary thoracic oncology clinic today for evaluation and recommendation regarding treatment of his condition.  When seen today he continues to complain of the neck pain as well as numbness and tingling's in his fingers.  He denied having any current chest pain but has shortness of breath increased with exertion.  He lost around 10 pounds in the last 2 months.  He denied having any nausea, vomiting, diarrhea or constipation.  He denied having any headache or visual changes. Family history significant for mother with lupus and glioblastoma, father died from lung disease. The patient is married and has 1 son.  He was accompanied today by his wife Alexander Hatfield and his friend Alexander Hatfield.  The patient lives close to [place].  He used to work as a Engineer, structural and currently retired.  He has a history of smoking up to 1.5 pack/day for around 35 years and quit 20 years ago.  He has no history of alcohol or drug abuse.  HPI  Past Medical History:  Diagnosis Date  . Arthritis   . Chronic back pain   . Chronic back pain   . Headache   . HOH (hard of hearing)   . Hypertension   . Lung mass   . Pre-diabetes   . Sleep apnea    wears cpap  . Wears glasses   . Wears partial dentures     Past Surgical History:  Procedure Laterality Date  . ANTERIOR CERVICAL DECOMP/DISCECTOMY FUSION N/A 10/27/2018   Procedure: ANTERIOR CERVICAL THREE-FOUR, FOUR-FIVE DECOMPRESSION/DISCECTOMY FUSION TWO LEVELS;  Surgeon: Kary Kos, MD;  Location: [facility]  OR;  Service: Neurosurgery;  Laterality: N/A;   ANTERIOR CERVICAL THREE-FOUR, FOUR-FIVE DECOMPRESSION/DISCECTOMY FUSION TWO LEVELS  . BACK SURGERY    . MULTIPLE TOOTH EXTRACTIONS    . SPINAL CORD STIMULATOR IMPLANT    . VIDEO BRONCHOSCOPY N/A 10/29/2018   Procedure: VIDEO BRONCHOSCOPY;  Surgeon: Laurin Coder, MD;  Location: [facility] OR;   Service: Thoracic;  Laterality: N/A;  . VIDEO BRONCHOSCOPY WITH ENDOBRONCHIAL ULTRASOUND N/A 10/27/2018   Procedure: VIDEO BRONCHOSCOPY WITH ENDOBRONCHIAL ULTRASOUND;  Surgeon: Laurin Coder, MD;  Location: [facility] OR;  Service: Thoracic;  Laterality: N/A;  . VIDEO BRONCHOSCOPY WITH ENDOBRONCHIAL ULTRASOUND N/A 10/29/2018   Procedure: VIDEO BRONCHOSCOPY WITH ENDOBRONCHIAL ULTRASOUND;  Surgeon: Laurin Coder, MD;  Location: [facility] OR;  Service: Thoracic;  Laterality: N/A;    Family History  Problem Relation Age of Onset  . Lupus Mother   . Lung disease Father     Social History Social History   Tobacco Use  . Smoking status: Former Smoker    Types: Cigarettes  . Smokeless tobacco: Never Used  . Tobacco comment: Quit smoking cigarettes 15 years ago  Substance Use Topics  . Alcohol use: No  . Drug use: No    Allergies  Allergen Reactions  . Iohexol Other (See Comments)     Desc: PT STATES THAT WHEN GIVEN THE CONTRAST HIS LEGS BURN AND HE PASSES OUT, COMA FOR 7DAY. DR Thornton Papas WOULD NOT PRE MEDICATE DUE TO SEVERITY OF REACTION, STUDY WAS CX     Current Outpatient Medications  Medication Sig Dispense Refill  . amLODipine (NORVASC) 10 MG tablet Take 10 mg by mouth daily.     . baclofen (LIORESAL) 20 MG tablet Take 20 mg by mouth 3 (three) times daily as needed for muscle spasms.     . cyclobenzaprine (FLEXERIL) 10 MG tablet Take 1 tablet (10 mg total) by mouth 3 (three) times daily as needed for muscle spasms. 30 tablet 0  . furosemide (LASIX) 20 MG tablet Take 20 mg by mouth daily.     Marland Kitchen gabapentin (NEURONTIN) 300 MG capsule Take 600 mg by mouth 3 (three) times daily.     Marland Kitchen losartan (COZAAR) 100 MG tablet Take 100 mg by mouth daily.     . meloxicam (MOBIC) 15 MG tablet Take 15 mg by mouth daily.     Marland Kitchen oxycodone (ROXICODONE) 30 MG immediate release tablet Take 30 mg by mouth every 8 (eight) hours.     Marland Kitchen oxyCODONE (ROXICODONE) 5 MG immediate release tablet Take 1 tablet (5 mg total)  by mouth every 6 (six) hours as needed. 20 tablet 0  . potassium chloride SA (K-DUR,KLOR-CON) 20 MEQ tablet Take 20 mEq by mouth every evening.     . pseudoephedrine (SUDAFED) 120 MG 12 hr tablet Take 120 mg by mouth 2 (two) times daily as needed for congestion.     No current facility-administered medications for this visit.     Review of Systems  Constitutional: positive for fatigue and weight loss Eyes: negative Ears, nose, mouth, throat, and face: negative Respiratory: positive for dyspnea on exertion Cardiovascular: negative Gastrointestinal: negative Genitourinary:negative Integument/breast: negative Hematologic/lymphatic: negative Musculoskeletal:positive for back pain and neck pain Neurological: negative Behavioral/Psych: negative Endocrine: negative Allergic/Immunologic: negative  Physical Exam  ZOX:WRUEA, healthy, no distress, well nourished and well developed SKIN: skin color, texture, turgor are normal, no rashes or significant lesions HEAD: Normocephalic, No masses, lesions, tenderness or abnormalities EYES: normal, PERRLA, Conjunctiva are pink and non-injected EARS: External ears normal,  Canals clear OROPHARYNX:no exudate, no erythema and lips, buccal mucosa, and tongue normal  NECK: supple, no adenopathy, no JVD LYMPH:  no palpable lymphadenopathy, no hepatosplenomegaly LUNGS: clear to auscultation , and palpation HEART: regular rate & rhythm, no murmurs and no gallops ABDOMEN:abdomen soft, non-tender, normal bowel sounds and no masses or organomegaly BACK: Back symmetric, no curvature., No CVA tenderness EXTREMITIES:no joint deformities, effusion, or inflammation, no edema  NEURO: alert & oriented x 3 with fluent speech, no focal motor/sensory deficits  PERFORMANCE STATUS: ECOG 1  LABORATORY DATA: Lab Results  Component Value Date   WBC 8.0 11/20/2018   HGB 12.8 (L) 11/20/2018   HCT 39.6 11/20/2018   MCV 91.2 11/20/2018   PLT 305 11/20/2018       Chemistry      Component Value Date/Time   NA 141 10/27/2018 1205   K 3.9 10/27/2018 1205   CL 106 10/27/2018 1205   CO2 25 10/27/2018 1205   BUN 10 10/27/2018 1205   CREATININE 0.90 10/27/2018 1205      Component Value Date/Time   CALCIUM 9.2 10/27/2018 1205   ALKPHOS 85 10/27/2018 1205   AST 17 10/27/2018 1205   ALT 10 10/27/2018 1205   BILITOT 0.5 10/27/2018 1205       RADIOGRAPHIC STUDIES: Dg Cervical Spine 2-3 Views  Result Date: 10/27/2018 CLINICAL DATA:  63 year old male for C3-C5 fusion. Subsequent encounter. EXAM: DG C-ARM 61-120 MIN; CERVICAL SPINE - 2-3 VIEW Fluoroscopic time: 8 seconds. COMPARISON:  10/06/2018 CT. FINDINGS: Two intraoperative C-arm lateral views of the cervical spine submitted for review after surgery. C3-4 and C4-5 fusion with anterior plate, screws and interbody spacers. The C5 screws are slightly poorly delineated secondary to overlying shoulders. This can be assessed on follow-up. Patient is intubated. Sponge may be in place. IMPRESSION: Fusion C3-C5 as noted above. Electronically Signed   By: Genia Del M.D.   On: 10/27/2018 17:50   Nm Pet Image Initial (pi) Skull Base To Thigh  Result Date: 10/24/2018 CLINICAL DATA:  Initial treatment strategy for spiculated right upper lobe mass, presumed bronchogenic carcinoma. EXAM: NUCLEAR MEDICINE PET SKULL BASE TO THIGH TECHNIQUE: 14.1 mCi F-18 FDG was injected intravenously. Full-ring PET imaging was performed from the skull base to thigh after the radiotracer. CT data was obtained and used for attenuation correction and anatomic localization. Fasting blood glucose: 88 mg/dl COMPARISON:  Chest CT 10/17/2018.  Cervical spine CT 10/06/2018. FINDINGS: Mediastinal blood pool activity: SUV max 2.9 NECK: There is an intensely hypermetabolic nodule posterior to the left mandibular angle, demonstrating an SUV max of 21.9. This corresponds with a 9 mm soft tissue nodule on image 25/4. This could reflect a lymph node or  deep parotid lesion. No other hypermetabolic cervical lymph nodes are identified.There are no lesions of the pharyngeal mucosal space. Incidental CT findings: Bilateral carotid atherosclerosis. There is asymmetric enlargement of the right thyroid lobe without hypermetabolic activity. CHEST: The spiculated mass posteriorly in the right upper lobe is hypermetabolic with an SUV max of 10.3. This mass measures 3.6 x 4.1 cm on image 30/8. Enlarged right paratracheal node measuring 2.3 cm short axis on image 68/4 is also hypermetabolic with an SUV max of 11.3. No other hypermetabolic thoracic adenopathy or suspicious pulmonary nodules. Incidental CT findings: Atherosclerosis of the aorta, great vessels and coronary arteries. Mild emphysema. ABDOMEN/PELVIS: There is no hypermetabolic activity within the liver, adrenal glands, spleen or pancreas. There is no hypermetabolic nodal activity. Incidental CT findings: Aortic and branch vessel  atherosclerosis. Mild hepatic steatosis. SKELETON: There is no hypermetabolic activity to suggest osseous metastatic disease. Incidental CT findings: There is hypermetabolic activity associated with the left common hamstring tendon consistent with tendinosis or partial tearing. There are postsurgical changes in the lower lumbar spine. Patient has a thoracic spinal stimulator and mild bilateral gynecomastia. IMPRESSION: 1. Intensely hypermetabolic soft tissue nodule posterior to the left mandibular angle could reflect a hypermetabolic lymph node or a primary parotid lesion. No lesion of the pharyngeal mucosal space identified. ENT evaluation recommended. This is not likely metastatic from the presumed right upper lobe bronchogenic carcinoma. 2. Spiculated right upper lobe lung mass and enlarged right paratracheal lymph node are hypermetabolic, consistent with bronchogenic carcinoma. Presuming the left neck nodule is unrelated, this represents stage IIIA disease (T2b N2 M0). Electronically  Signed   By: Richardean Sale M.D.   On: 10/24/2018 14:08   Dg Chest Port 1 View  Result Date: 10/29/2018 CLINICAL DATA:  Status post bronchoscopy. EXAM: PORTABLE CHEST 1 VIEW COMPARISON:  Radiographs of October 16, 2018. FINDINGS: Stable cardiomediastinal silhouette is noted. Atherosclerosis of thoracic aorta is noted. No pneumothorax is noted. Right suprahilar mass is again noted concerning for malignancy. Increased right upper lobe airspace opacity is noted most consistent with atelectasis. Bony thorax is unremarkable. IMPRESSION: No pneumothorax status post bronchoscopy. New right upper lobe airspace opacity is noted most consistent with atelectasis. Stable right suprahilar mass is noted. Electronically Signed   By: Marijo Conception, M.D.   On: 10/29/2018 13:07   Dg C-arm 1-60 Min  Result Date: 10/27/2018 CLINICAL DATA:  63 year old male for C3-C5 fusion. Subsequent encounter. EXAM: DG C-ARM 61-120 MIN; CERVICAL SPINE - 2-3 VIEW Fluoroscopic time: 8 seconds. COMPARISON:  10/06/2018 CT. FINDINGS: Two intraoperative C-arm lateral views of the cervical spine submitted for review after surgery. C3-4 and C4-5 fusion with anterior plate, screws and interbody spacers. The C5 screws are slightly poorly delineated secondary to overlying shoulders. This can be assessed on follow-up. Patient is intubated. Sponge may be in place. IMPRESSION: Fusion C3-C5 as noted above. Electronically Signed   By: Genia Del M.D.   On: 10/27/2018 17:50   Dg C-arm Bronchoscopy  Result Date: 10/29/2018 C-ARM BRONCHOSCOPY: Fluoroscopy was utilized by the requesting physician.  No radiographic interpretation.    ASSESSMENT: This is a very pleasant 63 years old white male recently diagnosed with a stage IIIa (T2b, N2, M0) non-small cell lung cancer, squamous cell carcinoma presented with right upper lobe lung mass in addition to right paratracheal lymphadenopathy diagnosed in December 2019.  The patient also has a  hypermetabolic lesion in the left parotid gland that need further evaluation.   PLAN: I had a lengthy discussion with the patient and his family today about his current disease stage, prognosis and treatment options. I personally and independently reviewed the scan images and discussed the result and showed the images to the patient and his family. I recommended for the patient to complete the staging work-up by ordering CT scan of the head with and without contrast to rule out brain metastasis.  The patient cannot tolerate MRI because of inserted pain stimulator. I discussed with the patient his treatment options and recommended for him a course of concurrent chemoradiation with weekly carboplatin for AUC of 2 and paclitaxel 45 mg/M2 followed by consolidation immunotherapy if he has no concerning findings for disease progression. I discussed with the patient the adverse effect of the chemotherapy including but not limited to alopecia, myelosuppression, nausea and  vomiting, peripheral neuropathy, liver or renal dysfunction. The patient mentions that he lives close to [place] and he would prefer to receive his radiation close to home because of the transportation issues.  He still interested in coming to [facility] for his chemotherapy.  I also gave him the option of receiving treatment at [facility] but he preferred to come to [facility] for the chemotherapy portion. He is expected to start the first dose of this treatment on December 01, 2018. The patient was seen today by Dr. Sondra Come from radiation oncology and he will arrange for the patient to receive his radiotherapy in [facility]. I will also arrange for the patient to have a Port-A-Cath placed for chemotherapy infusion. I will call his pharmacy with prescription for Compazine 10 mg p.o. every 6 hours as needed for nausea in addition to EMLA cream to be applied to the Port-A-Cath site. For the left parotid gland hypermetabolic nodule,  I will refer the patient to interventional radiology for consideration of ultrasound-guided fine-needle aspiration of this nodule. The patient will come back for follow-up visit in 2 weeks for evaluation and management of any adverse effect of his treatment. He was advised to call immediately if he has any concerning symptoms in the interval.  The patient voices understanding of current disease status and treatment options and is in agreement with the current care plan.  All questions were answered. The patient knows to call the clinic with any problems, questions or concerns. We can certainly see the patient much sooner if necessary.  Thank you so much for allowing me to participate in the care of Alexander Hatfield.. I will continue to follow up the patient with you and assist in his care.  I spent 55 minutes counseling the patient face to face. The total time spent in the appointment was 80 minutes.  Disclaimer: This note was dictated with voice recognition software. Similar sounding words can inadvertently be transcribed and may not be corrected upon review.   Eilleen Kempf November 20, 2018, 2:19 PM

## 2018-11-20 NOTE — Progress Notes (Signed)
Radiation Oncology         [phone] ________________________________  Multidisciplinary Thoracic Oncology Clinic [facility]) Initial Outpatient Consultation  Name: Alexander Hatfield. MRN: [id number]  Date: 11/20/2018  DOB: 01/15/1956  [id number], Percell Miller, MD  Laurin Coder, MD   REFERRING PHYSICIAN: Laurin Coder, MD  DIAGNOSIS: The encounter diagnosis was Adenocarcinoma of right lung, stage 3 ([place]).    ICD-10-CM   1. Adenocarcinoma of right lung, stage 3 (HCC) C34.91      Stage IIIa non small cell lung cancer, favoring adenocarcinoma  HISTORY OF PRESENT ILLNESS::Alexander C Madyx Delfin. is a 63 y.o. male who has been disabled since the late 1980s due to chronic back pain. He has had several back surgeries and required several spinal stimulators. He was found to have cervical disc disease and planned to have cervical fusion surgery with Dr. Carloyn Manner at [facility]. A preop chest x-ray incidentally identified a right upper lobe lung mass. The patient subsequently underwent a CT scan of the chest on 10/17/2018. This showed a 40 x 30 mm angulated right upper lobe lung mass associated with a 24 mm precarinal lymph node.   PET scan on 10/24/2018 showed an intensely hypermetabolic soft tissue nodule posterior to the left mandibular angle, could reflect a hypermetabolic lymph node or primary parotid lesion, not likely metastatic from the right upper lobe bronchogenic carcinoma. There was a spiculated right upper lobe lung mass and enlarged right paratracheal lymph node consistent with bronchogenic carcinoma. Presuming the left neck nodule is unrelated, this represents stage IIIA disease (T2bN2M0).  His cervical fusion surgery was performed by Dr. Saintclair Halsted on 10/27/2018. He then underwent bronchoscopy on 10/29/2018. Biopsy of the right upper lobe was negative for malignancy. Biopsy of the 4R node revealed malignant cells consistent with non small cell carcinoma, favor adenocarcinoma.   The patient was  referred today for presentation in the multidisciplinary conference.  Radiology studies and pathology slides were presented there for review and discussion of treatment options.  A consensus was discussed regarding potential next steps.  On review of systems, the patient reports left-sided weakness. He reports numbness in both hands. This has occurred since his recent neck surgery, hopefully to improve with time  PREVIOUS RADIATION THERAPY: No  PAST MEDICAL HISTORY:  has a past medical history of Arthritis, Chronic back pain, Chronic back pain, Headache, HOH (hard of hearing), Hypertension, Lung mass, Pre-diabetes, Sleep apnea, Wears glasses, and Wears partial dentures.    PAST SURGICAL HISTORY: Past Surgical History:  Procedure Laterality Date  . ANTERIOR CERVICAL DECOMP/DISCECTOMY FUSION N/A 10/27/2018   Procedure: ANTERIOR CERVICAL THREE-FOUR, FOUR-FIVE DECOMPRESSION/DISCECTOMY FUSION TWO LEVELS;  Surgeon: Kary Kos, MD;  Location: [place];  Service: Neurosurgery;  Laterality: N/A;   ANTERIOR CERVICAL THREE-FOUR, FOUR-FIVE DECOMPRESSION/DISCECTOMY FUSION TWO LEVELS  . BACK SURGERY    . MULTIPLE TOOTH EXTRACTIONS    . SPINAL CORD STIMULATOR IMPLANT    . VIDEO BRONCHOSCOPY N/A 10/29/2018   Procedure: VIDEO BRONCHOSCOPY;  Surgeon: Laurin Coder, MD;  Location: [facility];  Service: Thoracic;  Laterality: N/A;  . VIDEO BRONCHOSCOPY WITH ENDOBRONCHIAL ULTRASOUND N/A 10/27/2018   Procedure: VIDEO BRONCHOSCOPY WITH ENDOBRONCHIAL ULTRASOUND;  Surgeon: Laurin Coder, MD;  Location: [place];  Service: Thoracic;  Laterality: N/A;  . VIDEO BRONCHOSCOPY WITH ENDOBRONCHIAL ULTRASOUND N/A 10/29/2018   Procedure: VIDEO BRONCHOSCOPY WITH ENDOBRONCHIAL ULTRASOUND;  Surgeon: Laurin Coder, MD;  Location: [place];  Service: Thoracic;  Laterality: N/A;    FAMILY HISTORY: family history includes  Lung disease in his father; Lupus in his mother.  SOCIAL HISTORY:  reports that he has quit smoking. His  smoking use included cigarettes. He has never used smokeless tobacco. He reports that he does not drink alcohol or use drugs.  ALLERGIES: Iohexol  MEDICATIONS:  Current Outpatient Medications  Medication Sig Dispense Refill  . amLODipine (NORVASC) 10 MG tablet Take 10 mg by mouth daily.     . baclofen (LIORESAL) 20 MG tablet Take 20 mg by mouth 3 (three) times daily as needed for muscle spasms.     . cyclobenzaprine (FLEXERIL) 10 MG tablet Take 1 tablet (10 mg total) by mouth 3 (three) times daily as needed for muscle spasms. 30 tablet 0  . furosemide (LASIX) 20 MG tablet Take 20 mg by mouth daily.     Marland Kitchen gabapentin (NEURONTIN) 300 MG capsule Take 600 mg by mouth 3 (three) times daily.     Marland Kitchen losartan (COZAAR) 100 MG tablet Take 100 mg by mouth daily.     . meloxicam (MOBIC) 15 MG tablet Take 15 mg by mouth daily.     Marland Kitchen oxycodone (ROXICODONE) 30 MG immediate release tablet Take 30 mg by mouth every 8 (eight) hours.     Marland Kitchen oxyCODONE (ROXICODONE) 5 MG immediate release tablet Take 1 tablet (5 mg total) by mouth every 6 (six) hours as needed. 20 tablet 0  . potassium chloride SA (K-DUR,KLOR-CON) 20 MEQ tablet Take 20 mEq by mouth every evening.     . prochlorperazine (COMPAZINE) 10 MG tablet Take 1 tablet (10 mg total) by mouth every 6 (six) hours as needed for nausea or vomiting. 30 tablet 0  . pseudoephedrine (SUDAFED) 120 MG 12 hr tablet Take 120 mg by mouth 2 (two) times daily as needed for congestion.     No current facility-administered medications for this encounter.     REVIEW OF SYSTEMS:  REVIEW OF SYSTEMS: A 10+ POINT REVIEW OF SYSTEMS WAS OBTAINED including neurology, dermatology, psychiatry, cardiac, respiratory, lymph, extremities, GI, GU, musculoskeletal, constitutional, reproductive, HEENT. All pertinent positives are noted in the HPI. All others are negative.   PHYSICAL EXAM:  Vitals with BMI 11/20/2018  Height 6\' 1"   Weight 297 lbs 6 oz  BMI 92.42  Systolic 683  Diastolic 94    Pulse 419  Respirations 20   General: Alert and oriented, in no acute distress. HEENT: Head is normocephalic. Extraocular movements are intact. Oropharynx is clear, top partials in place. Neck: Neck is supple, no palpable cervical or supraclavicular lymphadenopathy. Heart: Regular in rate and rhythm with no murmurs, rubs, or gallops. Chest: Clear to auscultation bilaterally, with no rhonchi, wheezes, or rales. Abdomen: Soft, nontender, nondistended, with no rigidity or guarding. Extremities: No cyanosis or edema. Lymphatics: see Neck Exam Skin: No concerning lesions. Has scars along his neck, mid back, and lower back from prior spine surgeries. Musculoskeletal: Ambulates with walker. Patient has weakness in his left lower extremity, left arm, and hand. Neurologic: Cranial nerves II through XII are grossly intact. No obvious focalities. Speech is fluent. Coordination is intact. Psychiatric: Judgment and insight are intact. Affect is appropriate.   KPS = 70  100 - Normal; no complaints; no evidence of disease. 90   - Able to carry on normal activity; minor signs or symptoms of disease. 80   - Normal activity with effort; some signs or symptoms of disease. 32   - Cares for self; unable to carry on normal activity or to do active work. 60   -  Requires occasional assistance, but is able to care for most of his personal needs. 50   - Requires considerable assistance and frequent medical care. 88   - Disabled; requires special care and assistance. 74   - Severely disabled; hospital admission is indicated although death not imminent. 42   - Very sick; hospital admission necessary; active supportive treatment necessary. 10   - Moribund; fatal processes progressing rapidly. 0     - Dead  Karnofsky DA, Abelmann [place], Craver LS and Burchenal JH [phone]) The use of the nitrogen mustards in the palliative treatment of carcinoma: with particular reference to bronchogenic carcinoma Cancer 1  [id number]  LABORATORY DATA:  Lab Results  Component Value Date   WBC 8.0 11/20/2018   HGB 12.8 (L) 11/20/2018   HCT 39.6 11/20/2018   MCV 91.2 11/20/2018   PLT 305 11/20/2018   Lab Results  Component Value Date   NA 141 11/20/2018   K 3.9 11/20/2018   CL 104 11/20/2018   CO2 26 11/20/2018   Lab Results  Component Value Date   ALT 37 11/20/2018   AST 25 11/20/2018   ALKPHOS 110 11/20/2018   BILITOT 0.4 11/20/2018    PULMONARY FUNCTION TEST:   Recent Review Flowsheet Data    There is no flowsheet data to display.      RADIOGRAPHY: Dg Cervical Spine 2-3 Views  Result Date: 10/27/2018 CLINICAL DATA:  63 year old male for C3-C5 fusion. Subsequent encounter. EXAM: DG C-ARM 61-120 MIN; CERVICAL SPINE - 2-3 VIEW Fluoroscopic time: 8 seconds. COMPARISON:  10/06/2018 CT. FINDINGS: Two intraoperative C-arm lateral views of the cervical spine submitted for review after surgery. C3-4 and C4-5 fusion with anterior plate, screws and interbody spacers. The C5 screws are slightly poorly delineated secondary to overlying shoulders. This can be assessed on follow-up. Patient is intubated. Sponge may be in place. IMPRESSION: Fusion C3-C5 as noted above. Electronically Signed   By: Genia Del M.D.   On: 10/27/2018 17:50   Nm Pet Image Initial (pi) Skull Base To Thigh  Result Date: 10/24/2018 CLINICAL DATA:  Initial treatment strategy for spiculated right upper lobe mass, presumed bronchogenic carcinoma. EXAM: NUCLEAR MEDICINE PET SKULL BASE TO THIGH TECHNIQUE: 14.1 mCi F-18 FDG was injected intravenously. Full-ring PET imaging was performed from the skull base to thigh after the radiotracer. CT data was obtained and used for attenuation correction and anatomic localization. Fasting blood glucose: 88 mg/dl COMPARISON:  Chest CT 10/17/2018.  Cervical spine CT 10/06/2018. FINDINGS: Mediastinal blood pool activity: SUV max 2.9 NECK: There is an intensely hypermetabolic nodule posterior to the left  mandibular angle, demonstrating an SUV max of 21.9. This corresponds with a 9 mm soft tissue nodule on image 25/4. This could reflect a lymph node or deep parotid lesion. No other hypermetabolic cervical lymph nodes are identified.There are no lesions of the pharyngeal mucosal space. Incidental CT findings: Bilateral carotid atherosclerosis. There is asymmetric enlargement of the right thyroid lobe without hypermetabolic activity. CHEST: The spiculated mass posteriorly in the right upper lobe is hypermetabolic with an SUV max of 10.3. This mass measures 3.6 x 4.1 cm on image 30/8. Enlarged right paratracheal node measuring 2.3 cm short axis on image 68/4 is also hypermetabolic with an SUV max of 11.3. No other hypermetabolic thoracic adenopathy or suspicious pulmonary nodules. Incidental CT findings: Atherosclerosis of the aorta, great vessels and coronary arteries. Mild emphysema. ABDOMEN/PELVIS: There is no hypermetabolic activity within the liver, adrenal glands, spleen or pancreas. There is no hypermetabolic  nodal activity. Incidental CT findings: Aortic and branch vessel atherosclerosis. Mild hepatic steatosis. SKELETON: There is no hypermetabolic activity to suggest osseous metastatic disease. Incidental CT findings: There is hypermetabolic activity associated with the left common hamstring tendon consistent with tendinosis or partial tearing. There are postsurgical changes in the lower lumbar spine. Patient has a thoracic spinal stimulator and mild bilateral gynecomastia. IMPRESSION: 1. Intensely hypermetabolic soft tissue nodule posterior to the left mandibular angle could reflect a hypermetabolic lymph node or a primary parotid lesion. No lesion of the pharyngeal mucosal space identified. ENT evaluation recommended. This is not likely metastatic from the presumed right upper lobe bronchogenic carcinoma. 2. Spiculated right upper lobe lung mass and enlarged right paratracheal lymph node are hypermetabolic,  consistent with bronchogenic carcinoma. Presuming the left neck nodule is unrelated, this represents stage IIIA disease (T2b N2 M0). Electronically Signed   By: Richardean Sale M.D.   On: 10/24/2018 14:08   Dg Chest Port 1 View  Result Date: 10/29/2018 CLINICAL DATA:  Status post bronchoscopy. EXAM: PORTABLE CHEST 1 VIEW COMPARISON:  Radiographs of October 16, 2018. FINDINGS: Stable cardiomediastinal silhouette is noted. Atherosclerosis of thoracic aorta is noted. No pneumothorax is noted. Right suprahilar mass is again noted concerning for malignancy. Increased right upper lobe airspace opacity is noted most consistent with atelectasis. Bony thorax is unremarkable. IMPRESSION: No pneumothorax status post bronchoscopy. New right upper lobe airspace opacity is noted most consistent with atelectasis. Stable right suprahilar mass is noted. Electronically Signed   By: Marijo Conception, M.D.   On: 10/29/2018 13:07   Dg C-arm 1-60 Min  Result Date: 10/27/2018 CLINICAL DATA:  63 year old male for C3-C5 fusion. Subsequent encounter. EXAM: DG C-ARM 61-120 MIN; CERVICAL SPINE - 2-3 VIEW Fluoroscopic time: 8 seconds. COMPARISON:  10/06/2018 CT. FINDINGS: Two intraoperative C-arm lateral views of the cervical spine submitted for review after surgery. C3-4 and C4-5 fusion with anterior plate, screws and interbody spacers. The C5 screws are slightly poorly delineated secondary to overlying shoulders. This can be assessed on follow-up. Patient is intubated. Sponge may be in place. IMPRESSION: Fusion C3-C5 as noted above. Electronically Signed   By: Genia Del M.D.   On: 10/27/2018 17:50   Dg C-arm Bronchoscopy  Result Date: 10/29/2018 C-ARM BRONCHOSCOPY: Fluoroscopy was utilized by the requesting physician.  No radiographic interpretation.      IMPRESSION: Stage IIIa non small cell lung cancer, favoring adenocarcinoma. Patient would be a good candidate for a definitive course of radiation along with  radiosensitizing chemotherapy. Given the fact that the patient is not able to drive at this time due to his recent cervical spine surgery and associated left-sided weakness, he would prefer to have his radiation therapy at [facility] in [place] which is close to his home. Patient will proceed with head CT scan to complete his staging workup.   Today, I talked to the patient, his wife, and their driver, Yvone Neu, about the findings and work-up thus far.  We discussed the natural history of NSCLC and general treatment, highlighting the role of radiotherapy in the management.  We discussed the available radiation techniques, and focused on the details of logistics and delivery.  We reviewed the anticipated acute and late sequelae associated with radiation in this setting.  The patient was encouraged to ask questions that I answered to the best of my ability.   PLAN: A referral is being made for radiation consultation and treatment in [place]. The patient will tentatively start his combined treatments on  January 20th or 21st. Anticipate apprixmately 6 weeks of radiation therapy. He has also been scheduled for ultrasound-guided FNA biopsy of the suspicious left parotid nodule.     ------------------------------------------------  Blair Promise, PhD, MD  This document serves as a record of services personally performed by Gery Pray, MD. It was created on his behalf by Rae Lips, a trained medical scribe. The creation of this record is based on the scribe's personal observations and the provider's statements to them. This document has been checked and approved by the attending provider.

## 2018-11-21 ENCOUNTER — Telehealth: Payer: Self-pay

## 2018-11-21 ENCOUNTER — Other Ambulatory Visit: Payer: Self-pay | Admitting: *Deleted

## 2018-11-21 ENCOUNTER — Telehealth: Payer: Self-pay | Admitting: *Deleted

## 2018-11-21 ENCOUNTER — Inpatient Hospital Stay: Payer: Medicare Other

## 2018-11-21 ENCOUNTER — Other Ambulatory Visit: Payer: Self-pay | Admitting: Internal Medicine

## 2018-11-21 ENCOUNTER — Encounter: Payer: Self-pay | Admitting: *Deleted

## 2018-11-21 DIAGNOSIS — M4712 Other spondylosis with myelopathy, cervical region: Secondary | ICD-10-CM | POA: Diagnosis not present

## 2018-11-21 DIAGNOSIS — Z4789 Encounter for other orthopedic aftercare: Secondary | ICD-10-CM | POA: Diagnosis not present

## 2018-11-21 DIAGNOSIS — I10 Essential (primary) hypertension: Secondary | ICD-10-CM | POA: Diagnosis not present

## 2018-11-21 DIAGNOSIS — G4733 Obstructive sleep apnea (adult) (pediatric): Secondary | ICD-10-CM | POA: Diagnosis not present

## 2018-11-21 DIAGNOSIS — C3491 Malignant neoplasm of unspecified part of right bronchus or lung: Secondary | ICD-10-CM

## 2018-11-21 DIAGNOSIS — M4802 Spinal stenosis, cervical region: Secondary | ICD-10-CM | POA: Diagnosis not present

## 2018-11-21 NOTE — Progress Notes (Signed)
Oncology Nurse Navigator Documentation  Oncology Nurse Navigator Flowsheets 11/21/2018  Navigator Location [facility]  Navigator Encounter Type Clinic/MDC;Other/I spoke with patient and family yesterday at thoracic clinic.  I gave and explained information on lung cancer and treatment plan.  Today, I completed referral to [facility].  I faxed  records to them to help coordinate his radiation treatment.  I also called and left a message for [place] to call me with an update on his referral.   Abnormal Finding Date 10/07/2018  Confirmed Diagnosis Date 10/29/2018  Multidisiplinary Clinic Date 11/20/2018  Treatment Initiated Date 12/01/2018  Patient Visit Type MedOnc  Treatment Phase Pre-Tx/Tx Discussion  Barriers/Navigation Needs Education;Coordination of Care  Education Newly Diagnosed Cancer Education  Interventions Coordination of Care;Education  Coordination of Care Other  Education Method Verbal;Written  Support Groups/Services Other  Acuity Level 2  Time Spent with Patient 15

## 2018-11-21 NOTE — Telephone Encounter (Signed)
Printed avs and calender of upcoming appointment. Per 1/9 los. Added d1, and d2 treatment to infusion book

## 2018-11-21 NOTE — Telephone Encounter (Signed)
Received VM message from [place] at [facility]. She is requesting order change for pt's CT scan of head. To without contrast only. Pt is highly allergic to contrast dye. Order changed to without contrast.

## 2018-11-23 ENCOUNTER — Encounter: Payer: Self-pay | Admitting: Nurse Practitioner

## 2018-11-24 ENCOUNTER — Other Ambulatory Visit: Payer: Self-pay | Admitting: Internal Medicine

## 2018-11-24 ENCOUNTER — Telehealth: Payer: Self-pay | Admitting: *Deleted

## 2018-11-24 ENCOUNTER — Encounter: Payer: Self-pay | Admitting: *Deleted

## 2018-11-24 ENCOUNTER — Telehealth: Payer: Self-pay

## 2018-11-24 DIAGNOSIS — C3491 Malignant neoplasm of unspecified part of right bronchus or lung: Secondary | ICD-10-CM

## 2018-11-24 DIAGNOSIS — Z510 Encounter for antineoplastic radiation therapy: Secondary | ICD-10-CM | POA: Diagnosis not present

## 2018-11-24 DIAGNOSIS — C3411 Malignant neoplasm of upper lobe, right bronchus or lung: Secondary | ICD-10-CM | POA: Diagnosis not present

## 2018-11-24 NOTE — Telephone Encounter (Signed)
Per 1/13 follow up left a detailed msg for the patient concerning his newly added upcoming appointment. Will mail a letter with a calender enclosed

## 2018-11-24 NOTE — Telephone Encounter (Signed)
Oncology Nurse Navigator Documentation  Oncology Nurse Navigator Flowsheets 11/24/2018  Navigator Location [facility]  Navigator Encounter Type Telephone/I received a vm message from Alexander Hatfield wife.  She would like her husband to get his US biopsy the same day as his port placement.  I updated her that I have already reached out to the radiology manager to see if this could be done.  Once I hear I will update.   Telephone Outgoing Call  Treatment Phase Pre-Tx/Tx Discussion  Barriers/Navigation Needs Education  Education Other  Interventions Education  Education Method Verbal  Acuity Level 1  Time Spent with Patient 15

## 2018-11-24 NOTE — Telephone Encounter (Signed)
Oncology Nurse Navigator Documentation  Oncology Nurse Navigator Flowsheets 11/24/2018  Navigator Location [facility]  Navigator Encounter Type Telephone/I called [facility] to update them on US biopsy is on 12/01/18, there were no earlier appts.  I updated Rad Onc and then called patient's wife.  I updated her that she will get a call regarding the biopsy and the patient's scheduled. She verbalized understanding. I will update Dr. Julien Nordmann.   Telephone Outgoing Call  Treatment Phase Pre-Tx/Tx Discussion  Barriers/Navigation Needs Education;Coordination of Care  Education Other  Interventions Coordination of Care;Education  Coordination of Care Other  Education Method Verbal  Acuity Level 3  Time Spent with Patient 30

## 2018-11-24 NOTE — Progress Notes (Signed)
Oncology Nurse Navigator Documentation  Oncology Nurse Navigator Flowsheets 11/24/2018  Navigator Location [facility]  Navigator Encounter Type Other/pateint will be getting radiation tx with [facility] rad onc.  I have been working with them to coordinate his care.   Patient needs lymph node biopsy and this has not been scheduled yet.  I contacted manager with radiology dept to see if this can be scheduled when patient has his port placed. Will wait to hear from her.   Treatment Phase Pre-Tx/Tx Discussion  Barriers/Navigation Needs Coordination of Care  Interventions Coordination of Care  Coordination of Care Other  Acuity Level 2  Time Spent with Patient 30

## 2018-11-25 ENCOUNTER — Ambulatory Visit (HOSPITAL_COMMUNITY): Payer: Medicare Other

## 2018-11-25 ENCOUNTER — Telehealth: Payer: Self-pay | Admitting: *Deleted

## 2018-11-25 NOTE — Telephone Encounter (Signed)
Oncology Nurse Navigator Documentation  Oncology Nurse Navigator Flowsheets 11/25/2018  Navigator Location [facility]  Navigator Encounter Type Telephone;Letter/Fax/Email/I received a call from [place] updating me that patient wife was confused about appts and when is the biopsy.  I contacted radiology dept manager and updated her that the patient as not been called with an update on biopsy.  I then called patient and updated her on patient's upcoming appts.  I clarified time and location of appts.  She was thankful for the help.   Telephone Outgoing Call  Treatment Phase Pre-Tx/Tx Discussion  Barriers/Navigation Needs Education;Coordination of Care  Education Other  Interventions Coordination of Care;Education  Coordination of Care Other  Education Method Verbal  Acuity Level 3  Time Spent with Patient 66

## 2018-11-26 ENCOUNTER — Other Ambulatory Visit: Payer: Self-pay | Admitting: Radiology

## 2018-11-26 ENCOUNTER — Encounter: Payer: Self-pay | Admitting: *Deleted

## 2018-11-26 ENCOUNTER — Ambulatory Visit (HOSPITAL_COMMUNITY)
Admission: RE | Admit: 2018-11-26 | Discharge: 2018-11-26 | Disposition: A | Discharge status: Home / Self Care | Payer: Medicare Other | Source: Ambulatory Visit | Attending: Internal Medicine | Admitting: Internal Medicine

## 2018-11-26 DIAGNOSIS — C3491 Malignant neoplasm of unspecified part of right bronchus or lung: Secondary | ICD-10-CM | POA: Diagnosis not present

## 2018-11-26 DIAGNOSIS — C3431 Malignant neoplasm of lower lobe, right bronchus or lung: Secondary | ICD-10-CM | POA: Diagnosis not present

## 2018-11-26 DIAGNOSIS — C3411 Malignant neoplasm of upper lobe, right bronchus or lung: Secondary | ICD-10-CM | POA: Diagnosis not present

## 2018-11-26 DIAGNOSIS — Z510 Encounter for antineoplastic radiation therapy: Secondary | ICD-10-CM | POA: Diagnosis not present

## 2018-11-26 NOTE — Progress Notes (Signed)
Oncology Nurse Navigator Documentation  Oncology Nurse Navigator Flowsheets 11/26/2018  Navigator Location [facility]  Navigator Encounter Type Other/I faxed [facility] patient's chemo schedule.    Treatment Phase Pre-Tx/Tx Discussion  Barriers/Navigation Needs Coordination of Care  Interventions Coordination of Care  Coordination of Care Other  Acuity Level 2  Time Spent with Patient 15

## 2018-11-27 ENCOUNTER — Encounter (HOSPITAL_COMMUNITY): Payer: Self-pay

## 2018-11-27 ENCOUNTER — Ambulatory Visit (HOSPITAL_COMMUNITY)
Admission: RE | Admit: 2018-11-27 | Discharge: 2018-11-27 | Disposition: A | Discharge status: Home / Self Care | Payer: Medicare Other | Source: Ambulatory Visit | Attending: Internal Medicine | Admitting: Internal Medicine

## 2018-11-27 ENCOUNTER — Other Ambulatory Visit: Payer: Self-pay | Admitting: Internal Medicine

## 2018-11-27 ENCOUNTER — Other Ambulatory Visit: Payer: Self-pay

## 2018-11-27 ENCOUNTER — Encounter: Payer: Self-pay | Admitting: *Deleted

## 2018-11-27 DIAGNOSIS — C3411 Malignant neoplasm of upper lobe, right bronchus or lung: Secondary | ICD-10-CM | POA: Diagnosis not present

## 2018-11-27 DIAGNOSIS — G44209 Tension-type headache, unspecified, not intractable: Secondary | ICD-10-CM | POA: Insufficient documentation

## 2018-11-27 DIAGNOSIS — G4730 Sleep apnea, unspecified: Secondary | ICD-10-CM | POA: Insufficient documentation

## 2018-11-27 DIAGNOSIS — Z87891 Personal history of nicotine dependence: Secondary | ICD-10-CM | POA: Insufficient documentation

## 2018-11-27 DIAGNOSIS — I10 Essential (primary) hypertension: Secondary | ICD-10-CM | POA: Diagnosis not present

## 2018-11-27 DIAGNOSIS — Z79899 Other long term (current) drug therapy: Secondary | ICD-10-CM | POA: Diagnosis not present

## 2018-11-27 DIAGNOSIS — Z5111 Encounter for antineoplastic chemotherapy: Secondary | ICD-10-CM | POA: Diagnosis not present

## 2018-11-27 DIAGNOSIS — C3490 Malignant neoplasm of unspecified part of unspecified bronchus or lung: Secondary | ICD-10-CM | POA: Diagnosis not present

## 2018-11-27 HISTORY — PX: IR IMAGING GUIDED PORT INSERTION: IMG5740

## 2018-11-27 LAB — GLUCOSE, CAPILLARY: Glucose-Capillary: 78 mg/dL (ref 70–99)

## 2018-11-27 LAB — PROTIME-INR
INR: 0.94
Prothrombin Time: 12.4 seconds (ref 11.4–15.2)

## 2018-11-27 LAB — CBC
HCT: 42.9 % (ref 39.0–52.0)
Hemoglobin: 13.2 g/dL (ref 13.0–17.0)
MCH: 29.3 pg (ref 26.0–34.0)
MCHC: 30.8 g/dL (ref 30.0–36.0)
MCV: 95.1 fL (ref 80.0–100.0)
Platelets: 367 10*3/uL (ref 150–400)
RBC: 4.51 MIL/uL (ref 4.22–5.81)
RDW: 13.3 % (ref 11.5–15.5)
WBC: 8.5 10*3/uL (ref 4.0–10.5)
nRBC: 0 % (ref 0.0–0.2)

## 2018-11-27 LAB — APTT: aPTT: 34 seconds (ref 24–36)

## 2018-11-27 MED ORDER — LIDOCAINE-EPINEPHRINE (PF) 2 %-1:200000 IJ SOLN
INTRAMUSCULAR | Status: AC
  Filled 2018-11-27: qty 20

## 2018-11-27 MED ORDER — FENTANYL CITRATE (PF) 100 MCG/2ML IJ SOLN
INTRAMUSCULAR | Status: AC | PRN
  Administered 2018-11-27 (×2): 50 ug via INTRAVENOUS

## 2018-11-27 MED ORDER — LIDOCAINE-EPINEPHRINE (PF) 2 %-1:200000 IJ SOLN
INTRAMUSCULAR | Status: AC | PRN
  Administered 2018-11-27: 10 mL

## 2018-11-27 MED ORDER — CEFAZOLIN SODIUM-DEXTROSE 2-4 GM/100ML-% IV SOLN
INTRAVENOUS | Status: AC
  Administered 2018-11-27: 2 g via INTRAVENOUS
  Filled 2018-11-27: qty 100

## 2018-11-27 MED ORDER — MIDAZOLAM HCL 2 MG/2ML IJ SOLN
INTRAMUSCULAR | Status: AC
  Filled 2018-11-27: qty 4

## 2018-11-27 MED ORDER — CEFAZOLIN SODIUM-DEXTROSE 2-4 GM/100ML-% IV SOLN
2.0000 g | Freq: Once | INTRAVENOUS | Status: AC
  Administered 2018-11-27: 2 g via INTRAVENOUS

## 2018-11-27 MED ORDER — LIDOCAINE HCL (PF) 1 % IJ SOLN
INTRAMUSCULAR | Status: AC | PRN
  Administered 2018-11-27: 5 mL

## 2018-11-27 MED ORDER — LACTATED RINGERS IV SOLN: INTRAVENOUS | Status: DC

## 2018-11-27 MED ORDER — FENTANYL CITRATE (PF) 100 MCG/2ML IJ SOLN
INTRAMUSCULAR | Status: AC
  Filled 2018-11-27: qty 2

## 2018-11-27 MED ORDER — MIDAZOLAM HCL 2 MG/2ML IJ SOLN
INTRAMUSCULAR | Status: AC | PRN
  Administered 2018-11-27 (×3): 1 mg via INTRAVENOUS

## 2018-11-27 MED ORDER — HEPARIN SOD (PORK) LOCK FLUSH 100 UNIT/ML IV SOLN
INTRAVENOUS | Status: AC
  Filled 2018-11-27: qty 5

## 2018-11-27 MED ORDER — HEPARIN SOD (PORK) LOCK FLUSH 100 UNIT/ML IV SOLN
INTRAVENOUS | Status: AC | PRN
  Administered 2018-11-27: 500 [IU] via INTRAVENOUS

## 2018-11-27 NOTE — Discharge Instructions (Signed)
You may remove your dressing and shower tomorrow.  DO NOT use EMLA cream for 2 weeks after port placement as this cream will remove surgical glue on your incision.  Implanted [facility] Guide An implanted port is a device that is placed under the skin. It is usually placed in the chest. The device can be used to give IV medicine, to take blood, or for dialysis. You may have an implanted port if:  You need IV medicine that would be irritating to the small veins in your hands or arms.  You need IV medicines, such as antibiotics, for a long period of time.  You need IV nutrition for a long period of time.  You need dialysis. Having a port means that your health care provider will not need to use the veins in your arms for these procedures. You may have fewer limitations when using a port than you would if you used other types of long-term IVs, and you will likely be able to return to normal activities after your incision heals. An implanted port has two main parts:  Reservoir. The reservoir is the part where a needle is inserted to give medicines or draw blood. The reservoir is round. After it is placed, it appears as a small, raised area under your skin.  Catheter. The catheter is a thin, flexible tube that connects the reservoir to a vein. Medicine that is inserted into the reservoir goes into the catheter and then into the vein. How is my port accessed? To access your port:  A numbing cream may be placed on the skin over the port site.  Your health care provider will put on a mask and sterile gloves.  The skin over your port will be cleaned carefully with a germ-killing soap and allowed to dry.  Your health care provider will gently pinch the port and insert a needle into it.  Your health care provider will check for a blood return to make sure the port is in the vein and is not clogged.  If your port needs to remain accessed to get medicine continuously (constant infusion), your  health care provider will place a clear bandage (dressing) over the needle site. The dressing and needle will need to be changed every week, or as told by your health care provider. What is flushing? Flushing helps keep the port from getting clogged. Follow instructions from your health care provider about how and when to flush the port. Ports are usually flushed with saline solution or a medicine called heparin. The need for flushing will depend on how the port is used:  If the port is only used from time to time to give medicines or draw blood, the port may need to be flushed: ? Before and after medicines have been given. ? Before and after blood has been drawn. ? As part of routine maintenance. Flushing may be recommended every 4-6 weeks.  If a constant infusion is running, the port may not need to be flushed.  Throw away any syringes in a disposal container that is meant for sharp items (sharps container). You can buy a sharps container from a pharmacy, or you can make one by using an empty hard plastic bottle with a cover. How long will my port stay implanted? The port can stay in for as long as your health care provider thinks it is needed. When it is time for the port to come out, a surgery will be done to remove it. The surgery  will be similar to the procedure that was done to put the port in. Follow these instructions at home:   Flush your port as told by your health care provider.  If you need an infusion over several days, follow instructions from your health care provider about how to take care of your port site. Make sure you: ? Wash your hands with soap and water before you change your dressing. If soap and water are not available, use alcohol-based hand sanitizer. ? Change your dressing as told by your health care provider. ? Place any used dressings or infusion bags into a plastic bag. Throw that bag in the trash. ? Keep the dressing that covers the needle clean and dry. Do not  get it wet. ? Do not use scissors or sharp objects near the tube. ? Keep the tube clamped, unless it is being used.  Check your port site every day for signs of infection. Check for: ? Redness, swelling, or pain. ? Fluid or blood. ? Pus or a bad smell.  Protect the skin around the port site. ? Avoid wearing bra straps that rub or irritate the site. ? Protect the skin around your port from seat belts. Place a soft pad over your chest if needed.  Bathe or shower as told by your health care provider. The site may get wet as long as you are not actively receiving an infusion.  Return to your normal activities as told by your health care provider. Ask your health care provider what activities are safe for you.  Carry a medical alert card or wear a medical alert bracelet at all times. This will let health care providers know that you have an implanted port in case of an emergency. Get help right away if:  You have redness, swelling, or pain at the port site.  You have fluid or blood coming from your port site.  You have pus or a bad smell coming from the port site.  You have a fever. Summary  Implanted ports are usually placed in the chest for long-term IV access.  Follow instructions from your health care provider about flushing the port and changing bandages (dressings).  Take care of the area around your port by avoiding clothing that puts pressure on the area, and by watching for signs of infection.  Protect the skin around your port from seat belts. Place a soft pad over your chest if needed.  Get help right away if you have a fever or you have redness, swelling, pain, drainage, or a bad smell at the port site. This information is not intended to replace advice given to you by your health care provider. Make sure you discuss any questions you have with your health care provider. Document Released: 10/29/2005 Document Revised: 12/01/2016 Document Reviewed: 12/01/2016 Elsevier  Interactive Patient Education  2019 [place].   Moderate Conscious Sedation, Adult, Care After These instructions provide you with information about caring for yourself after your procedure. Your health care provider may also give you more specific instructions. Your treatment has been planned according to current medical practices, but problems sometimes occur. Call your health care provider if you have any problems or questions after your procedure. What can I expect after the procedure? After your procedure, it is common:  To feel sleepy for several hours.  To feel clumsy and have poor balance for several hours.  To have poor judgment for several hours.  To vomit if you eat too soon. Follow these  instructions at home: For at least 24 hours after the procedure:   Do not: ? Participate in activities where you could fall or become injured. ? Drive. ? Use heavy machinery. ? Drink alcohol. ? Take sleeping pills or medicines that cause drowsiness. ? Make important decisions or sign legal documents. ? Take care of children on your own.  Rest. Eating and drinking  Follow the diet recommended by your health care provider.  If you vomit: ? Drink water, juice, or soup when you can drink without vomiting. ? Make sure you have little or no nausea before eating solid foods. General instructions  Have a responsible adult stay with you until you are awake and alert.  Take over-the-counter and prescription medicines only as told by your health care provider.  If you smoke, do not smoke without supervision.  Keep all follow-up visits as told by your health care provider. This is important. Contact a health care provider if:  You keep feeling nauseous or you keep vomiting.  You feel light-headed.  You develop a rash.  You have a fever. Get help right away if:  You have trouble breathing. This information is not intended to replace advice given to you by your health care  provider. Make sure you discuss any questions you have with your health care provider. Document Released: 08/19/2013 Document Revised: 04/02/2016 Document Reviewed: 02/18/2016 Elsevier Interactive Patient Education  2019 [employer].

## 2018-11-27 NOTE — Procedures (Signed)
  Procedure: R IJ Port catheter placement   EBL:   minimal Complications:  none immediate  See full dictation in [employer].  Dillard Cannon MD Main # [phone] Pager  [phone]

## 2018-11-27 NOTE — Progress Notes (Signed)
Oncology Nurse Navigator Documentation  Oncology Nurse Navigator Flowsheets 11/27/2018  Navigator Location [facility]  Navigator Encounter Type Other/I received a call from rad onc in [place].  They needed to have a schedule change for 12/16/2018.  Patient will be seen by them at 7:30 and needs to be seen here at 8:45.  Patient is currently scheduled for an 8:15 appt here so I updated scheduling to call and change appt to 8:45.  I called [employer] and left a vm message with an update.   Treatment Phase Pre-Tx/Tx Discussion  Barriers/Navigation Needs Coordination of Care  Interventions Coordination of Care  Coordination of Care Other  Acuity Level 2  Time Spent with Patient 30

## 2018-11-27 NOTE — Consult Note (Signed)
Chief Complaint: Patient was seen in consultation today for Port-A-Cath placement  Referring Physician(s): Mohamed,Mohamed  Supervising Physician: Arne Cleveland  Patient Status: [facility] Out[facility]  History of Present Illness: Alexander Hatfield is a 63 year old male, ex-smoker, with history of recently diagnosed squamous cell carcinoma of the right lung who presents today for Port-A-Cath placement for treatment.  Past Medical History:  Diagnosis Date  . Arthritis   . Chronic back pain   . Chronic back pain   . Headache   . HOH (hard of hearing)   . Hypertension   . Lung mass   . Pre-diabetes   . Sleep apnea    wears cpap  . Wears glasses   . Wears partial dentures     Past Surgical History:  Procedure Laterality Date  . ANTERIOR CERVICAL DECOMP/DISCECTOMY FUSION N/A 10/27/2018   Procedure: ANTERIOR CERVICAL THREE-FOUR, FOUR-FIVE DECOMPRESSION/DISCECTOMY FUSION TWO LEVELS;  Surgeon: Kary Kos, MD;  Location: [place];  Service: Neurosurgery;  Laterality: N/A;   ANTERIOR CERVICAL THREE-FOUR, FOUR-FIVE DECOMPRESSION/DISCECTOMY FUSION TWO LEVELS  . BACK SURGERY    . MULTIPLE TOOTH EXTRACTIONS    . SPINAL CORD STIMULATOR IMPLANT    . VIDEO BRONCHOSCOPY N/A 10/29/2018   Procedure: VIDEO BRONCHOSCOPY;  Surgeon: Laurin Coder, MD;  Location: [facility] OR;  Service: Thoracic;  Laterality: N/A;  . VIDEO BRONCHOSCOPY WITH ENDOBRONCHIAL ULTRASOUND N/A 10/27/2018   Procedure: VIDEO BRONCHOSCOPY WITH ENDOBRONCHIAL ULTRASOUND;  Surgeon: Laurin Coder, MD;  Location: [place];  Service: Thoracic;  Laterality: N/A;  . VIDEO BRONCHOSCOPY WITH ENDOBRONCHIAL ULTRASOUND N/A 10/29/2018   Procedure: VIDEO BRONCHOSCOPY WITH ENDOBRONCHIAL ULTRASOUND;  Surgeon: Laurin Coder, MD;  Location: [facility] OR;  Service: Thoracic;  Laterality: N/A;    Allergies: Iohexol  Medications: Prior to Admission medications   Medication Sig Start Date End Date Taking? Authorizing Provider  amLODipine (NORVASC) 10 MG  tablet Take 10 mg by mouth daily.  09/16/17  Yes [provider]  baclofen (LIORESAL) 20 MG tablet Take 20 mg by mouth 3 (three) times daily as needed for muscle spasms.  09/08/18  Yes [provider]  cyclobenzaprine (FLEXERIL) 10 MG tablet Take 1 tablet (10 mg total) by mouth 3 (three) times daily as needed for muscle spasms. 10/30/18  Yes Meyran, Ocie Cornfield, NP  furosemide (LASIX) 20 MG tablet Take 20 mg by mouth daily.  09/25/18  Yes [provider]  gabapentin (NEURONTIN) 300 MG capsule Take 600 mg by mouth 3 (three) times daily.  09/22/18  Yes [provider]  losartan (COZAAR) 100 MG tablet Take 100 mg by mouth daily.    Yes [provider]  meloxicam (MOBIC) 15 MG tablet Take 15 mg by mouth daily.  08/20/18  Yes [provider]  oxycodone (ROXICODONE) 30 MG immediate release tablet Take 30 mg by mouth every 8 (eight) hours.  10/01/18  Yes [provider]  potassium chloride SA (K-DUR,KLOR-CON) 20 MEQ tablet Take 20 mEq by mouth every evening.  05/16/17  Yes [provider]  pseudoephedrine (SUDAFED) 120 MG 12 hr tablet Take 120 mg by mouth 2 (two) times daily as needed for congestion.   Yes [provider]  lidocaine-prilocaine (EMLA) cream Apply 1 application topically as needed. 11/20/18   Curt Bears, MD  oxyCODONE (ROXICODONE) 5 MG immediate release tablet Take 1 tablet (5 mg total) by mouth every 6 (six) hours as needed. 10/30/18 10/30/19  Meyran, Ocie Cornfield, NP  prochlorperazine (COMPAZINE) 10 MG tablet Take 1 tablet (10  mg total) by mouth every 6 (six) hours as needed for nausea or vomiting. 11/20/18   Curt Bears, MD     Family History  Problem Relation Age of Onset  . Lupus Mother   . Lung disease Father     Social History   Socioeconomic History  . Marital status: Married    Spouse name: Not on file  . Number of children: Not on file  . Years of education: Not on file  . Highest  education level: Not on file  Occupational History  . Not on file  Social Needs  . Financial resource strain: Not on file  . Food insecurity:    Worry: Not on file    Inability: Not on file  . Transportation needs:    Medical: Not on file    Non-medical: Not on file  Tobacco Use  . Smoking status: Former Smoker    Types: Cigarettes  . Smokeless tobacco: Never Used  . Tobacco comment: Quit smoking cigarettes 15 years ago  Substance and Sexual Activity  . Alcohol use: No  . Drug use: No  . Sexual activity: Not on file  Lifestyle  . Physical activity:    Days per week: Not on file    Minutes per session: Not on file  . Stress: Not on file  Relationships  . Social connections:    Talks on phone: Not on file    Gets together: Not on file    Attends religious service: Not on file    Active member of club or organization: Not on file    Attends meetings of clubs or organizations: Not on file    Relationship status: Not on file  Other Topics Concern  . Not on file  Social History Narrative  . Not on file      Review of Systems he denies fever, headache, chest pain, dyspnea, cough, abdominal pain, nausea, vomiting or bleeding.  He does have back pain as well as paresthesias of the hands and feet.  Vital Signs: Blood pressure 130/82, heart rate 102, temp 98.3, respirations 18, O2 sat 98 % room air   Physical Exam awake, alert.  Chest clear to auscultation bilaterally.  Heart with slightly tachycardic but regular rhythm.  Abdomen soft, positive bowel sounds, nontender. No sig LE edema  Imaging: Ct Head Wo Contrast  Result Date: 11/27/2018 CLINICAL DATA:  Recent diagnosis of non-small cell carcinoma of the right lung. Recent anterior cervical discectomy and fusion. Left-sided weakness. EXAM: CT HEAD WITHOUT CONTRAST TECHNIQUE: Contiguous axial images were obtained from the base of the skull through the vertex without intravenous contrast. COMPARISON:  Head CT 06/30/2017  FINDINGS: Brain: No CT evidence of mass lesion or acute infarction. Mild to moderate chronic small-vessel ischemic changes affect the cerebral hemispheric white matter. No hemorrhage, hydrocephalus or extra-axial collection. Vascular: There is atherosclerotic calcification of the major vessels at the base of the brain. Skull: Negative Sinuses/Orbits: Clear/normal Other: None IMPRESSION: No acute finding. No evidence of metastatic disease. Chronic small-vessel ischemic changes of the cerebral hemispheric white matter. Electronically Signed   By: Nelson Chimes M.D.   On: 11/27/2018 07:37   Dg Chest Port 1 View  Result Date: 10/29/2018 CLINICAL DATA:  Status post bronchoscopy. EXAM: PORTABLE CHEST 1 VIEW COMPARISON:  Radiographs of October 16, 2018. FINDINGS: Stable cardiomediastinal silhouette is noted. Atherosclerosis of thoracic aorta is noted. No pneumothorax is noted. Right suprahilar mass is again noted concerning for malignancy. Increased right upper lobe airspace opacity  is noted most consistent with atelectasis. Bony thorax is unremarkable. IMPRESSION: No pneumothorax status post bronchoscopy. New right upper lobe airspace opacity is noted most consistent with atelectasis. Stable right suprahilar mass is noted. Electronically Signed   By: Marijo Conception, M.D.   On: 10/29/2018 13:07   Dg C-arm Bronchoscopy  Result Date: 10/29/2018 C-ARM BRONCHOSCOPY: Fluoroscopy was utilized by the requesting physician.  No radiographic interpretation.    Labs:  CBC: Recent Labs    10/27/18 1205 11/20/18 1354  WBC 11.1* 8.0  HGB 12.7* 12.8*  HCT 40.3 39.6  PLT 286 305    COAGS: Recent Labs    10/27/18 1205  INR 1.01  APTT 35    BMP: Recent Labs    10/27/18 1205 11/20/18 1354  NA 141 141  K 3.9 3.9  CL 106 104  CO2 25 26  GLUCOSE 96 108*  BUN 10 9  CALCIUM 9.2 9.4  CREATININE 0.90 0.85  GFRNONAA >60 >60  GFRAA >60 >60    LIVER FUNCTION TESTS: Recent Labs    10/27/18 1205  11/20/18 1354  BILITOT 0.5 0.4  AST 17 25  ALT 10 37  ALKPHOS 85 110  PROT 6.8 7.1  ALBUMIN 3.5 3.3*    TUMOR MARKERS: No results for input(s): AFPTM, CEA, CA199, CHROMGRNA in the last 8760 hours.  Assessment and Plan: 63 year old male, ex-smoker, with history of recently diagnosed squamous cell carcinoma of the right lung who presents today for Port-A-Cath placement for treatment.Risks and benefits of image guided port-a-catheter placement was discussed with the patient including, but not limited to bleeding, infection, pneumothorax, or fibrin sheath development and need for additional procedures.  All of the patient's questions were answered, patient is agreeable to proceed. Consent signed and in chart.     Thank you for this interesting consult.  I greatly enjoyed meeting Alexander Hatfield. and look forward to participating in their care.  A copy of this report was sent to the requesting provider on this date.  Electronically Signed: D. Rowe Robert, PA-C 11/27/2018, 12:57 PM   I spent a total of 25 minutes   in face to face in clinical consultation, greater than 50% of which was counseling/coordinating care for Port-A-Cath placement

## 2018-11-28 ENCOUNTER — Other Ambulatory Visit: Payer: Self-pay | Admitting: Radiology

## 2018-12-01 ENCOUNTER — Other Ambulatory Visit: Payer: Self-pay

## 2018-12-01 ENCOUNTER — Other Ambulatory Visit: Payer: Self-pay | Admitting: Internal Medicine

## 2018-12-01 ENCOUNTER — Ambulatory Visit (HOSPITAL_COMMUNITY)
Admission: RE | Admit: 2018-12-01 | Discharge: 2018-12-01 | Disposition: A | Discharge status: Home / Self Care | Payer: Medicare Other | Source: Ambulatory Visit | Attending: Internal Medicine | Admitting: Internal Medicine

## 2018-12-01 ENCOUNTER — Encounter (HOSPITAL_COMMUNITY): Payer: Self-pay

## 2018-12-01 DIAGNOSIS — I10 Essential (primary) hypertension: Secondary | ICD-10-CM | POA: Diagnosis not present

## 2018-12-01 DIAGNOSIS — Z791 Long term (current) use of non-steroidal anti-inflammatories (NSAID): Secondary | ICD-10-CM | POA: Insufficient documentation

## 2018-12-01 DIAGNOSIS — Z87891 Personal history of nicotine dependence: Secondary | ICD-10-CM | POA: Diagnosis not present

## 2018-12-01 DIAGNOSIS — M1990 Unspecified osteoarthritis, unspecified site: Secondary | ICD-10-CM | POA: Insufficient documentation

## 2018-12-01 DIAGNOSIS — K118 Other diseases of salivary glands: Secondary | ICD-10-CM | POA: Diagnosis not present

## 2018-12-01 DIAGNOSIS — C3490 Malignant neoplasm of unspecified part of unspecified bronchus or lung: Secondary | ICD-10-CM | POA: Diagnosis not present

## 2018-12-01 DIAGNOSIS — C3491 Malignant neoplasm of unspecified part of right bronchus or lung: Secondary | ICD-10-CM | POA: Insufficient documentation

## 2018-12-01 DIAGNOSIS — G4730 Sleep apnea, unspecified: Secondary | ICD-10-CM | POA: Diagnosis not present

## 2018-12-01 DIAGNOSIS — Z79899 Other long term (current) drug therapy: Secondary | ICD-10-CM | POA: Diagnosis not present

## 2018-12-01 DIAGNOSIS — R7303 Prediabetes: Secondary | ICD-10-CM | POA: Insufficient documentation

## 2018-12-01 DIAGNOSIS — C3411 Malignant neoplasm of upper lobe, right bronchus or lung: Secondary | ICD-10-CM | POA: Diagnosis not present

## 2018-12-01 DIAGNOSIS — R590 Localized enlarged lymph nodes: Secondary | ICD-10-CM | POA: Diagnosis not present

## 2018-12-01 LAB — CBC
HCT: 41.7 % (ref 39.0–52.0)
Hemoglobin: 13.3 g/dL (ref 13.0–17.0)
MCH: 30 pg (ref 26.0–34.0)
MCHC: 31.9 g/dL (ref 30.0–36.0)
MCV: 94.1 fL (ref 80.0–100.0)
Platelets: 306 10*3/uL (ref 150–400)
RBC: 4.43 MIL/uL (ref 4.22–5.81)
RDW: 13.2 % (ref 11.5–15.5)
WBC: 6.5 10*3/uL (ref 4.0–10.5)
nRBC: 0 % (ref 0.0–0.2)

## 2018-12-01 LAB — PROTIME-INR
INR: 1.06
Prothrombin Time: 13.7 seconds (ref 11.4–15.2)

## 2018-12-01 MED ORDER — LIDOCAINE HCL (PF) 1 % IJ SOLN
INTRAMUSCULAR | Status: AC
  Filled 2018-12-01: qty 30

## 2018-12-01 MED ORDER — FENTANYL CITRATE (PF) 100 MCG/2ML IJ SOLN
INTRAMUSCULAR | Status: AC | PRN
  Administered 2018-12-01: 50 ug via INTRAVENOUS
  Administered 2018-12-01: 25 ug via INTRAVENOUS

## 2018-12-01 MED ORDER — SODIUM CHLORIDE 0.9 % IV SOLN: INTRAVENOUS | Status: DC

## 2018-12-01 MED ORDER — FENTANYL CITRATE (PF) 100 MCG/2ML IJ SOLN
INTRAMUSCULAR | Status: AC
  Filled 2018-12-01: qty 2

## 2018-12-01 MED ORDER — MIDAZOLAM HCL 2 MG/2ML IJ SOLN
INTRAMUSCULAR | Status: AC | PRN
  Administered 2018-12-01: 1 mg via INTRAVENOUS
  Administered 2018-12-01: 0.5 mg via INTRAVENOUS

## 2018-12-01 MED ORDER — MIDAZOLAM HCL 2 MG/2ML IJ SOLN
INTRAMUSCULAR | Status: AC
  Filled 2018-12-01: qty 2

## 2018-12-01 NOTE — H&P (Signed)
Chief Complaint: Patient was seen in consultation today for left parotid nodule biopsy at the request of [facility]  Referring Physician(s): Mohamed,Mohamed  Supervising Physician: Markus Daft  Patient Status: [facility] - Out-pt  History of Present Illness: Alexander Hatfield. is a 63 y.o. male   Recent dx Lung cancer  PET IMPRESSION: 1. Intensely hypermetabolic soft tissue nodule posterior to the left mandibular angle could reflect a hypermetabolic lymph node or a primary parotid lesion. No lesion of the pharyngeal mucosal space identified. ENT evaluation recommended. This is not likely metastatic from the presumed right upper lobe bronchogenic carcinoma. 2. Spiculated right upper lobe lung mass and enlarged right paratracheal lymph node are hypermetabolic, consistent with bronchogenic carcinoma. Presuming the left neck nodule is unrelated, this represents stage IIIA disease (T2b N2 M0).  Discovered lung cancer on pre op cxr for back surgery The final cytology from the fine-needle aspiration of the 4R lymph node ([id number]) showed malignant cells consistent with non-small cell carcinoma favoring adenocarcinoma.  Dr Julien Nordmann note 11/20/18: recently diagnosed with a stage IIIa (T2b, N2, M0) non-small cell lung cancer, squamous cell carcinoma presented with right upper lobe lung mass in addition to right paratracheal lymphadenopathy diagnosed in December 2019.  The patient also has a hypermetabolic lesion in the left parotid gland that need further evaluation.  Now scheduled for left parotid nodule biopsy Planning strategies  Past Medical History:  Diagnosis Date  . Arthritis   . Chronic back pain   . Chronic back pain   . Headache   . HOH (hard of hearing)   . Hypertension   . Lung mass   . Pre-diabetes   . Sleep apnea    wears cpap  . Wears glasses   . Wears partial dentures     Past Surgical History:  Procedure Laterality Date  . ANTERIOR CERVICAL  DECOMP/DISCECTOMY FUSION N/A 10/27/2018   Procedure: ANTERIOR CERVICAL THREE-FOUR, FOUR-FIVE DECOMPRESSION/DISCECTOMY FUSION TWO LEVELS;  Surgeon: Kary Kos, MD;  Location: [place];  Service: Neurosurgery;  Laterality: N/A;   ANTERIOR CERVICAL THREE-FOUR, FOUR-FIVE DECOMPRESSION/DISCECTOMY FUSION TWO LEVELS  . BACK SURGERY    . IR IMAGING GUIDED PORT INSERTION  11/27/2018  . MULTIPLE TOOTH EXTRACTIONS    . SPINAL CORD STIMULATOR IMPLANT    . VIDEO BRONCHOSCOPY N/A 10/29/2018   Procedure: VIDEO BRONCHOSCOPY;  Surgeon: Laurin Coder, MD;  Location: [facility] OR;  Service: Thoracic;  Laterality: N/A;  . VIDEO BRONCHOSCOPY WITH ENDOBRONCHIAL ULTRASOUND N/A 10/27/2018   Procedure: VIDEO BRONCHOSCOPY WITH ENDOBRONCHIAL ULTRASOUND;  Surgeon: Laurin Coder, MD;  Location: [place];  Service: Thoracic;  Laterality: N/A;  . VIDEO BRONCHOSCOPY WITH ENDOBRONCHIAL ULTRASOUND N/A 10/29/2018   Procedure: VIDEO BRONCHOSCOPY WITH ENDOBRONCHIAL ULTRASOUND;  Surgeon: Laurin Coder, MD;  Location: [facility] OR;  Service: Thoracic;  Laterality: N/A;    Allergies: Iohexol  Medications: Prior to Admission medications   Medication Sig Start Date End Date Taking? Authorizing Provider  amLODipine (NORVASC) 10 MG tablet Take 10 mg by mouth daily.  09/16/17  Yes [provider]  baclofen (LIORESAL) 20 MG tablet Take 20 mg by mouth 3 (three) times daily as needed for muscle spasms.  09/08/18  Yes [provider]  cyclobenzaprine (FLEXERIL) 10 MG tablet Take 1 tablet (10 mg total) by mouth 3 (three) times daily as needed for muscle spasms. 10/30/18  Yes Meyran, Ocie Cornfield, NP  furosemide (LASIX) 20 MG tablet Take 20 mg by mouth daily.  09/25/18  Yes [provider]  gabapentin (NEURONTIN) 300 MG capsule Take 600 mg by mouth 3 (three) times daily.  09/22/18  Yes [provider]  losartan (COZAAR) 100 MG tablet Take 100 mg by mouth daily.    Yes [provider]  meloxicam  (MOBIC) 15 MG tablet Take 15 mg by mouth daily.  08/20/18  Yes [provider]  oxycodone (ROXICODONE) 30 MG immediate release tablet Take 30 mg by mouth every 8 (eight) hours.  10/01/18  Yes [provider]  potassium chloride SA (K-DUR,KLOR-CON) 20 MEQ tablet Take 20 mEq by mouth every evening.  05/16/17  Yes [provider]  pseudoephedrine (SUDAFED) 120 MG 12 hr tablet Take 120 mg by mouth 2 (two) times daily as needed for congestion.   Yes [provider]  lidocaine-prilocaine (EMLA) cream Apply 1 application topically as needed. 11/20/18   Curt Bears, MD  oxyCODONE (ROXICODONE) 5 MG immediate release tablet Take 1 tablet (5 mg total) by mouth every 6 (six) hours as needed. 10/30/18 10/30/19  Meyran, Ocie Cornfield, NP  prochlorperazine (COMPAZINE) 10 MG tablet Take 1 tablet (10 mg total) by mouth every 6 (six) hours as needed for nausea or vomiting. 11/20/18   Curt Bears, MD     Family History  Problem Relation Age of Onset  . Lupus Mother   . Lung disease Father     Social History   Socioeconomic History  . Marital status: Married    Spouse name: Not on file  . Number of children: Not on file  . Years of education: Not on file  . Highest education level: Not on file  Occupational History  . Not on file  Social Needs  . Financial resource strain: Not on file  . Food insecurity:    Worry: Not on file    Inability: Not on file  . Transportation needs:    Medical: Not on file    Non-medical: Not on file  Tobacco Use  . Smoking status: Former Smoker    Types: Cigarettes  . Smokeless tobacco: Never Used  . Tobacco comment: Quit smoking cigarettes 15 years ago  Substance and Sexual Activity  . Alcohol use: No  . Drug use: No  . Sexual activity: Not on file  Lifestyle  . Physical activity:    Days per week: Not on file    Minutes per session: Not on file  . Stress: Not on file  Relationships  . Social connections:    Talks on  phone: Not on file    Gets together: Not on file    Attends religious service: Not on file    Active member of club or organization: Not on file    Attends meetings of clubs or organizations: Not on file    Relationship status: Not on file  Other Topics Concern  . Not on file  Social History Narrative  . Not on file     Review of Systems: A 12 point ROS discussed and pertinent positives are indicated in the HPI above.  All other systems are negative.  Review of Systems  Constitutional: Positive for unexpected weight change. Negative for activity change, fatigue and fever.  HENT: Negative for trouble swallowing.   Respiratory: Negative for cough and shortness of breath.   Cardiovascular: Negative for chest pain.  Neurological: Negative for weakness.  Psychiatric/Behavioral: Negative for behavioral problems and confusion.    Vital Signs: There were no vitals taken for this visit.  Physical Exam Vitals signs reviewed.  Cardiovascular:  Rate and Rhythm: Normal rate and regular rhythm.     Heart sounds: Normal heart sounds.  Pulmonary:     Effort: Pulmonary effort is normal.     Breath sounds: Normal breath sounds.  Abdominal:     General: Bowel sounds are normal.  Musculoskeletal: Normal range of motion.  Skin:    General: Skin is warm and dry.  Neurological:     General: No focal deficit present.     Mental Status: He is oriented to person, place, and time.  Psychiatric:        Mood and Affect: Mood normal.        Behavior: Behavior normal.        Thought Content: Thought content normal.        Judgment: Judgment normal.     Imaging: Ct Head Wo Contrast  Result Date: 11/27/2018 CLINICAL DATA:  Recent diagnosis of non-small cell carcinoma of the right lung. Recent anterior cervical discectomy and fusion. Left-sided weakness. EXAM: CT HEAD WITHOUT CONTRAST TECHNIQUE: Contiguous axial images were obtained from the base of the skull through the vertex without  intravenous contrast. COMPARISON:  Head CT 06/30/2017 FINDINGS: Brain: No CT evidence of mass lesion or acute infarction. Mild to moderate chronic small-vessel ischemic changes affect the cerebral hemispheric white matter. No hemorrhage, hydrocephalus or extra-axial collection. Vascular: There is atherosclerotic calcification of the major vessels at the base of the brain. Skull: Negative Sinuses/Orbits: Clear/normal Other: None IMPRESSION: No acute finding. No evidence of metastatic disease. Chronic small-vessel ischemic changes of the cerebral hemispheric white matter. Electronically Signed   By: Nelson Chimes M.D.   On: 11/27/2018 07:37   Ir Imaging Guided Port Insertion  Result Date: 11/27/2018 CLINICAL DATA:  Lung carcinoma. Needs durable venous access for chemotherapy regimen. EXAM: TUNNELED PORT CATHETER PLACEMENT WITH ULTRASOUND AND FLUOROSCOPIC GUIDANCE FLUOROSCOPY TIME:  0.1 minute; 54  uGym2 DAP ANESTHESIA/SEDATION: Intravenous Fentanyl and Versed were administered as conscious sedation during continuous monitoring of the patient's level of consciousness and physiological / cardiorespiratory status by the radiology RN, with a total moderate sedation time of 13 minutes. TECHNIQUE: The procedure, risks, benefits, and alternatives were explained to the patient. Questions regarding the procedure were encouraged and answered. The patient understands and consents to the procedure. As antibiotic prophylaxis, cefazolin 2 g was ordered pre-procedure and administered intravenously within one hour of incision. Patency of the right IJ vein was confirmed with ultrasound with image documentation. An appropriate skin site was determined. Skin site was marked. Region was prepped using maximum barrier technique including cap and mask, sterile gown, sterile gloves, large sterile sheet, and Chlorhexidine as cutaneous antisepsis. The region was infiltrated locally with 1% lidocaine. Under real-time ultrasound guidance, the  right IJ vein was accessed with a 21 gauge micropuncture needle; the needle tip within the vein was confirmed with ultrasound image documentation. Needle was exchanged over a 018 guidewire for transitional dilator which allowed passage of the [facility] wire into the IVC. Over this, the transitional dilator was exchanged for a 5 [place] MPA catheter. A small incision was made on the right anterior chest wall and a subcutaneous pocket fashioned. The power-injectable port was positioned and its catheter tunneled to the right IJ dermatotomy site. The MPA catheter was exchanged over an Amplatz wire for a peel-away sheath, through which the port catheter, which had been trimmed to the appropriate length, was advanced and positioned under fluoroscopy with its tip at the cavoatrial junction. Spot chest radiograph confirms good catheter position  and no pneumothorax. The pocket was closed with deep interrupted and subcuticular continuous 3-0 Monocryl sutures. The port was flushed per protocol. The incisions were covered with Dermabond then covered with a sterile dressing. COMPLICATIONS: COMPLICATIONS None immediate IMPRESSION: Technically successful right IJ power-injectable port catheter placement. Ready for routine use. Electronically Signed   By: Lucrezia Europe M.D.   On: 11/27/2018 15:27    Labs:  CBC: Recent Labs    10/27/18 1205 11/20/18 1354 11/27/18 1244 12/01/18 1111  WBC 11.1* 8.0 8.5 6.5  HGB 12.7* 12.8* 13.2 13.3  HCT 40.3 39.6 42.9 41.7  PLT 286 305 367 306    COAGS: Recent Labs    10/27/18 1205 11/27/18 1244  INR 1.01 0.94  APTT 35 34    BMP: Recent Labs    10/27/18 1205 11/20/18 1354  NA 141 141  K 3.9 3.9  CL 106 104  CO2 25 26  GLUCOSE 96 108*  BUN 10 9  CALCIUM 9.2 9.4  CREATININE 0.90 0.85  GFRNONAA >60 >60  GFRAA >60 >60    LIVER FUNCTION TESTS: Recent Labs    10/27/18 1205 11/20/18 1354  BILITOT 0.5 0.4  AST 17 25  ALT 10 37  ALKPHOS 85 110  PROT 6.8 7.1    ALBUMIN 3.5 3.3*    TUMOR MARKERS: No results for input(s): AFPTM, CEA, CA199, CHROMGRNA in the last 8760 hours.  Assessment and Plan:  +PET left parotid nodule Recent dx lung cancer; [place] adenocarcinoma Scheduled for parotid nodule biopsy Risks and benefits discussed with the patient including, but not limited to bleeding, infection, damage to adjacent structures or low yield requiring additional tests.  All of the patient's questions were answered, patient is agreeable to proceed. Consent signed and in chart.    Thank you for this interesting consult.  I greatly enjoyed meeting Alexander Hatfield. and look forward to participating in their care.  A copy of this report was sent to the requesting provider on this date.  Electronically Signed: Lavonia Drafts, PA-C 12/01/2018, 11:52 AM   I spent a total of    25 Minutes in face to face in clinical consultation, greater than 50% of which was counseling/coordinating care for left parotid nodule biopsy

## 2018-12-01 NOTE — Discharge Instructions (Signed)
Needle Biopsy, Care After These instructions tell you how to care for yourself after your procedure. Your doctor may also give you more specific instructions. Call your doctor if you have any problems or questions. What can I expect after the procedure? After the procedure, it is common to have:  Soreness.  Bruising.  Mild pain. Follow these instructions at home:   Return to your normal activities as told by your doctor. Ask your doctor what activities are safe for you.  Take over-the-counter and prescription medicines only as told by your doctor.  Wash your hands with soap and water before you remove your bandage (dressing). If you cannot use soap and water, use hand sanitizer.  Follow instructions from your doctor about: ? How to take care of your puncture site. ? When to remove your bandage. May remove in 24 hours  Check your puncture site every day for signs of infection. Watch for: ? Redness, swelling, or pain. ? Fluid or blood. ? Pus or a bad smell. ? Warmth.  Do not take baths, swim, or use a hot tub until your doctor approves. Ask your doctor if you may take showers. You may only be allowed to take sponge baths.  Keep all follow-up visits as told by your doctor. This is important. Contact a doctor if you have:  A fever.  Redness, swelling, or pain at the puncture site, and it lasts longer than a few days.  Fluid, blood, or pus coming from the puncture site.  Warmth coming from the puncture site. Get help right away if:  You have a lot of bleeding from the puncture site. Summary  After the procedure, it is common to have soreness, bruising, or mild pain at the puncture site.  Check your puncture site every day for signs of infection, such as redness, swelling, or pain.  Get help right away if you have severe bleeding from your puncture site. This information is not intended to replace advice given to you by your health care provider. Make sure you discuss  any questions you have with your health care provider. Document Released: 10/11/2008 Document Revised: 11/11/2017 Document Reviewed: 11/11/2017 Elsevier Interactive Patient Education  2019 [place].  Moderate Conscious Sedation, Adult, Care After These instructions provide you with information about caring for yourself after your procedure. Your health care provider may also give you more specific instructions. Your treatment has been planned according to current medical practices, but problems sometimes occur. Call your health care provider if you have any problems or questions after your procedure. What can I expect after the procedure? After your procedure, it is common:  To feel sleepy for several hours.  To feel clumsy and have poor balance for several hours.  To have poor judgment for several hours.  To vomit if you eat too soon. Follow these instructions at home: For at least 24 hours after the procedure:   Do not: ? Participate in activities where you could fall or become injured. ? Drive. ? Use heavy machinery. ? Drink alcohol. ? Take sleeping pills or medicines that cause drowsiness. ? Make important decisions or sign legal documents. ? Take care of children on your own.  Rest. Eating and drinking  Follow the diet recommended by your health care provider.  If you vomit: ? Drink water, juice, or soup when you can drink without vomiting. ? Make sure you have little or no nausea before eating solid foods. General instructions  Have a responsible adult stay with you  until you are awake and alert.  Take over-the-counter and prescription medicines only as told by your health care provider.  If you smoke, do not smoke without supervision.  Keep all follow-up visits as told by your health care provider. This is important. Contact a health care provider if:  You keep feeling nauseous or you keep vomiting.  You feel light-headed.  You develop a rash.  You  have a fever. Get help right away if:  You have trouble breathing. This information is not intended to replace advice given to you by your health care provider. Make sure you discuss any questions you have with your health care provider. Document Released: 08/19/2013 Document Revised: 04/02/2016 Document Reviewed: 02/18/2016 Elsevier Interactive Patient Education  2019 [employer].

## 2018-12-01 NOTE — Procedures (Signed)
Interventional Radiology Procedure:   Indications:  Hypermetabolic nodule or lymph node in left upper neck.  Right lung cancer.  Procedure: US guided FNA of left cervical lymph node  Findings: Multiple normal looking nodes in left upper neck. No parotid lesion.  5 FNAs in lymph node that appears to correspond with lesion on PET.  Complications: None     EBL: Minimal  Plan: Discharge to home.     Addis Bennie R. Anselm Pancoast, MD  Pager: [phone]

## 2018-12-02 ENCOUNTER — Ambulatory Visit: Payer: Self-pay | Admitting: Medical

## 2018-12-02 ENCOUNTER — Inpatient Hospital Stay: Payer: Medicare Other

## 2018-12-02 VITALS — BP 115/68 | HR 80 | Temp 98.2°F | Resp 18

## 2018-12-02 DIAGNOSIS — M545 Low back pain: Secondary | ICD-10-CM | POA: Diagnosis not present

## 2018-12-02 DIAGNOSIS — Z95828 Presence of other vascular implants and grafts: Secondary | ICD-10-CM | POA: Insufficient documentation

## 2018-12-02 DIAGNOSIS — C3491 Malignant neoplasm of unspecified part of right bronchus or lung: Secondary | ICD-10-CM

## 2018-12-02 DIAGNOSIS — K118 Other diseases of salivary glands: Secondary | ICD-10-CM | POA: Diagnosis not present

## 2018-12-02 DIAGNOSIS — Z87891 Personal history of nicotine dependence: Secondary | ICD-10-CM | POA: Diagnosis not present

## 2018-12-02 DIAGNOSIS — M542 Cervicalgia: Secondary | ICD-10-CM | POA: Diagnosis not present

## 2018-12-02 DIAGNOSIS — C771 Secondary and unspecified malignant neoplasm of intrathoracic lymph nodes: Secondary | ICD-10-CM | POA: Diagnosis not present

## 2018-12-02 DIAGNOSIS — R200 Anesthesia of skin: Secondary | ICD-10-CM | POA: Diagnosis not present

## 2018-12-02 DIAGNOSIS — Z510 Encounter for antineoplastic radiation therapy: Secondary | ICD-10-CM | POA: Diagnosis not present

## 2018-12-02 DIAGNOSIS — R7303 Prediabetes: Secondary | ICD-10-CM | POA: Diagnosis not present

## 2018-12-02 DIAGNOSIS — I10 Essential (primary) hypertension: Secondary | ICD-10-CM | POA: Diagnosis not present

## 2018-12-02 DIAGNOSIS — C3411 Malignant neoplasm of upper lobe, right bronchus or lung: Secondary | ICD-10-CM | POA: Diagnosis not present

## 2018-12-02 DIAGNOSIS — Z5111 Encounter for antineoplastic chemotherapy: Secondary | ICD-10-CM | POA: Diagnosis not present

## 2018-12-02 DIAGNOSIS — G8929 Other chronic pain: Secondary | ICD-10-CM | POA: Diagnosis not present

## 2018-12-02 LAB — CBC WITH DIFFERENTIAL (CANCER CENTER ONLY)
Abs Immature Granulocytes: 0.03 10*3/uL (ref 0.00–0.07)
Basophils Absolute: 0 10*3/uL (ref 0.0–0.1)
Basophils Relative: 0 %
Eosinophils Absolute: 0.2 10*3/uL (ref 0.0–0.5)
Eosinophils Relative: 3 %
HCT: 38.5 % — ABNORMAL LOW (ref 39.0–52.0)
Hemoglobin: 12.4 g/dL — ABNORMAL LOW (ref 13.0–17.0)
Immature Granulocytes: 0 %
Lymphocytes Relative: 27 %
Lymphs Abs: 2 10*3/uL (ref 0.7–4.0)
MCH: 29.8 pg (ref 26.0–34.0)
MCHC: 32.2 g/dL (ref 30.0–36.0)
MCV: 92.5 fL (ref 80.0–100.0)
Monocytes Absolute: 0.5 10*3/uL (ref 0.1–1.0)
Monocytes Relative: 7 %
Neutro Abs: 4.5 10*3/uL (ref 1.7–7.7)
Neutrophils Relative %: 63 %
Platelet Count: 301 10*3/uL (ref 150–400)
RBC: 4.16 MIL/uL — ABNORMAL LOW (ref 4.22–5.81)
RDW: 13.2 % (ref 11.5–15.5)
WBC Count: 7.3 10*3/uL (ref 4.0–10.5)
nRBC: 0 % (ref 0.0–0.2)

## 2018-12-02 LAB — CMP (CANCER CENTER ONLY)
ALT: 17 U/L (ref 0–44)
AST: 14 U/L — ABNORMAL LOW (ref 15–41)
Albumin: 3.4 g/dL — ABNORMAL LOW (ref 3.5–5.0)
Alkaline Phosphatase: 108 U/L (ref 38–126)
Anion gap: 11 (ref 5–15)
BUN: 10 mg/dL (ref 8–23)
CO2: 26 mmol/L (ref 22–32)
Calcium: 9 mg/dL (ref 8.9–10.3)
Chloride: 104 mmol/L (ref 98–111)
Creatinine: 0.87 mg/dL (ref 0.61–1.24)
GFR, Est AFR Am: >60 mL/min (ref >60)
GFR, Estimated: >60 mL/min (ref >60)
Glucose, Bld: 133 mg/dL — ABNORMAL HIGH (ref 70–99)
Potassium: 3.7 mmol/L (ref 3.5–5.1)
Sodium: 141 mmol/L (ref 135–145)
Total Bilirubin: 0.5 mg/dL (ref 0.3–1.2)
Total Protein: 7.1 g/dL (ref 6.5–8.1)

## 2018-12-02 MED ORDER — HEPARIN SOD (PORK) LOCK FLUSH 100 UNIT/ML IV SOLN
500.0000 [IU] | Freq: Once | INTRAVENOUS | Status: AC | PRN
  Administered 2018-12-02: 500 [IU]
  Filled 2018-12-02: qty 5

## 2018-12-02 MED ORDER — SODIUM CHLORIDE 0.9% FLUSH
10.0000 mL | Freq: Once | INTRAVENOUS | Status: AC
  Administered 2018-12-02: 10 mL
  Filled 2018-12-02: qty 10

## 2018-12-02 MED ORDER — SODIUM CHLORIDE 0.9 % IV SOLN
45.0000 mg/m2 | Freq: Once | INTRAVENOUS | Status: AC
  Administered 2018-12-02: 120 mg via INTRAVENOUS
  Filled 2018-12-02: qty 20

## 2018-12-02 MED ORDER — SODIUM CHLORIDE 0.9 % IV SOLN
20.0000 mg | Freq: Once | INTRAVENOUS | Status: AC
  Administered 2018-12-02: 20 mg via INTRAVENOUS
  Filled 2018-12-02: qty 2

## 2018-12-02 MED ORDER — SODIUM CHLORIDE 0.9% FLUSH
10.0000 mL | INTRAVENOUS | Status: DC | PRN
  Administered 2018-12-02: 10 mL
  Filled 2018-12-02: qty 10

## 2018-12-02 MED ORDER — FAMOTIDINE IN NACL 20-0.9 MG/50ML-% IV SOLN
20.0000 mg | Freq: Once | INTRAVENOUS | Status: AC
  Administered 2018-12-02: 20 mg via INTRAVENOUS

## 2018-12-02 MED ORDER — PALONOSETRON HCL INJECTION 0.25 MG/5ML
0.2500 mg | Freq: Once | INTRAVENOUS | Status: AC
  Administered 2018-12-02: 0.25 mg via INTRAVENOUS

## 2018-12-02 MED ORDER — DIPHENHYDRAMINE HCL 50 MG/ML IJ SOLN
INTRAMUSCULAR | Status: AC
  Filled 2018-12-02: qty 1

## 2018-12-02 MED ORDER — SODIUM CHLORIDE 0.9 % IV SOLN
Freq: Once | INTRAVENOUS | Status: AC
  Administered 2018-12-02: 14:00:00 via INTRAVENOUS
  Filled 2018-12-02: qty 250

## 2018-12-02 MED ORDER — DIPHENHYDRAMINE HCL 50 MG/ML IJ SOLN
50.0000 mg | Freq: Once | INTRAMUSCULAR | Status: AC
  Administered 2018-12-02: 50 mg via INTRAVENOUS

## 2018-12-02 MED ORDER — FAMOTIDINE IN NACL 20-0.9 MG/50ML-% IV SOLN
INTRAVENOUS | Status: AC
  Filled 2018-12-02: qty 50

## 2018-12-02 MED ORDER — PALONOSETRON HCL INJECTION 0.25 MG/5ML
INTRAVENOUS | Status: AC
  Filled 2018-12-02: qty 5

## 2018-12-02 MED ORDER — SODIUM CHLORIDE 0.9 % IV SOLN
300.0000 mg | Freq: Once | INTRAVENOUS | Status: AC
  Administered 2018-12-02: 300 mg via INTRAVENOUS
  Filled 2018-12-02: qty 30

## 2018-12-02 NOTE — Patient Instructions (Signed)
[facility] Discharge Instructions for Patients Receiving Chemotherapy  Today you received the following chemotherapy agents: paclitaxel (Taxol) and carboplatin (Paraplatin).   To help prevent nausea and vomiting after your treatment, we encourage you to take your nausea medication as prescribed by your physician.    If you develop nausea and vomiting that is not controlled by your nausea medication, call the clinic.   BELOW ARE SYMPTOMS THAT SHOULD BE REPORTED IMMEDIATELY:  *FEVER GREATER THAN 100.5 F  *CHILLS WITH OR WITHOUT FEVER  NAUSEA AND VOMITING THAT IS NOT CONTROLLED WITH YOUR NAUSEA MEDICATION  *UNUSUAL SHORTNESS OF BREATH  *UNUSUAL BRUISING OR BLEEDING  TENDERNESS IN MOUTH AND THROAT WITH OR WITHOUT PRESENCE OF ULCERS  *URINARY PROBLEMS  *BOWEL PROBLEMS  UNUSUAL RASH Items with * indicate a potential emergency and should be followed up as soon as possible.  Feel free to call the clinic should you have any questions or concerns. The clinic phone number is [phone].  Please show the [place] at check-in to the Emergency Department and triage nurse.  Paclitaxel injection What is this medicine? PACLITAXEL (PAK li TAX el) is a chemotherapy drug. It targets fast dividing cells, like cancer cells, and causes these cells to die. This medicine is used to treat ovarian cancer, breast cancer, lung cancer, Kaposi's sarcoma, and other cancers. This medicine may be used for other purposes; ask your health care provider or pharmacist if you have questions. COMMON BRAND NAME(S): Onxol, Taxol What should I tell my health care provider before I take this medicine? They need to know if you have any of these conditions: -history of irregular heartbeat -liver disease -low blood counts, like low white cell, platelet, or red cell counts -lung or breathing disease, like asthma -tingling of the fingers or toes, or other nerve disorder -an unusual or  allergic reaction to paclitaxel, alcohol, polyoxyethylated castor oil, other chemotherapy, other medicines, foods, dyes, or preservatives -pregnant or trying to get pregnant -breast-feeding How should I use this medicine? This drug is given as an infusion into a vein. It is administered in a hospital or clinic by a specially trained health care professional. Talk to your pediatrician regarding the use of this medicine in children. Special care may be needed. Overdosage: If you think you have taken too much of this medicine contact a poison control center or emergency room at once. NOTE: This medicine is only for you. Do not share this medicine with others. What if I miss a dose? It is important not to miss your dose. Call your doctor or health care professional if you are unable to keep an appointment. What may interact with this medicine? Do not take this medicine with any of the following medications: -disulfiram -metronidazole This medicine may also interact with the following medications: -antiviral medicines for hepatitis, HIV or AIDS -certain antibiotics like erythromycin and clarithromycin -certain medicines for fungal infections like ketoconazole and itraconazole -certain medicines for seizures like carbamazepine, phenobarbital, phenytoin -gemfibrozil -nefazodone -rifampin -St. John's wort This list may not describe all possible interactions. Give your health care provider a list of all the medicines, herbs, non-prescription drugs, or dietary supplements you use. Also tell them if you smoke, drink alcohol, or use illegal drugs. Some items may interact with your medicine. What should I watch for while using this medicine? Your condition will be monitored carefully while you are receiving this medicine. You will need important blood work done while you are taking this medicine. This medicine  can cause serious allergic reactions. To reduce your risk you will need to take other  medicine(s) before treatment with this medicine. If you experience allergic reactions like skin rash, itching or hives, swelling of the face, lips, or tongue, tell your doctor or health care professional right away. In some cases, you may be given additional medicines to help with side effects. Follow all directions for their use. This drug may make you feel generally unwell. This is not uncommon, as chemotherapy can affect healthy cells as well as cancer cells. Report any side effects. Continue your course of treatment even though you feel ill unless your doctor tells you to stop. Call your doctor or health care professional for advice if you get a fever, chills or sore throat, or other symptoms of a cold or flu. Do not treat yourself. This drug decreases your body's ability to fight infections. Try to avoid being around people who are sick. This medicine may increase your risk to bruise or bleed. Call your doctor or health care professional if you notice any unusual bleeding. Be careful brushing and flossing your teeth or using a toothpick because you may get an infection or bleed more easily. If you have any dental work done, tell your dentist you are receiving this medicine. Avoid taking products that contain aspirin, acetaminophen, ibuprofen, naproxen, or ketoprofen unless instructed by your doctor. These medicines may hide a fever. Do not become pregnant while taking this medicine. Women should inform their doctor if they wish to become pregnant or think they might be pregnant. There is a potential for serious side effects to an unborn child. Talk to your health care professional or pharmacist for more information. Do not breast-feed an infant while taking this medicine. Men are advised not to father a child while receiving this medicine. This product may contain alcohol. Ask your pharmacist or healthcare provider if this medicine contains alcohol. Be sure to tell all healthcare providers you are  taking this medicine. Certain medicines, like metronidazole and disulfiram, can cause an unpleasant reaction when taken with alcohol. The reaction includes flushing, headache, nausea, vomiting, sweating, and increased thirst. The reaction can last from 30 minutes to several hours. What side effects may I notice from receiving this medicine? Side effects that you should report to your doctor or health care professional as soon as possible: -allergic reactions like skin rash, itching or hives, swelling of the face, lips, or tongue -breathing problems -changes in vision -fast, irregular heartbeat -high or low blood pressure -mouth sores -pain, tingling, numbness in the hands or feet -signs of decreased platelets or bleeding - bruising, pinpoint red spots on the skin, black, tarry stools, blood in the urine -signs of decreased red blood cells - unusually weak or tired, feeling faint or lightheaded, falls -signs of infection - fever or chills, cough, sore throat, pain or difficulty passing urine -signs and symptoms of liver injury like dark yellow or brown urine; general ill feeling or flu-like symptoms; light-colored stools; loss of appetite; nausea; right upper belly pain; unusually weak or tired; yellowing of the eyes or skin -swelling of the ankles, feet, hands -unusually slow heartbeat Side effects that usually do not require medical attention (report to your doctor or health care professional if they continue or are bothersome): -diarrhea -hair loss -loss of appetite -muscle or joint pain -nausea, vomiting -pain, redness, or irritation at site where injected -tiredness This list may not describe all possible side effects. Call your doctor for medical advice about  side effects. You may report side effects to FDA at 1-800-FDA-1088. Where should I keep my medicine? This drug is given in a hospital or clinic and will not be stored at home. NOTE: This sheet is a summary. It may not cover all  possible information. If you have questions about this medicine, talk to your doctor, pharmacist, or health care provider.  2019 [employer] Standard (2017-07-02 13:14:55)  Carboplatin injection What is this medicine? CARBOPLATIN (KAR boe pla tin) is a chemotherapy drug. It targets fast dividing cells, like cancer cells, and causes these cells to die. This medicine is used to treat ovarian cancer and many other cancers. This medicine may be used for other purposes; ask your health care provider or pharmacist if you have questions. COMMON BRAND NAME(S): Paraplatin What should I tell my health care provider before I take this medicine? They need to know if you have any of these conditions: -blood disorders -hearing problems -kidney disease -recent or ongoing radiation therapy -an unusual or allergic reaction to carboplatin, cisplatin, other chemotherapy, other medicines, foods, dyes, or preservatives -pregnant or trying to get pregnant -breast-feeding How should I use this medicine? This drug is usually given as an infusion into a vein. It is administered in a hospital or clinic by a specially trained health care professional. Talk to your pediatrician regarding the use of this medicine in children. Special care may be needed. Overdosage: If you think you have taken too much of this medicine contact a poison control center or emergency room at once. NOTE: This medicine is only for you. Do not share this medicine with others. What if I miss a dose? It is important not to miss a dose. Call your doctor or health care professional if you are unable to keep an appointment. What may interact with this medicine? -medicines for seizures -medicines to increase blood counts like filgrastim, pegfilgrastim, sargramostim -some antibiotics like amikacin, gentamicin, neomycin, streptomycin, tobramycin -vaccines Talk to your doctor or health care professional before taking any of these  medicines: -acetaminophen -aspirin -ibuprofen -ketoprofen -naproxen This list may not describe all possible interactions. Give your health care provider a list of all the medicines, herbs, non-prescription drugs, or dietary supplements you use. Also tell them if you smoke, drink alcohol, or use illegal drugs. Some items may interact with your medicine. What should I watch for while using this medicine? Your condition will be monitored carefully while you are receiving this medicine. You will need important blood work done while you are taking this medicine. This drug may make you feel generally unwell. This is not uncommon, as chemotherapy can affect healthy cells as well as cancer cells. Report any side effects. Continue your course of treatment even though you feel ill unless your doctor tells you to stop. In some cases, you may be given additional medicines to help with side effects. Follow all directions for their use. Call your doctor or health care professional for advice if you get a fever, chills or sore throat, or other symptoms of a cold or flu. Do not treat yourself. This drug decreases your body's ability to fight infections. Try to avoid being around people who are sick. This medicine may increase your risk to bruise or bleed. Call your doctor or health care professional if you notice any unusual bleeding. Be careful brushing and flossing your teeth or using a toothpick because you may get an infection or bleed more easily. If you have any dental work done, tell your dentist you  are receiving this medicine. Avoid taking products that contain aspirin, acetaminophen, ibuprofen, naproxen, or ketoprofen unless instructed by your doctor. These medicines may hide a fever. Do not become pregnant while taking this medicine. Women should inform their doctor if they wish to become pregnant or think they might be pregnant. There is a potential for serious side effects to an unborn child. Talk to  your health care professional or pharmacist for more information. Do not breast-feed an infant while taking this medicine. What side effects may I notice from receiving this medicine? Side effects that you should report to your doctor or health care professional as soon as possible: -allergic reactions like skin rash, itching or hives, swelling of the face, lips, or tongue -signs of infection - fever or chills, cough, sore throat, pain or difficulty passing urine -signs of decreased platelets or bleeding - bruising, pinpoint red spots on the skin, black, tarry stools, nosebleeds -signs of decreased red blood cells - unusually weak or tired, fainting spells, lightheadedness -breathing problems -changes in hearing -changes in vision -chest pain -high blood pressure -low blood counts - This drug may decrease the number of white blood cells, red blood cells and platelets. You may be at increased risk for infections and bleeding. -nausea and vomiting -pain, swelling, redness or irritation at the injection site -pain, tingling, numbness in the hands or feet -problems with balance, talking, walking -trouble passing urine or change in the amount of urine Side effects that usually do not require medical attention (report to your doctor or health care professional if they continue or are bothersome): -hair loss -loss of appetite -metallic taste in the mouth or changes in taste This list may not describe all possible side effects. Call your doctor for medical advice about side effects. You may report side effects to FDA at 1-800-FDA-1088. Where should I keep my medicine? This drug is given in a hospital or clinic and will not be stored at home. NOTE: This sheet is a summary. It may not cover all possible information. If you have questions about this medicine, talk to your doctor, pharmacist, or health care provider.  2019 Elsevier/Gold Standard (2008-02-03 14:38:05)

## 2018-12-02 NOTE — Progress Notes (Signed)
Pt on first time taxol. After 5 minutes, Pt blood pressure 90/65. Pt denies feeling any different, dizziness, SOB. Taxol paused and Sandi Mealy, PA notified.  BP recheck completed with Sandi Mealy, PA at bedside. Per Sandi Mealy, PA, ok to resume taxol.  Will continue to monitor patient.

## 2018-12-03 DIAGNOSIS — C3411 Malignant neoplasm of upper lobe, right bronchus or lung: Secondary | ICD-10-CM | POA: Diagnosis not present

## 2018-12-03 DIAGNOSIS — Z510 Encounter for antineoplastic radiation therapy: Secondary | ICD-10-CM | POA: Diagnosis not present

## 2018-12-04 DIAGNOSIS — C3411 Malignant neoplasm of upper lobe, right bronchus or lung: Secondary | ICD-10-CM | POA: Diagnosis not present

## 2018-12-04 DIAGNOSIS — Z510 Encounter for antineoplastic radiation therapy: Secondary | ICD-10-CM | POA: Diagnosis not present

## 2018-12-05 ENCOUNTER — Telehealth: Payer: Self-pay | Admitting: *Deleted

## 2018-12-05 DIAGNOSIS — C3411 Malignant neoplasm of upper lobe, right bronchus or lung: Secondary | ICD-10-CM | POA: Diagnosis not present

## 2018-12-05 DIAGNOSIS — Z510 Encounter for antineoplastic radiation therapy: Secondary | ICD-10-CM | POA: Diagnosis not present

## 2018-12-05 NOTE — Telephone Encounter (Signed)
-----   Message from Teodoro Spray, RN sent at 12/03/2018  8:24 AM EST ----- Regarding: Dr. Julien Nordmann pt--first time chemo Dr. Julien Nordmann pt. First time taxol/carbo. Pt tolerated well.  Thank you.

## 2018-12-05 NOTE — Telephone Encounter (Signed)
TCT patient to follow up with him after his 1st treatment of taxol/carbo on 12/03/18. Spoke with pt. He states he feels pretty. Denies fever, chills, n/v/d.  Reinforced the need for good hydration and eating habits.  He is aware of his upcoming appts.Marland Kitchen

## 2018-12-08 ENCOUNTER — Inpatient Hospital Stay: Payer: Medicare Other

## 2018-12-08 ENCOUNTER — Ambulatory Visit: Payer: Self-pay | Admitting: Nurse Practitioner

## 2018-12-08 ENCOUNTER — Inpatient Hospital Stay (HOSPITAL_BASED_OUTPATIENT_CLINIC_OR_DEPARTMENT_OTHER): Payer: Medicare Other | Admitting: Oncology

## 2018-12-08 ENCOUNTER — Other Ambulatory Visit: Payer: Self-pay

## 2018-12-08 ENCOUNTER — Encounter: Payer: Self-pay | Admitting: Oncology

## 2018-12-08 VITALS — BP 137/76 | HR 73 | Temp 97.6°F | Resp 17 | Ht 73.0 in | Wt 304.3 lb

## 2018-12-08 DIAGNOSIS — C771 Secondary and unspecified malignant neoplasm of intrathoracic lymph nodes: Secondary | ICD-10-CM | POA: Diagnosis not present

## 2018-12-08 DIAGNOSIS — M542 Cervicalgia: Secondary | ICD-10-CM | POA: Diagnosis not present

## 2018-12-08 DIAGNOSIS — G8929 Other chronic pain: Secondary | ICD-10-CM | POA: Diagnosis not present

## 2018-12-08 DIAGNOSIS — C3411 Malignant neoplasm of upper lobe, right bronchus or lung: Secondary | ICD-10-CM | POA: Diagnosis not present

## 2018-12-08 DIAGNOSIS — C3491 Malignant neoplasm of unspecified part of right bronchus or lung: Secondary | ICD-10-CM

## 2018-12-08 DIAGNOSIS — Z510 Encounter for antineoplastic radiation therapy: Secondary | ICD-10-CM | POA: Diagnosis not present

## 2018-12-08 DIAGNOSIS — Z5111 Encounter for antineoplastic chemotherapy: Secondary | ICD-10-CM

## 2018-12-08 DIAGNOSIS — G620 Drug-induced polyneuropathy: Secondary | ICD-10-CM | POA: Diagnosis not present

## 2018-12-08 DIAGNOSIS — I10 Essential (primary) hypertension: Secondary | ICD-10-CM | POA: Diagnosis not present

## 2018-12-08 DIAGNOSIS — R200 Anesthesia of skin: Secondary | ICD-10-CM | POA: Diagnosis not present

## 2018-12-08 DIAGNOSIS — R7303 Prediabetes: Secondary | ICD-10-CM | POA: Diagnosis not present

## 2018-12-08 DIAGNOSIS — K118 Other diseases of salivary glands: Secondary | ICD-10-CM | POA: Diagnosis not present

## 2018-12-08 DIAGNOSIS — Z87891 Personal history of nicotine dependence: Secondary | ICD-10-CM | POA: Diagnosis not present

## 2018-12-08 DIAGNOSIS — D72820 Lymphocytosis (symptomatic): Secondary | ICD-10-CM

## 2018-12-08 DIAGNOSIS — M545 Low back pain: Secondary | ICD-10-CM

## 2018-12-08 DIAGNOSIS — Z95828 Presence of other vascular implants and grafts: Secondary | ICD-10-CM

## 2018-12-08 LAB — CMP (CANCER CENTER ONLY)
ALT: 27 U/L (ref 0–44)
AST: 24 U/L (ref 15–41)
Albumin: 3.4 g/dL — ABNORMAL LOW (ref 3.5–5.0)
Alkaline Phosphatase: 101 U/L (ref 38–126)
Anion gap: 7 (ref 5–15)
BUN: 14 mg/dL (ref 8–23)
CO2: 29 mmol/L (ref 22–32)
Calcium: 9 mg/dL (ref 8.9–10.3)
Chloride: 107 mmol/L (ref 98–111)
Creatinine: 0.89 mg/dL (ref 0.61–1.24)
GFR, Est AFR Am: >60 mL/min (ref >60)
GFR, Estimated: >60 mL/min (ref >60)
Glucose, Bld: 108 mg/dL — ABNORMAL HIGH (ref 70–99)
Potassium: 4.1 mmol/L (ref 3.5–5.1)
Sodium: 143 mmol/L (ref 135–145)
Total Bilirubin: 0.4 mg/dL (ref 0.3–1.2)
Total Protein: 6.8 g/dL (ref 6.5–8.1)

## 2018-12-08 LAB — CBC WITH DIFFERENTIAL (CANCER CENTER ONLY)
Abs Immature Granulocytes: 0.02 10*3/uL (ref 0.00–0.07)
Basophils Absolute: 0 10*3/uL (ref 0.0–0.1)
Basophils Relative: 1 %
Eosinophils Absolute: 0.3 10*3/uL (ref 0.0–0.5)
Eosinophils Relative: 5 %
HCT: 37.5 % — ABNORMAL LOW (ref 39.0–52.0)
Hemoglobin: 11.9 g/dL — ABNORMAL LOW (ref 13.0–17.0)
Immature Granulocytes: 0 %
Lymphocytes Relative: 24 %
Lymphs Abs: 1.5 10*3/uL (ref 0.7–4.0)
MCH: 29.3 pg (ref 26.0–34.0)
MCHC: 31.7 g/dL (ref 30.0–36.0)
MCV: 92.4 fL (ref 80.0–100.0)
Monocytes Absolute: 0.4 10*3/uL (ref 0.1–1.0)
Monocytes Relative: 6 %
Neutro Abs: 4.2 10*3/uL (ref 1.7–7.7)
Neutrophils Relative %: 64 %
Platelet Count: 233 10*3/uL (ref 150–400)
RBC: 4.06 MIL/uL — ABNORMAL LOW (ref 4.22–5.81)
RDW: 13.3 % (ref 11.5–15.5)
WBC Count: 6.5 10*3/uL (ref 4.0–10.5)
nRBC: 0 % (ref 0.0–0.2)

## 2018-12-08 MED ORDER — SODIUM CHLORIDE 0.9% FLUSH
10.0000 mL | Freq: Once | INTRAVENOUS | Status: AC
  Administered 2018-12-08: 10 mL
  Filled 2018-12-08: qty 10

## 2018-12-08 MED ORDER — FAMOTIDINE IN NACL 20-0.9 MG/50ML-% IV SOLN
INTRAVENOUS | Status: AC
  Filled 2018-12-08: qty 50

## 2018-12-08 MED ORDER — FAMOTIDINE IN NACL 20-0.9 MG/50ML-% IV SOLN
20.0000 mg | Freq: Once | INTRAVENOUS | Status: AC
  Administered 2018-12-08: 20 mg via INTRAVENOUS

## 2018-12-08 MED ORDER — SODIUM CHLORIDE 0.9 % IV SOLN
Freq: Once | INTRAVENOUS | Status: AC
  Administered 2018-12-08: 13:00:00 via INTRAVENOUS
  Filled 2018-12-08: qty 250

## 2018-12-08 MED ORDER — HEPARIN SOD (PORK) LOCK FLUSH 100 UNIT/ML IV SOLN
500.0000 [IU] | Freq: Once | INTRAVENOUS | Status: AC | PRN
  Administered 2018-12-08: 500 [IU]
  Filled 2018-12-08: qty 5

## 2018-12-08 MED ORDER — PALONOSETRON HCL INJECTION 0.25 MG/5ML
0.2500 mg | Freq: Once | INTRAVENOUS | Status: AC
  Administered 2018-12-08: 0.25 mg via INTRAVENOUS

## 2018-12-08 MED ORDER — SODIUM CHLORIDE 0.9 % IV SOLN
45.0000 mg/m2 | Freq: Once | INTRAVENOUS | Status: AC
  Administered 2018-12-08: 120 mg via INTRAVENOUS
  Filled 2018-12-08: qty 20

## 2018-12-08 MED ORDER — DIPHENHYDRAMINE HCL 50 MG/ML IJ SOLN
50.0000 mg | Freq: Once | INTRAMUSCULAR | Status: AC
  Administered 2018-12-08: 50 mg via INTRAVENOUS

## 2018-12-08 MED ORDER — SODIUM CHLORIDE 0.9 % IV SOLN
20.0000 mg | Freq: Once | INTRAVENOUS | Status: AC
  Administered 2018-12-08: 20 mg via INTRAVENOUS
  Filled 2018-12-08: qty 2

## 2018-12-08 MED ORDER — PALONOSETRON HCL INJECTION 0.25 MG/5ML
INTRAVENOUS | Status: AC
  Filled 2018-12-08: qty 5

## 2018-12-08 MED ORDER — DIPHENHYDRAMINE HCL 50 MG/ML IJ SOLN
INTRAMUSCULAR | Status: AC
  Filled 2018-12-08: qty 1

## 2018-12-08 MED ORDER — SODIUM CHLORIDE 0.9 % IV SOLN
300.0000 mg | Freq: Once | INTRAVENOUS | Status: AC
  Administered 2018-12-08: 300 mg via INTRAVENOUS
  Filled 2018-12-08: qty 30

## 2018-12-08 MED ORDER — SODIUM CHLORIDE 0.9% FLUSH
10.0000 mL | INTRAVENOUS | Status: DC | PRN
  Administered 2018-12-08: 10 mL
  Filled 2018-12-08: qty 10

## 2018-12-08 NOTE — Progress Notes (Signed)
[facility] OFFICE PROGRESS NOTE  [name], MD [place] [address]  DIAGNOSIS: stage IIIa (T2b, N2, M0) non-small cell lung cancer, squamous cell carcinoma presented with right upper lobe lung mass in addition to right paratracheal lymphadenopathy diagnosed in December 2019.  The patient also has a hypermetabolic lesion in the left parotid gland that need further evaluation.  PRIOR THERAPY: None  CURRENT THERAPY: concurrent chemoradiation with weekly carboplatin for AUC of 2 and paclitaxel 45 mg/M2.  First dose started on 12/02/2018. Status post 1 cycle. The patient is receiving radiation closer to home in [place], [place].  INTERVAL HISTORY: Alexander Hatfield. 63 y.o. male returns for routine follow-up visit accompanied by his wife and sister.  The patient is feeling fine today and has no specific complaints except for his baseline back pain and peripheral neuropathy.  The patient tolerated his first cycle of chemotherapy well overall.  He denies fevers and chills.  Denies chest pain, shortness of breath, cough, hemoptysis.  Denies nausea, vomiting, constipation, diarrhea.  Denies recent weight loss or night sweats.  He is receiving his radiation in [place], [place].  The patient is here for evaluation prior to cycle #2 of his chemotherapy.  MEDICAL HISTORY: Past Medical History:  Diagnosis Date  . Arthritis   . Chronic back pain   . Chronic back pain   . Headache   . HOH (hard of hearing)   . Hypertension   . Lung mass   . Pre-diabetes   . Sleep apnea    wears cpap  . Wears glasses   . Wears partial dentures     ALLERGIES:  is allergic to iohexol.  MEDICATIONS:  Current Outpatient Medications  Medication Sig Dispense Refill  . amLODipine (NORVASC) 10 MG tablet Take 10 mg by mouth daily.     . cyclobenzaprine (FLEXERIL) 10 MG tablet Take 1 tablet (10 mg total) by mouth 3 (three) times daily as needed for muscle spasms. 30 tablet 0   . furosemide (LASIX) 20 MG tablet Take 20 mg by mouth daily.     Marland Kitchen gabapentin (NEURONTIN) 300 MG capsule Take 600 mg by mouth 3 (three) times daily.     Marland Kitchen losartan (COZAAR) 100 MG tablet Take 100 mg by mouth daily.     . meloxicam (MOBIC) 15 MG tablet Take 15 mg by mouth daily.     Marland Kitchen oxyCODONE (ROXICODONE) 5 MG immediate release tablet Take 1 tablet (5 mg total) by mouth every 6 (six) hours as needed. 20 tablet 0  . potassium chloride SA (K-DUR,KLOR-CON) 20 MEQ tablet Take 20 mEq by mouth every evening.     . baclofen (LIORESAL) 20 MG tablet Take 20 mg by mouth 3 (three) times daily as needed for muscle spasms.     Marland Kitchen lidocaine-prilocaine (EMLA) cream Apply 1 application topically as needed. (Patient not taking: Reported on 12/08/2018) 30 g 0  . oxycodone (ROXICODONE) 30 MG immediate release tablet Take 30 mg by mouth every 8 (eight) hours.     . prochlorperazine (COMPAZINE) 10 MG tablet Take 1 tablet (10 mg total) by mouth every 6 (six) hours as needed for nausea or vomiting. (Patient not taking: Reported on 12/08/2018) 30 tablet 0  . pseudoephedrine (SUDAFED) 120 MG 12 hr tablet Take 120 mg by mouth 2 (two) times daily as needed for congestion.     No current facility-administered medications for this visit.     SURGICAL HISTORY:  Past Surgical History:  Procedure Laterality Date  . ANTERIOR CERVICAL DECOMP/DISCECTOMY FUSION N/A 10/27/2018   Procedure: ANTERIOR CERVICAL THREE-FOUR, FOUR-FIVE DECOMPRESSION/DISCECTOMY FUSION TWO LEVELS;  Surgeon: Kary Kos, MD;  Location: [facility];  Service: Neurosurgery;  Laterality: N/A;   ANTERIOR CERVICAL THREE-FOUR, FOUR-FIVE DECOMPRESSION/DISCECTOMY FUSION TWO LEVELS  . BACK SURGERY    . IR IMAGING GUIDED PORT INSERTION  11/27/2018  . MULTIPLE TOOTH EXTRACTIONS    . SPINAL CORD STIMULATOR IMPLANT    . VIDEO BRONCHOSCOPY N/A 10/29/2018   Procedure: VIDEO BRONCHOSCOPY;  Surgeon: Laurin Coder, MD;  Location: [facility];  Service: Thoracic;  Laterality: N/A;   . VIDEO BRONCHOSCOPY WITH ENDOBRONCHIAL ULTRASOUND N/A 10/27/2018   Procedure: VIDEO BRONCHOSCOPY WITH ENDOBRONCHIAL ULTRASOUND;  Surgeon: Laurin Coder, MD;  Location: [facility];  Service: Thoracic;  Laterality: N/A;  . VIDEO BRONCHOSCOPY WITH ENDOBRONCHIAL ULTRASOUND N/A 10/29/2018   Procedure: VIDEO BRONCHOSCOPY WITH ENDOBRONCHIAL ULTRASOUND;  Surgeon: Laurin Coder, MD;  Location: [facility];  Service: Thoracic;  Laterality: N/A;    REVIEW OF SYSTEMS:   Review of Systems  Constitutional: Negative for appetite change, chills, fatigue, fever and unexpected weight change.  HENT:   Negative for mouth sores, nosebleeds, sore throat and trouble swallowing.   Eyes: Negative for eye problems and icterus.  Respiratory: Negative for cough, hemoptysis, shortness of breath and wheezing.   Cardiovascular: Negative for chest pain.  Positive for bilateral lower extremity edema which is unchanged from his baseline. Gastrointestinal: Negative for abdominal pain, constipation, diarrhea, nausea and vomiting.  Genitourinary: Negative for bladder incontinence, difficulty urinating, dysuria, frequency and hematuria.   Musculoskeletal: Positive for back pain. Skin: Negative for itching and rash.  Neurological: Negative for dizziness, extremity weakness, headaches, light-headedness and seizures.  Positive for peripheral neuropathy in his hands and feet which is unchanged from baseline. Hematological: Negative for adenopathy. Does not bruise/bleed easily.  Psychiatric/Behavioral: Negative for confusion, depression and sleep disturbance. The patient is not nervous/anxious.     PHYSICAL EXAMINATION:  Blood pressure 137/76, pulse 73, temperature 97.6 F (36.4 C), temperature source Oral, resp. rate 17, height 6\' 1"  (1.854 m), weight (!) 304 lb 4.8 oz (138 kg), SpO2 98 %.  ECOG PERFORMANCE STATUS: 1 - Symptomatic but completely ambulatory  Physical Exam  Constitutional: Oriented to person, place, and time and  well-developed, well-nourished, and in no distress. No distress.  HENT:  Head: Normocephalic and atraumatic.  Mouth/Throat: Oropharynx is clear and moist. No oropharyngeal exudate.  Eyes: Conjunctivae are normal. Right eye exhibits no discharge. Left eye exhibits no discharge. No scleral icterus.  Neck: Normal range of motion. Neck supple.  Cardiovascular: Normal rate, regular rhythm, normal heart sounds and intact distal pulses.  Trace bilateral lower extremity edema. Pulmonary/Chest: Effort normal and breath sounds normal. No respiratory distress. No wheezes. No rales.  Abdominal: Soft. Bowel sounds are normal. Exhibits no distension and no mass. There is no tenderness.  Musculoskeletal: Normal range of motion. Exhibits no edema.  Lymphadenopathy:    No cervical adenopathy.  Neurological: Alert and oriented to person, place, and time. Exhibits normal muscle tone.  Skin: Skin is warm and dry. No rash noted. Not diaphoretic. No erythema. No pallor.  Psychiatric: Mood, memory and judgment normal.  Vitals reviewed.  LABORATORY DATA: Lab Results  Component Value Date   WBC 6.5 12/08/2018   HGB 11.9 (L) 12/08/2018   HCT 37.5 (L) 12/08/2018   MCV 92.4 12/08/2018   PLT 233 12/08/2018      Chemistry      Component  Value Date/Time   NA 143 12/08/2018 1045   K 4.1 12/08/2018 1045   CL 107 12/08/2018 1045   CO2 29 12/08/2018 1045   BUN 14 12/08/2018 1045   CREATININE 0.89 12/08/2018 1045      Component Value Date/Time   CALCIUM 9.0 12/08/2018 1045   ALKPHOS 101 12/08/2018 1045   AST 24 12/08/2018 1045   ALT 27 12/08/2018 1045   BILITOT 0.4 12/08/2018 1045       RADIOGRAPHIC STUDIES:  Ct Head Wo Contrast  Result Date: 11/27/2018 CLINICAL DATA:  Recent diagnosis of non-small cell carcinoma of the right lung. Recent anterior cervical discectomy and fusion. Left-sided weakness. EXAM: CT HEAD WITHOUT CONTRAST TECHNIQUE: Contiguous axial images were obtained from the base of the  skull through the vertex without intravenous contrast. COMPARISON:  Head CT 06/30/2017 FINDINGS: Brain: No CT evidence of mass lesion or acute infarction. Mild to moderate chronic small-vessel ischemic changes affect the cerebral hemispheric white matter. No hemorrhage, hydrocephalus or extra-axial collection. Vascular: There is atherosclerotic calcification of the major vessels at the base of the brain. Skull: Negative Sinuses/Orbits: Clear/normal Other: None IMPRESSION: No acute finding. No evidence of metastatic disease. Chronic small-vessel ischemic changes of the cerebral hemispheric white matter. Electronically Signed   By: Nelson Chimes M.D.   On: 11/27/2018 07:37   [place] Fna Soft Tissue  Result Date: 12/01/2018 INDICATION: 63 year old with right lung cancer and hypermetabolic nodule in the left upper neck on PET-CT. Plan for ultrasound-guided FNA of this hypermetabolic nodule. EXAM: ULTRASOUND-GUIDED FINE NEEDLE ASPIRATION OF LEFT CERVICAL LYMPH NODE MEDICATIONS: None. ANESTHESIA/SEDATION: Moderate (conscious) sedation was employed during this procedure. A total of Versed 1.5 mg and Fentanyl 75 mcg was administered intravenously. Moderate Sedation Time: 14 minutes. The patient's level of consciousness and vital signs were monitored continuously by radiology nursing throughout the procedure under my direct supervision. FLUOROSCOPY TIME:  None COMPLICATIONS: None immediate. PROCEDURE: Informed written consent was obtained from the patient after a thorough discussion of the procedural risks, benefits and alternatives. All questions were addressed. A timeout was performed prior to the initiation of the procedure. The left upper cervical region around the left submandibular gland and left parotid gland was thoroughly evaluated with ultrasound. There is no evidence for a salivary gland tumor. There are few prominent but normal appearing lymph nodes in the left upper neck near the mandibular angle. Prominent lymph  node that appears to correspond with the hypermetabolic lymph node on PET-CT was targeted for fine-needle aspiration. The neck was prepped with chlorhexidine and skin was anesthetized with 1% lidocaine. Using ultrasound guidance, 5 fine-needle aspirations were obtained with 25 gauge needles. Bandage placed over the puncture site. FINDINGS: Several small lymph nodes in the left upper neck. No evidence for a parotid or salivary gland lesion. Prominent but normal appearing hypoechoic lymph node was sampled. IMPRESSION: Ultrasound-guided fine needle aspiration of a mildly prominent but normal appearing cervical lymph node in the left upper neck. This lymph node appears to correspond with the hypermetabolic activity on the previous PET-CT but there are several lymph nodes in this area. No evidence for a discrete parotid or salivary gland tumor. This area may require imaging follow-up with a dedicated neck CT with IV contrast or attention on follow-up PET-CT. Electronically Signed   By: Markus Daft M.D.   On: 12/01/2018 17:45   Ir Imaging Guided Port Insertion  Result Date: 11/27/2018 CLINICAL DATA:  Lung carcinoma. Needs durable venous access for chemotherapy regimen. EXAM: TUNNELED PORT  CATHETER PLACEMENT WITH ULTRASOUND AND FLUOROSCOPIC GUIDANCE FLUOROSCOPY TIME:  0.1 minute; 52  uGym2 DAP ANESTHESIA/SEDATION: Intravenous Fentanyl and Versed were administered as conscious sedation during continuous monitoring of the patient's level of consciousness and physiological / cardiorespiratory status by the radiology RN, with a total moderate sedation time of 13 minutes. TECHNIQUE: The procedure, risks, benefits, and alternatives were explained to the patient. Questions regarding the procedure were encouraged and answered. The patient understands and consents to the procedure. As antibiotic prophylaxis, cefazolin 2 g was ordered pre-procedure and administered intravenously within one hour of incision. Patency of the right  IJ vein was confirmed with ultrasound with image documentation. An appropriate skin site was determined. Skin site was marked. Region was prepped using maximum barrier technique including cap and mask, sterile gown, sterile gloves, large sterile sheet, and Chlorhexidine as cutaneous antisepsis. The region was infiltrated locally with 1% lidocaine. Under real-time ultrasound guidance, the right IJ vein was accessed with a 21 gauge micropuncture needle; the needle tip within the vein was confirmed with ultrasound image documentation. Needle was exchanged over a 018 guidewire for transitional dilator which allowed passage of the [facility] wire into the IVC. Over this, the transitional dilator was exchanged for a 5 [place] MPA catheter. A small incision was made on the right anterior chest wall and a subcutaneous pocket fashioned. The power-injectable port was positioned and its catheter tunneled to the right IJ dermatotomy site. The MPA catheter was exchanged over an Amplatz wire for a peel-away sheath, through which the port catheter, which had been trimmed to the appropriate length, was advanced and positioned under fluoroscopy with its tip at the cavoatrial junction. Spot chest radiograph confirms good catheter position and no pneumothorax. The pocket was closed with deep interrupted and subcuticular continuous 3-0 Monocryl sutures. The port was flushed per protocol. The incisions were covered with Dermabond then covered with a sterile dressing. COMPLICATIONS: COMPLICATIONS None immediate IMPRESSION: Technically successful right IJ power-injectable port catheter placement. Ready for routine use. Electronically Signed   By: Lucrezia Europe M.D.   On: 11/27/2018 15:27   PATHOLOGY:  Diagnosis LYMPH NODE, FINE NEEDLE ASPIRATION, LEFT UPPER NECK (SPECIMEN 1 OF 1 COLLECTED 12-01-2018) - LYMPHOID TISSUE PRESENT - NO EXTRINSIC CELL POPULATION IDENTIFIED DAWN BUTLER MD Pathologist, Electronic Signature (Case signed  12/02/2018)  ASSESSMENT/PLAN:  Adenocarcinoma of right lung, stage 3 ([place]) This is a very pleasant 63 year old white male recently diagnosed with a stage IIIa (T2b, N2, M0) non-small cell lung cancer, squamous cell carcinoma presented with right upper lobe lung mass in addition to right paratracheal lymphadenopathy diagnosed in December 2019.  He is currently receiving a course of concurrent chemoradiation with weekly carboplatin for AUC of 2 and paclitaxel 45 mg/M2.  Status post 1 cycle which he tolerated fairly well with no concerning adverse effects. He had a recent CT scan of the brain and biopsy of the left neck lymph node and is here to discuss the results.  The patient was seen with Dr. Julien Nordmann.  CT scan results of the brain were discussed which showed no evidence of metastatic disease.  We also discussed the pathology report which showed no evidence of malignancy.  Recommend for him to continue on his current course of concurrent chemoradiation.  He will proceed with cycle #2 of his chemotherapy today as scheduled.  He will continue radiation in [place]. The patient will follow-up next week for labs and his third cycle of chemotherapy and have a follow-up visit in 2 weeks  for evaluation prior to cycle #4.  He was advised to call immediately if he has any concerning symptoms in the interval.  The patient voices understanding of current disease status and treatment options and is in agreement with the current care plan.  All questions were answered. The patient knows to call the clinic with any problems, questions or concerns. We can certainly see the patient much sooner if necessary.   No orders of the defined types were placed in this encounter.    Mikey Bussing, DNP, AGPCNP-BC, AOCNP 12/08/18   ADDENDUM: Hematology/Oncology Attending: I had a face-to-face encounter with the patient today.  I recommended his care plan.  This is a very pleasant 63 years old white male recently  diagnosed with a stage IIIa non-small cell lung cancer, squamous cell carcinoma.  The patient is currently undergoing a course of concurrent chemoradiation with weekly carboplatin and paclitaxel status post 1 cycle.  He tolerated the first cycle of his chemotherapy fairly well with no significant complaints. He had CT scan of the head performed recently that showed no concerning findings for disease progression.  I discussed the scan results with the patient and his family. He also had fine-needle aspiration of the left parotid gland lesion and it was consistent with lymphocytes and no concerning findings for malignancy. I recommended for the patient to continue his current treatment with concurrent chemoradiation as scheduled and he will proceed with cycle #2 today. We will see the patient back for follow-up visit in 2 weeks for evaluation before starting cycle #4.  He is currently receiving his radiotherapy at [facility] in [facility] close to home. The patient has a lot of issues with his back pain but he is followed by neurosurgery Dr. Carloyn Manner and he is taken care of his condition and pain management. The patient was advised to call immediately if he has any concerning symptoms in the interval.  Disclaimer: This note was dictated with voice recognition software. Similar sounding words can inadvertently be transcribed and may be missed upon review. Eilleen Kempf, MD 12/08/18

## 2018-12-08 NOTE — Assessment & Plan Note (Signed)
This is a very pleasant 63 year old white male recently diagnosed with a stage IIIa (T2b, N2, M0) non-small cell lung cancer, squamous cell carcinoma presented with right upper lobe lung mass in addition to right paratracheal lymphadenopathy diagnosed in December 2019.  He is currently receiving a course of concurrent chemoradiation with weekly carboplatin for AUC of 2 and paclitaxel 45 mg/M2.  Status post 1 cycle which he tolerated fairly well with no concerning adverse effects. He had a recent CT scan of the brain and biopsy of the left neck lymph node and is here to discuss the results.  The patient was seen with Dr. Julien Nordmann.  CT scan results of the brain were discussed which showed no evidence of metastatic disease.  We also discussed the pathology report which showed no evidence of malignancy.  Recommend for him to continue on his current course of concurrent chemoradiation.  He will proceed with cycle #2 of his chemotherapy today as scheduled.  He will continue radiation in [place]. The patient will follow-up next week for labs and his third cycle of chemotherapy and have a follow-up visit in 2 weeks for evaluation prior to cycle #4.  He was advised to call immediately if he has any concerning symptoms in the interval.  The patient voices understanding of current disease status and treatment options and is in agreement with the current care plan.  All questions were answered. The patient knows to call the clinic with any problems, questions or concerns. We can certainly see the patient much sooner if necessary.

## 2018-12-08 NOTE — Patient Instructions (Signed)
   [facility] Discharge Instructions for Patients Receiving Chemotherapy  Today you received the following chemotherapy agents Taxol and Carboplatin   To help prevent nausea and vomiting after your treatment, we encourage you to take your nausea medication as directed.    If you develop nausea and vomiting that is not controlled by your nausea medication, call the clinic.   BELOW ARE SYMPTOMS THAT SHOULD BE REPORTED IMMEDIATELY:  *FEVER GREATER THAN 100.5 F  *CHILLS WITH OR WITHOUT FEVER  NAUSEA AND VOMITING THAT IS NOT CONTROLLED WITH YOUR NAUSEA MEDICATION  *UNUSUAL SHORTNESS OF BREATH  *UNUSUAL BRUISING OR BLEEDING  TENDERNESS IN MOUTH AND THROAT WITH OR WITHOUT PRESENCE OF ULCERS  *URINARY PROBLEMS  *BOWEL PROBLEMS  UNUSUAL RASH Items with * indicate a potential emergency and should be followed up as soon as possible.  Feel free to call the clinic should you have any questions or concerns. The clinic phone number is [phone].  Please show the CHEMO ALERT CARD at check-in to the Emergency Department and triage nurse.   

## 2018-12-09 ENCOUNTER — Encounter: Payer: Self-pay | Admitting: *Deleted

## 2018-12-09 DIAGNOSIS — C3411 Malignant neoplasm of upper lobe, right bronchus or lung: Secondary | ICD-10-CM | POA: Diagnosis not present

## 2018-12-09 DIAGNOSIS — Z510 Encounter for antineoplastic radiation therapy: Secondary | ICD-10-CM | POA: Diagnosis not present

## 2018-12-09 NOTE — Progress Notes (Signed)
Oncology Nurse Navigator Documentation  Oncology Nurse Navigator Flowsheets 12/09/2018  Navigator Location [facility]  Navigator Encounter Type Other/I received a call from [place] with [employer].  Alexander Hatfield schedule her still has not been changed for his Feb 4th appt.  He needs to be seen later here due to his appt in [place].  I completed scheduling message on 11/27/2018.  I called [employer] and appologized for this not being completed.  I completed another scheduling message and will speak to them.  I will also complete a safety portal on issue.  I will update Dr. Julien Nordmann.   Treatment Phase Pre-Tx/Tx Discussion  Barriers/Navigation Needs Coordination of Care  Interventions Coordination of Care  Coordination of Care Other  Acuity Level 2  Time Spent with Patient 30

## 2018-12-09 NOTE — Progress Notes (Signed)
Mr. Munguia was seen in infusion. His BP was noted to be 90/65 after 5 minutes of his first cycle if Taxol. Taxol was paused. The patient was asymptomatic. Taxol was restarted with the patient completing his infusion without issues of concern.   Sandi Mealy, MHS, PA-C

## 2018-12-10 DIAGNOSIS — Z510 Encounter for antineoplastic radiation therapy: Secondary | ICD-10-CM | POA: Diagnosis not present

## 2018-12-10 DIAGNOSIS — C3411 Malignant neoplasm of upper lobe, right bronchus or lung: Secondary | ICD-10-CM | POA: Diagnosis not present

## 2018-12-11 ENCOUNTER — Telehealth: Payer: Self-pay | Admitting: *Deleted

## 2018-12-11 DIAGNOSIS — M542 Cervicalgia: Secondary | ICD-10-CM | POA: Diagnosis not present

## 2018-12-11 DIAGNOSIS — C3411 Malignant neoplasm of upper lobe, right bronchus or lung: Secondary | ICD-10-CM | POA: Diagnosis not present

## 2018-12-11 DIAGNOSIS — Z510 Encounter for antineoplastic radiation therapy: Secondary | ICD-10-CM | POA: Diagnosis not present

## 2018-12-11 NOTE — Telephone Encounter (Signed)
Oncology Nurse Navigator Documentation  Oncology Nurse Navigator Flowsheets 12/11/2018  Navigator Location [facility]  Navigator Encounter Type Telephone/I received a vm message from patient's wife that a medical bill is not being covered by insurance.  I called her back and she updated me that his radiation is not being covered by their insurance.  I updated her that she needs to call [facility] to help with insurance and getting it authorized.   Telephone Incoming Call;Outgoing Call  Patient Visit Type Follow-up  Treatment Phase Treatment  Barriers/Navigation Needs Education  Education Other  Interventions Education  Education Method Verbal  Acuity Level 1  Time Spent with Patient 30

## 2018-12-12 DIAGNOSIS — C3411 Malignant neoplasm of upper lobe, right bronchus or lung: Secondary | ICD-10-CM | POA: Diagnosis not present

## 2018-12-12 DIAGNOSIS — Z510 Encounter for antineoplastic radiation therapy: Secondary | ICD-10-CM | POA: Diagnosis not present

## 2018-12-13 ENCOUNTER — Other Ambulatory Visit: Payer: Self-pay

## 2018-12-13 ENCOUNTER — Ambulatory Visit: Payer: Self-pay

## 2018-12-15 ENCOUNTER — Telehealth: Payer: Self-pay | Admitting: *Deleted

## 2018-12-15 DIAGNOSIS — Z510 Encounter for antineoplastic radiation therapy: Secondary | ICD-10-CM | POA: Diagnosis not present

## 2018-12-15 DIAGNOSIS — Z9221 Personal history of antineoplastic chemotherapy: Secondary | ICD-10-CM | POA: Diagnosis not present

## 2018-12-15 DIAGNOSIS — G894 Chronic pain syndrome: Secondary | ICD-10-CM | POA: Diagnosis not present

## 2018-12-15 DIAGNOSIS — I10 Essential (primary) hypertension: Secondary | ICD-10-CM | POA: Diagnosis not present

## 2018-12-15 DIAGNOSIS — M4722 Other spondylosis with radiculopathy, cervical region: Secondary | ICD-10-CM | POA: Diagnosis not present

## 2018-12-15 DIAGNOSIS — C3411 Malignant neoplasm of upper lobe, right bronchus or lung: Secondary | ICD-10-CM | POA: Diagnosis not present

## 2018-12-15 DIAGNOSIS — Z79899 Other long term (current) drug therapy: Secondary | ICD-10-CM | POA: Diagnosis not present

## 2018-12-15 NOTE — Telephone Encounter (Signed)
Oncology Nurse Navigator Documentation  Oncology Nurse Navigator Flowsheets 12/15/2018  Navigator Location [facility]  Navigator Encounter Type Telephone/I received a message from Alexander Hatfield.  She updated me that Alexander Hatfield was having some nausea.  I called her back but was unable to reach her.  I did leave a phone number to call with help form triage dept here at the cancer center.   Telephone Outgoing Call  Treatment Phase Treatment  Barriers/Navigation Needs Education  Education Other  Interventions Education  Education Method Verbal  Acuity Level 1  Time Spent with Patient 30

## 2018-12-16 ENCOUNTER — Inpatient Hospital Stay: Payer: Medicare Other

## 2018-12-16 ENCOUNTER — Ambulatory Visit: Payer: Self-pay

## 2018-12-16 ENCOUNTER — Inpatient Hospital Stay: Payer: Medicare Other | Attending: Internal Medicine

## 2018-12-16 VITALS — BP 113/70 | HR 72 | Temp 98.3°F | Resp 16

## 2018-12-16 DIAGNOSIS — Z5111 Encounter for antineoplastic chemotherapy: Secondary | ICD-10-CM | POA: Insufficient documentation

## 2018-12-16 DIAGNOSIS — C3491 Malignant neoplasm of unspecified part of right bronchus or lung: Secondary | ICD-10-CM

## 2018-12-16 DIAGNOSIS — C3411 Malignant neoplasm of upper lobe, right bronchus or lung: Secondary | ICD-10-CM | POA: Insufficient documentation

## 2018-12-16 DIAGNOSIS — Z79899 Other long term (current) drug therapy: Secondary | ICD-10-CM | POA: Diagnosis not present

## 2018-12-16 DIAGNOSIS — I10 Essential (primary) hypertension: Secondary | ICD-10-CM | POA: Diagnosis not present

## 2018-12-16 DIAGNOSIS — Z9221 Personal history of antineoplastic chemotherapy: Secondary | ICD-10-CM | POA: Diagnosis not present

## 2018-12-16 DIAGNOSIS — Z95828 Presence of other vascular implants and grafts: Secondary | ICD-10-CM

## 2018-12-16 DIAGNOSIS — Z510 Encounter for antineoplastic radiation therapy: Secondary | ICD-10-CM | POA: Diagnosis not present

## 2018-12-16 LAB — CBC WITH DIFFERENTIAL (CANCER CENTER ONLY)
Abs Immature Granulocytes: 0.08 10*3/uL — ABNORMAL HIGH (ref 0.00–0.07)
Basophils Absolute: 0 10*3/uL (ref 0.0–0.1)
Basophils Relative: 1 %
Eosinophils Absolute: 0.2 10*3/uL (ref 0.0–0.5)
Eosinophils Relative: 3 %
HCT: 37.2 % — ABNORMAL LOW (ref 39.0–52.0)
Hemoglobin: 11.9 g/dL — ABNORMAL LOW (ref 13.0–17.0)
Immature Granulocytes: 1 %
Lymphocytes Relative: 17 %
Lymphs Abs: 1.3 10*3/uL (ref 0.7–4.0)
MCH: 29.5 pg (ref 26.0–34.0)
MCHC: 32 g/dL (ref 30.0–36.0)
MCV: 92.3 fL (ref 80.0–100.0)
Monocytes Absolute: 0.6 10*3/uL (ref 0.1–1.0)
Monocytes Relative: 9 %
Neutro Abs: 5.1 10*3/uL (ref 1.7–7.7)
Neutrophils Relative %: 69 %
Platelet Count: 238 10*3/uL (ref 150–400)
RBC: 4.03 MIL/uL — ABNORMAL LOW (ref 4.22–5.81)
RDW: 13.5 % (ref 11.5–15.5)
WBC Count: 7.3 10*3/uL (ref 4.0–10.5)
nRBC: 0 % (ref 0.0–0.2)

## 2018-12-16 LAB — CMP (CANCER CENTER ONLY)
ALT: 23 U/L (ref 0–44)
AST: 13 U/L — ABNORMAL LOW (ref 15–41)
Albumin: 3.6 g/dL (ref 3.5–5.0)
Alkaline Phosphatase: 100 U/L (ref 38–126)
Anion gap: 7 (ref 5–15)
BUN: 17 mg/dL (ref 8–23)
CO2: 27 mmol/L (ref 22–32)
Calcium: 9.2 mg/dL (ref 8.9–10.3)
Chloride: 106 mmol/L (ref 98–111)
Creatinine: 0.9 mg/dL (ref 0.61–1.24)
GFR, Est AFR Am: >60 mL/min (ref >60)
GFR, Estimated: >60 mL/min (ref >60)
Glucose, Bld: 108 mg/dL — ABNORMAL HIGH (ref 70–99)
Potassium: 4.3 mmol/L (ref 3.5–5.1)
Sodium: 140 mmol/L (ref 135–145)
Total Bilirubin: 0.5 mg/dL (ref 0.3–1.2)
Total Protein: 6.9 g/dL (ref 6.5–8.1)

## 2018-12-16 MED ORDER — HEPARIN SOD (PORK) LOCK FLUSH 100 UNIT/ML IV SOLN
500.0000 [IU] | Freq: Once | INTRAVENOUS | Status: AC | PRN
  Administered 2018-12-16: 500 [IU]
  Filled 2018-12-16: qty 5

## 2018-12-16 MED ORDER — SODIUM CHLORIDE 0.9% FLUSH
10.0000 mL | INTRAVENOUS | Status: DC | PRN
  Administered 2018-12-16: 10 mL
  Filled 2018-12-16: qty 10

## 2018-12-16 MED ORDER — SODIUM CHLORIDE 0.9 % IV SOLN
45.0000 mg/m2 | Freq: Once | INTRAVENOUS | Status: AC
  Administered 2018-12-16: 120 mg via INTRAVENOUS
  Filled 2018-12-16: qty 20

## 2018-12-16 MED ORDER — DIPHENHYDRAMINE HCL 50 MG/ML IJ SOLN
50.0000 mg | Freq: Once | INTRAMUSCULAR | Status: AC
  Administered 2018-12-16: 50 mg via INTRAVENOUS

## 2018-12-16 MED ORDER — PALONOSETRON HCL INJECTION 0.25 MG/5ML
INTRAVENOUS | Status: AC
  Filled 2018-12-16: qty 5

## 2018-12-16 MED ORDER — SODIUM CHLORIDE 0.9 % IV SOLN
20.0000 mg | Freq: Once | INTRAVENOUS | Status: AC
  Administered 2018-12-16: 20 mg via INTRAVENOUS
  Filled 2018-12-16: qty 2

## 2018-12-16 MED ORDER — FAMOTIDINE IN NACL 20-0.9 MG/50ML-% IV SOLN
INTRAVENOUS | Status: AC
  Filled 2018-12-16: qty 50

## 2018-12-16 MED ORDER — SODIUM CHLORIDE 0.9% FLUSH
10.0000 mL | Freq: Once | INTRAVENOUS | Status: AC
  Administered 2018-12-16: 10 mL
  Filled 2018-12-16: qty 10

## 2018-12-16 MED ORDER — PALONOSETRON HCL INJECTION 0.25 MG/5ML
0.2500 mg | Freq: Once | INTRAVENOUS | Status: AC
  Administered 2018-12-16: 0.25 mg via INTRAVENOUS

## 2018-12-16 MED ORDER — SODIUM CHLORIDE 0.9 % IV SOLN
300.0000 mg | Freq: Once | INTRAVENOUS | Status: AC
  Administered 2018-12-16: 300 mg via INTRAVENOUS
  Filled 2018-12-16: qty 30

## 2018-12-16 MED ORDER — FAMOTIDINE IN NACL 20-0.9 MG/50ML-% IV SOLN
20.0000 mg | Freq: Once | INTRAVENOUS | Status: AC
  Administered 2018-12-16: 20 mg via INTRAVENOUS

## 2018-12-16 MED ORDER — DIPHENHYDRAMINE HCL 50 MG/ML IJ SOLN
INTRAMUSCULAR | Status: AC
  Filled 2018-12-16: qty 1

## 2018-12-16 MED ORDER — SODIUM CHLORIDE 0.9 % IV SOLN
Freq: Once | INTRAVENOUS | Status: AC
  Administered 2018-12-16: 10:00:00 via INTRAVENOUS
  Filled 2018-12-16: qty 250

## 2018-12-16 NOTE — Patient Instructions (Signed)
   [facility] Discharge Instructions for Patients Receiving Chemotherapy  Today you received the following chemotherapy agents Taxol and Carboplatin   To help prevent nausea and vomiting after your treatment, we encourage you to take your nausea medication as directed.    If you develop nausea and vomiting that is not controlled by your nausea medication, call the clinic.   BELOW ARE SYMPTOMS THAT SHOULD BE REPORTED IMMEDIATELY:  *FEVER GREATER THAN 100.5 F  *CHILLS WITH OR WITHOUT FEVER  NAUSEA AND VOMITING THAT IS NOT CONTROLLED WITH YOUR NAUSEA MEDICATION  *UNUSUAL SHORTNESS OF BREATH  *UNUSUAL BRUISING OR BLEEDING  TENDERNESS IN MOUTH AND THROAT WITH OR WITHOUT PRESENCE OF ULCERS  *URINARY PROBLEMS  *BOWEL PROBLEMS  UNUSUAL RASH Items with * indicate a potential emergency and should be followed up as soon as possible.  Feel free to call the clinic should you have any questions or concerns. The clinic phone number is [phone].  Please show the CHEMO ALERT CARD at check-in to the Emergency Department and triage nurse.   

## 2018-12-17 DIAGNOSIS — Z79899 Other long term (current) drug therapy: Secondary | ICD-10-CM | POA: Diagnosis not present

## 2018-12-17 DIAGNOSIS — Z9221 Personal history of antineoplastic chemotherapy: Secondary | ICD-10-CM | POA: Diagnosis not present

## 2018-12-17 DIAGNOSIS — Z510 Encounter for antineoplastic radiation therapy: Secondary | ICD-10-CM | POA: Diagnosis not present

## 2018-12-17 DIAGNOSIS — I10 Essential (primary) hypertension: Secondary | ICD-10-CM | POA: Diagnosis not present

## 2018-12-17 DIAGNOSIS — C3411 Malignant neoplasm of upper lobe, right bronchus or lung: Secondary | ICD-10-CM | POA: Diagnosis not present

## 2018-12-18 DIAGNOSIS — Z510 Encounter for antineoplastic radiation therapy: Secondary | ICD-10-CM | POA: Diagnosis not present

## 2018-12-18 DIAGNOSIS — C3411 Malignant neoplasm of upper lobe, right bronchus or lung: Secondary | ICD-10-CM | POA: Diagnosis not present

## 2018-12-18 DIAGNOSIS — Z79899 Other long term (current) drug therapy: Secondary | ICD-10-CM | POA: Diagnosis not present

## 2018-12-18 DIAGNOSIS — Z9221 Personal history of antineoplastic chemotherapy: Secondary | ICD-10-CM | POA: Diagnosis not present

## 2018-12-18 DIAGNOSIS — I10 Essential (primary) hypertension: Secondary | ICD-10-CM | POA: Diagnosis not present

## 2018-12-19 DIAGNOSIS — Z79899 Other long term (current) drug therapy: Secondary | ICD-10-CM | POA: Diagnosis not present

## 2018-12-19 DIAGNOSIS — Z9221 Personal history of antineoplastic chemotherapy: Secondary | ICD-10-CM | POA: Diagnosis not present

## 2018-12-19 DIAGNOSIS — C3411 Malignant neoplasm of upper lobe, right bronchus or lung: Secondary | ICD-10-CM | POA: Diagnosis not present

## 2018-12-19 DIAGNOSIS — I10 Essential (primary) hypertension: Secondary | ICD-10-CM | POA: Diagnosis not present

## 2018-12-19 DIAGNOSIS — Z510 Encounter for antineoplastic radiation therapy: Secondary | ICD-10-CM | POA: Diagnosis not present

## 2018-12-22 ENCOUNTER — Inpatient Hospital Stay: Payer: Medicare Other

## 2018-12-22 ENCOUNTER — Encounter: Payer: Self-pay | Admitting: Internal Medicine

## 2018-12-22 ENCOUNTER — Other Ambulatory Visit: Payer: Self-pay

## 2018-12-22 ENCOUNTER — Ambulatory Visit: Payer: Self-pay

## 2018-12-22 ENCOUNTER — Inpatient Hospital Stay (HOSPITAL_BASED_OUTPATIENT_CLINIC_OR_DEPARTMENT_OTHER): Payer: Medicare Other | Admitting: Internal Medicine

## 2018-12-22 VITALS — BP 126/70 | HR 85 | Temp 98.6°F | Resp 18 | Ht 73.0 in | Wt 300.9 lb

## 2018-12-22 DIAGNOSIS — C3491 Malignant neoplasm of unspecified part of right bronchus or lung: Secondary | ICD-10-CM

## 2018-12-22 DIAGNOSIS — C3411 Malignant neoplasm of upper lobe, right bronchus or lung: Secondary | ICD-10-CM

## 2018-12-22 DIAGNOSIS — R1310 Dysphagia, unspecified: Secondary | ICD-10-CM | POA: Diagnosis not present

## 2018-12-22 DIAGNOSIS — Z79899 Other long term (current) drug therapy: Secondary | ICD-10-CM | POA: Diagnosis not present

## 2018-12-22 DIAGNOSIS — Z5111 Encounter for antineoplastic chemotherapy: Secondary | ICD-10-CM

## 2018-12-22 DIAGNOSIS — I10 Essential (primary) hypertension: Secondary | ICD-10-CM

## 2018-12-22 DIAGNOSIS — Z95828 Presence of other vascular implants and grafts: Secondary | ICD-10-CM

## 2018-12-22 DIAGNOSIS — Z510 Encounter for antineoplastic radiation therapy: Secondary | ICD-10-CM | POA: Diagnosis not present

## 2018-12-22 DIAGNOSIS — Z9221 Personal history of antineoplastic chemotherapy: Secondary | ICD-10-CM | POA: Diagnosis not present

## 2018-12-22 LAB — CBC WITH DIFFERENTIAL (CANCER CENTER ONLY)
Abs Immature Granulocytes: 0.07 10*3/uL (ref 0.00–0.07)
Basophils Absolute: 0.1 10*3/uL (ref 0.0–0.1)
Basophils Relative: 1 %
Eosinophils Absolute: 0.1 10*3/uL (ref 0.0–0.5)
Eosinophils Relative: 2 %
HCT: 37.1 % — ABNORMAL LOW (ref 39.0–52.0)
Hemoglobin: 12.1 g/dL — ABNORMAL LOW (ref 13.0–17.0)
Immature Granulocytes: 1 %
Lymphocytes Relative: 13 %
Lymphs Abs: 0.8 10*3/uL (ref 0.7–4.0)
MCH: 29.9 pg (ref 26.0–34.0)
MCHC: 32.6 g/dL (ref 30.0–36.0)
MCV: 91.6 fL (ref 80.0–100.0)
Monocytes Absolute: 0.5 10*3/uL (ref 0.1–1.0)
Monocytes Relative: 8 %
Neutro Abs: 4.9 10*3/uL (ref 1.7–7.7)
Neutrophils Relative %: 75 %
Platelet Count: 222 10*3/uL (ref 150–400)
RBC: 4.05 MIL/uL — ABNORMAL LOW (ref 4.22–5.81)
RDW: 14.2 % (ref 11.5–15.5)
WBC Count: 6.5 10*3/uL (ref 4.0–10.5)
nRBC: 0 % (ref 0.0–0.2)

## 2018-12-22 LAB — CMP (CANCER CENTER ONLY)
ALT: 27 U/L (ref 0–44)
AST: 20 U/L (ref 15–41)
Albumin: 3.4 g/dL — ABNORMAL LOW (ref 3.5–5.0)
Alkaline Phosphatase: 92 U/L (ref 38–126)
Anion gap: 7 (ref 5–15)
BUN: 15 mg/dL (ref 8–23)
CO2: 28 mmol/L (ref 22–32)
Calcium: 9.3 mg/dL (ref 8.9–10.3)
Chloride: 106 mmol/L (ref 98–111)
Creatinine: 0.83 mg/dL (ref 0.61–1.24)
GFR, Est AFR Am: >60 mL/min (ref >60)
GFR, Estimated: >60 mL/min (ref >60)
Glucose, Bld: 101 mg/dL — ABNORMAL HIGH (ref 70–99)
Potassium: 4.6 mmol/L (ref 3.5–5.1)
Sodium: 141 mmol/L (ref 135–145)
Total Bilirubin: 0.7 mg/dL (ref 0.3–1.2)
Total Protein: 6.8 g/dL (ref 6.5–8.1)

## 2018-12-22 MED ORDER — SODIUM CHLORIDE 0.9% FLUSH
10.0000 mL | Freq: Once | INTRAVENOUS | Status: AC
  Administered 2018-12-22: 10 mL
  Filled 2018-12-22: qty 10

## 2018-12-22 MED ORDER — DIPHENHYDRAMINE HCL 50 MG/ML IJ SOLN
50.0000 mg | Freq: Once | INTRAMUSCULAR | Status: AC
  Administered 2018-12-22: 50 mg via INTRAVENOUS

## 2018-12-22 MED ORDER — HEPARIN SOD (PORK) LOCK FLUSH 100 UNIT/ML IV SOLN
500.0000 [IU] | Freq: Once | INTRAVENOUS | Status: AC | PRN
  Administered 2018-12-22: 500 [IU]
  Filled 2018-12-22: qty 5

## 2018-12-22 MED ORDER — SODIUM CHLORIDE 0.9 % IV SOLN
20.0000 mg | Freq: Once | INTRAVENOUS | Status: AC
  Administered 2018-12-22: 20 mg via INTRAVENOUS
  Filled 2018-12-22: qty 2

## 2018-12-22 MED ORDER — SODIUM CHLORIDE 0.9 % IV SOLN
300.0000 mg | Freq: Once | INTRAVENOUS | Status: AC
  Administered 2018-12-22: 300 mg via INTRAVENOUS
  Filled 2018-12-22: qty 30

## 2018-12-22 MED ORDER — PALONOSETRON HCL INJECTION 0.25 MG/5ML
INTRAVENOUS | Status: AC
  Filled 2018-12-22: qty 5

## 2018-12-22 MED ORDER — SODIUM CHLORIDE 0.9 % IV SOLN
Freq: Once | INTRAVENOUS | Status: DC
  Filled 2018-12-22: qty 250

## 2018-12-22 MED ORDER — FAMOTIDINE IN NACL 20-0.9 MG/50ML-% IV SOLN
INTRAVENOUS | Status: AC
  Filled 2018-12-22: qty 50

## 2018-12-22 MED ORDER — DIPHENHYDRAMINE HCL 50 MG/ML IJ SOLN
INTRAMUSCULAR | Status: AC
  Filled 2018-12-22: qty 1

## 2018-12-22 MED ORDER — SODIUM CHLORIDE 0.9% FLUSH
10.0000 mL | INTRAVENOUS | Status: DC | PRN
  Administered 2018-12-22: 10 mL
  Filled 2018-12-22: qty 10

## 2018-12-22 MED ORDER — PALONOSETRON HCL INJECTION 0.25 MG/5ML
0.2500 mg | Freq: Once | INTRAVENOUS | Status: AC
  Administered 2018-12-22: 0.25 mg via INTRAVENOUS

## 2018-12-22 MED ORDER — FAMOTIDINE IN NACL 20-0.9 MG/50ML-% IV SOLN
20.0000 mg | Freq: Once | INTRAVENOUS | Status: AC
  Administered 2018-12-22: 20 mg via INTRAVENOUS

## 2018-12-22 MED ORDER — SODIUM CHLORIDE 0.9 % IV SOLN
45.0000 mg/m2 | Freq: Once | INTRAVENOUS | Status: AC
  Administered 2018-12-22: 120 mg via INTRAVENOUS
  Filled 2018-12-22: qty 20

## 2018-12-22 MED ORDER — SODIUM CHLORIDE 0.9 % IV SOLN: 20.0000 mg | Freq: Once | INTRAVENOUS | Status: DC

## 2018-12-22 NOTE — Telephone Encounter (Signed)
error 

## 2018-12-22 NOTE — Progress Notes (Signed)
[facility] [phone]   [phone]  OFFICE PROGRESS NOTE  Alexander Du, MD [place] [address]  DIAGNOSIS: stage IIIa (T2b, N2, M0) non-small cell lung cancer, squamous cell carcinoma presented with right upper lobe lung mass in addition to right paratracheal lymphadenopathy diagnosed in December 2019. The patient also has a hypermetabolic lesion in the left parotid gland that need further evaluation.  PRIOR THERAPY: None  CURRENT THERAPY: concurrent chemoradiation with weekly carboplatin for AUC of 2 and paclitaxel 45 mg/M2.  First dose started on 12/02/2018. Status post 3 cycles. The patient is receiving radiation closer to home in [place], [place].  INTERVAL HISTORY: Landan Fedie. 63 y.o. male returns to the clinic today for follow-up visit accompanied by his brother-in-law.  The patient is feeling fine today with no concerning complaints except for mild odynophagia.  He was given prescription for Carafate by radiation oncology earlier today.  He denied having any chest pain, shortness of breath, cough or hemoptysis.  He denied having any fever or chills.  He has no nausea, vomiting, diarrhea or constipation.  He is tolerating his current concurrent chemoradiation fairly well.  The patient is here today for evaluation before starting cycle #4.  MEDICAL HISTORY: Past Medical History:  Diagnosis Date  . Arthritis   . Chronic back pain   . Chronic back pain   . Headache   . HOH (hard of hearing)   . Hypertension   . Lung mass   . Pre-diabetes   . Sleep apnea    wears cpap  . Wears glasses   . Wears partial dentures     ALLERGIES:  is allergic to iohexol.  MEDICATIONS:  Current Outpatient Medications  Medication Sig Dispense Refill  . amLODipine (NORVASC) 10 MG tablet Take 10 mg by mouth daily.     . baclofen (LIORESAL) 20 MG tablet Take 20 mg by mouth 3 (three) times daily as needed for muscle  spasms.     . cyclobenzaprine (FLEXERIL) 10 MG tablet Take 1 tablet (10 mg total) by mouth 3 (three) times daily as needed for muscle spasms. 30 tablet 0  . furosemide (LASIX) 20 MG tablet Take 20 mg by mouth daily.     Marland Kitchen gabapentin (NEURONTIN) 300 MG capsule Take 600 mg by mouth 3 (three) times daily.     Marland Kitchen lidocaine-prilocaine (EMLA) cream Apply 1 application topically as needed. 30 g 0  . losartan (COZAAR) 100 MG tablet Take 100 mg by mouth daily.     . magic mouthwash w/lidocaine SOLN Take by mouth.    . meloxicam (MOBIC) 15 MG tablet Take 15 mg by mouth daily.     Marland Kitchen oxycodone (ROXICODONE) 30 MG immediate release tablet One to two tablets every 8 hours as needed for pain.  Max 5 tab/day.  Must last 30 days.    Marland Kitchen oxyCODONE (ROXICODONE) 5 MG immediate release tablet Take 1 tablet (5 mg total) by mouth every 6 (six) hours as needed. 20 tablet 0  . potassium chloride SA (K-DUR,KLOR-CON) 20 MEQ tablet Take 20 mEq by mouth every evening.     . prochlorperazine (COMPAZINE) 10 MG tablet Take 1 tablet (10 mg total) by mouth every 6 (six) hours as needed for nausea or vomiting. 30 tablet 0  . pseudoephedrine (SUDAFED) 120 MG 12 hr tablet Take 120 mg by mouth 2 (two) times daily as needed for congestion.     No current facility-administered  medications for this visit.     SURGICAL HISTORY:  Past Surgical History:  Procedure Laterality Date  . ANTERIOR CERVICAL DECOMP/DISCECTOMY FUSION N/A 10/27/2018   Procedure: ANTERIOR CERVICAL THREE-FOUR, FOUR-FIVE DECOMPRESSION/DISCECTOMY FUSION TWO LEVELS;  Surgeon: Kary Kos, MD;  Location: [facility];  Service: Neurosurgery;  Laterality: N/A;   ANTERIOR CERVICAL THREE-FOUR, FOUR-FIVE DECOMPRESSION/DISCECTOMY FUSION TWO LEVELS  . BACK SURGERY    . IR IMAGING GUIDED PORT INSERTION  11/27/2018  . MULTIPLE TOOTH EXTRACTIONS    . SPINAL CORD STIMULATOR IMPLANT    . VIDEO BRONCHOSCOPY N/A 10/29/2018   Procedure: VIDEO BRONCHOSCOPY;  Surgeon: Laurin Coder, MD;   Location: [facility];  Service: Thoracic;  Laterality: N/A;  . VIDEO BRONCHOSCOPY WITH ENDOBRONCHIAL ULTRASOUND N/A 10/27/2018   Procedure: VIDEO BRONCHOSCOPY WITH ENDOBRONCHIAL ULTRASOUND;  Surgeon: Laurin Coder, MD;  Location: [facility];  Service: Thoracic;  Laterality: N/A;  . VIDEO BRONCHOSCOPY WITH ENDOBRONCHIAL ULTRASOUND N/A 10/29/2018   Procedure: VIDEO BRONCHOSCOPY WITH ENDOBRONCHIAL ULTRASOUND;  Surgeon: Laurin Coder, MD;  [facility];  Service: Thoracic;  Laterality: N/A;    REVIEW OF SYSTEMS:  A comprehensive review of systems was negative except for: Gastrointestinal: positive for odynophagia   PHYSICAL EXAMINATION: General appearance: alert, cooperative and no distress Head: Normocephalic, without obvious abnormality, atraumatic Neck: no adenopathy, no JVD, supple, symmetrical, trachea midline and thyroid not enlarged, symmetric, no tenderness/mass/nodules Lymph nodes: Cervical, supraclavicular, and axillary nodes normal. Resp: clear to auscultation bilaterally Back: symmetric, no curvature. ROM normal. No CVA tenderness. Cardio: regular rate and rhythm, S1, S2 normal, no murmur, click, rub or gallop GI: soft, non-tender; bowel sounds normal; no masses,  no organomegaly Extremities: extremities normal, atraumatic, no cyanosis or edema  ECOG PERFORMANCE STATUS: 1 - Symptomatic but completely ambulatory  Blood pressure 126/70, pulse 85, temperature 98.6 F (37 C), temperature source Oral, resp. rate 18, height 6\' 1"  (1.854 m), weight (!) 300 lb 14.4 oz (136.5 kg), SpO2 99 %.  LABORATORY DATA: Lab Results  Component Value Date   WBC 6.5 12/22/2018   HGB 12.1 (L) 12/22/2018   HCT 37.1 (L) 12/22/2018   MCV 91.6 12/22/2018   PLT 222 12/22/2018      Chemistry      Component Value Date/Time   NA 140 12/16/2018 0902   K 4.3 12/16/2018 0902   CL 106 12/16/2018 0902   CO2 27 12/16/2018 0902   BUN 17 12/16/2018 0902   CREATININE 0.90 12/16/2018 0902      Component  Value Date/Time   CALCIUM 9.2 12/16/2018 0902   ALKPHOS 100 12/16/2018 0902   AST 13 (L) 12/16/2018 0902   ALT 23 12/16/2018 0902   BILITOT 0.5 12/16/2018 0902       RADIOGRAPHIC STUDIES: Ct Head Wo Contrast  Result Date: 11/27/2018 CLINICAL DATA:  Recent diagnosis of non-small cell carcinoma of the right lung. Recent anterior cervical discectomy and fusion. Left-sided weakness. EXAM: CT HEAD WITHOUT CONTRAST TECHNIQUE: Contiguous axial images were obtained from the base of the skull through the vertex without intravenous contrast. COMPARISON:  Head CT 06/30/2017 FINDINGS: Brain: No CT evidence of mass lesion or acute infarction. Mild to moderate chronic small-vessel ischemic changes affect the cerebral hemispheric white matter. No hemorrhage, hydrocephalus or extra-axial collection. Vascular: There is atherosclerotic calcification of the major vessels at the base of the brain. Skull: Negative Sinuses/Orbits: Clear/normal Other: None IMPRESSION: No acute finding. No evidence of metastatic disease. Chronic small-vessel ischemic changes of the cerebral hemispheric white matter. Electronically Signed  By: Nelson Chimes M.D.   On: 11/27/2018 07:37   [place] Fna Soft Tissue  Result Date: 12/01/2018 INDICATION: 63 year old with right lung cancer and hypermetabolic nodule in the left upper neck on PET-CT. Plan for ultrasound-guided FNA of this hypermetabolic nodule. EXAM: ULTRASOUND-GUIDED FINE NEEDLE ASPIRATION OF LEFT CERVICAL LYMPH NODE MEDICATIONS: None. ANESTHESIA/SEDATION: Moderate (conscious) sedation was employed during this procedure. A total of Versed 1.5 mg and Fentanyl 75 mcg was administered intravenously. Moderate Sedation Time: 14 minutes. The patient's level of consciousness and vital signs were monitored continuously by radiology nursing throughout the procedure under my direct supervision. FLUOROSCOPY TIME:  None COMPLICATIONS: None immediate. PROCEDURE: Informed written consent was obtained  from the patient after a thorough discussion of the procedural risks, benefits and alternatives. All questions were addressed. A timeout was performed prior to the initiation of the procedure. The left upper cervical region around the left submandibular gland and left parotid gland was thoroughly evaluated with ultrasound. There is no evidence for a salivary gland tumor. There are few prominent but normal appearing lymph nodes in the left upper neck near the mandibular angle. Prominent lymph node that appears to correspond with the hypermetabolic lymph node on PET-CT was targeted for fine-needle aspiration. The neck was prepped with chlorhexidine and skin was anesthetized with 1% lidocaine. Using ultrasound guidance, 5 fine-needle aspirations were obtained with 25 gauge needles. Bandage placed over the puncture site. FINDINGS: Several small lymph nodes in the left upper neck. No evidence for a parotid or salivary gland lesion. Prominent but normal appearing hypoechoic lymph node was sampled. IMPRESSION: Ultrasound-guided fine needle aspiration of a mildly prominent but normal appearing cervical lymph node in the left upper neck. This lymph node appears to correspond with the hypermetabolic activity on the previous PET-CT but there are several lymph nodes in this area. No evidence for a discrete parotid or salivary gland tumor. This area may require imaging follow-up with a dedicated neck CT with IV contrast or attention on follow-up PET-CT. Electronically Signed   By: Markus Daft M.D.   On: 12/01/2018 17:45   Ir Imaging Guided Port Insertion  Result Date: 11/27/2018 CLINICAL DATA:  Lung carcinoma. Needs durable venous access for chemotherapy regimen. EXAM: TUNNELED PORT CATHETER PLACEMENT WITH ULTRASOUND AND FLUOROSCOPIC GUIDANCE FLUOROSCOPY TIME:  0.1 minute; 13  uGym2 DAP ANESTHESIA/SEDATION: Intravenous Fentanyl and Versed were administered as conscious sedation during continuous monitoring of the patient's  level of consciousness and physiological / cardiorespiratory status by the radiology RN, with a total moderate sedation time of 13 minutes. TECHNIQUE: The procedure, risks, benefits, and alternatives were explained to the patient. Questions regarding the procedure were encouraged and answered. The patient understands and consents to the procedure. As antibiotic prophylaxis, cefazolin 2 g was ordered pre-procedure and administered intravenously within one hour of incision. Patency of the right IJ vein was confirmed with ultrasound with image documentation. An appropriate skin site was determined. Skin site was marked. Region was prepped using maximum barrier technique including cap and mask, sterile gown, sterile gloves, large sterile sheet, and Chlorhexidine as cutaneous antisepsis. The region was infiltrated locally with 1% lidocaine. Under real-time ultrasound guidance, the right IJ vein was accessed with a 21 gauge micropuncture needle; the needle tip within the vein was confirmed with ultrasound image documentation. Needle was exchanged over a 018 guidewire for transitional dilator which allowed passage of the [facility] wire into the IVC. Over this, the transitional dilator was exchanged for a 5 [place] MPA catheter. A small incision  was made on the right anterior chest wall and a subcutaneous pocket fashioned. The power-injectable port was positioned and its catheter tunneled to the right IJ dermatotomy site. The MPA catheter was exchanged over an Amplatz wire for a peel-away sheath, through which the port catheter, which had been trimmed to the appropriate length, was advanced and positioned under fluoroscopy with its tip at the cavoatrial junction. Spot chest radiograph confirms good catheter position and no pneumothorax. The pocket was closed with deep interrupted and subcuticular continuous 3-0 Monocryl sutures. The port was flushed per protocol. The incisions were covered with Dermabond then covered with a  sterile dressing. COMPLICATIONS: COMPLICATIONS None immediate IMPRESSION: Technically successful right IJ power-injectable port catheter placement. Ready for routine use. Electronically Signed   By: Lucrezia Europe M.D.   On: 11/27/2018 15:27    ASSESSMENT AND PLAN: This is a very pleasant 63 years old white male with a stage IIIa non-small cell lung cancer, squamous cell carcinoma diagnosed in December 2019. The patient is currently undergoing treatment with concurrent chemoradiation with weekly carboplatin and paclitaxel status post 3 cycles.  He has been tolerating this treatment well with no concerning adverse effect except for mild odynophagia. I recommended for the patient to proceed with cycle #4 today as scheduled. I will see him back for follow-up visit in 2 weeks for evaluation before starting cycle #6. For the odynophagia, he was given prescription for Carafate by radiation oncology. The patient was advised to call immediately if he has any concerning symptoms in the interval. The patient voices understanding of current disease status and treatment options and is in agreement with the current care plan.  All questions were answered. The patient knows to call the clinic with any problems, questions or concerns. We can certainly see the patient much sooner if necessary.  I spent 10 minutes counseling the patient face to face. The total time spent in the appointment was 15 minutes.  Disclaimer: This note was dictated with voice recognition software. Similar sounding words can inadvertently be transcribed and may not be corrected upon review.

## 2018-12-22 NOTE — Patient Instructions (Signed)
   [facility] Discharge Instructions for Patients Receiving Chemotherapy  Today you received the following chemotherapy agents Taxol and Carboplatin   To help prevent nausea and vomiting after your treatment, we encourage you to take your nausea medication as directed.    If you develop nausea and vomiting that is not controlled by your nausea medication, call the clinic.   BELOW ARE SYMPTOMS THAT SHOULD BE REPORTED IMMEDIATELY:  *FEVER GREATER THAN 100.5 F  *CHILLS WITH OR WITHOUT FEVER  NAUSEA AND VOMITING THAT IS NOT CONTROLLED WITH YOUR NAUSEA MEDICATION  *UNUSUAL SHORTNESS OF BREATH  *UNUSUAL BRUISING OR BLEEDING  TENDERNESS IN MOUTH AND THROAT WITH OR WITHOUT PRESENCE OF ULCERS  *URINARY PROBLEMS  *BOWEL PROBLEMS  UNUSUAL RASH Items with * indicate a potential emergency and should be followed up as soon as possible.  Feel free to call the clinic should you have any questions or concerns. The clinic phone number is [phone].  Please show the CHEMO ALERT CARD at check-in to the Emergency Department and triage nurse.   

## 2018-12-23 DIAGNOSIS — C3411 Malignant neoplasm of upper lobe, right bronchus or lung: Secondary | ICD-10-CM | POA: Diagnosis not present

## 2018-12-23 DIAGNOSIS — Z9221 Personal history of antineoplastic chemotherapy: Secondary | ICD-10-CM | POA: Diagnosis not present

## 2018-12-23 DIAGNOSIS — I10 Essential (primary) hypertension: Secondary | ICD-10-CM | POA: Diagnosis not present

## 2018-12-23 DIAGNOSIS — Z79899 Other long term (current) drug therapy: Secondary | ICD-10-CM | POA: Diagnosis not present

## 2018-12-23 DIAGNOSIS — Z510 Encounter for antineoplastic radiation therapy: Secondary | ICD-10-CM | POA: Diagnosis not present

## 2018-12-24 DIAGNOSIS — Z9221 Personal history of antineoplastic chemotherapy: Secondary | ICD-10-CM | POA: Diagnosis not present

## 2018-12-24 DIAGNOSIS — I10 Essential (primary) hypertension: Secondary | ICD-10-CM | POA: Diagnosis not present

## 2018-12-24 DIAGNOSIS — C3411 Malignant neoplasm of upper lobe, right bronchus or lung: Secondary | ICD-10-CM | POA: Diagnosis not present

## 2018-12-24 DIAGNOSIS — Z79899 Other long term (current) drug therapy: Secondary | ICD-10-CM | POA: Diagnosis not present

## 2018-12-24 DIAGNOSIS — Z510 Encounter for antineoplastic radiation therapy: Secondary | ICD-10-CM | POA: Diagnosis not present

## 2018-12-25 DIAGNOSIS — Z79899 Other long term (current) drug therapy: Secondary | ICD-10-CM | POA: Diagnosis not present

## 2018-12-25 DIAGNOSIS — I10 Essential (primary) hypertension: Secondary | ICD-10-CM | POA: Diagnosis not present

## 2018-12-25 DIAGNOSIS — C3411 Malignant neoplasm of upper lobe, right bronchus or lung: Secondary | ICD-10-CM | POA: Diagnosis not present

## 2018-12-25 DIAGNOSIS — Z9221 Personal history of antineoplastic chemotherapy: Secondary | ICD-10-CM | POA: Diagnosis not present

## 2018-12-25 DIAGNOSIS — Z510 Encounter for antineoplastic radiation therapy: Secondary | ICD-10-CM | POA: Diagnosis not present

## 2018-12-26 DIAGNOSIS — Z9221 Personal history of antineoplastic chemotherapy: Secondary | ICD-10-CM | POA: Diagnosis not present

## 2018-12-26 DIAGNOSIS — C3411 Malignant neoplasm of upper lobe, right bronchus or lung: Secondary | ICD-10-CM | POA: Diagnosis not present

## 2018-12-26 DIAGNOSIS — Z510 Encounter for antineoplastic radiation therapy: Secondary | ICD-10-CM | POA: Diagnosis not present

## 2018-12-26 DIAGNOSIS — Z79899 Other long term (current) drug therapy: Secondary | ICD-10-CM | POA: Diagnosis not present

## 2018-12-26 DIAGNOSIS — I10 Essential (primary) hypertension: Secondary | ICD-10-CM | POA: Diagnosis not present

## 2018-12-29 ENCOUNTER — Inpatient Hospital Stay: Payer: Medicare Other

## 2018-12-29 ENCOUNTER — Ambulatory Visit: Payer: Self-pay

## 2018-12-29 VITALS — BP 125/77 | HR 88 | Temp 98.5°F | Resp 18 | Wt 307.0 lb

## 2018-12-29 DIAGNOSIS — I10 Essential (primary) hypertension: Secondary | ICD-10-CM | POA: Diagnosis not present

## 2018-12-29 DIAGNOSIS — C3491 Malignant neoplasm of unspecified part of right bronchus or lung: Secondary | ICD-10-CM

## 2018-12-29 DIAGNOSIS — C3411 Malignant neoplasm of upper lobe, right bronchus or lung: Secondary | ICD-10-CM | POA: Diagnosis not present

## 2018-12-29 DIAGNOSIS — Z79899 Other long term (current) drug therapy: Secondary | ICD-10-CM | POA: Diagnosis not present

## 2018-12-29 DIAGNOSIS — Z9221 Personal history of antineoplastic chemotherapy: Secondary | ICD-10-CM | POA: Diagnosis not present

## 2018-12-29 DIAGNOSIS — Z510 Encounter for antineoplastic radiation therapy: Secondary | ICD-10-CM | POA: Diagnosis not present

## 2018-12-29 DIAGNOSIS — Z5111 Encounter for antineoplastic chemotherapy: Secondary | ICD-10-CM | POA: Diagnosis not present

## 2018-12-29 DIAGNOSIS — Z95828 Presence of other vascular implants and grafts: Secondary | ICD-10-CM

## 2018-12-29 LAB — CBC WITH DIFFERENTIAL (CANCER CENTER ONLY)
Abs Immature Granulocytes: 0.12 10*3/uL — ABNORMAL HIGH (ref 0.00–0.07)
Basophils Absolute: 0 10*3/uL (ref 0.0–0.1)
Basophils Relative: 0 %
Eosinophils Absolute: 0.1 10*3/uL (ref 0.0–0.5)
Eosinophils Relative: 1 %
HCT: 36.1 % — ABNORMAL LOW (ref 39.0–52.0)
Hemoglobin: 11.5 g/dL — ABNORMAL LOW (ref 13.0–17.0)
Immature Granulocytes: 2 %
Lymphocytes Relative: 14 %
Lymphs Abs: 0.9 10*3/uL (ref 0.7–4.0)
MCH: 29.9 pg (ref 26.0–34.0)
MCHC: 31.9 g/dL (ref 30.0–36.0)
MCV: 93.8 fL (ref 80.0–100.0)
Monocytes Absolute: 0.5 10*3/uL (ref 0.1–1.0)
Monocytes Relative: 8 %
Neutro Abs: 4.6 10*3/uL (ref 1.7–7.7)
Neutrophils Relative %: 75 %
Platelet Count: 212 10*3/uL (ref 150–400)
RBC: 3.85 MIL/uL — ABNORMAL LOW (ref 4.22–5.81)
RDW: 14.6 % (ref 11.5–15.5)
WBC Count: 6.3 10*3/uL (ref 4.0–10.5)
nRBC: 0 % (ref 0.0–0.2)

## 2018-12-29 LAB — CMP (CANCER CENTER ONLY)
ALT: 16 U/L (ref 0–44)
AST: 14 U/L — ABNORMAL LOW (ref 15–41)
Albumin: 3.4 g/dL — ABNORMAL LOW (ref 3.5–5.0)
Alkaline Phosphatase: 87 U/L (ref 38–126)
Anion gap: 7 (ref 5–15)
BUN: 11 mg/dL (ref 8–23)
CO2: 27 mmol/L (ref 22–32)
Calcium: 8.8 mg/dL — ABNORMAL LOW (ref 8.9–10.3)
Chloride: 106 mmol/L (ref 98–111)
Creatinine: 0.83 mg/dL (ref 0.61–1.24)
GFR, Est AFR Am: >60 mL/min (ref >60)
GFR, Estimated: >60 mL/min (ref >60)
Glucose, Bld: 102 mg/dL — ABNORMAL HIGH (ref 70–99)
Potassium: 4.2 mmol/L (ref 3.5–5.1)
Sodium: 140 mmol/L (ref 135–145)
Total Bilirubin: 0.4 mg/dL (ref 0.3–1.2)
Total Protein: 6.7 g/dL (ref 6.5–8.1)

## 2018-12-29 MED ORDER — SODIUM CHLORIDE 0.9% FLUSH
10.0000 mL | INTRAVENOUS | Status: DC | PRN
  Administered 2018-12-29: 10 mL
  Filled 2018-12-29: qty 10

## 2018-12-29 MED ORDER — DIPHENHYDRAMINE HCL 50 MG/ML IJ SOLN
50.0000 mg | Freq: Once | INTRAMUSCULAR | Status: AC
  Administered 2018-12-29: 50 mg via INTRAVENOUS

## 2018-12-29 MED ORDER — FAMOTIDINE IN NACL 20-0.9 MG/50ML-% IV SOLN
INTRAVENOUS | Status: AC
  Filled 2018-12-29: qty 50

## 2018-12-29 MED ORDER — FAMOTIDINE IN NACL 20-0.9 MG/50ML-% IV SOLN
20.0000 mg | Freq: Once | INTRAVENOUS | Status: AC
  Administered 2018-12-29: 20 mg via INTRAVENOUS

## 2018-12-29 MED ORDER — HEPARIN SOD (PORK) LOCK FLUSH 100 UNIT/ML IV SOLN
500.0000 [IU] | Freq: Once | INTRAVENOUS | Status: AC | PRN
  Administered 2018-12-29: 500 [IU]
  Filled 2018-12-29: qty 5

## 2018-12-29 MED ORDER — PALONOSETRON HCL INJECTION 0.25 MG/5ML
0.2500 mg | Freq: Once | INTRAVENOUS | Status: AC
  Administered 2018-12-29: 0.25 mg via INTRAVENOUS

## 2018-12-29 MED ORDER — PALONOSETRON HCL INJECTION 0.25 MG/5ML
INTRAVENOUS | Status: AC
  Filled 2018-12-29: qty 5

## 2018-12-29 MED ORDER — SODIUM CHLORIDE 0.9 % IV SOLN
20.0000 mg | Freq: Once | INTRAVENOUS | Status: AC
  Administered 2018-12-29: 20 mg via INTRAVENOUS
  Filled 2018-12-29: qty 20

## 2018-12-29 MED ORDER — SODIUM CHLORIDE 0.9 % IV SOLN
300.0000 mg | Freq: Once | INTRAVENOUS | Status: AC
  Administered 2018-12-29: 300 mg via INTRAVENOUS
  Filled 2018-12-29: qty 30

## 2018-12-29 MED ORDER — SODIUM CHLORIDE 0.9% FLUSH
10.0000 mL | Freq: Once | INTRAVENOUS | Status: AC
  Administered 2018-12-29: 10 mL
  Filled 2018-12-29: qty 10

## 2018-12-29 MED ORDER — SODIUM CHLORIDE 0.9 % IV SOLN
45.0000 mg/m2 | Freq: Once | INTRAVENOUS | Status: AC
  Administered 2018-12-29: 120 mg via INTRAVENOUS
  Filled 2018-12-29: qty 20

## 2018-12-29 MED ORDER — SODIUM CHLORIDE 0.9 % IV SOLN
Freq: Once | INTRAVENOUS | Status: AC
  Administered 2018-12-29: 11:00:00 via INTRAVENOUS
  Filled 2018-12-29: qty 250

## 2018-12-29 MED ORDER — DIPHENHYDRAMINE HCL 50 MG/ML IJ SOLN
INTRAMUSCULAR | Status: AC
  Filled 2018-12-29: qty 1

## 2018-12-29 NOTE — Patient Instructions (Addendum)
[facility] Discharge Instructions for Patients Receiving Chemotherapy  Today you received the following chemotherapy agents Taxol, Carboplatin, To help prevent nausea and vomiting after your treatment, we encourage you to take your nausea medication as  Prescribed.If you develop nausea and vomiting that is not controlled by your nausea medication, call the clinic.   BELOW ARE SYMPTOMS THAT SHOULD BE REPORTED IMMEDIATELY:  *FEVER GREATER THAN 100.5 F  *CHILLS WITH OR WITHOUT FEVER  NAUSEA AND VOMITING THAT IS NOT CONTROLLED WITH YOUR NAUSEA MEDICATION  *UNUSUAL SHORTNESS OF BREATH  *UNUSUAL BRUISING OR BLEEDING  TENDERNESS IN MOUTH AND THROAT WITH OR WITHOUT PRESENCE OF ULCERS  *URINARY PROBLEMS  *BOWEL PROBLEMS  UNUSUAL RASH Items with * indicate a potential emergency and should be followed up as soon as possible.  Feel free to call the clinic should you have any questions or concerns. The clinic phone number is [phone].  Please show the [facility] at check-in to the Emergency Department and triage nurse.

## 2018-12-30 DIAGNOSIS — C3411 Malignant neoplasm of upper lobe, right bronchus or lung: Secondary | ICD-10-CM | POA: Diagnosis not present

## 2018-12-30 DIAGNOSIS — I10 Essential (primary) hypertension: Secondary | ICD-10-CM | POA: Diagnosis not present

## 2018-12-30 DIAGNOSIS — Z510 Encounter for antineoplastic radiation therapy: Secondary | ICD-10-CM | POA: Diagnosis not present

## 2018-12-30 DIAGNOSIS — Z79899 Other long term (current) drug therapy: Secondary | ICD-10-CM | POA: Diagnosis not present

## 2018-12-30 DIAGNOSIS — Z9221 Personal history of antineoplastic chemotherapy: Secondary | ICD-10-CM | POA: Diagnosis not present

## 2018-12-31 DIAGNOSIS — Z79899 Other long term (current) drug therapy: Secondary | ICD-10-CM | POA: Diagnosis not present

## 2018-12-31 DIAGNOSIS — Z9221 Personal history of antineoplastic chemotherapy: Secondary | ICD-10-CM | POA: Diagnosis not present

## 2018-12-31 DIAGNOSIS — I10 Essential (primary) hypertension: Secondary | ICD-10-CM | POA: Diagnosis not present

## 2018-12-31 DIAGNOSIS — C3411 Malignant neoplasm of upper lobe, right bronchus or lung: Secondary | ICD-10-CM | POA: Diagnosis not present

## 2018-12-31 DIAGNOSIS — Z510 Encounter for antineoplastic radiation therapy: Secondary | ICD-10-CM | POA: Diagnosis not present

## 2019-01-01 DIAGNOSIS — Z79899 Other long term (current) drug therapy: Secondary | ICD-10-CM | POA: Diagnosis not present

## 2019-01-01 DIAGNOSIS — I10 Essential (primary) hypertension: Secondary | ICD-10-CM | POA: Diagnosis not present

## 2019-01-01 DIAGNOSIS — Z9221 Personal history of antineoplastic chemotherapy: Secondary | ICD-10-CM | POA: Diagnosis not present

## 2019-01-01 DIAGNOSIS — C3411 Malignant neoplasm of upper lobe, right bronchus or lung: Secondary | ICD-10-CM | POA: Diagnosis not present

## 2019-01-01 DIAGNOSIS — Z510 Encounter for antineoplastic radiation therapy: Secondary | ICD-10-CM | POA: Diagnosis not present

## 2019-01-02 DIAGNOSIS — I10 Essential (primary) hypertension: Secondary | ICD-10-CM | POA: Diagnosis not present

## 2019-01-02 DIAGNOSIS — Z510 Encounter for antineoplastic radiation therapy: Secondary | ICD-10-CM | POA: Diagnosis not present

## 2019-01-02 DIAGNOSIS — Z9221 Personal history of antineoplastic chemotherapy: Secondary | ICD-10-CM | POA: Diagnosis not present

## 2019-01-02 DIAGNOSIS — C3411 Malignant neoplasm of upper lobe, right bronchus or lung: Secondary | ICD-10-CM | POA: Diagnosis not present

## 2019-01-02 DIAGNOSIS — Z79899 Other long term (current) drug therapy: Secondary | ICD-10-CM | POA: Diagnosis not present

## 2019-01-05 ENCOUNTER — Inpatient Hospital Stay: Payer: Medicare Other

## 2019-01-05 ENCOUNTER — Telehealth: Payer: Self-pay | Admitting: Internal Medicine

## 2019-01-05 ENCOUNTER — Encounter: Payer: Self-pay | Admitting: Internal Medicine

## 2019-01-05 ENCOUNTER — Inpatient Hospital Stay (HOSPITAL_BASED_OUTPATIENT_CLINIC_OR_DEPARTMENT_OTHER): Payer: Medicare Other | Admitting: Internal Medicine

## 2019-01-05 ENCOUNTER — Ambulatory Visit: Payer: Self-pay

## 2019-01-05 VITALS — BP 110/83 | HR 100 | Temp 98.6°F | Resp 18 | Ht 73.0 in | Wt 306.4 lb

## 2019-01-05 DIAGNOSIS — Z5111 Encounter for antineoplastic chemotherapy: Secondary | ICD-10-CM

## 2019-01-05 DIAGNOSIS — C3491 Malignant neoplasm of unspecified part of right bronchus or lung: Secondary | ICD-10-CM

## 2019-01-05 DIAGNOSIS — R1310 Dysphagia, unspecified: Secondary | ICD-10-CM

## 2019-01-05 DIAGNOSIS — Z79899 Other long term (current) drug therapy: Secondary | ICD-10-CM | POA: Diagnosis not present

## 2019-01-05 DIAGNOSIS — I10 Essential (primary) hypertension: Secondary | ICD-10-CM

## 2019-01-05 DIAGNOSIS — C3490 Malignant neoplasm of unspecified part of unspecified bronchus or lung: Secondary | ICD-10-CM

## 2019-01-05 DIAGNOSIS — Z9221 Personal history of antineoplastic chemotherapy: Secondary | ICD-10-CM | POA: Diagnosis not present

## 2019-01-05 DIAGNOSIS — C3411 Malignant neoplasm of upper lobe, right bronchus or lung: Secondary | ICD-10-CM

## 2019-01-05 DIAGNOSIS — Z95828 Presence of other vascular implants and grafts: Secondary | ICD-10-CM

## 2019-01-05 DIAGNOSIS — Z510 Encounter for antineoplastic radiation therapy: Secondary | ICD-10-CM | POA: Diagnosis not present

## 2019-01-05 LAB — CBC WITH DIFFERENTIAL (CANCER CENTER ONLY)
Abs Immature Granulocytes: 0.08 10*3/uL — ABNORMAL HIGH (ref 0.00–0.07)
Basophils Absolute: 0 10*3/uL (ref 0.0–0.1)
Basophils Relative: 1 %
Eosinophils Absolute: 0.1 10*3/uL (ref 0.0–0.5)
Eosinophils Relative: 1 %
HCT: 37.6 % — ABNORMAL LOW (ref 39.0–52.0)
Hemoglobin: 11.9 g/dL — ABNORMAL LOW (ref 13.0–17.0)
Immature Granulocytes: 2 %
Lymphocytes Relative: 16 %
Lymphs Abs: 0.8 10*3/uL (ref 0.7–4.0)
MCH: 29.8 pg (ref 26.0–34.0)
MCHC: 31.6 g/dL (ref 30.0–36.0)
MCV: 94 fL (ref 80.0–100.0)
Monocytes Absolute: 0.5 10*3/uL (ref 0.1–1.0)
Monocytes Relative: 10 %
Neutro Abs: 3.8 10*3/uL (ref 1.7–7.7)
Neutrophils Relative %: 70 %
Platelet Count: 180 10*3/uL (ref 150–400)
RBC: 4 MIL/uL — ABNORMAL LOW (ref 4.22–5.81)
RDW: 15.4 % (ref 11.5–15.5)
WBC Count: 5.3 10*3/uL (ref 4.0–10.5)
nRBC: 0 % (ref 0.0–0.2)

## 2019-01-05 LAB — CMP (CANCER CENTER ONLY)
ALT: 20 U/L (ref 0–44)
AST: 16 U/L (ref 15–41)
Albumin: 3.3 g/dL — ABNORMAL LOW (ref 3.5–5.0)
Alkaline Phosphatase: 90 U/L (ref 38–126)
Anion gap: 8 (ref 5–15)
BUN: 10 mg/dL (ref 8–23)
CO2: 27 mmol/L (ref 22–32)
Calcium: 9.1 mg/dL (ref 8.9–10.3)
Chloride: 104 mmol/L (ref 98–111)
Creatinine: 0.81 mg/dL (ref 0.61–1.24)
GFR, Est AFR Am: >60 mL/min (ref >60)
GFR, Estimated: >60 mL/min (ref >60)
Glucose, Bld: 91 mg/dL (ref 70–99)
Potassium: 4.4 mmol/L (ref 3.5–5.1)
Sodium: 139 mmol/L (ref 135–145)
Total Bilirubin: 0.4 mg/dL (ref 0.3–1.2)
Total Protein: 6.8 g/dL (ref 6.5–8.1)

## 2019-01-05 MED ORDER — SODIUM CHLORIDE 0.9 % IV SOLN
45.0000 mg/m2 | Freq: Once | INTRAVENOUS | Status: AC
  Administered 2019-01-05: 120 mg via INTRAVENOUS
  Filled 2019-01-05: qty 20

## 2019-01-05 MED ORDER — SODIUM CHLORIDE 0.9 % IV SOLN
20.0000 mg | Freq: Once | INTRAVENOUS | Status: AC
  Administered 2019-01-05: 20 mg via INTRAVENOUS
  Filled 2019-01-05: qty 2

## 2019-01-05 MED ORDER — PALONOSETRON HCL INJECTION 0.25 MG/5ML
0.2500 mg | Freq: Once | INTRAVENOUS | Status: AC
  Administered 2019-01-05: 0.25 mg via INTRAVENOUS

## 2019-01-05 MED ORDER — FAMOTIDINE IN NACL 20-0.9 MG/50ML-% IV SOLN: 20.0000 mg | Freq: Once | INTRAVENOUS | Status: DC

## 2019-01-05 MED ORDER — SODIUM CHLORIDE 0.9 % IV SOLN
300.0000 mg | Freq: Once | INTRAVENOUS | Status: AC
  Administered 2019-01-05: 300 mg via INTRAVENOUS
  Filled 2019-01-05: qty 30

## 2019-01-05 MED ORDER — SODIUM CHLORIDE 0.9% FLUSH
10.0000 mL | Freq: Once | INTRAVENOUS | Status: AC
  Administered 2019-01-05: 10 mL
  Filled 2019-01-05: qty 10

## 2019-01-05 MED ORDER — HEPARIN SOD (PORK) LOCK FLUSH 100 UNIT/ML IV SOLN
500.0000 [IU] | Freq: Once | INTRAVENOUS | Status: AC | PRN
  Administered 2019-01-05: 500 [IU]
  Filled 2019-01-05: qty 5

## 2019-01-05 MED ORDER — SODIUM CHLORIDE 0.9% FLUSH
10.0000 mL | INTRAVENOUS | Status: DC | PRN
  Administered 2019-01-05: 10 mL
  Filled 2019-01-05: qty 10

## 2019-01-05 MED ORDER — SODIUM CHLORIDE 0.9 % IV SOLN
20.0000 mg | Freq: Once | INTRAVENOUS | Status: AC
  Administered 2019-01-05: 20 mg via INTRAVENOUS
  Filled 2019-01-05: qty 20

## 2019-01-05 MED ORDER — DIPHENHYDRAMINE HCL 50 MG/ML IJ SOLN
50.0000 mg | Freq: Once | INTRAMUSCULAR | Status: AC
  Administered 2019-01-05: 50 mg via INTRAVENOUS

## 2019-01-05 MED ORDER — SODIUM CHLORIDE 0.9 % IV SOLN
Freq: Once | INTRAVENOUS | Status: AC
  Administered 2019-01-05: 11:00:00 via INTRAVENOUS
  Filled 2019-01-05: qty 250

## 2019-01-05 NOTE — Progress Notes (Signed)
[facility] Telephone:(336) [phone]   Fax:(336) [phone]  OFFICE PROGRESS NOTE  [name], MD [place] [address]  DIAGNOSIS: stage IIIa (T2b, N2, M0) non-small cell lung cancer, squamous cell carcinoma presented with right upper lobe lung mass in addition to right paratracheal lymphadenopathy diagnosed in December 2019. The patient also has a hypermetabolic lesion in the left parotid gland that need further evaluation.  PRIOR THERAPY: None  CURRENT THERAPY: concurrent chemoradiation with weekly carboplatin for AUC of 2 and paclitaxel 45 mg/M2.  First dose started on 12/02/2018. Status post 5 cycles. The patient is receiving radiation closer to home in [place], [place].  INTERVAL HISTORY: Alexander Hatfield. 63 y.o. male returns to the clinic today for follow-up visit accompanied by family member.  The patient is feeling fine today with no concerning complaints except for mild odynophagia and he is currently on treatment with Carafate.  He is expected to complete the course of radiotherapy on January 12, 2019.  He has been tolerating his systemic chemotherapy fairly well.  He denied having any fever or chills.  He has no nausea, vomiting, diarrhea or constipation.  He denied having any significant weight loss or night sweats.  He is here today for evaluation before starting cycle #6.  MEDICAL HISTORY: Past Medical History:  Diagnosis Date  . Arthritis   . Chronic back pain   . Chronic back pain   . Headache   . HOH (hard of hearing)   . Hypertension   . Lung mass   . Pre-diabetes   . Sleep apnea    wears cpap  . Wears glasses   . Wears partial dentures     ALLERGIES:  is allergic to iohexol.  MEDICATIONS:  Current Outpatient Medications  Medication Sig Dispense Refill  . amLODipine (NORVASC) 10 MG tablet Take 10 mg by mouth daily.     . baclofen (LIORESAL) 20 MG tablet Take 20 mg by mouth 3 (three) times daily as needed for  muscle spasms.     . cyclobenzaprine (FLEXERIL) 10 MG tablet Take 1 tablet (10 mg total) by mouth 3 (three) times daily as needed for muscle spasms. 30 tablet 0  . furosemide (LASIX) 20 MG tablet Take 20 mg by mouth daily.     Marland Kitchen gabapentin (NEURONTIN) 300 MG capsule Take 600 mg by mouth 3 (three) times daily.     Marland Kitchen lidocaine-prilocaine (EMLA) cream Apply 1 application topically as needed. 30 g 0  . losartan (COZAAR) 100 MG tablet Take 100 mg by mouth daily.     . magic mouthwash w/lidocaine SOLN Take by mouth.    . meloxicam (MOBIC) 15 MG tablet Take 15 mg by mouth daily.     Marland Kitchen oxycodone (ROXICODONE) 30 MG immediate release tablet One to two tablets every 8 hours as needed for pain.  Max 5 tab/day.  Must last 30 days.    Marland Kitchen oxyCODONE (ROXICODONE) 5 MG immediate release tablet Take 1 tablet (5 mg total) by mouth every 6 (six) hours as needed. 20 tablet 0  . potassium chloride SA (K-DUR,KLOR-CON) 20 MEQ tablet Take 20 mEq by mouth every evening.     . prochlorperazine (COMPAZINE) 10 MG tablet Take 1 tablet (10 mg total) by mouth every 6 (six) hours as needed for nausea or vomiting. 30 tablet 0  . pseudoephedrine (SUDAFED) 120 MG 12 hr tablet Take 120 mg by mouth 2 (two) times daily as needed for congestion.  No current facility-administered medications for this visit.     SURGICAL HISTORY:  Past Surgical History:  Procedure Laterality Date  . ANTERIOR CERVICAL DECOMP/DISCECTOMY FUSION N/A 10/27/2018   Procedure: ANTERIOR CERVICAL THREE-FOUR, FOUR-FIVE DECOMPRESSION/DISCECTOMY FUSION TWO LEVELS;  Surgeon: Kary Kos, MD;  Location: [facility];  Service: Neurosurgery;  Laterality: N/A;   ANTERIOR CERVICAL THREE-FOUR, FOUR-FIVE DECOMPRESSION/DISCECTOMY FUSION TWO LEVELS  . BACK SURGERY    . IR IMAGING GUIDED PORT INSERTION  11/27/2018  . MULTIPLE TOOTH EXTRACTIONS    . SPINAL CORD STIMULATOR IMPLANT    . VIDEO BRONCHOSCOPY N/A 10/29/2018   Procedure: VIDEO BRONCHOSCOPY;  Surgeon: Laurin Coder, MD;  Location: [facility];  Service: Thoracic;  Laterality: N/A;  . VIDEO BRONCHOSCOPY WITH ENDOBRONCHIAL ULTRASOUND N/A 10/27/2018   Procedure: VIDEO BRONCHOSCOPY WITH ENDOBRONCHIAL ULTRASOUND;  Surgeon: Laurin Coder, MD;  Location: [facility];  Service: Thoracic;  Laterality: N/A;  . VIDEO BRONCHOSCOPY WITH ENDOBRONCHIAL ULTRASOUND N/A 10/29/2018   Procedure: VIDEO BRONCHOSCOPY WITH ENDOBRONCHIAL ULTRASOUND;  Surgeon: Laurin Coder, MD;  Location[facility];  Service: Thoracic;  Laterality: N/A;    REVIEW OF SYSTEMS:  A comprehensive review of systems was negative except for: Gastrointestinal: positive for odynophagia   PHYSICAL EXAMINATION: General appearance: alert, cooperative and no distress Head: Normocephalic, without obvious abnormality, atraumatic Neck: no adenopathy, no JVD, supple, symmetrical, trachea midline and thyroid not enlarged, symmetric, no tenderness/mass/nodules Lymph nodes: Cervical, supraclavicular, and axillary nodes normal. Resp: clear to auscultation bilaterally Back: symmetric, no curvature. ROM normal. No CVA tenderness. Cardio: regular rate and rhythm, S1, S2 normal, no murmur, click, rub or gallop GI: soft, non-tender; bowel sounds normal; no masses,  no organomegaly Extremities: extremities normal, atraumatic, no cyanosis or edema  ECOG PERFORMANCE STATUS: 1 - Symptomatic but completely ambulatory  Blood pressure 110/83, pulse 100, temperature 98.6 F (37 C), temperature source Oral, resp. rate 18, height 6\' 1"  (1.854 m), weight (!) 306 lb 6.4 oz (139 kg), SpO2 100 %.  LABORATORY DATA: Lab Results  Component Value Date   WBC 5.3 01/05/2019   HGB 11.9 (L) 01/05/2019   HCT 37.6 (L) 01/05/2019   MCV 94.0 01/05/2019   PLT 180 01/05/2019      Chemistry      Component Value Date/Time   NA 140 12/29/2018 0944   K 4.2 12/29/2018 0944   CL 106 12/29/2018 0944   CO2 27 12/29/2018 0944   BUN 11 12/29/2018 0944   CREATININE 0.83 12/29/2018 0944        Component Value Date/Time   CALCIUM 8.8 (L) 12/29/2018 0944   ALKPHOS 87 12/29/2018 0944   AST 14 (L) 12/29/2018 0944   ALT 16 12/29/2018 0944   BILITOT 0.4 12/29/2018 0944       RADIOGRAPHIC STUDIES: No results found.  ASSESSMENT AND PLAN: This is a very pleasant 63 years old white male with a stage IIIa non-small cell lung cancer, squamous cell carcinoma diagnosed in December 2019. The patient is currently undergoing treatment with concurrent chemoradiation with weekly carboplatin and paclitaxel status post 5 cycles.  The patient has been tolerating this treatment well with no concerning adverse effect except for mild odynophagia but he has no significant weight loss.  He actually gained 14 pounds in the last few weeks. I recommended for him to proceed with cycle #6 today as scheduled. I will see him back for follow-up visit in 4 weeks for evaluation after repeating CT scan of the chest for restaging of his disease. The  patient was advised to call immediately if he has any concerning symptoms in the interval. The patient voices understanding of current disease status and treatment options and is in agreement with the current care plan. All questions were answered. The patient knows to call the clinic with any problems, questions or concerns. We can certainly see the patient much sooner if necessary.  I spent 10 minutes counseling the patient face to face. The total time spent in the appointment was 15 minutes.  Disclaimer: This note was dictated with voice recognition software. Similar sounding words can inadvertently be transcribed and may not be corrected upon review.

## 2019-01-05 NOTE — Patient Instructions (Signed)
   [facility] Discharge Instructions for Patients Receiving Chemotherapy  Today you received the following chemotherapy agents Taxol and Carboplatin   To help prevent nausea and vomiting after your treatment, we encourage you to take your nausea medication as directed.    If you develop nausea and vomiting that is not controlled by your nausea medication, call the clinic.   BELOW ARE SYMPTOMS THAT SHOULD BE REPORTED IMMEDIATELY:  *FEVER GREATER THAN 100.5 F  *CHILLS WITH OR WITHOUT FEVER  NAUSEA AND VOMITING THAT IS NOT CONTROLLED WITH YOUR NAUSEA MEDICATION  *UNUSUAL SHORTNESS OF BREATH  *UNUSUAL BRUISING OR BLEEDING  TENDERNESS IN MOUTH AND THROAT WITH OR WITHOUT PRESENCE OF ULCERS  *URINARY PROBLEMS  *BOWEL PROBLEMS  UNUSUAL RASH Items with * indicate a potential emergency and should be followed up as soon as possible.  Feel free to call the clinic should you have any questions or concerns. The clinic phone number is [phone].  Please show the CHEMO ALERT CARD at check-in to the Emergency Department and triage nurse.   

## 2019-01-05 NOTE — Telephone Encounter (Signed)
Gave avs and calendar ° °

## 2019-01-06 DIAGNOSIS — Z9221 Personal history of antineoplastic chemotherapy: Secondary | ICD-10-CM | POA: Diagnosis not present

## 2019-01-06 DIAGNOSIS — C3411 Malignant neoplasm of upper lobe, right bronchus or lung: Secondary | ICD-10-CM | POA: Diagnosis not present

## 2019-01-06 DIAGNOSIS — Z79899 Other long term (current) drug therapy: Secondary | ICD-10-CM | POA: Diagnosis not present

## 2019-01-06 DIAGNOSIS — I10 Essential (primary) hypertension: Secondary | ICD-10-CM | POA: Diagnosis not present

## 2019-01-06 DIAGNOSIS — Z510 Encounter for antineoplastic radiation therapy: Secondary | ICD-10-CM | POA: Diagnosis not present

## 2019-01-07 DIAGNOSIS — Z510 Encounter for antineoplastic radiation therapy: Secondary | ICD-10-CM | POA: Diagnosis not present

## 2019-01-07 DIAGNOSIS — Z79899 Other long term (current) drug therapy: Secondary | ICD-10-CM | POA: Diagnosis not present

## 2019-01-07 DIAGNOSIS — Z9221 Personal history of antineoplastic chemotherapy: Secondary | ICD-10-CM | POA: Diagnosis not present

## 2019-01-07 DIAGNOSIS — I10 Essential (primary) hypertension: Secondary | ICD-10-CM | POA: Diagnosis not present

## 2019-01-07 DIAGNOSIS — C3411 Malignant neoplasm of upper lobe, right bronchus or lung: Secondary | ICD-10-CM | POA: Diagnosis not present

## 2019-01-08 DIAGNOSIS — Z79899 Other long term (current) drug therapy: Secondary | ICD-10-CM | POA: Diagnosis not present

## 2019-01-08 DIAGNOSIS — Z510 Encounter for antineoplastic radiation therapy: Secondary | ICD-10-CM | POA: Diagnosis not present

## 2019-01-08 DIAGNOSIS — C3411 Malignant neoplasm of upper lobe, right bronchus or lung: Secondary | ICD-10-CM | POA: Diagnosis not present

## 2019-01-08 DIAGNOSIS — Z9221 Personal history of antineoplastic chemotherapy: Secondary | ICD-10-CM | POA: Diagnosis not present

## 2019-01-08 DIAGNOSIS — I10 Essential (primary) hypertension: Secondary | ICD-10-CM | POA: Diagnosis not present

## 2019-01-09 DIAGNOSIS — I10 Essential (primary) hypertension: Secondary | ICD-10-CM | POA: Diagnosis not present

## 2019-01-09 DIAGNOSIS — Z510 Encounter for antineoplastic radiation therapy: Secondary | ICD-10-CM | POA: Diagnosis not present

## 2019-01-09 DIAGNOSIS — Z9221 Personal history of antineoplastic chemotherapy: Secondary | ICD-10-CM | POA: Diagnosis not present

## 2019-01-09 DIAGNOSIS — C3411 Malignant neoplasm of upper lobe, right bronchus or lung: Secondary | ICD-10-CM | POA: Diagnosis not present

## 2019-01-09 DIAGNOSIS — Z79899 Other long term (current) drug therapy: Secondary | ICD-10-CM | POA: Diagnosis not present

## 2019-01-12 ENCOUNTER — Other Ambulatory Visit: Payer: Self-pay

## 2019-01-12 ENCOUNTER — Ambulatory Visit: Payer: Self-pay

## 2019-01-12 DIAGNOSIS — E785 Hyperlipidemia, unspecified: Secondary | ICD-10-CM | POA: Diagnosis not present

## 2019-01-12 DIAGNOSIS — Z9889 Other specified postprocedural states: Secondary | ICD-10-CM | POA: Diagnosis not present

## 2019-01-12 DIAGNOSIS — I2510 Atherosclerotic heart disease of native coronary artery without angina pectoris: Secondary | ICD-10-CM | POA: Diagnosis not present

## 2019-01-12 DIAGNOSIS — R7303 Prediabetes: Secondary | ICD-10-CM | POA: Diagnosis not present

## 2019-01-12 DIAGNOSIS — Z87891 Personal history of nicotine dependence: Secondary | ICD-10-CM | POA: Diagnosis not present

## 2019-01-12 DIAGNOSIS — Z9989 Dependence on other enabling machines and devices: Secondary | ICD-10-CM | POA: Diagnosis not present

## 2019-01-12 DIAGNOSIS — K119 Disease of salivary gland, unspecified: Secondary | ICD-10-CM | POA: Diagnosis not present

## 2019-01-12 DIAGNOSIS — G4733 Obstructive sleep apnea (adult) (pediatric): Secondary | ICD-10-CM | POA: Diagnosis not present

## 2019-01-12 DIAGNOSIS — Z79899 Other long term (current) drug therapy: Secondary | ICD-10-CM | POA: Diagnosis not present

## 2019-01-12 DIAGNOSIS — C3411 Malignant neoplasm of upper lobe, right bronchus or lung: Secondary | ICD-10-CM | POA: Diagnosis not present

## 2019-01-12 DIAGNOSIS — I10 Essential (primary) hypertension: Secondary | ICD-10-CM | POA: Diagnosis not present

## 2019-01-19 ENCOUNTER — Telehealth: Payer: Self-pay | Admitting: *Deleted

## 2019-01-19 NOTE — Telephone Encounter (Signed)
Oncology Nurse Navigator Documentation  Oncology Nurse Navigator Flowsheets 01/19/2019  Navigator Location [facility]  Navigator Encounter Type Telephone/I received a call from [place] in [place].  I was updated that patient was coughing up red tinged sputum at times.  I updated Dr. Julien Nordmann.  He updated me that patient can be seen with symptom management if needed. He said he feels it is from radiation and red tinged is not all the time.  I called patient and told him what Dr. Julien Nordmann said.  He said he would reached out to [place] if he needs to be seen.    Telephone Outgoing Call  Treatment Phase Follow-up  Barriers/Navigation Needs Education  Education Other  Interventions Education  Education Method Verbal  Acuity Level 2  Time Spent with Patient 30

## 2019-01-21 ENCOUNTER — Other Ambulatory Visit: Payer: Self-pay | Admitting: Medical Oncology

## 2019-01-21 DIAGNOSIS — C3491 Malignant neoplasm of unspecified part of right bronchus or lung: Secondary | ICD-10-CM

## 2019-01-21 NOTE — Progress Notes (Signed)
lvm for pt to get labs at [facility] 3/16

## 2019-01-22 DIAGNOSIS — G959 Disease of spinal cord, unspecified: Secondary | ICD-10-CM | POA: Diagnosis not present

## 2019-01-26 ENCOUNTER — Other Ambulatory Visit (HOSPITAL_COMMUNITY)
Admission: RE | Admit: 2019-01-26 | Discharge: 2019-01-26 | Disposition: A | Discharge status: Home / Self Care | Payer: Medicare Other | Source: Ambulatory Visit | Attending: Internal Medicine | Admitting: Internal Medicine

## 2019-01-26 ENCOUNTER — Other Ambulatory Visit: Payer: Self-pay

## 2019-01-26 ENCOUNTER — Telehealth: Payer: Self-pay | Admitting: Medical Oncology

## 2019-01-26 DIAGNOSIS — C3490 Malignant neoplasm of unspecified part of unspecified bronchus or lung: Secondary | ICD-10-CM | POA: Diagnosis not present

## 2019-01-26 LAB — COMPREHENSIVE METABOLIC PANEL
ALT: 24 U/L (ref 0–44)
AST: 23 U/L (ref 15–41)
Albumin: 3.6 g/dL (ref 3.5–5.0)
Alkaline Phosphatase: 96 U/L (ref 38–126)
Anion gap: 10 (ref 5–15)
BUN: 8 mg/dL (ref 8–23)
CO2: 27 mmol/L (ref 22–32)
Calcium: 9 mg/dL (ref 8.9–10.3)
Chloride: 102 mmol/L (ref 98–111)
Creatinine, Ser: 0.84 mg/dL (ref 0.61–1.24)
GFR calc Af Amer: >60 mL/min (ref >60)
GFR calc non Af Amer: >60 mL/min (ref >60)
Glucose, Bld: 95 mg/dL (ref 70–99)
Potassium: 3.7 mmol/L (ref 3.5–5.1)
Sodium: 139 mmol/L (ref 135–145)
Total Bilirubin: 0.4 mg/dL (ref 0.3–1.2)
Total Protein: 7.1 g/dL (ref 6.5–8.1)

## 2019-01-26 LAB — CBC WITH DIFFERENTIAL/PLATELET
Abs Immature Granulocytes: 0.05 10*3/uL (ref 0.00–0.07)
Basophils Absolute: 0 10*3/uL (ref 0.0–0.1)
Basophils Relative: 1 %
Eosinophils Absolute: 0.2 10*3/uL (ref 0.0–0.5)
Eosinophils Relative: 3 %
HCT: 37.2 % — ABNORMAL LOW (ref 39.0–52.0)
Hemoglobin: 11.6 g/dL — ABNORMAL LOW (ref 13.0–17.0)
Immature Granulocytes: 1 %
Lymphocytes Relative: 20 %
Lymphs Abs: 1 10*3/uL (ref 0.7–4.0)
MCH: 30.4 pg (ref 26.0–34.0)
MCHC: 31.2 g/dL (ref 30.0–36.0)
MCV: 97.4 fL (ref 80.0–100.0)
Monocytes Absolute: 0.8 10*3/uL (ref 0.1–1.0)
Monocytes Relative: 15 %
Neutro Abs: 3.2 10*3/uL (ref 1.7–7.7)
Neutrophils Relative %: 60 %
Platelets: 258 10*3/uL (ref 150–400)
RBC: 3.82 MIL/uL — ABNORMAL LOW (ref 4.22–5.81)
RDW: 17.4 % — ABNORMAL HIGH (ref 11.5–15.5)
WBC: 5.3 10*3/uL (ref 4.0–10.5)
nRBC: 0 % (ref 0.0–0.2)

## 2019-01-26 NOTE — Telephone Encounter (Signed)
I told wife for pt to keep his appt.

## 2019-01-27 ENCOUNTER — Ambulatory Visit (HOSPITAL_COMMUNITY)
Admission: RE | Admit: 2019-01-27 | Discharge: 2019-01-27 | Disposition: A | Discharge status: Home / Self Care | Payer: Medicare Other | Source: Ambulatory Visit | Attending: Internal Medicine | Admitting: Internal Medicine

## 2019-01-27 ENCOUNTER — Other Ambulatory Visit: Payer: Self-pay

## 2019-01-27 DIAGNOSIS — C3490 Malignant neoplasm of unspecified part of unspecified bronchus or lung: Secondary | ICD-10-CM | POA: Diagnosis not present

## 2019-01-27 DIAGNOSIS — J432 Centrilobular emphysema: Secondary | ICD-10-CM | POA: Diagnosis not present

## 2019-01-28 ENCOUNTER — Encounter: Payer: Self-pay | Admitting: Physician Assistant

## 2019-01-28 ENCOUNTER — Telehealth: Payer: Self-pay | Admitting: Physician Assistant

## 2019-01-28 ENCOUNTER — Inpatient Hospital Stay: Payer: Medicare Other | Attending: Internal Medicine | Admitting: Physician Assistant

## 2019-01-28 ENCOUNTER — Other Ambulatory Visit: Payer: Self-pay | Admitting: Internal Medicine

## 2019-01-28 VITALS — BP 124/79 | HR 110 | Temp 99.3°F | Resp 18 | Ht 73.0 in | Wt 310.8 lb

## 2019-01-28 DIAGNOSIS — Z5112 Encounter for antineoplastic immunotherapy: Secondary | ICD-10-CM | POA: Insufficient documentation

## 2019-01-28 DIAGNOSIS — C3411 Malignant neoplasm of upper lobe, right bronchus or lung: Secondary | ICD-10-CM

## 2019-01-28 DIAGNOSIS — R599 Enlarged lymph nodes, unspecified: Secondary | ICD-10-CM

## 2019-01-28 DIAGNOSIS — R1310 Dysphagia, unspecified: Secondary | ICD-10-CM

## 2019-01-28 DIAGNOSIS — R05 Cough: Secondary | ICD-10-CM | POA: Diagnosis not present

## 2019-01-28 DIAGNOSIS — R0609 Other forms of dyspnea: Secondary | ICD-10-CM | POA: Diagnosis not present

## 2019-01-28 DIAGNOSIS — Z79899 Other long term (current) drug therapy: Secondary | ICD-10-CM | POA: Diagnosis not present

## 2019-01-28 DIAGNOSIS — C3491 Malignant neoplasm of unspecified part of right bronchus or lung: Secondary | ICD-10-CM

## 2019-01-28 DIAGNOSIS — Z7189 Other specified counseling: Secondary | ICD-10-CM

## 2019-01-28 NOTE — Progress Notes (Signed)
[facility] OFFICE PROGRESS NOTE  [name], MD [place] [address]  DIAGNOSIS: Stage IIIa (T2b, N2, M0) non-small cell lung cancer, favoring adenocarcinoma presented with right upper lobe lung mass in addition to right paratracheal lymphadenopathy diagnosed in December 2019. The patient also has a hypermetabolic lesion in the left parotid gland that need further evaluation.  PRIOR THERAPY:  1) Concurrent chemoradiation with weekly carboplatin for AUC of 2 and paclitaxel 45 mg/M2.Status post 6 cycles. Last treatment February 24th, 2020.  2) Radiation closer to home in [place], [place]. Last treatment January 12, 2019.   CURRENT THERAPY: Consolidation Immunotherapy with Imfinzi 10 mg/kg IV every 2 weeks. First dose expected on March 26th, 2020.   INTERVAL HISTORY: Alexander Hatfield. 63 y.o. male returns to the clinic today for a follow-up visit accompanied by his sister. The patient is feeling fair today without any concerning complaints except for dyspnea on exertion and odynophagia secondary to radiation therapy. He uses Carafate for his odynophagia. The patient states his difficulty swallowing is moderately improving. He has not lost weight during this time. He actually gained 4lbs since his last visit.  He denies any fever, chills, night sweats, or weight loss.  He reports a baseline productive cough with associated chest soreness from coughing. He also states that about 2-3 weeks ago, he had blood tinged sputum for ~3 days or so which has resolved since that time. He denies any nausea, vomiting, diarrhea, or constipation.  He denies any headache or visual changes.  He has recently completed 6 cycles of concurrent chemoradiation and tolerated it well.  He recently had a restaging CT scan and is here for evaluation and to discuss his scan results and treatment options.  MEDICAL HISTORY: Past Medical History:  Diagnosis Date  . Arthritis   . Chronic  back pain   . Chronic back pain   . Headache   . HOH (hard of hearing)   . Hypertension   . Lung mass   . Pre-diabetes   . Sleep apnea    wears cpap  . Wears glasses   . Wears partial dentures     ALLERGIES:  is allergic to iohexol.  MEDICATIONS:  Current Outpatient Medications  Medication Sig Dispense Refill  . amLODipine (NORVASC) 10 MG tablet Take 10 mg by mouth daily.     . cyclobenzaprine (FLEXERIL) 10 MG tablet Take 1 tablet (10 mg total) by mouth 3 (three) times daily as needed for muscle spasms. 30 tablet 0  . furosemide (LASIX) 20 MG tablet Take 20 mg by mouth daily.     Marland Kitchen gabapentin (NEURONTIN) 300 MG capsule Take 600 mg by mouth 3 (three) times daily.     Marland Kitchen lidocaine-prilocaine (EMLA) cream Apply 1 application topically as needed. 30 g 0  . losartan (COZAAR) 100 MG tablet Take 100 mg by mouth daily.     . magic mouthwash w/lidocaine SOLN Take by mouth.    . meloxicam (MOBIC) 15 MG tablet Take 15 mg by mouth daily.     Marland Kitchen oxycodone (ROXICODONE) 30 MG immediate release tablet One to two tablets every 8 hours as needed for pain.  Max 5 tab/day.  Must last 30 days.    . potassium chloride SA (K-DUR,KLOR-CON) 20 MEQ tablet Take 20 mEq by mouth every evening.     . prochlorperazine (COMPAZINE) 10 MG tablet Take 1 tablet (10 mg total) by mouth every 6 (six) hours as needed for nausea or  vomiting. 30 tablet 0  . pseudoephedrine (SUDAFED) 120 MG 12 hr tablet Take 120 mg by mouth 2 (two) times daily as needed for congestion.     No current facility-administered medications for this visit.     SURGICAL HISTORY:  Past Surgical History:  Procedure Laterality Date  . ANTERIOR CERVICAL DECOMP/DISCECTOMY FUSION N/A 10/27/2018   Procedure: ANTERIOR CERVICAL THREE-FOUR, FOUR-FIVE DECOMPRESSION/DISCECTOMY FUSION TWO LEVELS;  Surgeon: Kary Kos, MD;  Location: [facility];  Service: Neurosurgery;  Laterality: N/A;   ANTERIOR CERVICAL THREE-FOUR, FOUR-FIVE DECOMPRESSION/DISCECTOMY FUSION TWO  LEVELS  . BACK SURGERY    . IR IMAGING GUIDED PORT INSERTION  11/27/2018  . MULTIPLE TOOTH EXTRACTIONS    . SPINAL CORD STIMULATOR IMPLANT    . VIDEO BRONCHOSCOPY N/A 10/29/2018   Procedure: VIDEO BRONCHOSCOPY;  Surgeon: Laurin Coder, MD;  Location: [facility];  Service: Thoracic;  Laterality: N/A;  . VIDEO BRONCHOSCOPY WITH ENDOBRONCHIAL ULTRASOUND N/A 10/27/2018   Procedure: VIDEO BRONCHOSCOPY WITH ENDOBRONCHIAL ULTRASOUND;  Surgeon: Laurin Coder, MD;  Location: [name];  Service: Thoracic;  Laterality: N/A;  . VIDEO BRONCHOSCOPY WITH ENDOBRONCHIAL ULTRASOUND N/A 10/29/2018   Procedure: VIDEO BRONCHOSCOPY WITH ENDOBRONCHIAL ULTRASOUND;  Surgeon: Laurin Coder, MD;  Location: [facility];  Service: Thoracic;  Laterality: N/A;    REVIEW OF SYSTEMS:   Review of Systems  Constitutional: Negative for appetite change, chills, fatigue, fever and unexpected weight change.  HENT:  Positive for odynophagia. Negative for mouth sores, nosebleeds, sore throat.  Eyes: Negative for eye problems and icterus.  Respiratory: Positive for baseline productive cough, blood tinged sputum, and chest soreness. Negative for wheezing.   Cardiovascular: Negative for chest pain and leg swelling.  Gastrointestinal: Negative for abdominal pain, constipation, diarrhea, nausea and vomiting.  Genitourinary: Negative for bladder incontinence, difficulty urinating, dysuria, frequency and hematuria.   Musculoskeletal: Negative for back pain, gait problem, neck pain and neck stiffness.  Skin: Negative for itching and rash.  Neurological: Negative for dizziness, extremity weakness, gait problem, headaches, light-headedness and seizures.  Hematological: Negative for adenopathy. Does not bruise/bleed easily.  Psychiatric/Behavioral: Negative for confusion, depression and sleep disturbance. The patient is not nervous/anxious.     PHYSICAL EXAMINATION:  Blood pressure 124/79, pulse (!) 110, temperature 99.3 F (37.4 C),  temperature source Oral, resp. rate 18, height 6\' 1"  (1.854 m), weight (!) 310 lb 12.8 oz (141 kg), SpO2 100 %.  ECOG PERFORMANCE STATUS: 1 - Symptomatic but completely ambulatory  Physical Exam  Constitutional: Oriented to person, place, and time and well-developed, well-nourished, and in no distress. No distress.  HENT:  Head: Normocephalic and atraumatic.  Mouth/Throat: Oropharynx is clear and moist. No oropharyngeal exudate.  Eyes: Conjunctivae are normal. Right eye exhibits no discharge. Left eye exhibits no discharge. No scleral icterus.  Neck: Normal range of motion. Neck supple.  Cardiovascular: Normal rate, regular rhythm, normal heart sounds and intact distal pulses.   Pulmonary/Chest: Effort normal and breath sounds normal. No respiratory distress. No wheezes. No rales.  Abdominal: Soft. Bowel sounds are normal. Exhibits no distension and no mass. There is no tenderness.  Musculoskeletal: Normal range of motion. Exhibits no edema.  Lymphadenopathy:    No cervical adenopathy.  Neurological: Alert and oriented to person, place, and time. Exhibits normal muscle tone. Gait normal. Coordination normal.  Skin: Skin is warm and dry. No rash noted. Not diaphoretic. No erythema. No pallor.  Psychiatric: Mood, memory and judgment normal.  Vitals reviewed.  LABORATORY DATA: Lab Results  Component Value Date  WBC 5.3 01/26/2019   HGB 11.6 (L) 01/26/2019   HCT 37.2 (L) 01/26/2019   MCV 97.4 01/26/2019   PLT 258 01/26/2019      Chemistry      Component Value Date/Time   NA 139 01/26/2019 1155   K 3.7 01/26/2019 1155   CL 102 01/26/2019 1155   CO2 27 01/26/2019 1155   BUN 8 01/26/2019 1155   CREATININE 0.84 01/26/2019 1155   CREATININE 0.81 01/05/2019 0857      Component Value Date/Time   CALCIUM 9.0 01/26/2019 1155   ALKPHOS 96 01/26/2019 1155   AST 23 01/26/2019 1155   AST 16 01/05/2019 0857   ALT 24 01/26/2019 1155   ALT 20 01/05/2019 0857   BILITOT 0.4 01/26/2019  1155   BILITOT 0.4 01/05/2019 0857       RADIOGRAPHIC STUDIES:  Ct Chest Wo Contrast  Result Date: 01/28/2019 CLINICAL DATA:  Stage IIIA right upper lobe squamous cell lung cancer diagnosed December 2019 with interval concurrent chemotherapy and radiation therapy. Restaging. EXAM: CT CHEST WITHOUT CONTRAST TECHNIQUE: Multidetector CT imaging of the chest was performed following the standard protocol without IV contrast. COMPARISON:  10/17/2018 chest CT.  10/24/2018 PET-CT. FINDINGS: Cardiovascular: Normal heart size. No significant pericardial effusion/thickening. Three-vessel coronary atherosclerosis. Right internal jugular Port-A-Cath terminates at the cavoatrial junction. Atherosclerotic thoracic aorta with stable 4.6 cm ascending thoracic aortic aneurysm. Top-normal main pulmonary artery (3.3 cm diameter), stable. Mediastinum/Nodes: Stable 3.7 cm heterogeneous right thyroid nodule. New mild circumferential wall thickening throughout the mid to upper thoracic esophagus (series 2/image 41). No axillary adenopathy. Enlarged 2.2 cm right paratracheal node (series 2/image 56), previously 2.4 cm, minimally decreased. No new pathologically enlarged mediastinal nodes. No discrete hilar adenopathy on this noncontrast scan. Lungs/Pleura: No pneumothorax. No pleural effusion. Spiculated posterior right upper lobe 3.5 x 3.0 cm lung mass (series 5/image 62), decreased from 4.0 x 3.5 cm on 10/17/2018 chest CT. New vague clustered subsolid pulmonary nodules in the posterior right lower lobe, largest 1.3 cm (series 5/image 103). Scattered subcentimeter calcified granulomas in both lungs. No additional new significant pulmonary nodules. Mild centrilobular emphysema with mild diffuse bronchial wall thickening. Upper abdomen: Stable thick calcifications at the periphery of the spleen compatible with remote insult. Musculoskeletal: No aggressive appearing focal osseous lesions. Symmetric mild to moderate bilateral  gynecomastia, unchanged. Inferior approach spinal stimulator lead is seen terminating in the posterior lower thoracic spinal canal. Moderate thoracic spondylosis. Partially visualized spinal fusion hardware in the lumbar spine and overlying the cervical spine on the scout topogram. IMPRESSION: 1. Spiculated posterior right upper lobe lung mass is mildly decreased in size since 10/17/2018 chest CT. 2. New vague clustered subsolid pulmonary nodules in the posterior right lower lobe, nonspecific, potentially early radiation change. Recurrent attention on close chest CT follow-up. 3. Right paratracheal lymphadenopathy is minimally decreased. 4. New mild circumferential wall thickening throughout the mid to upper thoracic esophagus, nonspecific, potentially treatment related. 5. Stable 4.6 cm ascending thoracic aortic aneurysm. Ascending thoracic aortic aneurysm. Recommend semi-annual imaging followup by CTA or MRA and referral to cardiothoracic surgery if not already obtained. This recommendation follows 2010 ACCF/AHA/AATS/ACR/ASA/SCA/SCAI/SIR/STS/SVM Guidelines for the Diagnosis and Management of Patients With Thoracic Aortic Disease. Circulation. 2010; 121: I433-I951. Aortic aneurysm NOS (ICD10-I71.9). Aortic Atherosclerosis (ICD10-I70.0) and Emphysema (ICD10-J43.9). Electronically Signed   By: Ilona Sorrel M.D.   On: 01/28/2019 09:53     ASSESSMENT/PLAN:  This is a very pleasant 63 year old Caucasian male with stage IIIa non-small cell lung cancer, adenocarcinoma  of the right upper lobe.  He was diagnosed in December 2019.  He completed weekly concurrent chemoradiation with carboplatin for an AUC of 2 and paclitaxel 45 mg/m.  He is status post 6 cycles.  The patient tolerated treatment well without any adverse effects except for mild odynophagia without any significant weight loss.   The patient was seen with Dr. Julien Nordmann today.  He recently had a restaging CT scan performed.  Dr. Earlie Server personally and  independently reviewed the scan results and discussed the results with the patient and his sister. The scan results showed a mild decrease in the right upper lobe lung mass and paratracheal lymphadenopathy.   Recommend that the patient start consolidation immunotherapy with Imfinzi 10 mg/kg IV every 2 weeks for 1 year unless the patient shows evidence of disease progression or unacceptable toxicity. The adverse side effects of immunotherapy were discussed including but not limited to immunotherapy mediated skin rash, diarrhea, inflammation of the lung, kidney, liver, thyroid or other endocrine dysfunction  Patient is interested in starting consolidation immunotherapy.  He is expected to receive his first dose of treatment next week on March 26th, 2020.  I will see him back for follow-up visit in 3 weeks before starting cycle #2 of treatment.    The patient's CT also noted a stable 4.6 cm ascending thoracic aortic aneurysm. The patient was referred to cardiothoracic surgery for evaluation and monitoring.   The patient was advised to call immediately if he has any concerning symptoms in the interval. The patient voices understanding of current disease status and treatment options and is in agreement with the current care plan. All questions were answered. The patient knows to call the clinic with any problems, questions or concerns. We can certainly see the patient much sooner if necessary   Orders Placed This Encounter  Procedures  . TSH    Standing Status:   Standing    Number of Occurrences:   12    Standing Expiration Date:   01/28/2020  . CBC with Differential (Cancer Center Only)    Standing Status:   Standing    Number of Occurrences:   26    Standing Expiration Date:   01/28/2020  . CMP ([facility] only)    Standing Status:   Standing    Number of Occurrences:   26    Standing Expiration Date:   01/28/2020  . Ambulatory referral to Cardiothoracic Surgery    Referral Priority:    Routine    Referral Type:   Surgical    Referral Reason:   Specialty Services Required    Referred to Provider:   Melrose Nakayama, MD    Requested Specialty:   Cardiothoracic Surgery    Number of Visits Requested:   [place], PA-C 01/28/19  ADDENDUM: Hematology/Oncology Attending: I had a face-to-face encounter with the patient today.  I recommended his care plan.  This is a very pleasant 63 years old white male diagnosed with a stage IIIa non-small cell lung cancer, adenocarcinoma in December 2019.  The patient completed a course of concurrent chemoradiation with weekly carboplatin and paclitaxel. He had repeat CT scan of the chest performed yesterday. I personally and independently reviewed the scan images and discussed the results with the patient and showed him the images. His scan showed mild improvement in the right upper lobe lung mass as well as the mediastinal lymphadenopathy. I discussed with the patient his treatment options including continuous observation  and close monitoring versus consideration of consolidation immunotherapy with Imfinzi 10 mg/KG every 2 weeks.  I discussed with the patient the adverse effect of the immunotherapy including but not limited to immunotherapy mediated skin rash, diarrhea, inflammation of the lung, kidney, liver, thyroid or other endocrine dysfunction. The patient is interested in proceeding with consolidation immunotherapy. He is expected to start the first cycle of this treatment next week. He will come back for follow-up visit in 3 weeks for evaluation with the start of cycle #2. He was advised to call immediately if he has any concerning symptoms in the interval. Disclaimer: This note was dictated with voice recognition software. Similar sounding words can inadvertently be transcribed and may be missed upon review. Eilleen Kempf, MD 01/28/19

## 2019-01-28 NOTE — Telephone Encounter (Signed)
Scheduled appt per 3/18 los.  Printed calendar and avs.

## 2019-02-05 ENCOUNTER — Inpatient Hospital Stay: Payer: Medicare Other

## 2019-02-05 ENCOUNTER — Other Ambulatory Visit: Payer: Self-pay

## 2019-02-05 VITALS — BP 122/76 | HR 88 | Temp 98.6°F | Resp 18

## 2019-02-05 DIAGNOSIS — R0609 Other forms of dyspnea: Secondary | ICD-10-CM | POA: Diagnosis not present

## 2019-02-05 DIAGNOSIS — C3411 Malignant neoplasm of upper lobe, right bronchus or lung: Secondary | ICD-10-CM | POA: Diagnosis not present

## 2019-02-05 DIAGNOSIS — Z79899 Other long term (current) drug therapy: Secondary | ICD-10-CM | POA: Diagnosis not present

## 2019-02-05 DIAGNOSIS — Z95828 Presence of other vascular implants and grafts: Secondary | ICD-10-CM

## 2019-02-05 DIAGNOSIS — C3491 Malignant neoplasm of unspecified part of right bronchus or lung: Secondary | ICD-10-CM

## 2019-02-05 DIAGNOSIS — R1310 Dysphagia, unspecified: Secondary | ICD-10-CM | POA: Diagnosis not present

## 2019-02-05 DIAGNOSIS — Z5112 Encounter for antineoplastic immunotherapy: Secondary | ICD-10-CM | POA: Diagnosis not present

## 2019-02-05 DIAGNOSIS — R05 Cough: Secondary | ICD-10-CM | POA: Diagnosis not present

## 2019-02-05 LAB — CBC WITH DIFFERENTIAL (CANCER CENTER ONLY)
Abs Immature Granulocytes: 0.08 10*3/uL — ABNORMAL HIGH (ref 0.00–0.07)
Basophils Absolute: 0.1 10*3/uL (ref 0.0–0.1)
Basophils Relative: 1 %
Eosinophils Absolute: 0.4 10*3/uL (ref 0.0–0.5)
Eosinophils Relative: 5 %
HCT: 35.9 % — ABNORMAL LOW (ref 39.0–52.0)
Hemoglobin: 11.3 g/dL — ABNORMAL LOW (ref 13.0–17.0)
Immature Granulocytes: 1 %
Lymphocytes Relative: 20 %
Lymphs Abs: 1.5 10*3/uL (ref 0.7–4.0)
MCH: 31.1 pg (ref 26.0–34.0)
MCHC: 31.5 g/dL (ref 30.0–36.0)
MCV: 98.9 fL (ref 80.0–100.0)
Monocytes Absolute: 1 10*3/uL (ref 0.1–1.0)
Monocytes Relative: 14 %
Neutro Abs: 4.5 10*3/uL (ref 1.7–7.7)
Neutrophils Relative %: 59 %
Platelet Count: 291 10*3/uL (ref 150–400)
RBC: 3.63 MIL/uL — ABNORMAL LOW (ref 4.22–5.81)
RDW: 17 % — ABNORMAL HIGH (ref 11.5–15.5)
WBC Count: 7.5 10*3/uL (ref 4.0–10.5)
nRBC: 0 % (ref 0.0–0.2)

## 2019-02-05 LAB — CMP (CANCER CENTER ONLY)
ALT: 16 U/L (ref 0–44)
AST: 19 U/L (ref 15–41)
Albumin: 3.2 g/dL — ABNORMAL LOW (ref 3.5–5.0)
Alkaline Phosphatase: 116 U/L (ref 38–126)
Anion gap: 9 (ref 5–15)
BUN: 13 mg/dL (ref 8–23)
CO2: 27 mmol/L (ref 22–32)
Calcium: 8.9 mg/dL (ref 8.9–10.3)
Chloride: 104 mmol/L (ref 98–111)
Creatinine: 1.09 mg/dL (ref 0.61–1.24)
GFR, Est AFR Am: >60 mL/min (ref >60)
GFR, Estimated: >60 mL/min (ref >60)
Glucose, Bld: 96 mg/dL (ref 70–99)
Potassium: 4.4 mmol/L (ref 3.5–5.1)
Sodium: 140 mmol/L (ref 135–145)
Total Bilirubin: 0.3 mg/dL (ref 0.3–1.2)
Total Protein: 7.1 g/dL (ref 6.5–8.1)

## 2019-02-05 LAB — TSH: TSH: 1.735 u[IU]/mL (ref 0.320–4.118)

## 2019-02-05 MED ORDER — SODIUM CHLORIDE 0.9% FLUSH
10.0000 mL | INTRAVENOUS | Status: DC | PRN
  Administered 2019-02-05: 10 mL
  Filled 2019-02-05: qty 10

## 2019-02-05 MED ORDER — SODIUM CHLORIDE 0.9 % IV SOLN
10.7000 mg/kg | Freq: Once | INTRAVENOUS | Status: AC
  Administered 2019-02-05: 1500 mg via INTRAVENOUS
  Filled 2019-02-05: qty 30

## 2019-02-05 MED ORDER — SODIUM CHLORIDE 0.9 % IV SOLN
Freq: Once | INTRAVENOUS | Status: AC
  Administered 2019-02-05: 16:00:00 via INTRAVENOUS
  Filled 2019-02-05: qty 250

## 2019-02-05 MED ORDER — SODIUM CHLORIDE 0.9% FLUSH
10.0000 mL | Freq: Once | INTRAVENOUS | Status: AC
  Administered 2019-02-05: 10 mL
  Filled 2019-02-05: qty 10

## 2019-02-05 MED ORDER — HEPARIN SOD (PORK) LOCK FLUSH 100 UNIT/ML IV SOLN
500.0000 [IU] | Freq: Once | INTRAVENOUS | Status: AC | PRN
  Administered 2019-02-05: 500 [IU]
  Filled 2019-02-05: qty 5

## 2019-02-05 NOTE — Patient Instructions (Signed)
Coronavirus (COVID-19) Are you at risk?  Are you at risk for the Coronavirus (COVID-19)?  To be considered HIGH RISK for Coronavirus (COVID-19), you have to meet the following criteria:  . Traveled to [place], [place], [place], [place] or [place]; or in the [place] to [place], [place], [place], or [place]; and have fever, cough, and shortness of breath within the last 2 weeks of travel OR . Been in close contact with a person diagnosed with COVID-19 within the last 2 weeks and have fever, cough, and shortness of breath . IF YOU DO NOT MEET THESE CRITERIA, YOU ARE CONSIDERED LOW RISK FOR COVID-19.  What to do if you are HIGH RISK for COVID-19?  . If you are having a medical emergency, call 911. . Seek medical care right away. Before you go to a doctor's office, urgent care or emergency department, call ahead and tell them about your recent travel, contact with someone diagnosed with COVID-19, and your symptoms. You should receive instructions from your physician's office regarding next steps of care.  . When you arrive at healthcare provider, tell the healthcare staff immediately you have returned from visiting [place], [place], [place], [place] or [place]; or traveled in the [place] to [place], [place], [place], or [place]; in the last two weeks or you have been in close contact with a person diagnosed with COVID-19 in the last 2 weeks.   . Tell the health care staff about your symptoms: fever, cough and shortness of breath. . After you have been seen by a medical provider, you will be either: o Tested for (COVID-19) and discharged home on quarantine except to seek medical care if symptoms worsen, and asked to  - Stay home and avoid contact with others until you get your results (4-5 days)  - Avoid travel on public transportation if possible (such as bus, train, or airplane) or o Sent to the Emergency Department by EMS for evaluation, COVID-19 testing, and possible  admission depending on your condition and test results.  What to do if you are LOW RISK for COVID-19?  Reduce your risk of any infection by using the same precautions used for avoiding the common cold or flu:  . Wash your hands often with soap and warm water for at least 20 seconds.  If soap and water are not readily available, use an alcohol-based hand sanitizer with at least 60% alcohol.  . If coughing or sneezing, cover your mouth and nose by coughing or sneezing into the elbow areas of your shirt or coat, into a tissue or into your sleeve (not your hands). . Avoid shaking hands with others and consider head nods or verbal greetings only. . Avoid touching your eyes, nose, or mouth with unwashed hands.  . Avoid close contact with people who are sick. . Avoid places or events with large numbers of people in one location, like concerts or sporting events. . Carefully consider travel plans you have or are making. . If you are planning any travel outside or inside the [place], visit the CDC's Travelers' Health webpage for the latest health notices. . If you have some symptoms but not all symptoms, continue to monitor at home and seek medical attention if your symptoms worsen. . If you are having a medical emergency, call 911.   ADDITIONAL HEALTHCARE OPTIONS FOR PATIENTS  [facility] / e-Visit: [email or web address]         [facility]: [phone]  [facility]: [facility]: [phone]       Durvalumab injection(Imfinzi) What is this medicine? DURVALUMAB (dur VAL ue mab) is a monoclonal antibody. It is used to treat urothelial cancer and lung cancer. This medicine may be used for other purposes; ask your health care provider or pharmacist if you have questions. COMMON BRAND NAME(S): IMFINZI What should I tell my health care provider before I take this medicine? They  need to know if you have any of these conditions: -diabetes -immune system problems -infection -inflammatory bowel disease -kidney disease -liver disease -lung or breathing disease -lupus -organ transplant -stomach or intestine problems -thyroid disease -an unusual or allergic reaction to durvalumab, other medicines, foods, dyes, or preservatives -pregnant or trying to get pregnant -breast-feeding How should I use this medicine? This medicine is for infusion into a vein. It is given by a health care professional in a hospital or clinic setting. A special MedGuide will be given to you before each treatment. Be sure to read this information carefully each time. Talk to your pediatrician regarding the use of this medicine in children. Special care may be needed. Overdosage: If you think you have taken too much of this medicine contact a poison control center or emergency room at once. NOTE: This medicine is only for you. Do not share this medicine with others. What if I miss a dose? It is important not to miss your dose. Call your doctor or health care professional if you are unable to keep an appointment. What may interact with this medicine? Interactions have not been studied. This list may not describe all possible interactions. Give your health care provider a list of all the medicines, herbs, non-prescription drugs, or dietary supplements you use. Also tell them if you smoke, drink alcohol, or use illegal drugs. Some items may interact with your medicine. What should I watch for while using this medicine? This drug may make you feel generally unwell. Continue your course of treatment even though you feel ill unless your doctor tells you to stop. You may need blood work done while you are taking this medicine. Do not become pregnant while taking this medicine or for 3 months after stopping it. Women should inform their doctor if they wish to become pregnant or think they might be  pregnant. There is a potential for serious side effects to an unborn child. Talk to your health care professional or pharmacist for more information. Do not breast-feed an infant while taking this medicine or for 3 months after stopping it. What side effects may I notice from receiving this medicine? Side effects that you should report to your doctor or health care professional as soon as possible: -allergic reactions like skin rash, itching or hives, swelling of the face, lips, or tongue -black, tarry stools -bloody or watery diarrhea -breathing problems -change in emotions or moods -change in sex drive -changes in vision -chest pain or chest tightness -chills -confusion -cough -facial flushing -fever -headache -signs and symptoms of high blood sugar such as dizziness; dry mouth; dry skin; fruity breath; nausea; stomach pain; increased hunger or thirst; increased urination -signs and symptoms of liver injury like dark yellow or brown urine; general ill feeling or flu-like symptoms; light-colored stools; loss of appetite; nausea; right upper belly pain; unusually weak or tired; yellowing of the eyes or skin -stomach pain -trouble passing urine or change  in the amount of urine -weight gain or weight loss Side effects that usually do not require medical attention (report these to your doctor or health care professional if they continue or are bothersome): -bone pain -constipation -loss of appetite -muscle pain -nausea -swelling of the ankles, feet, hands -tiredness This list may not describe all possible side effects. Call your doctor for medical advice about side effects. You may report side effects to FDA at 1-800-FDA-1088. Where should I keep my medicine? This drug is given in a hospital or clinic and will not be stored at home. NOTE: This sheet is a summary. It may not cover all possible information. If you have questions about this medicine, talk to your doctor, pharmacist, or  health care provider.  2019 Elsevier/Gold Standard (2017-01-08 19:25:04)

## 2019-02-17 ENCOUNTER — Encounter: Payer: Self-pay | Admitting: Thoracic Surgery (Cardiothoracic Vascular Surgery)

## 2019-02-19 ENCOUNTER — Other Ambulatory Visit: Payer: Self-pay

## 2019-02-19 ENCOUNTER — Encounter: Payer: Self-pay | Admitting: Physician Assistant

## 2019-02-19 ENCOUNTER — Inpatient Hospital Stay: Payer: Medicare Other

## 2019-02-19 ENCOUNTER — Inpatient Hospital Stay: Payer: Medicare Other | Attending: Internal Medicine | Admitting: Physician Assistant

## 2019-02-19 VITALS — BP 112/73 | HR 98 | Temp 98.8°F | Resp 18 | Ht 73.0 in | Wt 323.0 lb

## 2019-02-19 DIAGNOSIS — R0609 Other forms of dyspnea: Secondary | ICD-10-CM

## 2019-02-19 DIAGNOSIS — R609 Edema, unspecified: Secondary | ICD-10-CM

## 2019-02-19 DIAGNOSIS — Z5112 Encounter for antineoplastic immunotherapy: Secondary | ICD-10-CM | POA: Diagnosis not present

## 2019-02-19 DIAGNOSIS — C3411 Malignant neoplasm of upper lobe, right bronchus or lung: Secondary | ICD-10-CM | POA: Diagnosis not present

## 2019-02-19 DIAGNOSIS — Z79899 Other long term (current) drug therapy: Secondary | ICD-10-CM | POA: Insufficient documentation

## 2019-02-19 DIAGNOSIS — I38 Endocarditis, valve unspecified: Secondary | ICD-10-CM | POA: Diagnosis not present

## 2019-02-19 DIAGNOSIS — R635 Abnormal weight gain: Secondary | ICD-10-CM

## 2019-02-19 DIAGNOSIS — C3491 Malignant neoplasm of unspecified part of right bronchus or lung: Secondary | ICD-10-CM

## 2019-02-19 DIAGNOSIS — Z95828 Presence of other vascular implants and grafts: Secondary | ICD-10-CM

## 2019-02-19 LAB — CMP (CANCER CENTER ONLY)
ALT: 20 U/L (ref 0–44)
AST: 22 U/L (ref 15–41)
Albumin: 3.3 g/dL — ABNORMAL LOW (ref 3.5–5.0)
Alkaline Phosphatase: 94 U/L (ref 38–126)
Anion gap: 11 (ref 5–15)
BUN: 15 mg/dL (ref 8–23)
CO2: 25 mmol/L (ref 22–32)
Calcium: 8.9 mg/dL (ref 8.9–10.3)
Chloride: 104 mmol/L (ref 98–111)
Creatinine: 0.99 mg/dL (ref 0.61–1.24)
GFR, Est AFR Am: >60 mL/min (ref >60)
GFR, Estimated: >60 mL/min (ref >60)
Glucose, Bld: 94 mg/dL (ref 70–99)
Potassium: 4.1 mmol/L (ref 3.5–5.1)
Sodium: 140 mmol/L (ref 135–145)
Total Bilirubin: 0.6 mg/dL (ref 0.3–1.2)
Total Protein: 7 g/dL (ref 6.5–8.1)

## 2019-02-19 LAB — CBC WITH DIFFERENTIAL (CANCER CENTER ONLY)
Abs Immature Granulocytes: 0.04 10*3/uL (ref 0.00–0.07)
Basophils Absolute: 0 10*3/uL (ref 0.0–0.1)
Basophils Relative: 1 %
Eosinophils Absolute: 0.5 10*3/uL (ref 0.0–0.5)
Eosinophils Relative: 7 %
HCT: 36.2 % — ABNORMAL LOW (ref 39.0–52.0)
Hemoglobin: 11.7 g/dL — ABNORMAL LOW (ref 13.0–17.0)
Immature Granulocytes: 1 %
Lymphocytes Relative: 20 %
Lymphs Abs: 1.4 10*3/uL (ref 0.7–4.0)
MCH: 31.3 pg (ref 26.0–34.0)
MCHC: 32.3 g/dL (ref 30.0–36.0)
MCV: 96.8 fL (ref 80.0–100.0)
Monocytes Absolute: 0.9 10*3/uL (ref 0.1–1.0)
Monocytes Relative: 13 %
Neutro Abs: 4 10*3/uL (ref 1.7–7.7)
Neutrophils Relative %: 58 %
Platelet Count: 270 10*3/uL (ref 150–400)
RBC: 3.74 MIL/uL — ABNORMAL LOW (ref 4.22–5.81)
RDW: 16.1 % — ABNORMAL HIGH (ref 11.5–15.5)
WBC Count: 6.8 10*3/uL (ref 4.0–10.5)
nRBC: 0 % (ref 0.0–0.2)

## 2019-02-19 MED ORDER — SODIUM CHLORIDE 0.9 % IV SOLN
Freq: Once | INTRAVENOUS | Status: AC
  Administered 2019-02-19: 15:00:00 via INTRAVENOUS
  Filled 2019-02-19: qty 250

## 2019-02-19 MED ORDER — HEPARIN SOD (PORK) LOCK FLUSH 100 UNIT/ML IV SOLN
500.0000 [IU] | Freq: Once | INTRAVENOUS | Status: AC | PRN
  Administered 2019-02-19: 500 [IU]
  Filled 2019-02-19: qty 5

## 2019-02-19 MED ORDER — SODIUM CHLORIDE 0.9% FLUSH
10.0000 mL | Freq: Once | INTRAVENOUS | Status: AC
  Administered 2019-02-19: 10 mL
  Filled 2019-02-19: qty 10

## 2019-02-19 MED ORDER — SODIUM CHLORIDE 0.9% FLUSH
10.0000 mL | INTRAVENOUS | Status: DC | PRN
  Administered 2019-02-19: 10 mL
  Filled 2019-02-19: qty 10

## 2019-02-19 MED ORDER — SODIUM CHLORIDE 0.9 % IV SOLN
10.7000 mg/kg | Freq: Once | INTRAVENOUS | Status: AC
  Administered 2019-02-19: 1500 mg via INTRAVENOUS
  Filled 2019-02-19: qty 30

## 2019-02-19 NOTE — Progress Notes (Signed)
[facility] OFFICE PROGRESS NOTE  [name], MD [place] [address]  DIAGNOSIS: Stage IIIa (T2b, N2, M0) non-small cell lung cancer, favoring adenocarcinoma presented with right upper lobe lung mass in addition to right paratracheal lymphadenopathy diagnosed in December 2019. The patient also has a hypermetabolic lesion in the left parotid gland that need further evaluation.  PRIOR THERAPY:  1) Concurrent chemoradiation with weekly carboplatin for AUC of 2 and paclitaxel 45 mg/M2.Status post6 cycles. Last treatment February 24th, 2020.  2) Radiation closer to home in [place], [place]. Last treatment January 12, 2019  CURRENT THERAPY: Consolidation Immunotherapy with Imfinzi 10 mg/kg IV every 2 weeks. First dose expected on March 26th, 2020. Status post 1 cycle.   INTERVAL HISTORY: Alexander Hatfield. 63 y.o. male returns to the clinic today for a follow-up visit.  He completed his first cycle of consolidation immunotherapy with Imfinzi and tolerated it well without any adverse effects.  Since his last visit, the patient's odynophagia secondary to radiation therapy has significantly improved.  He no longer experiences pain with swallowing.  He actually gained approximately 13 pounds since his last visit 3 weeks ago.  The patient attributes this to eating more food while under quarantine; however, per patient report, he has a history of needing a valve replacement. He does have an appointment with cardiothoracic surgery next month, however, the patient does not have a regular cardiologist. He takes 20 mg of furosemide for his blood pressure.   The patient also endorses dyspnea on exertion. He states he used to be fairly active, however, he now gets extremely short of breath with just ambulating in his home. In December 2019, the patient was attempting to perform a pulmonary function test, however, he had difficulty performing the test and the pulmonology  team was unable to get results after multiple attempts.  He denies any associated chest pain, cough, or hemoptysis.  Otherwise, the patient is feeling fairly well today.  He denies any fever, chills, night sweats.  He denies any nausea, vomiting, diarrhea, or constipation.  Denies any headache or visual changes.  He denies any rashes or skin changes.  He is here for evaluation before starting cycle #2 of Imfinzi today.   MEDICAL HISTORY: Past Medical History:  Diagnosis Date  . Arthritis   . Chronic back pain   . Chronic back pain   . Headache   . HOH (hard of hearing)   . Hypertension   . Lung mass   . Pre-diabetes   . Sleep apnea    wears cpap  . Wears glasses   . Wears partial dentures     ALLERGIES:  is allergic to iohexol.  MEDICATIONS:  Current Outpatient Medications  Medication Sig Dispense Refill  . amLODipine (NORVASC) 10 MG tablet Take 10 mg by mouth daily.     . cyclobenzaprine (FLEXERIL) 10 MG tablet Take 1 tablet (10 mg total) by mouth 3 (three) times daily as needed for muscle spasms. 30 tablet 0  . furosemide (LASIX) 20 MG tablet Take 20 mg by mouth daily.     Marland Kitchen gabapentin (NEURONTIN) 300 MG capsule Take 600 mg by mouth 3 (three) times daily.     Marland Kitchen losartan (COZAAR) 100 MG tablet Take 100 mg by mouth daily.     . magic mouthwash w/lidocaine SOLN Take by mouth.    . meloxicam (MOBIC) 15 MG tablet Take 15 mg by mouth daily.     Marland Kitchen oxycodone (ROXICODONE)  30 MG immediate release tablet One to two tablets every 8 hours as needed for pain.  Max 5 tab/day.  Must last 30 days.    . potassium chloride SA (K-DUR,KLOR-CON) 20 MEQ tablet Take 20 mEq by mouth every evening.     . prochlorperazine (COMPAZINE) 10 MG tablet Take 1 tablet (10 mg total) by mouth every 6 (six) hours as needed for nausea or vomiting. 30 tablet 0  . pseudoephedrine (SUDAFED) 120 MG 12 hr tablet Take 120 mg by mouth 2 (two) times daily as needed for congestion.    . lidocaine-prilocaine (EMLA) cream  Apply 1 application topically as needed. (Patient not taking: Reported on 02/19/2019) 30 g 0   No current facility-administered medications for this visit.    Facility-Administered Medications Ordered in Other Visits  Medication Dose Route Frequency Provider Last Rate Last Dose  . durvalumab (IMFINZI) 1,500 mg in sodium chloride 0.9 % 100 mL chemo infusion  10.7 mg/kg (Treatment Plan Recorded) Intravenous Once Curt Bears, MD 130 mL/hr at 02/19/19 1540 1,500 mg at 02/19/19 1540  . heparin lock flush 100 unit/mL  500 Units Intracatheter Once PRN Curt Bears, MD      . sodium chloride flush (NS) 0.9 % injection 10 mL  10 mL Intracatheter PRN Curt Bears, MD        SURGICAL HISTORY:  Past Surgical History:  Procedure Laterality Date  . ANTERIOR CERVICAL DECOMP/DISCECTOMY FUSION N/A 10/27/2018   Procedure: ANTERIOR CERVICAL THREE-FOUR, FOUR-FIVE DECOMPRESSION/DISCECTOMY FUSION TWO LEVELS;  Surgeon: Kary Kos, MD;  Location: [facility];  Service: Neurosurgery;  Laterality: N/A;   ANTERIOR CERVICAL THREE-FOUR, FOUR-FIVE DECOMPRESSION/DISCECTOMY FUSION TWO LEVELS  . BACK SURGERY    . IR IMAGING GUIDED PORT INSERTION  11/27/2018  . MULTIPLE TOOTH EXTRACTIONS    . SPINAL CORD STIMULATOR IMPLANT    . VIDEO BRONCHOSCOPY N/A 10/29/2018   Procedure: VIDEO BRONCHOSCOPY;  Surgeon: Laurin Coder, MD;  Location: [facility];  Service: Thoracic;  Laterality: N/A;  . VIDEO BRONCHOSCOPY WITH ENDOBRONCHIAL ULTRASOUND N/A 10/27/2018   Procedure: VIDEO BRONCHOSCOPY WITH ENDOBRONCHIAL ULTRASOUND;  Surgeon: Laurin Coder, MD;  Location: [facility];  Service: Thoracic;  Laterality: N/A;  . VIDEO BRONCHOSCOPY WITH ENDOBRONCHIAL ULTRASOUND N/A 10/29/2018   Procedure: VIDEO BRONCHOSCOPY WITH ENDOBRONCHIAL ULTRASOUND;  Surgeon: Laurin Coder, MD;  Location: [facility];  Service: Thoracic;  Laterality: N/A;    REVIEW OF SYSTEMS:   Review of Systems  Constitutional: Positive for weight gain. Negative for  appetite change, chills, fatigue, or fever HENT:   Negative for mouth sores, nosebleeds, sore throat and trouble swallowing.   Eyes: Negative for eye problems and icterus.  Respiratory: Positive for dyspnea on exertion. Negative for cough, hemoptysis, and wheezing.   Cardiovascular: Negative for chest pain and leg swelling.  Gastrointestinal: Negative for abdominal pain, constipation, diarrhea, nausea and vomiting.  Genitourinary: Negative for bladder incontinence, difficulty urinating, dysuria, frequency and hematuria.   Musculoskeletal: Negative for back pain, gait problem, neck pain and neck stiffness.  Skin: Negative for itching and rash.  Neurological: Negative for dizziness, extremity weakness, gait problem, headaches, light-headedness and seizures.  Hematological: Negative for adenopathy. Does not bruise/bleed easily.  Psychiatric/Behavioral: Negative for confusion, depression and sleep disturbance. The patient is not nervous/anxious.     PHYSICAL EXAMINATION:  Blood pressure 112/73, pulse 98, temperature 98.8 F (37.1 C), temperature source Oral, resp. rate 18, height 6\' 1"  (1.854 m), weight (!) 323 lb (146.5 kg), SpO2 96 %.  ECOG PERFORMANCE STATUS: 1 -  Symptomatic but completely ambulatory  Physical Exam  Constitutional: Oriented to person, place, and time and well-developed, well-nourished, and in no distress.  HENT:  Head: Normocephalic and atraumatic.  Mouth/Throat: Oropharynx is clear and moist. No oropharyngeal exudate.  Eyes: Conjunctivae are normal. Right eye exhibits no discharge. Left eye exhibits no discharge. No scleral icterus.  Neck: Normal range of motion. Neck supple.  Cardiovascular: Normal rate, regular rhythm, normal heart sounds and intact distal pulses.   Pulmonary/Chest: Effort normal and breath sounds normal. No respiratory distress. No wheezes. No rales.  Abdominal: Soft. Bowel sounds are normal. Exhibits no distension and no mass. There is no tenderness.   Musculoskeletal: Normal range of motion. Exhibits no edema.  Lymphadenopathy:    No cervical adenopathy.  Neurological: Alert and oriented to person, place, and time. Exhibits normal muscle tone. Gait normal. Coordination normal.  Skin: Skin is warm and dry. No rash noted. Not diaphoretic. No erythema. No pallor.  Psychiatric: Mood, memory and judgment normal.  Vitals reviewed.  LABORATORY DATA: Lab Results  Component Value Date   WBC 6.8 02/19/2019   HGB 11.7 (L) 02/19/2019   HCT 36.2 (L) 02/19/2019   MCV 96.8 02/19/2019   PLT 270 02/19/2019      Chemistry      Component Value Date/Time   NA 140 02/19/2019 1318   K 4.1 02/19/2019 1318   CL 104 02/19/2019 1318   CO2 25 02/19/2019 1318   BUN 15 02/19/2019 1318   CREATININE 0.99 02/19/2019 1318      Component Value Date/Time   CALCIUM 8.9 02/19/2019 1318   ALKPHOS 94 02/19/2019 1318   AST 22 02/19/2019 1318   ALT 20 02/19/2019 1318   BILITOT 0.6 02/19/2019 1318       RADIOGRAPHIC STUDIES:  Ct Chest Wo Contrast  Result Date: 01/28/2019 CLINICAL DATA:  Stage IIIA right upper lobe squamous cell lung cancer diagnosed December 2019 with interval concurrent chemotherapy and radiation therapy. Restaging. EXAM: CT CHEST WITHOUT CONTRAST TECHNIQUE: Multidetector CT imaging of the chest was performed following the standard protocol without IV contrast. COMPARISON:  10/17/2018 chest CT.  10/24/2018 PET-CT. FINDINGS: Cardiovascular: Normal heart size. No significant pericardial effusion/thickening. Three-vessel coronary atherosclerosis. Right internal jugular Port-A-Cath terminates at the cavoatrial junction. Atherosclerotic thoracic aorta with stable 4.6 cm ascending thoracic aortic aneurysm. Top-normal main pulmonary artery (3.3 cm diameter), stable. Mediastinum/Nodes: Stable 3.7 cm heterogeneous right thyroid nodule. New mild circumferential wall thickening throughout the mid to upper thoracic esophagus (series 2/image 41). No  axillary adenopathy. Enlarged 2.2 cm right paratracheal node (series 2/image 56), previously 2.4 cm, minimally decreased. No new pathologically enlarged mediastinal nodes. No discrete hilar adenopathy on this noncontrast scan. Lungs/Pleura: No pneumothorax. No pleural effusion. Spiculated posterior right upper lobe 3.5 x 3.0 cm lung mass (series 5/image 62), decreased from 4.0 x 3.5 cm on 10/17/2018 chest CT. New vague clustered subsolid pulmonary nodules in the posterior right lower lobe, largest 1.3 cm (series 5/image 103). Scattered subcentimeter calcified granulomas in both lungs. No additional new significant pulmonary nodules. Mild centrilobular emphysema with mild diffuse bronchial wall thickening. Upper abdomen: Stable thick calcifications at the periphery of the spleen compatible with remote insult. Musculoskeletal: No aggressive appearing focal osseous lesions. Symmetric mild to moderate bilateral gynecomastia, unchanged. Inferior approach spinal stimulator lead is seen terminating in the posterior lower thoracic spinal canal. Moderate thoracic spondylosis. Partially visualized spinal fusion hardware in the lumbar spine and overlying the cervical spine on the scout topogram. IMPRESSION: 1. Spiculated posterior  right upper lobe lung mass is mildly decreased in size since 10/17/2018 chest CT. 2. New vague clustered subsolid pulmonary nodules in the posterior right lower lobe, nonspecific, potentially early radiation change. Recurrent attention on close chest CT follow-up. 3. Right paratracheal lymphadenopathy is minimally decreased. 4. New mild circumferential wall thickening throughout the mid to upper thoracic esophagus, nonspecific, potentially treatment related. 5. Stable 4.6 cm ascending thoracic aortic aneurysm. Ascending thoracic aortic aneurysm. Recommend semi-annual imaging followup by CTA or MRA and referral to cardiothoracic surgery if not already obtained. This recommendation follows 2010  ACCF/AHA/AATS/ACR/ASA/SCA/SCAI/SIR/STS/SVM Guidelines for the Diagnosis and Management of Patients With Thoracic Aortic Disease. Circulation. 2010; 121: P950-D326. Aortic aneurysm NOS (ICD10-I71.9). Aortic Atherosclerosis (ICD10-I70.0) and Emphysema (ICD10-J43.9). Electronically Signed   By: Ilona Sorrel M.D.   On: 01/28/2019 09:53     ASSESSMENT/PLAN:  This is a very pleasant 63 year old Caucasian male with stage IIIa non-small cell lung cancer, adenocarcinoma of the right upper lobe.  He was diagnosed in December 2019.  He completed weekly concurrent chemoradiation with carboplatin for an AUC of 2 and paclitaxel 45 mg/m.  He is status post 6 cycles.  The patient tolerated treatment well without any adverse effects except for mild odynophagia without any significant weight loss.  The patient is currently undergoing consolidation immunotherapy with Imfinzi 10 mg/kg IV every 2 weeks.  He is status post 1 cycle.  He is tolerating treatment well without any adverse side effects.  Patient was seen with Dr. Julien Nordmann today.  The labs were reviewed with the patient.  We recommend he proceed with cycle #2 today as scheduled  We will see him back in 2 weeks for evaluation before starting cycle #3  The patient was instructed to keep his appointment with Dr. Roxan Hockey from cardiothoracic surgery on Mar 17, 2019.  The patient was encouraged to try to lose weight through diet and exercise. The patient states he has a stationary bike at home and will try to exercise using that. He will continue with his lasix for his blood pressure.   The patient was advised to call immediately if he has any concerning symptoms in the interval. The patient voices understanding of current disease status and treatment options and is in agreement with the current care plan. All questions were answered. The patient knows to call the clinic with any problems, questions or concerns. We can certainly see the patient much sooner if  necessary  No orders of the defined types were placed in this encounter.    Shron Ozer L Lakrista Scaduto, PA-C 02/19/19  ADDENDUM: Hematology/Oncology Attending: I had a face-to-face encounter with the patient.  I recommended his care plan.  This is a very pleasant 63 years old white male with a stage IIIa non-small cell lung cancer, adenocarcinoma status post a course of concurrent chemoradiation with partial response.  The patient is currently undergoing consolidation treatment with immunotherapy with Imfinzi status post 1 cycle.  He tolerated the first cycle of his treatment well.  The patient complains of increasing fatigue as well as with gain of around 20 pounds in the last several weeks.  He is currently on diuretic but he is not followed by cardiology.  He has a history of valve disease and he is scheduled to see Dr. Roxan Hockey on Mar 17, 2019. I recommended for the patient to continue his current treatment with Imfinzi and he will proceed with cycle #2 today. I also recommended for the patient to continue on Lasix for now and to discuss  with his primary care physician referral to cardiology for further evaluation of his fluid retention. He will come back for follow-up visit in 3 weeks for evaluation before the next cycle of his treatment. The patient was advised to call immediately if he has any concerning symptoms in the interval.  Disclaimer: This note was dictated with voice recognition software. Similar sounding words can inadvertently be transcribed and may be missed upon review. Eilleen Kempf, MD 02/19/19

## 2019-02-19 NOTE — Patient Instructions (Signed)
Coronavirus (COVID-19) Are you at risk?  Are you at risk for the Coronavirus (COVID-19)?  To be considered HIGH RISK for Coronavirus (COVID-19), you have to meet the following criteria:  . Traveled to [place], [place], [place], [place] or [place]; or in the [place] to [place], [place], [place], or [place]; and have fever, cough, and shortness of breath within the last 2 weeks of travel OR . Been in close contact with a person diagnosed with COVID-19 within the last 2 weeks and have fever, cough, and shortness of breath . IF YOU DO NOT MEET THESE CRITERIA, YOU ARE CONSIDERED LOW RISK FOR COVID-19.  What to do if you are HIGH RISK for COVID-19?  . If you are having a medical emergency, call 911. . Seek medical care right away. Before you go to a doctor's office, urgent care or emergency department, call ahead and tell them about your recent travel, contact with someone diagnosed with COVID-19, and your symptoms. You should receive instructions from your physician's office regarding next steps of care.  . When you arrive at healthcare provider, tell the healthcare staff immediately you have returned from visiting [place], [place], [place], [place] or [place]; or traveled in the [place] to [place], [place], [place], or [place]; in the last two weeks or you have been in close contact with a person diagnosed with COVID-19 in the last 2 weeks.   . Tell the health care staff about your symptoms: fever, cough and shortness of breath. . After you have been seen by a medical provider, you will be either: o Tested for (COVID-19) and discharged home on quarantine except to seek medical care if symptoms worsen, and asked to  - Stay home and avoid contact with others until you get your results (4-5 days)  - Avoid travel on public transportation if possible (such as bus, train, or airplane) or o Sent to the Emergency Department by EMS for evaluation, COVID-19 testing, and possible  admission depending on your condition and test results.  What to do if you are LOW RISK for COVID-19?  Reduce your risk of any infection by using the same precautions used for avoiding the common cold or flu:  . Wash your hands often with soap and warm water for at least 20 seconds.  If soap and water are not readily available, use an alcohol-based hand sanitizer with at least 60% alcohol.  . If coughing or sneezing, cover your mouth and nose by coughing or sneezing into the elbow areas of your shirt or coat, into a tissue or into your sleeve (not your hands). . Avoid shaking hands with others and consider head nods or verbal greetings only. . Avoid touching your eyes, nose, or mouth with unwashed hands.  . Avoid close contact with people who are sick. . Avoid places or events with large numbers of people in one location, like concerts or sporting events. . Carefully consider travel plans you have or are making. . If you are planning any travel outside or inside the [place], visit the CDC's Travelers' Health webpage for the latest health notices. . If you have some symptoms but not all symptoms, continue to monitor at home and seek medical attention if your symptoms worsen. . If you are having a medical emergency, call 911.   ADDITIONAL HEALTHCARE OPTIONS FOR PATIENTS  [facility] / e-Visit: [email or web address]         [facility]: [phone]  [place]   Urgent Care: [facility]: [facility] Discharge Instructions for Patients Receiving Chemotherapy  Today you received the following chemotherapy agents Imfinzi   To help prevent nausea and vomiting after your treatment, we encourage you to take your nausea medication as directed.    If you develop nausea and vomiting that is not controlled by your nausea medication, call the clinic.   BELOW ARE  SYMPTOMS THAT SHOULD BE REPORTED IMMEDIATELY:  *FEVER GREATER THAN 100.5 F  *CHILLS WITH OR WITHOUT FEVER  NAUSEA AND VOMITING THAT IS NOT CONTROLLED WITH YOUR NAUSEA MEDICATION  *UNUSUAL SHORTNESS OF BREATH  *UNUSUAL BRUISING OR BLEEDING  TENDERNESS IN MOUTH AND THROAT WITH OR WITHOUT PRESENCE OF ULCERS  *URINARY PROBLEMS  *BOWEL PROBLEMS  UNUSUAL RASH Items with * indicate a potential emergency and should be followed up as soon as possible.  Feel free to call the clinic should you have any questions or concerns. The clinic phone number is [phone].  Please show the [facility] at check-in to the Emergency Department and triage nurse.

## 2019-03-05 ENCOUNTER — Encounter: Payer: Self-pay | Admitting: Internal Medicine

## 2019-03-05 ENCOUNTER — Inpatient Hospital Stay: Payer: Medicare Other | Admitting: Internal Medicine

## 2019-03-05 ENCOUNTER — Inpatient Hospital Stay: Payer: Medicare Other

## 2019-03-05 ENCOUNTER — Other Ambulatory Visit: Payer: Self-pay

## 2019-03-05 VITALS — BP 129/75 | HR 100 | Temp 99.4°F | Resp 18 | Ht 73.0 in | Wt 320.3 lb

## 2019-03-05 VITALS — Temp 99.8°F

## 2019-03-05 DIAGNOSIS — C3491 Malignant neoplasm of unspecified part of right bronchus or lung: Secondary | ICD-10-CM

## 2019-03-05 DIAGNOSIS — C3411 Malignant neoplasm of upper lobe, right bronchus or lung: Secondary | ICD-10-CM | POA: Diagnosis not present

## 2019-03-05 DIAGNOSIS — Z95828 Presence of other vascular implants and grafts: Secondary | ICD-10-CM

## 2019-03-05 DIAGNOSIS — R05 Cough: Secondary | ICD-10-CM

## 2019-03-05 DIAGNOSIS — Z79899 Other long term (current) drug therapy: Secondary | ICD-10-CM | POA: Diagnosis not present

## 2019-03-05 DIAGNOSIS — Z5112 Encounter for antineoplastic immunotherapy: Secondary | ICD-10-CM

## 2019-03-05 DIAGNOSIS — R0609 Other forms of dyspnea: Secondary | ICD-10-CM | POA: Diagnosis not present

## 2019-03-05 LAB — CBC WITH DIFFERENTIAL (CANCER CENTER ONLY)
Abs Immature Granulocytes: 0.03 10*3/uL (ref 0.00–0.07)
Basophils Absolute: 0 10*3/uL (ref 0.0–0.1)
Basophils Relative: 1 %
Eosinophils Absolute: 0.5 10*3/uL (ref 0.0–0.5)
Eosinophils Relative: 7 %
HCT: 38.6 % — ABNORMAL LOW (ref 39.0–52.0)
Hemoglobin: 12.5 g/dL — ABNORMAL LOW (ref 13.0–17.0)
Immature Granulocytes: 0 %
Lymphocytes Relative: 19 %
Lymphs Abs: 1.5 10*3/uL (ref 0.7–4.0)
MCH: 30.6 pg (ref 26.0–34.0)
MCHC: 32.4 g/dL (ref 30.0–36.0)
MCV: 94.6 fL (ref 80.0–100.0)
Monocytes Absolute: 0.9 10*3/uL (ref 0.1–1.0)
Monocytes Relative: 12 %
Neutro Abs: 4.6 10*3/uL (ref 1.7–7.7)
Neutrophils Relative %: 61 %
Platelet Count: 284 10*3/uL (ref 150–400)
RBC: 4.08 MIL/uL — ABNORMAL LOW (ref 4.22–5.81)
RDW: 14.1 % (ref 11.5–15.5)
WBC Count: 7.6 10*3/uL (ref 4.0–10.5)
nRBC: 0 % (ref 0.0–0.2)

## 2019-03-05 LAB — CMP (CANCER CENTER ONLY)
ALT: 12 U/L (ref 0–44)
AST: 20 U/L (ref 15–41)
Albumin: 3.6 g/dL (ref 3.5–5.0)
Alkaline Phosphatase: 116 U/L (ref 38–126)
Anion gap: 10 (ref 5–15)
BUN: 13 mg/dL (ref 8–23)
CO2: 25 mmol/L (ref 22–32)
Calcium: 9.2 mg/dL (ref 8.9–10.3)
Chloride: 104 mmol/L (ref 98–111)
Creatinine: 0.94 mg/dL (ref 0.61–1.24)
GFR, Est AFR Am: >60 mL/min (ref >60)
GFR, Estimated: >60 mL/min (ref >60)
Glucose, Bld: 84 mg/dL (ref 70–99)
Potassium: 4.3 mmol/L (ref 3.5–5.1)
Sodium: 139 mmol/L (ref 135–145)
Total Bilirubin: 0.4 mg/dL (ref 0.3–1.2)
Total Protein: 7.8 g/dL (ref 6.5–8.1)

## 2019-03-05 LAB — TSH: TSH: 0.898 u[IU]/mL (ref 0.320–4.118)

## 2019-03-05 MED ORDER — SODIUM CHLORIDE 0.9% FLUSH
10.0000 mL | Freq: Once | INTRAVENOUS | Status: AC
  Administered 2019-03-05: 12:00:00 10 mL
  Filled 2019-03-05: qty 10

## 2019-03-05 MED ORDER — SODIUM CHLORIDE 0.9 % IV SOLN
10.6000 mg/kg | Freq: Once | INTRAVENOUS | Status: AC
  Administered 2019-03-05: 1500 mg via INTRAVENOUS
  Filled 2019-03-05: qty 30

## 2019-03-05 MED ORDER — SODIUM CHLORIDE 0.9% FLUSH
10.0000 mL | INTRAVENOUS | Status: DC | PRN
  Administered 2019-03-05: 10 mL
  Filled 2019-03-05: qty 10

## 2019-03-05 MED ORDER — HEPARIN SOD (PORK) LOCK FLUSH 100 UNIT/ML IV SOLN
500.0000 [IU] | Freq: Once | INTRAVENOUS | Status: AC | PRN
  Administered 2019-03-05: 500 [IU]
  Filled 2019-03-05: qty 5

## 2019-03-05 MED ORDER — SODIUM CHLORIDE 0.9 % IV SOLN
Freq: Once | INTRAVENOUS | Status: AC
  Administered 2019-03-05: 13:00:00 via INTRAVENOUS
  Filled 2019-03-05: qty 250

## 2019-03-05 NOTE — Progress Notes (Signed)
[facility] [phone]   [phone]  OFFICE PROGRESS NOTE  [name], MD [place] [address]  DIAGNOSIS: stage IIIa (T2b, N2, M0) non-small cell lung cancer, squamous cell carcinoma presented with right upper lobe lung mass in addition to right paratracheal lymphadenopathy diagnosed in December 2019. The patient also has a hypermetabolic lesion in the left parotid gland that need further evaluation.  PRIOR THERAPY:  concurrent chemoradiation with weekly carboplatin for AUC of 2 and paclitaxel 45 mg/M2.  First dose started on 12/02/2018. Status post 6 cycles.  Last cycle was given on January 05, 2019 the patient is receiving radiation closer to home in [place], [place] completed January 12, 2019.   CURRENT THERAPY:  Consolidation treatment with immunotherapy with Imfinzi 10 mg/KG IV every 2 weeks.  First dose was given February 05, 2019. Status post 2 cycles.  INTERVAL HISTORY: Alexander Hatfield. 63 y.o. male returns to the clinic today for follow-up visit.  His wife was available by phone.  The patient is feeling fine today with no concerning complaints except for shortness of breath with exertion and mild cough.  He was able to play golf recently.  He denied having any chest pain, or hemoptysis.  He has no recent weight loss or night sweats.  He has no nausea, vomiting, diarrhea or constipation.  He has no headache or visual changes.  He has been tolerating his treatment with Imfinzi fairly well.  He is here today for evaluation before starting cycle #3.   MEDICAL HISTORY: Past Medical History:  Diagnosis Date  . Arthritis   . Chronic back pain   . Chronic back pain   . Headache   . HOH (hard of hearing)   . Hypertension   . Lung mass   . Pre-diabetes   . Sleep apnea    wears cpap  . Wears glasses   . Wears partial dentures     ALLERGIES:  is allergic to iohexol.  MEDICATIONS:  Current Outpatient Medications   Medication Sig Dispense Refill  . amLODipine (NORVASC) 10 MG tablet Take 10 mg by mouth daily.     . cyclobenzaprine (FLEXERIL) 10 MG tablet Take 1 tablet (10 mg total) by mouth 3 (three) times daily as needed for muscle spasms. 30 tablet 0  . furosemide (LASIX) 20 MG tablet Take 20 mg by mouth daily.     Marland Kitchen gabapentin (NEURONTIN) 300 MG capsule Take 600 mg by mouth 3 (three) times daily.     Marland Kitchen lidocaine-prilocaine (EMLA) cream Apply 1 application topically as needed. (Patient not taking: Reported on 02/19/2019) 30 g 0  . losartan (COZAAR) 100 MG tablet Take 100 mg by mouth daily.     . magic mouthwash w/lidocaine SOLN Take by mouth.    . meloxicam (MOBIC) 15 MG tablet Take 15 mg by mouth daily.     Marland Kitchen oxycodone (ROXICODONE) 30 MG immediate release tablet One to two tablets every 8 hours as needed for pain.  Max 5 tab/day.  Must last 30 days.    . potassium chloride SA (K-DUR,KLOR-CON) 20 MEQ tablet Take 20 mEq by mouth every evening.     . prochlorperazine (COMPAZINE) 10 MG tablet Take 1 tablet (10 mg total) by mouth every 6 (six) hours as needed for nausea or vomiting. 30 tablet 0  . pseudoephedrine (SUDAFED) 120 MG 12 hr tablet Take 120 mg by mouth 2 (two) times daily as needed for congestion.  No current facility-administered medications for this visit.     SURGICAL HISTORY:  Past Surgical History:  Procedure Laterality Date  . ANTERIOR CERVICAL DECOMP/DISCECTOMY FUSION N/A 10/27/2018   Procedure: ANTERIOR CERVICAL THREE-FOUR, FOUR-FIVE DECOMPRESSION/DISCECTOMY FUSION TWO LEVELS;  Surgeon: Kary Kos, MD;  Location: [facility];  Service: Neurosurgery;  Laterality: N/A;   ANTERIOR CERVICAL THREE-FOUR, FOUR-FIVE DECOMPRESSION/DISCECTOMY FUSION TWO LEVELS  . BACK SURGERY    . IR IMAGING GUIDED PORT INSERTION  11/27/2018  . MULTIPLE TOOTH EXTRACTIONS    . SPINAL CORD STIMULATOR IMPLANT    . VIDEO BRONCHOSCOPY N/A 10/29/2018   Procedure: VIDEO BRONCHOSCOPY;  Surgeon: Laurin Coder, MD;   Location: [facility];  Service: Thoracic;  Laterality: N/A;  . VIDEO BRONCHOSCOPY WITH ENDOBRONCHIAL ULTRASOUND N/A 10/27/2018   Procedure: VIDEO BRONCHOSCOPY WITH ENDOBRONCHIAL ULTRASOUND;  Surgeon: Laurin Coder, MD;  Location: [facility];  Service: Thoracic;  Laterality: N/A;  . VIDEO BRONCHOSCOPY WITH ENDOBRONCHIAL ULTRASOUND N/A 10/29/2018   Procedure: VIDEO BRONCHOSCOPY WITH ENDOBRONCHIAL ULTRASOUND;  Surgeon: Laurin Coder, MD;  Location: [facility];  Service: Thoracic;  Laterality: N/A;    REVIEW OF SYSTEMS:  A comprehensive review of systems was negative except for: Respiratory: positive for cough and dyspnea on exertion   PHYSICAL EXAMINATION: General appearance: alert, cooperative and no distress Head: Normocephalic, without obvious abnormality, atraumatic Neck: no adenopathy, no JVD, supple, symmetrical, trachea midline and thyroid not enlarged, symmetric, no tenderness/mass/nodules Lymph nodes: Cervical, supraclavicular, and axillary nodes normal. Resp: clear to auscultation bilaterally Back: symmetric, no curvature. ROM normal. No CVA tenderness. Cardio: regular rate and rhythm, S1, S2 normal, no murmur, click, rub or gallop GI: soft, non-tender; bowel sounds normal; no masses,  no organomegaly Extremities: extremities normal, atraumatic, no cyanosis or edema  ECOG PERFORMANCE STATUS: 1 - Symptomatic but completely ambulatory  Blood pressure 129/75, pulse 100, temperature 99.4 F (37.4 C), temperature source Oral, resp. rate 18, height 6\' 1"  (1.854 m), weight (!) 320 lb 4.8 oz (145.3 kg), SpO2 94 %.  LABORATORY DATA: Lab Results  Component Value Date   WBC 7.6 03/05/2019   HGB 12.5 (L) 03/05/2019   HCT 38.6 (L) 03/05/2019   MCV 94.6 03/05/2019   PLT 284 03/05/2019      Chemistry      Component Value Date/Time   NA 140 02/19/2019 1318   K 4.1 02/19/2019 1318   CL 104 02/19/2019 1318   CO2 25 02/19/2019 1318   BUN 15 02/19/2019 1318   CREATININE 0.99 02/19/2019 1318       Component Value Date/Time   CALCIUM 8.9 02/19/2019 1318   ALKPHOS 94 02/19/2019 1318   AST 22 02/19/2019 1318   ALT 20 02/19/2019 1318   BILITOT 0.6 02/19/2019 1318       RADIOGRAPHIC STUDIES: No results found.  ASSESSMENT AND PLAN: This is a very pleasant 63 years old white male with a stage IIIa non-small cell lung cancer, squamous cell carcinoma diagnosed in December 2019. The patient completed a course of treatment with concurrent chemoradiation with weekly carboplatin and paclitaxel status post 6 cycles.  He had partial response to this treatment. He was a started on consolidation treatment with Imfinzi status post 2 cycles and has been tolerating this treatment well.  He continues to have shortness of breath and cough likely secondary to his previous radiotherapy. I recommended for the patient to proceed with cycle #3 today as scheduled. I will see him back for follow-up visit in 2 weeks for evaluation before the  next cycle of his treatment. If he continues to have worsening dyspnea, will consider him for repeat imaging studies sooner. The patient was advised to call immediately if he has any other concerning symptoms in the interval. The patient voices understanding of current disease status and treatment options and is in agreement with the current care plan. All questions were answered. The patient knows to call the clinic with any problems, questions or concerns. We can certainly see the patient much sooner if necessary.  I spent 10 minutes counseling the patient face to face. The total time spent in the appointment was 15 minutes.  Disclaimer: This note was dictated with voice recognition software. Similar sounding words can inadvertently be transcribed and may not be corrected upon review.

## 2019-03-05 NOTE — Patient Instructions (Addendum)
[facility] Discharge Instructions for Patients Receiving Chemotherapy  Today you received the following chemotherapy agent:  Imfinzi (durvalumab)  To help prevent nausea and vomiting after your treatment, we encourage you to take your nausea medication as prescribed.   If you develop nausea and vomiting that is not controlled by your nausea medication, call the clinic.   BELOW ARE SYMPTOMS THAT SHOULD BE REPORTED IMMEDIATELY:  *FEVER GREATER THAN 100.5 F  *CHILLS WITH OR WITHOUT FEVER  NAUSEA AND VOMITING THAT IS NOT CONTROLLED WITH YOUR NAUSEA MEDICATION  *UNUSUAL SHORTNESS OF BREATH  *UNUSUAL BRUISING OR BLEEDING  TENDERNESS IN MOUTH AND THROAT WITH OR WITHOUT PRESENCE OF ULCERS  *URINARY PROBLEMS  *BOWEL PROBLEMS  UNUSUAL RASH Items with * indicate a potential emergency and should be followed up as soon as possible.  Feel free to call the clinic should you have any questions or concerns. The clinic phone number is [phone].  Please show the [facility] at check-in to the Emergency Department and triage nurse.   Coronavirus (COVID-19) Are you at risk?  Are you at risk for the Coronavirus (COVID-19)?  To be considered HIGH RISK for Coronavirus (COVID-19), you have to meet the following criteria:  . Traveled to [place], [place], [place], [place] or [place]; or in the [place] to [place], [place], [place], or [place]; and have fever, cough, and shortness of breath within the last 2 weeks of travel OR . Been in close contact with a person diagnosed with COVID-19 within the last 2 weeks and have fever, cough, and shortness of breath . IF YOU DO NOT MEET THESE CRITERIA, YOU ARE CONSIDERED LOW RISK FOR COVID-19.  What to do if you are HIGH RISK for COVID-19?  Marland Kitchen If you are having a medical emergency, call 911. . Seek medical care right away. Before you go to a doctor's office, urgent care or emergency department, call ahead and tell them  about your recent travel, contact with someone diagnosed with COVID-19, and your symptoms. You should receive instructions from your physician's office regarding next steps of care.  . When you arrive at healthcare provider, tell the healthcare staff immediately you have returned from visiting [place], [place], [place], [place] or [place]; or traveled in the [place] to [place], [place], [place], or [place]; in the last two weeks or you have been in close contact with a person diagnosed with COVID-19 in the last 2 weeks.   . Tell the health care staff about your symptoms: fever, cough and shortness of breath. . After you have been seen by a medical provider, you will be either: o Tested for (COVID-19) and discharged home on quarantine except to seek medical care if symptoms worsen, and asked to  - Stay home and avoid contact with others until you get your results (4-5 days)  - Avoid travel on public transportation if possible (such as bus, train, or airplane) or o Sent to the Emergency Department by EMS for evaluation, COVID-19 testing, and possible admission depending on your condition and test results.  What to do if you are LOW RISK for COVID-19?  Reduce your risk of any infection by using the same precautions used for avoiding the common cold or flu:  Marland Kitchen Wash your hands often with soap and warm water for at least 20 seconds.  If soap and water are not readily available, use an alcohol-based hand sanitizer with at least 60% alcohol.  . If  coughing or sneezing, cover your mouth and nose by coughing or sneezing into the elbow areas of your shirt or coat, into a tissue or into your sleeve (not your hands). . Avoid shaking hands with others and consider head nods or verbal greetings only. . Avoid touching your eyes, nose, or mouth with unwashed hands.  . Avoid close contact with people who are sick. . Avoid places or events with large numbers of people in one location, like concerts or  sporting events. . Carefully consider travel plans you have or are making. . If you are planning any travel outside or inside the [place], visit the CDC's Travelers' Health webpage for the latest health notices. . If you have some symptoms but not all symptoms, continue to monitor at home and seek medical attention if your symptoms worsen. . If you are having a medical emergency, call 911.   [facility] / e-Visit: [email or web address]         [facility]: [facility]: [phone]                   [facility]: [phone]

## 2019-03-06 ENCOUNTER — Telehealth: Payer: Self-pay | Admitting: Internal Medicine

## 2019-03-06 NOTE — Telephone Encounter (Signed)
Called regarding schedule °

## 2019-03-16 ENCOUNTER — Other Ambulatory Visit: Payer: Self-pay

## 2019-03-17 ENCOUNTER — Encounter: Payer: Self-pay | Admitting: Thoracic Surgery (Cardiothoracic Vascular Surgery)

## 2019-03-17 ENCOUNTER — Institutional Professional Consult (permissible substitution): Payer: Medicare Other | Admitting: Thoracic Surgery (Cardiothoracic Vascular Surgery)

## 2019-03-17 VITALS — BP 114/68 | HR 124 | Temp 98.1°F | Resp 18 | Ht 73.0 in | Wt 320.0 lb

## 2019-03-17 DIAGNOSIS — I712 Thoracic aortic aneurysm, without rupture, unspecified: Secondary | ICD-10-CM

## 2019-03-17 DIAGNOSIS — C3491 Malignant neoplasm of unspecified part of right bronchus or lung: Secondary | ICD-10-CM

## 2019-03-17 NOTE — Progress Notes (Signed)
PCP is [name], MD Referring Provider is Curt Bears, MD    HPI: Mr. Colgan is sent for consultation regarding a thoracic aortic aneurysm.  Alexander Hatfield is a 63 year old gentleman with a history of stage IIIa lung cancer, hypertension, obstructive sleep apnea, prediabetes, and chronic back pain.  He has been disabled at work since age 71 due to back problems.  He underwent cervical discectomy in December 2019.  On his preoperative chest x-ray he was noted to have a lung mass.  He was ultimately diagnosed with stage IIIa squamous cell carcinoma.  He was treated with concurrent chemo radiation using carboplatin and paclitaxel and 60 Gy of radiation.  He now is on consolidation immunotherapy with Imfinzi.  He had a follow-up CT which showed some partial response to therapy.  Also noted was a 4.6 cm ascending aneurysm.  He gets short of breath with even minimal exertion.  This is been going on for over a month.  He is not having any chest pain, pressure, or tightness.   Past Medical History:  Diagnosis Date  . Arthritis   . Chronic back pain   . Chronic back pain   . Headache   . HOH (hard of hearing)   . Hypertension   . Lung mass   . Pre-diabetes   . Sleep apnea    wears cpap  . Wears glasses   . Wears partial dentures     Past Surgical History:  Procedure Laterality Date  . ANTERIOR CERVICAL DECOMP/DISCECTOMY FUSION N/A 10/27/2018   Procedure: ANTERIOR CERVICAL THREE-FOUR, FOUR-FIVE DECOMPRESSION/DISCECTOMY FUSION TWO LEVELS;  Surgeon: Kary Kos, MD;  Location: [facility];  Service: Neurosurgery;  Laterality: N/A;   ANTERIOR CERVICAL THREE-FOUR, FOUR-FIVE DECOMPRESSION/DISCECTOMY FUSION TWO LEVELS  . BACK SURGERY    . IR IMAGING GUIDED PORT INSERTION  11/27/2018  . MULTIPLE TOOTH EXTRACTIONS    . SPINAL CORD STIMULATOR IMPLANT    . VIDEO BRONCHOSCOPY N/A 10/29/2018   Procedure: VIDEO BRONCHOSCOPY;  Surgeon: Laurin Coder, MD;  [facility];  Service: Thoracic;   Laterality: N/A;  . VIDEO BRONCHOSCOPY WITH ENDOBRONCHIAL ULTRASOUND N/A 10/27/2018   Procedure: VIDEO BRONCHOSCOPY WITH ENDOBRONCHIAL ULTRASOUND;  Surgeon: Laurin Coder, MD;  [facility];  Service: Thoracic;  Laterality: N/A;  . VIDEO BRONCHOSCOPY WITH ENDOBRONCHIAL ULTRASOUND N/A 10/29/2018   Procedure: VIDEO BRONCHOSCOPY WITH ENDOBRONCHIAL ULTRASOUND;  Surgeon: Laurin Coder, MD;  [facility];  Service: Thoracic;  Laterality: N/A;    Family History  Problem Relation Age of Onset  . Lupus Mother   . Lung disease Father     Social History Social History   Tobacco Use  . Smoking status: Former Smoker    Types: Cigarettes  . Smokeless tobacco: Never Used  . Tobacco comment: Quit smoking cigarettes 15 years ago  Substance Use Topics  . Alcohol use: No  . Drug use: No    Current Outpatient Medications  Medication Sig Dispense Refill  . amLODipine (NORVASC) 10 MG tablet Take 10 mg by mouth daily.     . cyclobenzaprine (FLEXERIL) 10 MG tablet Take 1 tablet (10 mg total) by mouth 3 (three) times daily as needed for muscle spasms. 30 tablet 0  . furosemide (LASIX) 20 MG tablet Take 20 mg by mouth daily.     Marland Kitchen gabapentin (NEURONTIN) 300 MG capsule Take 600 mg by mouth 3 (three) times daily.     Marland Kitchen lidocaine-prilocaine (EMLA) cream Apply 1 application topically as needed. 30 g 0  .  losartan (COZAAR) 100 MG tablet Take 100 mg by mouth daily.     . magic mouthwash w/lidocaine SOLN Take by mouth.    . meloxicam (MOBIC) 15 MG tablet Take 15 mg by mouth daily.     Marland Kitchen oxycodone (ROXICODONE) 30 MG immediate release tablet One to two tablets every 8 hours as needed for pain.  Max 5 tab/day.  Must last 30 days.    . potassium chloride SA (K-DUR,KLOR-CON) 20 MEQ tablet Take 20 mEq by mouth every evening.     . prochlorperazine (COMPAZINE) 10 MG tablet Take 1 tablet (10 mg total) by mouth every 6 (six) hours as needed for nausea or vomiting. 30 tablet 0   No current  facility-administered medications for this visit.     Allergies  Allergen Reactions  . Iohexol Other (See Comments)     Desc: PT STATES THAT WHEN GIVEN THE CONTRAST HIS LEGS BURN AND HE PASSES OUT, COMA FOR 7DAY. DR Thornton Papas WOULD NOT PRE MEDICATE DUE TO SEVERITY OF REACTION, STUDY WAS CX     Review of Systems  Constitutional: Positive for fatigue.  HENT: Negative for trouble swallowing and voice change.   Respiratory: Positive for shortness of breath. Negative for chest tightness.   Cardiovascular: Positive for leg swelling. Negative for chest pain.  Gastrointestinal: Negative for abdominal distention and abdominal pain.  Genitourinary: Negative for difficulty urinating and dysuria.  Musculoskeletal: Positive for arthralgias, gait problem and joint swelling.  Neurological: Positive for numbness. Negative for weakness.  Hematological: Negative for adenopathy. Does not bruise/bleed easily.  All other systems reviewed and are negative.   BP 114/68 (BP Location: Left Arm, Patient Position: Sitting, Cuff Size: Large)   Pulse (!) 124   Temp 98.1 F (36.7 C) (Skin)   Resp 18   Ht 6\' 1"  (1.854 m)   Wt (!) 320 lb (145.2 kg)   SpO2 95% Comment: ON RA  BMI 42.22 kg/m  Physical Exam Vitals signs reviewed.  Constitutional:      General: He is not in acute distress.    Appearance: He is obese.  HENT:     Head: Normocephalic and atraumatic.  Eyes:     General: No scleral icterus.    Extraocular Movements: Extraocular movements intact.     Conjunctiva/sclera: Conjunctivae normal.  Neck:     Musculoskeletal: Neck supple.     Vascular: No carotid bruit.  Cardiovascular:     Rate and Rhythm: Regular rhythm. Tachycardia present.     Pulses: Normal pulses.     Heart sounds: No murmur. No friction rub. No gallop.   Pulmonary:     Effort: Pulmonary effort is normal. No respiratory distress.     Breath sounds: Wheezing (Faint on right) present.     Comments: Short of breath with minimal  activity Abdominal:     General: There is no distension.     Palpations: Abdomen is soft.     Tenderness: There is no abdominal tenderness.  Lymphadenopathy:     Cervical: No cervical adenopathy.  Skin:    General: Skin is warm and dry.  Neurological:     General: No focal deficit present.     Mental Status: He is alert.     Cranial Nerves: No cranial nerve deficit.     Motor: No weakness.    Diagnostic Tests: CT CHEST WITHOUT CONTRAST  TECHNIQUE: Multidetector CT imaging of the chest was performed following the standard protocol without IV contrast.  COMPARISON:  10/17/2018 chest CT.  10/24/2018 PET-CT.  FINDINGS: Cardiovascular: Normal heart size. No significant pericardial effusion/thickening. Three-vessel coronary atherosclerosis. Right internal jugular Port-A-Cath terminates at the cavoatrial junction. Atherosclerotic thoracic aorta with stable 4.6 cm ascending thoracic aortic aneurysm. Top-normal main pulmonary artery (3.3 cm diameter), stable.  Mediastinum/Nodes: Stable 3.7 cm heterogeneous right thyroid nodule. New mild circumferential wall thickening throughout the mid to upper thoracic esophagus (series 2/image 41). No axillary adenopathy. Enlarged 2.2 cm right paratracheal node (series 2/image 56), previously 2.4 cm, minimally decreased. No new pathologically enlarged mediastinal nodes. No discrete hilar adenopathy on this noncontrast scan.  Lungs/Pleura: No pneumothorax. No pleural effusion. Spiculated posterior right upper lobe 3.5 x 3.0 cm lung mass (series 5/image 62), decreased from 4.0 x 3.5 cm on 10/17/2018 chest CT. New vague clustered subsolid pulmonary nodules in the posterior right lower lobe, largest 1.3 cm (series 5/image 103). Scattered subcentimeter calcified granulomas in both lungs. No additional new significant pulmonary nodules. Mild centrilobular emphysema with mild diffuse bronchial wall thickening.  Upper abdomen: Stable thick  calcifications at the periphery of the spleen compatible with remote insult.  Musculoskeletal: No aggressive appearing focal osseous lesions. Symmetric mild to moderate bilateral gynecomastia, unchanged. Inferior approach spinal stimulator lead is seen terminating in the posterior lower thoracic spinal canal. Moderate thoracic spondylosis. Partially visualized spinal fusion hardware in the lumbar spine and overlying the cervical spine on the scout topogram.  IMPRESSION: 1. Spiculated posterior right upper lobe lung mass is mildly decreased in size since 10/17/2018 chest CT. 2. New vague clustered subsolid pulmonary nodules in the posterior right lower lobe, nonspecific, potentially early radiation change. Recurrent attention on close chest CT follow-up. 3. Right paratracheal lymphadenopathy is minimally decreased. 4. New mild circumferential wall thickening throughout the mid to upper thoracic esophagus, nonspecific, potentially treatment related. 5. Stable 4.6 cm ascending thoracic aortic aneurysm. Ascending thoracic aortic aneurysm. Recommend semi-annual imaging followup by CTA or MRA and referral to cardiothoracic surgery if not already obtained. This recommendation follows 2010 ACCF/AHA/AATS/ACR/ASA/SCA/SCAI/SIR/STS/SVM Guidelines for the Diagnosis and Management of Patients With Thoracic Aortic Disease. Circulation. 2010; 121: P233-A076. Aortic aneurysm NOS (ICD10-I71.9).  Aortic Atherosclerosis (ICD10-I70.0) and Emphysema (ICD10-J43.9).   Electronically Signed   By: Ilona Sorrel M.D.   On: 01/28/2019 09:53 I personally reviewed the CT chest images and concur with the findings noted above  Impression: Alexander Hatfield is a 63 year old man with a past medical history significant for stage IIIa squamous cell carcinoma of the lung, morbid obesity, hypertension, prediabetes, obstructive sleep apnea, chronic back pain and neuropathy secondary to cervical disc disease.  He was  diagnosed with stage IIIa lung cancer in December 2019.  He was treated with chemoradiation and now is on immunotherapy.  He had a partial response to treatment noted on his most recent CT.  He was also found to have a 4.6 cm ascending aneurysm.  There is no indication for surgery but he will need continued follow-up.  His blood pressure is well controlled at the current time.  Follow-up as indicated at 22-month intervals.  I suspect he will be getting CTs more frequently with that through Dr. Worthy Flank office.  So rather than try to order scans separately I will defer to Dr. Julien Nordmann.  I will see Mr. Bossard back in 6 months and will just review his most recent CT.  If it has been over 6 months since his most recent CT we will schedule a new one.  Thoracic aortic atherosclerosis noted on CT  Morbid obesity-attempts at weight loss limited secondary  to shortness of breath and deconditioning  Plan: Return in 6 months.  We will review his most recent interval CT at that time  Melrose Nakayama, MD [facility] [phone]

## 2019-03-19 ENCOUNTER — Inpatient Hospital Stay: Payer: Medicare Other | Attending: Internal Medicine

## 2019-03-19 ENCOUNTER — Other Ambulatory Visit: Payer: Self-pay

## 2019-03-19 ENCOUNTER — Inpatient Hospital Stay: Payer: Medicare Other | Admitting: Internal Medicine

## 2019-03-19 ENCOUNTER — Inpatient Hospital Stay: Payer: Medicare Other

## 2019-03-19 ENCOUNTER — Encounter: Payer: Self-pay | Admitting: Internal Medicine

## 2019-03-19 VITALS — HR 86

## 2019-03-19 VITALS — BP 128/72 | HR 101 | Temp 98.4°F | Resp 18 | Ht 73.0 in | Wt 317.2 lb

## 2019-03-19 DIAGNOSIS — Z95828 Presence of other vascular implants and grafts: Secondary | ICD-10-CM

## 2019-03-19 DIAGNOSIS — R0602 Shortness of breath: Secondary | ICD-10-CM | POA: Diagnosis not present

## 2019-03-19 DIAGNOSIS — Z79899 Other long term (current) drug therapy: Secondary | ICD-10-CM | POA: Diagnosis not present

## 2019-03-19 DIAGNOSIS — C3411 Malignant neoplasm of upper lobe, right bronchus or lung: Secondary | ICD-10-CM | POA: Insufficient documentation

## 2019-03-19 DIAGNOSIS — I10 Essential (primary) hypertension: Secondary | ICD-10-CM

## 2019-03-19 DIAGNOSIS — Z5112 Encounter for antineoplastic immunotherapy: Secondary | ICD-10-CM

## 2019-03-19 DIAGNOSIS — C3491 Malignant neoplasm of unspecified part of right bronchus or lung: Secondary | ICD-10-CM

## 2019-03-19 LAB — CMP (CANCER CENTER ONLY)
ALT: 14 U/L (ref 0–44)
AST: 25 U/L (ref 15–41)
Albumin: 3.3 g/dL — ABNORMAL LOW (ref 3.5–5.0)
Alkaline Phosphatase: 120 U/L (ref 38–126)
Anion gap: 9 (ref 5–15)
BUN: 12 mg/dL (ref 8–23)
CO2: 26 mmol/L (ref 22–32)
Calcium: 9.3 mg/dL (ref 8.9–10.3)
Chloride: 107 mmol/L (ref 98–111)
Creatinine: 0.88 mg/dL (ref 0.61–1.24)
GFR, Est AFR Am: >60 mL/min (ref >60)
GFR, Estimated: >60 mL/min (ref >60)
Glucose, Bld: 98 mg/dL (ref 70–99)
Potassium: 4.4 mmol/L (ref 3.5–5.1)
Sodium: 142 mmol/L (ref 135–145)
Total Bilirubin: 0.3 mg/dL (ref 0.3–1.2)
Total Protein: 7.7 g/dL (ref 6.5–8.1)

## 2019-03-19 LAB — CBC WITH DIFFERENTIAL (CANCER CENTER ONLY)
Abs Immature Granulocytes: 0.03 10*3/uL (ref 0.00–0.07)
Basophils Absolute: 0 10*3/uL (ref 0.0–0.1)
Basophils Relative: 1 %
Eosinophils Absolute: 0.3 10*3/uL (ref 0.0–0.5)
Eosinophils Relative: 5 %
HCT: 38.3 % — ABNORMAL LOW (ref 39.0–52.0)
Hemoglobin: 12.2 g/dL — ABNORMAL LOW (ref 13.0–17.0)
Immature Granulocytes: 0 %
Lymphocytes Relative: 17 %
Lymphs Abs: 1.2 10*3/uL (ref 0.7–4.0)
MCH: 30.2 pg (ref 26.0–34.0)
MCHC: 31.9 g/dL (ref 30.0–36.0)
MCV: 94.8 fL (ref 80.0–100.0)
Monocytes Absolute: 0.8 10*3/uL (ref 0.1–1.0)
Monocytes Relative: 11 %
Neutro Abs: 4.7 10*3/uL (ref 1.7–7.7)
Neutrophils Relative %: 66 %
Platelet Count: 316 10*3/uL (ref 150–400)
RBC: 4.04 MIL/uL — ABNORMAL LOW (ref 4.22–5.81)
RDW: 13.7 % (ref 11.5–15.5)
WBC Count: 7 10*3/uL (ref 4.0–10.5)
nRBC: 0 % (ref 0.0–0.2)

## 2019-03-19 MED ORDER — HEPARIN SOD (PORK) LOCK FLUSH 100 UNIT/ML IV SOLN
500.0000 [IU] | Freq: Once | INTRAVENOUS | Status: AC | PRN
  Administered 2019-03-19: 500 [IU]
  Filled 2019-03-19: qty 5

## 2019-03-19 MED ORDER — SODIUM CHLORIDE 0.9 % IV SOLN
10.6000 mg/kg | Freq: Once | INTRAVENOUS | Status: AC
  Administered 2019-03-19: 12:00:00 1500 mg via INTRAVENOUS
  Filled 2019-03-19: qty 30

## 2019-03-19 MED ORDER — SODIUM CHLORIDE 0.9% FLUSH
10.0000 mL | Freq: Once | INTRAVENOUS | Status: AC
  Administered 2019-03-19: 10 mL
  Filled 2019-03-19: qty 10

## 2019-03-19 MED ORDER — SODIUM CHLORIDE 0.9 % IV SOLN
Freq: Once | INTRAVENOUS | Status: AC
  Administered 2019-03-19: 11:00:00 via INTRAVENOUS
  Filled 2019-03-19: qty 250

## 2019-03-19 MED ORDER — SODIUM CHLORIDE 0.9% FLUSH
10.0000 mL | INTRAVENOUS | Status: DC | PRN
  Administered 2019-03-19: 10 mL
  Filled 2019-03-19: qty 10

## 2019-03-19 NOTE — Progress Notes (Signed)
[facility] [phone]   Fax:([phone]  OFFICE PROGRESS NOTE  [name], MD [place] [address]  DIAGNOSIS: stage IIIa (T2b, N2, M0) non-small cell lung cancer, squamous cell carcinoma presented with right upper lobe lung mass in addition to right paratracheal lymphadenopathy diagnosed in December 2019. The patient also has a hypermetabolic lesion in the left parotid gland that need further evaluation.  PRIOR THERAPY:  concurrent chemoradiation with weekly carboplatin for AUC of 2 and paclitaxel 45 mg/M2.  First dose started on 12/02/2018. Status post 6 cycles.  Last cycle was given on January 05, 2019 the patient is receiving radiation closer to home in [place], [place] completed January 12, 2019.   CURRENT THERAPY:  Consolidation treatment with immunotherapy with Imfinzi 10 mg/KG IV every 2 weeks.  First dose was given February 05, 2019. Status post 3 cycles.  INTERVAL HISTORY: Alexander Hatfield. 63 y.o. male returns to the clinic today for follow-up visit.  The patient is feeling well with no concerning complaints except for the baseline shortness of breath.  He denied having any chest pain, cough or hemoptysis.  He intentionally lost 3 pounds since his last visit.  He denied having any fever or chills.  He has no headache or visual changes.  The patient has no nausea, vomiting, diarrhea or constipation.  He is here today for evaluation before starting cycle #4.   MEDICAL HISTORY: Past Medical History:  Diagnosis Date  . Arthritis   . Chronic back pain   . Chronic back pain   . Headache   . HOH (hard of hearing)   . Hypertension   . Lung mass   . Pre-diabetes   . Sleep apnea    wears cpap  . Wears glasses   . Wears partial dentures     ALLERGIES:  is allergic to iohexol.  MEDICATIONS:  Current Outpatient Medications  Medication Sig Dispense Refill  . amLODipine (NORVASC) 10 MG tablet Take 10 mg by mouth  daily.     . cyclobenzaprine (FLEXERIL) 10 MG tablet Take 1 tablet (10 mg total) by mouth 3 (three) times daily as needed for muscle spasms. 30 tablet 0  . furosemide (LASIX) 20 MG tablet Take 20 mg by mouth daily.     Marland Kitchen gabapentin (NEURONTIN) 300 MG capsule Take 600 mg by mouth 3 (three) times daily.     Marland Kitchen lidocaine-prilocaine (EMLA) cream Apply 1 application topically as needed. 30 g 0  . losartan (COZAAR) 100 MG tablet Take 100 mg by mouth daily.     . magic mouthwash w/lidocaine SOLN Take by mouth.    . meloxicam (MOBIC) 15 MG tablet Take 15 mg by mouth daily.     Marland Kitchen oxycodone (ROXICODONE) 30 MG immediate release tablet One to two tablets every 8 hours as needed for pain.  Max 5 tab/day.  Must last 30 days.    . potassium chloride SA (K-DUR,KLOR-CON) 20 MEQ tablet Take 20 mEq by mouth every evening.     . prochlorperazine (COMPAZINE) 10 MG tablet Take 1 tablet (10 mg total) by mouth every 6 (six) hours as needed for nausea or vomiting. 30 tablet 0   No current facility-administered medications for this visit.     SURGICAL HISTORY:  Past Surgical History:  Procedure Laterality Date  . ANTERIOR CERVICAL DECOMP/DISCECTOMY FUSION N/A 10/27/2018   Procedure: ANTERIOR CERVICAL THREE-FOUR, FOUR-FIVE DECOMPRESSION/DISCECTOMY FUSION TWO LEVELS;  Surgeon: Kary Kos, MD;  Location: [facility];  Service: Neurosurgery;  Laterality: N/A;   ANTERIOR CERVICAL THREE-FOUR, FOUR-FIVE DECOMPRESSION/DISCECTOMY FUSION TWO LEVELS  . BACK SURGERY    . IR IMAGING GUIDED PORT INSERTION  11/27/2018  . MULTIPLE TOOTH EXTRACTIONS    . SPINAL CORD STIMULATOR IMPLANT    . VIDEO BRONCHOSCOPY N/A 10/29/2018   Procedure: VIDEO BRONCHOSCOPY;  Surgeon: Laurin Coder, MD;  Location: [facility];  Service: Thoracic;  Laterality: N/A;  . VIDEO BRONCHOSCOPY WITH ENDOBRONCHIAL ULTRASOUND N/A 10/27/2018   Procedure: VIDEO BRONCHOSCOPY WITH ENDOBRONCHIAL ULTRASOUND;  Surgeon: Laurin Coder, MD;  Location: [facility];  Service:  Thoracic;  Laterality: N/A;  . VIDEO BRONCHOSCOPY WITH ENDOBRONCHIAL ULTRASOUND N/A 10/29/2018   Procedure: VIDEO BRONCHOSCOPY WITH ENDOBRONCHIAL ULTRASOUND;  Surgeon: Laurin Coder, MD;  [facility];  Service: Thoracic;  Laterality: N/A;    REVIEW OF SYSTEMS:  A comprehensive review of systems was negative except for: Respiratory: positive for dyspnea on exertion   PHYSICAL EXAMINATION: General appearance: alert, cooperative and no distress Head: Normocephalic, without obvious abnormality, atraumatic Neck: no adenopathy, no JVD, supple, symmetrical, trachea midline and thyroid not enlarged, symmetric, no tenderness/mass/nodules Lymph nodes: Cervical, supraclavicular, and axillary nodes normal. Resp: clear to auscultation bilaterally Back: symmetric, no curvature. ROM normal. No CVA tenderness. Cardio: regular rate and rhythm, S1, S2 normal, no murmur, click, rub or gallop GI: soft, non-tender; bowel sounds normal; no masses,  no organomegaly Extremities: extremities normal, atraumatic, no cyanosis or edema  ECOG PERFORMANCE STATUS: 1 - Symptomatic but completely ambulatory  Blood pressure 128/72, pulse (!) 101, temperature 98.4 F (36.9 C), temperature source Oral, resp. rate 18, height 6\' 1"  (1.854 m), weight (!) 317 lb 3.2 oz (143.9 kg), SpO2 95 %.  LABORATORY DATA: Lab Results  Component Value Date   WBC 7.0 03/19/2019   HGB 12.2 (L) 03/19/2019   HCT 38.3 (L) 03/19/2019   MCV 94.8 03/19/2019   PLT 316 03/19/2019      Chemistry      Component Value Date/Time   NA 139 03/05/2019 1121   K 4.3 03/05/2019 1121   CL 104 03/05/2019 1121   CO2 25 03/05/2019 1121   BUN 13 03/05/2019 1121   CREATININE 0.94 03/05/2019 1121      Component Value Date/Time   CALCIUM 9.2 03/05/2019 1121   ALKPHOS 116 03/05/2019 1121   AST 20 03/05/2019 1121   ALT 12 03/05/2019 1121   BILITOT 0.4 03/05/2019 1121       RADIOGRAPHIC STUDIES: No results found.  ASSESSMENT AND PLAN:  This is a very pleasant 63 years old white male with a stage IIIa non-small cell lung cancer, squamous cell carcinoma diagnosed in December 2019. The patient completed a course of treatment with concurrent chemoradiation with weekly carboplatin and paclitaxel status post 6 cycles.  He had partial response to this treatment. He was a started on consolidation treatment with Imfinzi status post 3 cycles. The patient continues to tolerate this treatment well with no concerning complaints. I recommended for him to proceed with cycle 4 today as scheduled. I will see him back for follow-up visit in 2 weeks for evaluation before the next cycle of his treatment. He was advised to call immediately if he has any concerning symptoms in the interval. The patient voices understanding of current disease status and treatment options and is in agreement with the current care plan. All questions were answered. The patient knows to call the clinic with any problems, questions or concerns. We can  certainly see the patient much sooner if necessary.  I spent 10 minutes counseling the patient face to face. The total time spent in the appointment was 15 minutes.  Disclaimer: This note was dictated with voice recognition software. Similar sounding words can inadvertently be transcribed and may not be corrected upon review.

## 2019-03-19 NOTE — Patient Instructions (Addendum)
[facility] Discharge Instructions for Patients Receiving Chemotherapy  Today you received the following chemotherapy agents:  durvalumab (Imfinzi)  To help prevent nausea and vomiting after your treatment, we encourage you to take your nausea medication as prescribed.   If you develop nausea and vomiting that is not controlled by your nausea medication, call the clinic.   BELOW ARE SYMPTOMS THAT SHOULD BE REPORTED IMMEDIATELY:  *FEVER GREATER THAN 100.5 F  *CHILLS WITH OR WITHOUT FEVER  NAUSEA AND VOMITING THAT IS NOT CONTROLLED WITH YOUR NAUSEA MEDICATION  *UNUSUAL SHORTNESS OF BREATH  *UNUSUAL BRUISING OR BLEEDING  TENDERNESS IN MOUTH AND THROAT WITH OR WITHOUT PRESENCE OF ULCERS  *URINARY PROBLEMS  *BOWEL PROBLEMS  UNUSUAL RASH Items with * indicate a potential emergency and should be followed up as soon as possible.  Feel free to call the clinic should you have any questions or concerns. The clinic phone number is [phone].  Please show the [facility] at check-in to the Emergency Department and triage nurse.   Coronavirus (COVID-19) Are you at risk?  Are you at risk for the Coronavirus (COVID-19)?  To be considered HIGH RISK for Coronavirus (COVID-19), you have to meet the following criteria:  . Traveled to [place], [place], [place], [place] or [place]; or in the [place] to [place], [place], [place], or [place]; and have fever, cough, and shortness of breath within the last 2 weeks of travel OR . Been in close contact with a person diagnosed with COVID-19 within the last 2 weeks and have fever, cough, and shortness of breath . IF YOU DO NOT MEET THESE CRITERIA, YOU ARE CONSIDERED LOW RISK FOR COVID-19.  What to do if you are HIGH RISK for COVID-19?  Marland Kitchen If you are having a medical emergency, call 911. . Seek medical care right away. Before you go to a doctor's office, urgent care or emergency department, call ahead and tell them  about your recent travel, contact with someone diagnosed with COVID-19, and your symptoms. You should receive instructions from your physician's office regarding next steps of care.  . When you arrive at healthcare provider, tell the healthcare staff immediately you have returned from visiting [place], [place], [place], [place] or [place]; or traveled in the [place] to [place], [place], [place], or [place]; in the last two weeks or you have been in close contact with a person diagnosed with COVID-19 in the last 2 weeks.   . Tell the health care staff about your symptoms: fever, cough and shortness of breath. . After you have been seen by a medical provider, you will be either: o Tested for (COVID-19) and discharged home on quarantine except to seek medical care if symptoms worsen, and asked to  - Stay home and avoid contact with others until you get your results (4-5 days)  - Avoid travel on public transportation if possible (such as bus, train, or airplane) or o Sent to the Emergency Department by EMS for evaluation, COVID-19 testing, and possible admission depending on your condition and test results.  What to do if you are LOW RISK for COVID-19?  Reduce your risk of any infection by using the same precautions used for avoiding the common cold or flu:  Marland Kitchen Wash your hands often with soap and warm water for at least 20 seconds.  If soap and water are not readily available, use an alcohol-based hand sanitizer with at least 60% alcohol.  . If  coughing or sneezing, cover your mouth and nose by coughing or sneezing into the elbow areas of your shirt or coat, into a tissue or into your sleeve (not your hands). . Avoid shaking hands with others and consider head nods or verbal greetings only. . Avoid touching your eyes, nose, or mouth with unwashed hands.  . Avoid close contact with people who are sick. . Avoid places or events with large numbers of people in one location, like concerts or  sporting events. . Carefully consider travel plans you have or are making. . If you are planning any travel outside or inside the [place], visit the CDC's Travelers' Health webpage for the latest health notices. . If you have some symptoms but not all symptoms, continue to monitor at home and seek medical attention if your symptoms worsen. . If you are having a medical emergency, call 911.   [facility] [place] / e-Visit: [email or web address]         [facility]: [facility]: [phone]                   [facility]: [phone]

## 2019-03-20 ENCOUNTER — Telehealth: Payer: Self-pay | Admitting: *Deleted

## 2019-03-20 NOTE — Telephone Encounter (Signed)
Received call from patient's wife.  She states that she and her husband are planning on being out of town in August 2020 and she thinks their week away may fall on a treatment week.  She just wanted to know if he can miss a treatment at that point.  Encouraged wife to discuss with Dr. Julien Nordmann at next appt.  She voiced understanding

## 2019-04-02 ENCOUNTER — Encounter: Payer: Self-pay | Admitting: Physician Assistant

## 2019-04-02 ENCOUNTER — Other Ambulatory Visit: Payer: Self-pay

## 2019-04-02 ENCOUNTER — Inpatient Hospital Stay: Payer: Medicare Other | Admitting: Physician Assistant

## 2019-04-02 ENCOUNTER — Inpatient Hospital Stay: Payer: Medicare Other

## 2019-04-02 VITALS — HR 94

## 2019-04-02 VITALS — BP 121/75 | HR 111 | Temp 98.7°F | Resp 18 | Ht 73.0 in | Wt 317.0 lb

## 2019-04-02 DIAGNOSIS — C3491 Malignant neoplasm of unspecified part of right bronchus or lung: Secondary | ICD-10-CM

## 2019-04-02 DIAGNOSIS — Z79899 Other long term (current) drug therapy: Secondary | ICD-10-CM | POA: Diagnosis not present

## 2019-04-02 DIAGNOSIS — Z95828 Presence of other vascular implants and grafts: Secondary | ICD-10-CM

## 2019-04-02 DIAGNOSIS — R05 Cough: Secondary | ICD-10-CM

## 2019-04-02 DIAGNOSIS — R0602 Shortness of breath: Secondary | ICD-10-CM

## 2019-04-02 DIAGNOSIS — C3490 Malignant neoplasm of unspecified part of unspecified bronchus or lung: Secondary | ICD-10-CM

## 2019-04-02 DIAGNOSIS — C3411 Malignant neoplasm of upper lobe, right bronchus or lung: Secondary | ICD-10-CM

## 2019-04-02 DIAGNOSIS — Z5112 Encounter for antineoplastic immunotherapy: Secondary | ICD-10-CM | POA: Diagnosis not present

## 2019-04-02 LAB — CBC WITH DIFFERENTIAL (CANCER CENTER ONLY)
Abs Immature Granulocytes: 0.03 10*3/uL (ref 0.00–0.07)
Basophils Absolute: 0 10*3/uL (ref 0.0–0.1)
Basophils Relative: 0 %
Eosinophils Absolute: 0.3 10*3/uL (ref 0.0–0.5)
Eosinophils Relative: 4 %
HCT: 42 % (ref 39.0–52.0)
Hemoglobin: 13.2 g/dL (ref 13.0–17.0)
Immature Granulocytes: 0 %
Lymphocytes Relative: 18 %
Lymphs Abs: 1.4 10*3/uL (ref 0.7–4.0)
MCH: 29.3 pg (ref 26.0–34.0)
MCHC: 31.4 g/dL (ref 30.0–36.0)
MCV: 93.3 fL (ref 80.0–100.0)
Monocytes Absolute: 0.8 10*3/uL (ref 0.1–1.0)
Monocytes Relative: 10 %
Neutro Abs: 5.2 10*3/uL (ref 1.7–7.7)
Neutrophils Relative %: 68 %
Platelet Count: 307 10*3/uL (ref 150–400)
RBC: 4.5 MIL/uL (ref 4.22–5.81)
RDW: 13.2 % (ref 11.5–15.5)
WBC Count: 7.8 10*3/uL (ref 4.0–10.5)
nRBC: 0 % (ref 0.0–0.2)

## 2019-04-02 LAB — CMP (CANCER CENTER ONLY)
ALT: 30 U/L (ref 0–44)
AST: 24 U/L (ref 15–41)
Albumin: 3.5 g/dL (ref 3.5–5.0)
Alkaline Phosphatase: 131 U/L — ABNORMAL HIGH (ref 38–126)
Anion gap: 10 (ref 5–15)
BUN: 13 mg/dL (ref 8–23)
CO2: 26 mmol/L (ref 22–32)
Calcium: 9 mg/dL (ref 8.9–10.3)
Chloride: 104 mmol/L (ref 98–111)
Creatinine: 1.08 mg/dL (ref 0.61–1.24)
GFR, Est AFR Am: >60 mL/min (ref >60)
GFR, Estimated: >60 mL/min (ref >60)
Glucose, Bld: 103 mg/dL — ABNORMAL HIGH (ref 70–99)
Potassium: 4 mmol/L (ref 3.5–5.1)
Sodium: 140 mmol/L (ref 135–145)
Total Bilirubin: 0.3 mg/dL (ref 0.3–1.2)
Total Protein: 7.6 g/dL (ref 6.5–8.1)

## 2019-04-02 LAB — TSH: TSH: 0.999 u[IU]/mL (ref 0.320–4.118)

## 2019-04-02 MED ORDER — SODIUM CHLORIDE 0.9% FLUSH
10.0000 mL | INTRAVENOUS | Status: DC | PRN
  Administered 2019-04-02: 16:00:00 10 mL
  Filled 2019-04-02: qty 10

## 2019-04-02 MED ORDER — HEPARIN SOD (PORK) LOCK FLUSH 100 UNIT/ML IV SOLN
500.0000 [IU] | Freq: Once | INTRAVENOUS | Status: AC | PRN
  Administered 2019-04-02: 500 [IU]
  Filled 2019-04-02: qty 5

## 2019-04-02 MED ORDER — SODIUM CHLORIDE 0.9% FLUSH
10.0000 mL | Freq: Once | INTRAVENOUS | Status: AC
  Administered 2019-04-02: 13:00:00 10 mL
  Filled 2019-04-02: qty 10

## 2019-04-02 MED ORDER — SODIUM CHLORIDE 0.9 % IV SOLN
Freq: Once | INTRAVENOUS | Status: AC
  Administered 2019-04-02: 14:00:00 via INTRAVENOUS
  Filled 2019-04-02: qty 250

## 2019-04-02 MED ORDER — SODIUM CHLORIDE 0.9 % IV SOLN
10.6000 mg/kg | Freq: Once | INTRAVENOUS | Status: AC
  Administered 2019-04-02: 1500 mg via INTRAVENOUS
  Filled 2019-04-02: qty 30

## 2019-04-02 NOTE — Patient Instructions (Signed)
[facility] Discharge Instructions for Patients Receiving Chemotherapy  Today you received the following Immunotherapy agent today: Imfinzi  To help prevent nausea and vomiting after your treatment, we encourage you to take your nausea medication as directed by your MD.   If you develop nausea and vomiting that is not controlled by your nausea medication, call the clinic.   BELOW ARE SYMPTOMS THAT SHOULD BE REPORTED IMMEDIATELY:  *FEVER GREATER THAN 100.5 F  *CHILLS WITH OR WITHOUT FEVER  NAUSEA AND VOMITING THAT IS NOT CONTROLLED WITH YOUR NAUSEA MEDICATION  *UNUSUAL SHORTNESS OF BREATH  *UNUSUAL BRUISING OR BLEEDING  TENDERNESS IN MOUTH AND THROAT WITH OR WITHOUT PRESENCE OF ULCERS  *URINARY PROBLEMS  *BOWEL PROBLEMS  UNUSUAL RASH Items with * indicate a potential emergency and should be followed up as soon as possible.  Feel free to call the clinic should you have any questions or concerns. The clinic phone number is [phone].  Please show the [facility] at check-in to the Emergency Department and triage nurse.  Coronavirus (COVID-19) Are you at risk?  Are you at risk for the Coronavirus (COVID-19)?  To be considered HIGH RISK for Coronavirus (COVID-19), you have to meet the following criteria:  . Traveled to [place], [place], [place], [place] or [place]; or in the [place] to [place], [place], [place], or [place]; and have fever, cough, and shortness of breath within the last 2 weeks of travel OR . Been in close contact with a person diagnosed with COVID-19 within the last 2 weeks and have fever, cough, and shortness of breath . IF YOU DO NOT MEET THESE CRITERIA, YOU ARE CONSIDERED LOW RISK FOR COVID-19.  What to do if you are HIGH RISK for COVID-19?  Marland Kitchen If you are having a medical emergency, call 911. . Seek medical care right away. Before you go to a doctor's office, urgent care or emergency department, call ahead and tell them  about your recent travel, contact with someone diagnosed with COVID-19, and your symptoms. You should receive instructions from your physician's office regarding next steps of care.  . When you arrive at healthcare provider, tell the healthcare staff immediately you have returned from visiting [place], [place], [place], [place] or [place]; or traveled in the [place] to [place], [place], [place], or [place]; in the last two weeks or you have been in close contact with a person diagnosed with COVID-19 in the last 2 weeks.   . Tell the health care staff about your symptoms: fever, cough and shortness of breath. . After you have been seen by a medical provider, you will be either: o Tested for (COVID-19) and discharged home on quarantine except to seek medical care if symptoms worsen, and asked to  - Stay home and avoid contact with others until you get your results (4-5 days)  - Avoid travel on public transportation if possible (such as bus, train, or airplane) or o Sent to the Emergency Department by EMS for evaluation, COVID-19 testing, and possible admission depending on your condition and test results.  What to do if you are LOW RISK for COVID-19?  Reduce your risk of any infection by using the same precautions used for avoiding the common cold or flu:  Marland Kitchen Wash your hands often with soap and warm water for at least 20 seconds.  If soap and water are not readily available, use an alcohol-based hand sanitizer with at least 60% alcohol.  Marland Kitchen  If coughing or sneezing, cover your mouth and nose by coughing or sneezing into the elbow areas of your shirt or coat, into a tissue or into your sleeve (not your hands). . Avoid shaking hands with others and consider head nods or verbal greetings only. . Avoid touching your eyes, nose, or mouth with unwashed hands.  . Avoid close contact with people who are sick. . Avoid places or events with large numbers of people in one location, like concerts or  sporting events. . Carefully consider travel plans you have or are making. . If you are planning any travel outside or inside the [place], visit the CDC's Travelers' Health webpage for the latest health notices. . If you have some symptoms but not all symptoms, continue to monitor at home and seek medical attention if your symptoms worsen. . If you are having a medical emergency, call 911.   [facility] / e-Visit: [email or web address]         [facility]: [facility]: [phone]                   [facility]: [phone]

## 2019-04-02 NOTE — Progress Notes (Signed)
[facility] OFFICE PROGRESS NOTE  [name], MD [place] [address]  DIAGNOSIS: Stage IIIa (T2b, N2, M0) non-small cell lung cancer, squamous cell carcinoma presented with right upper lobe lung mass in addition to right paratracheal lymphadenopathy diagnosed in December 2019. The patient also has a hypermetabolic lesion in the left parotid gland that need further evaluation.  PRIOR THERAPY: concurrent chemoradiation with weekly carboplatin for AUC of 2 and paclitaxel 45 mg/M2.First dose started on 12/02/2018.Status post 6 cycles.  Last cycle was given on January 05, 2019 the patient is receiving radiation closer to home in [place], [place] completed January 12, 2019.  CURRENT THERAPY: Consolidation treatment with immunotherapy with Imfinzi 10 mg/KG IV every 2 weeks.  First dose was given February 05, 2019. Status post 4 cycles.  INTERVAL HISTORY: Alexander Hatfield. 63 y.o. male returns to the clinic for a follow-up visit.  The patient is feeling well today without any concerning complaints except for baseline shortness of breath and a productive cough.  He continues to tolerate his treatment well without any adverse effects.  He denies any fever, chills, night sweats, or weight loss.  He denies any chest pain or hemoptysis.  He denies any nausea, vomiting, diarrhea, or constipation.  He denies any rashes or skin changes.  He is here today for evaluation before starting cycle #5.  MEDICAL HISTORY: Past Medical History:  Diagnosis Date  . Arthritis   . Chronic back pain   . Chronic back pain   . Headache   . HOH (hard of hearing)   . Hypertension   . Lung mass   . Pre-diabetes   . Sleep apnea    wears cpap  . Wears glasses   . Wears partial dentures     ALLERGIES:  is allergic to iohexol.  MEDICATIONS:  Current Outpatient Medications  Medication Sig Dispense Refill  . amLODipine (NORVASC) 10 MG tablet Take 10 mg by mouth daily.     .  cyclobenzaprine (FLEXERIL) 10 MG tablet Take 1 tablet (10 mg total) by mouth 3 (three) times daily as needed for muscle spasms. 30 tablet 0  . furosemide (LASIX) 20 MG tablet Take 20 mg by mouth daily.     Marland Kitchen gabapentin (NEURONTIN) 300 MG capsule Take 600 mg by mouth 3 (three) times daily.     Marland Kitchen guaifenesin (ROBITUSSIN) 100 MG/5ML syrup Take 200 mg by mouth 3 (three) times daily as needed for cough.    . losartan (COZAAR) 100 MG tablet Take 100 mg by mouth daily.     . magic mouthwash w/lidocaine SOLN Take by mouth.    . meloxicam (MOBIC) 15 MG tablet Take 15 mg by mouth daily.     Marland Kitchen oxycodone (ROXICODONE) 30 MG immediate release tablet One to two tablets every 8 hours as needed for pain.  Max 5 tab/day.  Must last 30 days.    . potassium chloride SA (K-DUR,KLOR-CON) 20 MEQ tablet Take 20 mEq by mouth every evening.     . lidocaine-prilocaine (EMLA) cream Apply 1 application topically as needed. (Patient not taking: Reported on 04/02/2019) 30 g 0  . prochlorperazine (COMPAZINE) 10 MG tablet Take 1 tablet (10 mg total) by mouth every 6 (six) hours as needed for nausea or vomiting. (Patient not taking: Reported on 04/02/2019) 30 tablet 0   No current facility-administered medications for this visit.    Facility-Administered Medications Ordered in Other Visits  Medication Dose Route Frequency Provider Last Rate  Last Dose  . durvalumab (IMFINZI) 1,500 mg in sodium chloride 0.9 % 100 mL chemo infusion  10.6 mg/kg (Treatment Plan Recorded) Intravenous Once Curt Bears, MD 130 mL/hr at 04/02/19 1426 1,500 mg at 04/02/19 1426  . heparin lock flush 100 unit/mL  500 Units Intracatheter Once PRN Curt Bears, MD      . sodium chloride flush (NS) 0.9 % injection 10 mL  10 mL Intracatheter PRN Curt Bears, MD        SURGICAL HISTORY:  Past Surgical History:  Procedure Laterality Date  . ANTERIOR CERVICAL DECOMP/DISCECTOMY FUSION N/A 10/27/2018   Procedure: ANTERIOR CERVICAL THREE-FOUR,  FOUR-FIVE DECOMPRESSION/DISCECTOMY FUSION TWO LEVELS;  Surgeon: Kary Kos, MD;  Location: [facility];  Service: Neurosurgery;  Laterality: N/A;   ANTERIOR CERVICAL THREE-FOUR, FOUR-FIVE DECOMPRESSION/DISCECTOMY FUSION TWO LEVELS  . BACK SURGERY    . IR IMAGING GUIDED PORT INSERTION  11/27/2018  . MULTIPLE TOOTH EXTRACTIONS    . SPINAL CORD STIMULATOR IMPLANT    . VIDEO BRONCHOSCOPY N/A 10/29/2018   Procedure: VIDEO BRONCHOSCOPY;  Surgeon: Laurin Coder, MD;  Location: [facility];  Service: Thoracic;  Laterality: N/A;  . VIDEO BRONCHOSCOPY WITH ENDOBRONCHIAL ULTRASOUND N/A 10/27/2018   Procedure: VIDEO BRONCHOSCOPY WITH ENDOBRONCHIAL ULTRASOUND;  Surgeon: Laurin Coder, MD;  Location: [facility];  Service: Thoracic;  Laterality: N/A;  . VIDEO BRONCHOSCOPY WITH ENDOBRONCHIAL ULTRASOUND N/A 10/29/2018   Procedure: VIDEO BRONCHOSCOPY WITH ENDOBRONCHIAL ULTRASOUND;  Surgeon: Laurin Coder, MD;  Location: [facility];  Service: Thoracic;  Laterality: N/A;    REVIEW OF SYSTEMS:   Review of Systems  Constitutional: Negative for appetite change, chills, fatigue, fever and unexpected weight change.  HENT:   Negative for mouth sores, nosebleeds, sore throat and trouble swallowing.   Eyes: Negative for eye problems and icterus.  Respiratory: Positive for productive cough and dyspnea on exertion. Negative for hemoptysis and wheezing.   Cardiovascular: Negative for chest pain and leg swelling.  Gastrointestinal: Negative for abdominal pain, constipation, diarrhea, nausea and vomiting.  Genitourinary: Negative for bladder incontinence, difficulty urinating, dysuria, frequency and hematuria.   Musculoskeletal: Positive for chronic back pain. Negative for gait problem, neck pain and neck stiffness.  Skin: Negative for itching and rash.  Neurological: Negative for dizziness, extremity weakness, gait problem, headaches, light-headedness and seizures.  Hematological: Negative for adenopathy. Does not bruise/bleed  easily.  Psychiatric/Behavioral: Negative for confusion, depression and sleep disturbance. The patient is not nervous/anxious.     PHYSICAL EXAMINATION:  Blood pressure 121/75, pulse (!) 111, temperature 98.7 F (37.1 C), temperature source Oral, resp. rate 18, height 6\' 1"  (1.854 m), weight (!) 317 lb (143.8 kg), SpO2 96 %.  ECOG PERFORMANCE STATUS: 1 - Symptomatic but completely ambulatory  Physical Exam  Constitutional: Oriented to person, place, and time and well-developed, well-nourished, and in no distress.  HENT:  Head: Normocephalic and atraumatic.  Mouth/Throat: Oropharynx is clear and moist. No oropharyngeal exudate.  Eyes: Conjunctivae are normal. Right eye exhibits no discharge. Left eye exhibits no discharge. No scleral icterus.  Neck: Normal range of motion. Neck supple.  Cardiovascular: Normal rate, regular rhythm, normal heart sounds and intact distal pulses.   Pulmonary/Chest: Effort normal and breath sounds normal. No respiratory distress. No wheezes. No rales.  Abdominal: Soft. Bowel sounds are normal. Exhibits no distension and no mass. There is no tenderness.  Musculoskeletal: Normal range of motion. Exhibits no edema.  Lymphadenopathy:    No cervical adenopathy.  Neurological: Alert and oriented to person, place, and time.  Exhibits normal muscle tone. Gait normal. Coordination normal.  Skin: Skin is warm and dry. No rash noted. Not diaphoretic. No erythema. No pallor.  Psychiatric: Mood, memory and judgment normal.  Vitals reviewed.  LABORATORY DATA: Lab Results  Component Value Date   WBC 7.8 04/02/2019   HGB 13.2 04/02/2019   HCT 42.0 04/02/2019   MCV 93.3 04/02/2019   PLT 307 04/02/2019      Chemistry      Component Value Date/Time   NA 140 04/02/2019 1256   K 4.0 04/02/2019 1256   CL 104 04/02/2019 1256   CO2 26 04/02/2019 1256   BUN 13 04/02/2019 1256   CREATININE 1.08 04/02/2019 1256      Component Value Date/Time   CALCIUM 9.0 04/02/2019  1256   ALKPHOS 131 (H) 04/02/2019 1256   AST 24 04/02/2019 1256   ALT 30 04/02/2019 1256   BILITOT 0.3 04/02/2019 1256       RADIOGRAPHIC STUDIES:  No results found.   ASSESSMENT/PLAN:  This is a very pleasant 63 year old Caucasian male with stage III a non-small cell lung cancer, squamous cell carcinoma.  He presented with a right upper lobe lung mass in addition to right paratracheal lymphadenopathy.  He was diagnosed in December 2019.  The patient completed a course of concurrent chemoradiation with weekly carboplatin and paclitaxel.  He is status post 6 cycles.  He had a partial response to treatment.  He is currently undergoing consolidation immunotherapy with Imfinzi 10 mg/kg IV every 2 weeks.  He is status post 4 cycles.  The patient was seen with Dr. Julien Nordmann today.  Labs were reviewed with the patient.  We recommend that he proceed with cycle with cycle #5 today as scheduled.  We will see him back for follow-up visit in 2 weeks for evaluation and routine blood work prior to starting cycle #6. The patient was advised to call immediately if he has any concerning symptoms in the interval. The patient voices understanding of current disease status and treatment options and is in agreement with the current care plan. All questions were answered. The patient knows to call the clinic with any problems, questions or concerns. We can certainly see the patient much sooner if necessary  No orders of the defined types were placed in this encounter.    Yatzil Clippinger L Jolin Benavides, PA-C 04/02/19  ADDENDUM: Hematology/Oncology Attending: I had a face-to-face encounter with the patient today.  I recommended his care plan.  This is a very pleasant 63 years old white male with stage IIIA non-small cell lung cancer, squamous cell carcinoma status post concurrent chemoradiation with weekly carboplatin and paclitaxel with partial response.  The patient is currently undergoing consolidation  therapy with Imfinzi therapy 2 weeks.  Status post 4 cycles.  The patient continues to tolerate the treatment well. I recommended for him to proceed with cycle #5 today as planned. The patient will come back for follow-up visit in 2 weeks for evaluation before starting cycle #6. He was advised to call immediately if he has any concerning symptoms in the interval.  Disclaimer: This note was dictated with voice recognition software. Similar sounding words can inadvertently be transcribed and may be missed upon review. Eilleen Kempf, MD 04/02/19

## 2019-04-07 ENCOUNTER — Telehealth: Payer: Self-pay | Admitting: Physician Assistant

## 2019-04-07 NOTE — Telephone Encounter (Signed)
scheduled appt per 5/21 los - pt to get an updated schedule next visit

## 2019-04-16 ENCOUNTER — Encounter: Payer: Self-pay | Admitting: Internal Medicine

## 2019-04-16 ENCOUNTER — Inpatient Hospital Stay: Payer: Medicare Other

## 2019-04-16 ENCOUNTER — Other Ambulatory Visit: Payer: Self-pay

## 2019-04-16 ENCOUNTER — Inpatient Hospital Stay: Payer: Medicare Other | Admitting: Internal Medicine

## 2019-04-16 ENCOUNTER — Inpatient Hospital Stay: Payer: Medicare Other | Attending: Internal Medicine

## 2019-04-16 VITALS — BP 114/70 | HR 100 | Temp 97.6°F | Resp 18 | Ht 73.0 in | Wt 326.2 lb

## 2019-04-16 DIAGNOSIS — Z95828 Presence of other vascular implants and grafts: Secondary | ICD-10-CM

## 2019-04-16 DIAGNOSIS — C3491 Malignant neoplasm of unspecified part of right bronchus or lung: Secondary | ICD-10-CM

## 2019-04-16 DIAGNOSIS — C3411 Malignant neoplasm of upper lobe, right bronchus or lung: Secondary | ICD-10-CM | POA: Diagnosis not present

## 2019-04-16 DIAGNOSIS — Z79899 Other long term (current) drug therapy: Secondary | ICD-10-CM | POA: Diagnosis not present

## 2019-04-16 DIAGNOSIS — Z5112 Encounter for antineoplastic immunotherapy: Secondary | ICD-10-CM

## 2019-04-16 DIAGNOSIS — C3490 Malignant neoplasm of unspecified part of unspecified bronchus or lung: Secondary | ICD-10-CM

## 2019-04-16 LAB — CMP (CANCER CENTER ONLY)
ALT: 24 U/L (ref 0–44)
AST: 27 U/L (ref 15–41)
Albumin: 3.5 g/dL (ref 3.5–5.0)
Alkaline Phosphatase: 120 U/L (ref 38–126)
Anion gap: 9 (ref 5–15)
BUN: 14 mg/dL (ref 8–23)
CO2: 27 mmol/L (ref 22–32)
Calcium: 9.2 mg/dL (ref 8.9–10.3)
Chloride: 104 mmol/L (ref 98–111)
Creatinine: 0.99 mg/dL (ref 0.61–1.24)
GFR, Est AFR Am: >60 mL/min (ref >60)
GFR, Estimated: >60 mL/min (ref >60)
Glucose, Bld: 90 mg/dL (ref 70–99)
Potassium: 4.3 mmol/L (ref 3.5–5.1)
Sodium: 140 mmol/L (ref 135–145)
Total Bilirubin: 0.4 mg/dL (ref 0.3–1.2)
Total Protein: 7.5 g/dL (ref 6.5–8.1)

## 2019-04-16 LAB — CBC WITH DIFFERENTIAL (CANCER CENTER ONLY)
Abs Immature Granulocytes: 0.03 10*3/uL (ref 0.00–0.07)
Basophils Absolute: 0 10*3/uL (ref 0.0–0.1)
Basophils Relative: 0 %
Eosinophils Absolute: 0.5 10*3/uL (ref 0.0–0.5)
Eosinophils Relative: 7 %
HCT: 41.2 % (ref 39.0–52.0)
Hemoglobin: 12.6 g/dL — ABNORMAL LOW (ref 13.0–17.0)
Immature Granulocytes: 0 %
Lymphocytes Relative: 24 %
Lymphs Abs: 1.6 10*3/uL (ref 0.7–4.0)
MCH: 29 pg (ref 26.0–34.0)
MCHC: 30.6 g/dL (ref 30.0–36.0)
MCV: 94.9 fL (ref 80.0–100.0)
Monocytes Absolute: 0.8 10*3/uL (ref 0.1–1.0)
Monocytes Relative: 12 %
Neutro Abs: 3.8 10*3/uL (ref 1.7–7.7)
Neutrophils Relative %: 57 %
Platelet Count: 263 10*3/uL (ref 150–400)
RBC: 4.34 MIL/uL (ref 4.22–5.81)
RDW: 13.5 % (ref 11.5–15.5)
WBC Count: 6.7 10*3/uL (ref 4.0–10.5)
nRBC: 0 % (ref 0.0–0.2)

## 2019-04-16 LAB — TSH: TSH: 1.079 u[IU]/mL (ref 0.320–4.118)

## 2019-04-16 MED ORDER — SODIUM CHLORIDE 0.9% FLUSH
10.0000 mL | INTRAVENOUS | Status: DC | PRN
  Administered 2019-04-16: 10 mL
  Filled 2019-04-16: qty 10

## 2019-04-16 MED ORDER — HEPARIN SOD (PORK) LOCK FLUSH 100 UNIT/ML IV SOLN
500.0000 [IU] | Freq: Once | INTRAVENOUS | Status: AC | PRN
  Administered 2019-04-16: 500 [IU]
  Filled 2019-04-16: qty 5

## 2019-04-16 MED ORDER — SODIUM CHLORIDE 0.9 % IV SOLN
Freq: Once | INTRAVENOUS | Status: AC
  Administered 2019-04-16: 14:00:00 via INTRAVENOUS
  Filled 2019-04-16: qty 250

## 2019-04-16 MED ORDER — SODIUM CHLORIDE 0.9% FLUSH
10.0000 mL | Freq: Once | INTRAVENOUS | Status: AC
  Administered 2019-04-16: 10 mL
  Filled 2019-04-16: qty 10

## 2019-04-16 MED ORDER — SODIUM CHLORIDE 0.9 % IV SOLN
10.6000 mg/kg | Freq: Once | INTRAVENOUS | Status: AC
  Administered 2019-04-16: 1500 mg via INTRAVENOUS
  Filled 2019-04-16: qty 30

## 2019-04-16 NOTE — Patient Instructions (Signed)
[facility] Discharge Instructions for Patients Receiving Chemotherapy  Today you received the following Immunotherapy agent today: Imfinzi  To help prevent nausea and vomiting after your treatment, we encourage you to take your nausea medication as directed by your MD.   If you develop nausea and vomiting that is not controlled by your nausea medication, call the clinic.   BELOW ARE SYMPTOMS THAT SHOULD BE REPORTED IMMEDIATELY:  *FEVER GREATER THAN 100.5 F  *CHILLS WITH OR WITHOUT FEVER  NAUSEA AND VOMITING THAT IS NOT CONTROLLED WITH YOUR NAUSEA MEDICATION  *UNUSUAL SHORTNESS OF BREATH  *UNUSUAL BRUISING OR BLEEDING  TENDERNESS IN MOUTH AND THROAT WITH OR WITHOUT PRESENCE OF ULCERS  *URINARY PROBLEMS  *BOWEL PROBLEMS  UNUSUAL RASH Items with * indicate a potential emergency and should be followed up as soon as possible.  Feel free to call the clinic should you have any questions or concerns. The clinic phone number is [phone].  Please show the [facility] at check-in to the Emergency Department and triage nurse.  Coronavirus (COVID-19) Are you at risk?  Are you at risk for the Coronavirus (COVID-19)?  To be considered HIGH RISK for Coronavirus (COVID-19), you have to meet the following criteria:  . Traveled to [place], [place], [place], [place] or [place]; or in the [place] to [place], [place], [place], or [place]; and have fever, cough, and shortness of breath within the last 2 weeks of travel OR . Been in close contact with a person diagnosed with COVID-19 within the last 2 weeks and have fever, cough, and shortness of breath . IF YOU DO NOT MEET THESE CRITERIA, YOU ARE CONSIDERED LOW RISK FOR COVID-19.  What to do if you are HIGH RISK for COVID-19?  Marland Kitchen If you are having a medical emergency, call 911. . Seek medical care right away. Before you go to a doctor's office, urgent care or emergency department, call ahead and tell them  about your recent travel, contact with someone diagnosed with COVID-19, and your symptoms. You should receive instructions from your physician's office regarding next steps of care.  . When you arrive at healthcare provider, tell the healthcare staff immediately you have returned from visiting [place], [place], [place], [place] or [place]; or traveled in the [place] to [place], [name], [place], or [place]; in the last two weeks or you have been in close contact with a person diagnosed with COVID-19 in the last 2 weeks.   . Tell the health care staff about your symptoms: fever, cough and shortness of breath. . After you have been seen by a medical provider, you will be either: o Tested for (COVID-19) and discharged home on quarantine except to seek medical care if symptoms worsen, and asked to  - Stay home and avoid contact with others until you get your results (4-5 days)  - Avoid travel on public transportation if possible (such as bus, train, or airplane) or o Sent to the Emergency Department by EMS for evaluation, COVID-19 testing, and possible admission depending on your condition and test results.  What to do if you are LOW RISK for COVID-19?  Reduce your risk of any infection by using the same precautions used for avoiding the common cold or flu:  Marland Kitchen Wash your hands often with soap and warm water for at least 20 seconds.  If soap and water are not readily available, use an alcohol-based hand sanitizer with at least 60% alcohol.  Marland Kitchen  If coughing or sneezing, cover your mouth and nose by coughing or sneezing into the elbow areas of your shirt or coat, into a tissue or into your sleeve (not your hands). . Avoid shaking hands with others and consider head nods or verbal greetings only. . Avoid touching your eyes, nose, or mouth with unwashed hands.  . Avoid close contact with people who are sick. . Avoid places or events with large numbers of people in one location, like concerts or  sporting events. . Carefully consider travel plans you have or are making. . If you are planning any travel outside or inside the [place], visit the CDC's Travelers' Health webpage for the latest health notices. . If you have some symptoms but not all symptoms, continue to monitor at home and seek medical attention if your symptoms worsen. . If you are having a medical emergency, call 911.   [facility] / e-Visit: [email or web address]         [facility]: [facility]: [phone]                   [facility]: [phone]

## 2019-04-16 NOTE — Progress Notes (Signed)
[facility] Telephone:(336) [phone]   Fax:(336) [phone]  OFFICE PROGRESS NOTE  [name], MD [place] [address]  DIAGNOSIS: stage IIIa (T2b, N2, M0) non-small cell lung cancer, squamous cell carcinoma presented with right upper lobe lung mass in addition to right paratracheal lymphadenopathy diagnosed in December 2019. The patient also has a hypermetabolic lesion in the left parotid gland that need further evaluation.  PRIOR THERAPY:  concurrent chemoradiation with weekly carboplatin for AUC of 2 and paclitaxel 45 mg/M2.  First dose started on 12/02/2018. Status post 6 cycles.  Last cycle was given on January 05, 2019 the patient is receiving radiation closer to home in [place], [place] completed January 12, 2019.   CURRENT THERAPY:  Consolidation treatment with immunotherapy with Imfinzi 10 mg/KG IV every 2 weeks.  First dose was given February 05, 2019. Status post 5 cycles.  INTERVAL HISTORY: Alexander Hatfield. 63 y.o. male returns to the clinic today for follow-up visit.  The patient is feeling fine today with no concerning complaints.  The patient denied having any chest pain, shortness of breath except with exertion with no cough or hemoptysis.  He denied having any fever or chills.  He has no nausea, vomiting diarrhea or constipation.  He continues to tolerate his treatment with Imfinzi fairly well.  He is here today for evaluation before starting cycle #6.  MEDICAL HISTORY: Past Medical History:  Diagnosis Date  . Arthritis   . Chronic back pain   . Chronic back pain   . Headache   . HOH (hard of hearing)   . Hypertension   . Lung mass   . Pre-diabetes   . Sleep apnea    wears cpap  . Wears glasses   . Wears partial dentures     ALLERGIES:  is allergic to iohexol.  MEDICATIONS:  Current Outpatient Medications  Medication Sig Dispense Refill  . amLODipine (NORVASC) 10 MG tablet Take 10 mg by mouth daily.     .  cyclobenzaprine (FLEXERIL) 10 MG tablet Take 1 tablet (10 mg total) by mouth 3 (three) times daily as needed for muscle spasms. 30 tablet 0  . furosemide (LASIX) 20 MG tablet Take 20 mg by mouth daily.     Marland Kitchen gabapentin (NEURONTIN) 300 MG capsule Take 600 mg by mouth 3 (three) times daily.     Marland Kitchen guaifenesin (ROBITUSSIN) 100 MG/5ML syrup Take 200 mg by mouth 3 (three) times daily as needed for cough.    . lidocaine-prilocaine (EMLA) cream Apply 1 application topically as needed. (Patient not taking: Reported on 04/02/2019) 30 g 0  . losartan (COZAAR) 100 MG tablet Take 100 mg by mouth daily.     . magic mouthwash w/lidocaine SOLN Take by mouth.    . meloxicam (MOBIC) 15 MG tablet Take 15 mg by mouth daily.     Marland Kitchen oxycodone (ROXICODONE) 30 MG immediate release tablet One to two tablets every 8 hours as needed for pain.  Max 5 tab/day.  Must last 30 days.    . potassium chloride SA (K-DUR,KLOR-CON) 20 MEQ tablet Take 20 mEq by mouth every evening.     . prochlorperazine (COMPAZINE) 10 MG tablet Take 1 tablet (10 mg total) by mouth every 6 (six) hours as needed for nausea or vomiting. (Patient not taking: Reported on 04/02/2019) 30 tablet 0   No current facility-administered medications for this visit.     SURGICAL HISTORY:  Past Surgical History:  Procedure Laterality Date  . ANTERIOR CERVICAL DECOMP/DISCECTOMY FUSION N/A 10/27/2018   Procedure: ANTERIOR CERVICAL THREE-FOUR, FOUR-FIVE DECOMPRESSION/DISCECTOMY FUSION TWO LEVELS;  Surgeon: Kary Kos, MD;  Location: [facility];  Service: Neurosurgery;  Laterality: N/A;   ANTERIOR CERVICAL THREE-FOUR, FOUR-FIVE DECOMPRESSION/DISCECTOMY FUSION TWO LEVELS  . BACK SURGERY    . IR IMAGING GUIDED PORT INSERTION  11/27/2018  . MULTIPLE TOOTH EXTRACTIONS    . SPINAL CORD STIMULATOR IMPLANT    . VIDEO BRONCHOSCOPY N/A 10/29/2018   Procedure: VIDEO BRONCHOSCOPY;  Surgeon: Laurin Coder, MD;  Location: [facility];  Service: Thoracic;  Laterality: N/A;  . VIDEO  BRONCHOSCOPY WITH ENDOBRONCHIAL ULTRASOUND N/A 10/27/2018   Procedure: VIDEO BRONCHOSCOPY WITH ENDOBRONCHIAL ULTRASOUND;  Surgeon: Laurin Coder, MD;  Location: [facility];  Service: Thoracic;  Laterality: N/A;  . VIDEO BRONCHOSCOPY WITH ENDOBRONCHIAL ULTRASOUND N/A 10/29/2018   Procedure: VIDEO BRONCHOSCOPY WITH ENDOBRONCHIAL ULTRASOUND;  Surgeon: Laurin Coder, MD;  Location[facility];  Service: Thoracic;  Laterality: N/A;    REVIEW OF SYSTEMS:  A comprehensive review of systems was negative except for: Respiratory: positive for dyspnea on exertion   PHYSICAL EXAMINATION: General appearance: alert, cooperative and no distress Head: Normocephalic, without obvious abnormality, atraumatic Neck: no adenopathy, no JVD, supple, symmetrical, trachea midline and thyroid not enlarged, symmetric, no tenderness/mass/nodules Lymph nodes: Cervical, supraclavicular, and axillary nodes normal. Resp: clear to auscultation bilaterally Back: symmetric, no curvature. ROM normal. No CVA tenderness. Cardio: regular rate and rhythm, S1, S2 normal, no murmur, click, rub or gallop GI: soft, non-tender; bowel sounds normal; no masses,  no organomegaly Extremities: extremities normal, atraumatic, no cyanosis or edema  ECOG PERFORMANCE STATUS: 1 - Symptomatic but completely ambulatory  Blood pressure 114/70, pulse 100, temperature 97.6 F (36.4 C), temperature source Oral, resp. rate 18, height 6\' 1"  (1.854 m), weight (!) 326 lb 3.2 oz (148 kg), SpO2 95 %.  LABORATORY DATA: Lab Results  Component Value Date   WBC 6.7 04/16/2019   HGB 12.6 (L) 04/16/2019   HCT 41.2 04/16/2019   MCV 94.9 04/16/2019   PLT 263 04/16/2019      Chemistry      Component Value Date/Time   NA 140 04/02/2019 1256   K 4.0 04/02/2019 1256   CL 104 04/02/2019 1256   CO2 26 04/02/2019 1256   BUN 13 04/02/2019 1256   CREATININE 1.08 04/02/2019 1256      Component Value Date/Time   CALCIUM 9.0 04/02/2019 1256   ALKPHOS 131 (H)  04/02/2019 1256   AST 24 04/02/2019 1256   ALT 30 04/02/2019 1256   BILITOT 0.3 04/02/2019 1256       RADIOGRAPHIC STUDIES: No results found.  ASSESSMENT AND PLAN: This is a very pleasant 63 years old white male with a stage IIIa non-small cell lung cancer, squamous cell carcinoma diagnosed in December 2019. The patient completed a course of treatment with concurrent chemoradiation with weekly carboplatin and paclitaxel status post 6 cycles.  He had partial response to this treatment. He was a started on consolidation treatment with Imfinzi status post 5 cycles. The patient continues to tolerate this treatment well with no concerning adverse effects. I recommended for him to proceed with cycle #6 today as planned. I will see him back for follow-up visit in 2 weeks for evaluation after repeating CT scan of the chest for restaging of his disease. The patient was advised to call immediately if he has any concerning symptoms in the interval. The patient voices understanding of  current disease status and treatment options and is in agreement with the current care plan. All questions were answered. The patient knows to call the clinic with any problems, questions or concerns. We can certainly see the patient much sooner if necessary.  I spent 10 minutes counseling the patient face to face. The total time spent in the appointment was 15 minutes.  Disclaimer: This note was dictated with voice recognition software. Similar sounding words can inadvertently be transcribed and may not be corrected upon review.

## 2019-04-20 DIAGNOSIS — M545 Low back pain: Secondary | ICD-10-CM | POA: Diagnosis not present

## 2019-04-20 DIAGNOSIS — I10 Essential (primary) hypertension: Secondary | ICD-10-CM | POA: Diagnosis not present

## 2019-04-20 DIAGNOSIS — C3490 Malignant neoplasm of unspecified part of unspecified bronchus or lung: Secondary | ICD-10-CM | POA: Diagnosis not present

## 2019-04-20 DIAGNOSIS — G4733 Obstructive sleep apnea (adult) (pediatric): Secondary | ICD-10-CM | POA: Diagnosis not present

## 2019-04-21 ENCOUNTER — Telehealth: Payer: Self-pay | Admitting: Medical Oncology

## 2019-04-21 NOTE — Telephone Encounter (Signed)
CT scan scheduled at [facility] on June 26th ( that is the soonest they can scan him) . Is that ok?  Next imfinzi June 18th.

## 2019-04-21 NOTE — Telephone Encounter (Signed)
Transportation is a problem. Message sent to transportation coordinator for lyft to pick up pt on June 15th  for scan in [place]. Central scheduling will let me know if order needs to be re-enterd.

## 2019-04-22 NOTE — Telephone Encounter (Signed)
Pt said he will need a lyft ride if his CT is on June 16th. Message sent to transportation coordinator and Central scheduling.

## 2019-04-27 ENCOUNTER — Encounter (HOSPITAL_COMMUNITY): Payer: Self-pay

## 2019-04-27 ENCOUNTER — Telehealth: Payer: Self-pay | Admitting: *Deleted

## 2019-04-27 ENCOUNTER — Ambulatory Visit (HOSPITAL_COMMUNITY)
Admission: RE | Admit: 2019-04-27 | Discharge: 2019-04-27 | Disposition: A | Discharge status: Home / Self Care | Payer: Medicare Other | Source: Ambulatory Visit | Attending: Internal Medicine | Admitting: Internal Medicine

## 2019-04-27 ENCOUNTER — Other Ambulatory Visit: Payer: Self-pay

## 2019-04-27 DIAGNOSIS — C3490 Malignant neoplasm of unspecified part of unspecified bronchus or lung: Secondary | ICD-10-CM | POA: Insufficient documentation

## 2019-04-27 DIAGNOSIS — C3411 Malignant neoplasm of upper lobe, right bronchus or lung: Secondary | ICD-10-CM | POA: Diagnosis not present

## 2019-04-27 MED ORDER — SODIUM CHLORIDE (PF) 0.9 % IJ SOLN
INTRAMUSCULAR | Status: AC
  Filled 2019-04-27: qty 50

## 2019-04-27 NOTE — Telephone Encounter (Signed)
"  [name] CT calling about Alexander Hatfield. here now for Ct chest with contrast.  Has allergy and not pre-medicated.  Does he need to reschedule or CT chest without.?"  Verbal order received and read back from Dr. Julien Nordmann for CT chest without contrast.  Order given to [facility] at this time. Order entered at this time.

## 2019-04-30 ENCOUNTER — Inpatient Hospital Stay: Payer: Medicare Other

## 2019-04-30 ENCOUNTER — Other Ambulatory Visit: Payer: Self-pay

## 2019-04-30 ENCOUNTER — Encounter: Payer: Self-pay | Admitting: Internal Medicine

## 2019-04-30 ENCOUNTER — Inpatient Hospital Stay: Payer: Medicare Other | Admitting: Internal Medicine

## 2019-04-30 VITALS — BP 126/78 | HR 100 | Temp 99.0°F | Resp 18 | Ht 73.0 in | Wt 322.6 lb

## 2019-04-30 DIAGNOSIS — C3491 Malignant neoplasm of unspecified part of right bronchus or lung: Secondary | ICD-10-CM

## 2019-04-30 DIAGNOSIS — Z5112 Encounter for antineoplastic immunotherapy: Secondary | ICD-10-CM | POA: Diagnosis not present

## 2019-04-30 DIAGNOSIS — Z79899 Other long term (current) drug therapy: Secondary | ICD-10-CM | POA: Diagnosis not present

## 2019-04-30 DIAGNOSIS — C3411 Malignant neoplasm of upper lobe, right bronchus or lung: Secondary | ICD-10-CM | POA: Diagnosis not present

## 2019-04-30 DIAGNOSIS — Z95828 Presence of other vascular implants and grafts: Secondary | ICD-10-CM

## 2019-04-30 DIAGNOSIS — I10 Essential (primary) hypertension: Secondary | ICD-10-CM

## 2019-04-30 LAB — CBC WITH DIFFERENTIAL (CANCER CENTER ONLY)
Abs Immature Granulocytes: 0.02 10*3/uL (ref 0.00–0.07)
Basophils Absolute: 0.1 10*3/uL (ref 0.0–0.1)
Basophils Relative: 1 %
Eosinophils Absolute: 0.4 10*3/uL (ref 0.0–0.5)
Eosinophils Relative: 5 %
HCT: 44.1 % (ref 39.0–52.0)
Hemoglobin: 13.7 g/dL (ref 13.0–17.0)
Immature Granulocytes: 0 %
Lymphocytes Relative: 21 %
Lymphs Abs: 1.6 10*3/uL (ref 0.7–4.0)
MCH: 29.1 pg (ref 26.0–34.0)
MCHC: 31.1 g/dL (ref 30.0–36.0)
MCV: 93.8 fL (ref 80.0–100.0)
Monocytes Absolute: 1 10*3/uL (ref 0.1–1.0)
Monocytes Relative: 14 %
Neutro Abs: 4.6 10*3/uL (ref 1.7–7.7)
Neutrophils Relative %: 59 %
Platelet Count: 286 10*3/uL (ref 150–400)
RBC: 4.7 MIL/uL (ref 4.22–5.81)
RDW: 13.4 % (ref 11.5–15.5)
WBC Count: 7.6 10*3/uL (ref 4.0–10.5)
nRBC: 0 % (ref 0.0–0.2)

## 2019-04-30 LAB — CMP (CANCER CENTER ONLY)
ALT: 25 U/L (ref 0–44)
AST: 25 U/L (ref 15–41)
Albumin: 3.8 g/dL (ref 3.5–5.0)
Alkaline Phosphatase: 116 U/L (ref 38–126)
Anion gap: 12 (ref 5–15)
BUN: 16 mg/dL (ref 8–23)
CO2: 25 mmol/L (ref 22–32)
Calcium: 9.3 mg/dL (ref 8.9–10.3)
Chloride: 103 mmol/L (ref 98–111)
Creatinine: 1.11 mg/dL (ref 0.61–1.24)
GFR, Est AFR Am: >60 mL/min (ref >60)
GFR, Estimated: >60 mL/min (ref >60)
Glucose, Bld: 90 mg/dL (ref 70–99)
Potassium: 4.8 mmol/L (ref 3.5–5.1)
Sodium: 140 mmol/L (ref 135–145)
Total Bilirubin: 0.3 mg/dL (ref 0.3–1.2)
Total Protein: 7.8 g/dL (ref 6.5–8.1)

## 2019-04-30 MED ORDER — HEPARIN SOD (PORK) LOCK FLUSH 100 UNIT/ML IV SOLN
500.0000 [IU] | Freq: Once | INTRAVENOUS | Status: AC | PRN
  Administered 2019-04-30: 500 [IU]
  Filled 2019-04-30: qty 5

## 2019-04-30 MED ORDER — SODIUM CHLORIDE 0.9% FLUSH
10.0000 mL | INTRAVENOUS | Status: DC | PRN
  Administered 2019-04-30: 10 mL
  Filled 2019-04-30: qty 10

## 2019-04-30 MED ORDER — SODIUM CHLORIDE 0.9 % IV SOLN
10.6000 mg/kg | Freq: Once | INTRAVENOUS | Status: AC
  Administered 2019-04-30: 10:00:00 1500 mg via INTRAVENOUS
  Filled 2019-04-30: qty 30

## 2019-04-30 MED ORDER — SODIUM CHLORIDE 0.9 % IV SOLN
Freq: Once | INTRAVENOUS | Status: AC
  Administered 2019-04-30: 10:00:00 via INTRAVENOUS
  Filled 2019-04-30: qty 250

## 2019-04-30 MED ORDER — SODIUM CHLORIDE 0.9% FLUSH
10.0000 mL | Freq: Once | INTRAVENOUS | Status: AC
  Administered 2019-04-30: 10 mL
  Filled 2019-04-30: qty 10

## 2019-04-30 NOTE — Progress Notes (Signed)
Patient noted to have unstable gait as he left infusion room. Reports having multiple back surgeries and just started walking without a cane or wheelchair again. Insisted he did not need a wheelchair or any help going out and that he was okay.

## 2019-04-30 NOTE — Progress Notes (Signed)
[facility] [phone]   Fax:([phone]  OFFICE PROGRESS NOTE  [name], MD [place] [address]  DIAGNOSIS: stage IIIA (T2b, N2, M0) non-small cell lung cancer, squamous cell carcinoma presented with right upper lobe lung mass in addition to right paratracheal lymphadenopathy diagnosed in December 2019. The patient also has a hypermetabolic lesion in the left parotid gland that need further evaluation.  PRIOR THERAPY:  concurrent chemoradiation with weekly carboplatin for AUC of 2 and paclitaxel 45 mg/M2.  First dose started on 12/02/2018. Status post 6 cycles.  Last cycle was given on January 05, 2019 the patient is receiving radiation closer to home in [place], [place] completed January 12, 2019.   CURRENT THERAPY:  Consolidation treatment with immunotherapy with Imfinzi 10 mg/KG IV every 2 weeks.  First dose was given February 05, 2019. Status post 6 cycles.  INTERVAL HISTORY: Alexander Hatfield. 63 y.o. male returns to the clinic today for follow-up visit.  His wife was available by phone during the visit.  The patient is feeling fine today with no concerning complaints.  He continues to play golf at regular basis with no limitations.  He denied having any chest pain, shortness of breath, cough or hemoptysis.  He denied having any fever or chills.  He has no nausea, vomiting, diarrhea or constipation.  His primary care physician started him on Anoro inhaler.  He also discontinued his treatment with Flexeril and started him on baclofen.  The patient has no fever or chills.  He has no weight loss or night sweats.  He has been tolerating his treatment with Imfinzi fairly well.  He had repeat CT scan of the chest performed recently and he is here for evaluation and discussion of her scan results.  MEDICAL HISTORY: Past Medical History:  Diagnosis Date   Arthritis    Chronic back pain    Chronic back pain    Headache    HOH  (hard of hearing)    Hypertension    Lung mass    Pre-diabetes    Sleep apnea    wears cpap   Wears glasses    Wears partial dentures     ALLERGIES:  is allergic to iohexol.  MEDICATIONS:  Current Outpatient Medications  Medication Sig Dispense Refill   amLODipine (NORVASC) 10 MG tablet Take 10 mg by mouth daily.      cyclobenzaprine (FLEXERIL) 10 MG tablet Take 1 tablet (10 mg total) by mouth 3 (three) times daily as needed for muscle spasms. 30 tablet 0   furosemide (LASIX) 20 MG tablet Take 20 mg by mouth daily.      gabapentin (NEURONTIN) 300 MG capsule Take 600 mg by mouth 3 (three) times daily.      guaifenesin (ROBITUSSIN) 100 MG/5ML syrup Take 200 mg by mouth 3 (three) times daily as needed for cough.     lidocaine-prilocaine (EMLA) cream Apply 1 application topically as needed. (Patient not taking: Reported on 04/02/2019) 30 g 0   losartan (COZAAR) 100 MG tablet Take 100 mg by mouth daily.      magic mouthwash w/lidocaine SOLN Take by mouth.     meloxicam (MOBIC) 15 MG tablet Take 15 mg by mouth daily.      oxycodone (ROXICODONE) 30 MG immediate release tablet One to two tablets every 8 hours as needed for pain.  Max 5 tab/day.  Must last 30 days.     potassium chloride SA (  K-DUR,KLOR-CON) 20 MEQ tablet Take 20 mEq by mouth every evening.      prochlorperazine (COMPAZINE) 10 MG tablet Take 1 tablet (10 mg total) by mouth every 6 (six) hours as needed for nausea or vomiting. (Patient not taking: Reported on 04/02/2019) 30 tablet 0   No current facility-administered medications for this visit.     SURGICAL HISTORY:  Past Surgical History:  Procedure Laterality Date   ANTERIOR CERVICAL DECOMP/DISCECTOMY FUSION N/A 10/27/2018   Procedure: ANTERIOR CERVICAL THREE-FOUR, FOUR-FIVE DECOMPRESSION/DISCECTOMY FUSION TWO LEVELS;  Surgeon: Kary Kos, MD;  Location: [facility];  Service: Neurosurgery;  Laterality: N/A;   ANTERIOR CERVICAL THREE-FOUR, FOUR-FIVE  DECOMPRESSION/DISCECTOMY FUSION TWO LEVELS   BACK SURGERY     IR IMAGING GUIDED PORT INSERTION  11/27/2018   MULTIPLE TOOTH EXTRACTIONS     SPINAL CORD STIMULATOR IMPLANT     VIDEO BRONCHOSCOPY N/A 10/29/2018   Procedure: VIDEO BRONCHOSCOPY;  Surgeon: Laurin Coder, MD;  Location: [facility];  Service: Thoracic;  Laterality: N/A;   VIDEO BRONCHOSCOPY WITH ENDOBRONCHIAL ULTRASOUND N/A 10/27/2018   Procedure: VIDEO BRONCHOSCOPY WITH ENDOBRONCHIAL ULTRASOUND;  Surgeon: Laurin Coder, MD;  Location: [facility];  Service: Thoracic;  Laterality: N/A;   VIDEO BRONCHOSCOPY WITH ENDOBRONCHIAL ULTRASOUND N/A 10/29/2018   Procedure: VIDEO BRONCHOSCOPY WITH ENDOBRONCHIAL ULTRASOUND;  Surgeon: Laurin Coder, MD;  Location[facility];  Service: Thoracic;  Laterality: N/A;    REVIEW OF SYSTEMS:  Constitutional: negative Eyes: negative Ears, nose, mouth, throat, and face: negative Respiratory: positive for dyspnea on exertion Cardiovascular: negative Gastrointestinal: negative Genitourinary:negative Integument/breast: negative Hematologic/lymphatic: negative Musculoskeletal:negative Neurological: negative Behavioral/Psych: negative Endocrine: negative Allergic/Immunologic: negative   PHYSICAL EXAMINATION: General appearance: alert, cooperative and no distress Head: Normocephalic, without obvious abnormality, atraumatic Neck: no adenopathy, no JVD, supple, symmetrical, trachea midline and thyroid not enlarged, symmetric, no tenderness/mass/nodules Lymph nodes: Cervical, supraclavicular, and axillary nodes normal. Resp: clear to auscultation bilaterally Back: symmetric, no curvature. ROM normal. No CVA tenderness. Cardio: regular rate and rhythm, S1, S2 normal, no murmur, click, rub or gallop GI: soft, non-tender; bowel sounds normal; no masses,  no organomegaly Extremities: extremities normal, atraumatic, no cyanosis or edema Neurologic: Alert and oriented X 3, normal strength and tone.  Normal symmetric reflexes. Normal coordination and gait  ECOG PERFORMANCE STATUS: 1 - Symptomatic but completely ambulatory  Blood pressure 126/78, pulse 100, temperature 99 F (37.2 C), temperature source Oral, resp. rate 18, height 6\' 1"  (1.854 m), weight (!) 322 lb 9.6 oz (146.3 kg), SpO2 94 %.  LABORATORY DATA: Lab Results  Component Value Date   WBC 7.6 04/30/2019   HGB 13.7 04/30/2019   HCT 44.1 04/30/2019   MCV 93.8 04/30/2019   PLT 286 04/30/2019      Chemistry      Component Value Date/Time   NA 140 04/16/2019 1145   K 4.3 04/16/2019 1145   CL 104 04/16/2019 1145   CO2 27 04/16/2019 1145   BUN 14 04/16/2019 1145   CREATININE 0.99 04/16/2019 1145      Component Value Date/Time   CALCIUM 9.2 04/16/2019 1145   ALKPHOS 120 04/16/2019 1145   AST 27 04/16/2019 1145   ALT 24 04/16/2019 1145   BILITOT 0.4 04/16/2019 1145       RADIOGRAPHIC STUDIES: Ct Chest Wo Contrast  Result Date: 04/27/2019 CLINICAL DATA:  Stage IIIA right upper lobe squamous cell lung cancer diagnosed December 2019 status post concurrent chemo radiation therapy with ongoing immunotherapy. Restaging. EXAM: CT CHEST WITHOUT CONTRAST TECHNIQUE: Multidetector  CT imaging of the chest was performed following the standard protocol without IV contrast. COMPARISON:  01/27/2019 chest CT. FINDINGS: Cardiovascular: Normal heart size. No significant pericardial effusion/thickening. Three-vessel coronary atherosclerosis. Right internal jugular Port-A-Cath terminates at the cavoatrial junction. Atherosclerotic thoracic aorta with stable 4.6 cm ascending thoracic aortic aneurysm. Top-normal caliber main pulmonary artery (3.3 cm diameter). Mediastinum/Nodes: Suggestion of stable hypodense 3.6 cm right thyroid lobe nodule. Esophagus within normal limits, with resolved mid to upper thoracic esophageal wall thickening. No axillary adenopathy. Mildly enlarged 1.7 cm right paratracheal node (series 2/image 59), decreased from  2.2 cm. Mildly enlarged 1.1 cm subcarinal node (series 2/image 81), previously 1.0 cm using similar measurement technique, not significantly changed. No new pathologically enlarged mediastinal nodes. No discrete hilar adenopathy on this noncontrast scan. Lungs/Pleura: No pneumothorax. No pleural effusion. Spiculated solid posterior right upper lobe 2.8 x 2.7 cm pulmonary nodule (series 7/image 59), decreased from 3.5 x 3.0 cm. New sharply marginated patchy consolidation surrounding the treated nodule in the right upper lobe and superior segment right lower lobe, with associated mild distortion, volume loss and bronchiectasis. Stable tiny calcified peripheral right middle lobe, right upper lobe and basilar left lower lobe granulomas. No new significant pulmonary nodules. Mild centrilobular emphysema. Upper abdomen: Diffuse hepatic steatosis. Stable coarse calcifications in the peripheral spleen compatible with nonspecific remote insult. Musculoskeletal: No aggressive appearing focal osseous lesions. Stable mild-to-moderate symmetric bilateral gynecomastia. Inferior approach spinal stimulator leads terminate in lower thoracic spinal canal. Partially visualized surgical hardware from ACDF in the lower cervical spine. Moderate thoracic spondylosis. IMPRESSION: 1. Continued reduction in treated posterior right upper lobe lung mass. Evolving surrounding postradiation changes in the mid to upper right lung. 2. Mediastinal lymphadenopathy is stable to mildly decreased. 3. No new or progressive metastatic disease in the chest. 4. Stable 4.6 cm ascending thoracic aortic aneurysm. Ascending thoracic aortic aneurysm. Recommend semi-annual imaging followup by CTA or MRA and referral to cardiothoracic surgery if not already obtained. This recommendation follows 2010 ACCF/AHA/AATS/ACR/ASA/SCA/SCAI/SIR/STS/SVM Guidelines for the Diagnosis and Management of Patients With Thoracic Aortic Disease. Circulation. 2010; 121: S283-T517.  Aortic aneurysm NOS (ICD10-I71.9). Aortic Atherosclerosis (ICD10-I70.0) and Emphysema (ICD10-J43.9). Electronically Signed   By: Ilona Sorrel M.D.   On: 04/27/2019 08:57    ASSESSMENT AND PLAN: This is a very pleasant 63 years old white male with a stage IIIa non-small cell lung cancer, squamous cell carcinoma diagnosed in December 2019. The patient completed a course of treatment with concurrent chemoradiation with weekly carboplatin and paclitaxel status post 6 cycles.  He had partial response to this treatment. He was a started on consolidation treatment with Imfinzi status post 6 cycles. The patient continues to tolerate his treatment well with no concerning adverse effects. He had repeat CT scan of the chest performed recently.  I personally and independently reviewed the scan and discussed the results with the patient and his wife. His scan showed further improvement of his disease. I recommended for the patient to continue his current treatment with Imfinzi and he will proceed with cycle #7 today. The patient will continue his other medication as prescribed by his primary care physician. He will come back for follow-up visit in 2 weeks for evaluation before starting cycle #8. He was advised to call immediately if he has any concerning symptoms in the interval. The patient voices understanding of current disease status and treatment options and is in agreement with the current care plan. All questions were answered. The patient knows to call the clinic with  any problems, questions or concerns. We can certainly see the patient much sooner if necessary.  Disclaimer: This note was dictated with voice recognition software. Similar sounding words can inadvertently be transcribed and may not be corrected upon review.

## 2019-04-30 NOTE — Patient Instructions (Addendum)
[facility] Discharge Instructions for Patients Receiving Chemotherapy  Today you received the following chemotherapy agents: Durvalumab (IMFINZI)  To help prevent nausea and vomiting after your treatment, we encourage you to take your nausea medication as directed.   If you develop nausea and vomiting that is not controlled by your nausea medication, call the clinic.   BELOW ARE SYMPTOMS THAT SHOULD BE REPORTED IMMEDIATELY:  *FEVER GREATER THAN 100.5 F  *CHILLS WITH OR WITHOUT FEVER  NAUSEA AND VOMITING THAT IS NOT CONTROLLED WITH YOUR NAUSEA MEDICATION  *UNUSUAL SHORTNESS OF BREATH  *UNUSUAL BRUISING OR BLEEDING  TENDERNESS IN MOUTH AND THROAT WITH OR WITHOUT PRESENCE OF ULCERS  *URINARY PROBLEMS  *BOWEL PROBLEMS  UNUSUAL RASH Items with * indicate a potential emergency and should be followed up as soon as possible.  Feel free to call the clinic should you have any questions or concerns. The clinic phone number is [phone].  Please show the [facility] at check-in to the Emergency Department and triage nurse.  Coronavirus (COVID-19) Are you at risk?  Are you at risk for the Coronavirus (COVID-19)?  To be considered HIGH RISK for Coronavirus (COVID-19), you have to meet the following criteria:  . Traveled to [place], [place], [place], [place] or [place]; or in the [place] to [place], [place], [place], or [place]; and have fever, cough, and shortness of breath within the last 2 weeks of travel OR . Been in close contact with a person diagnosed with COVID-19 within the last 2 weeks and have fever, cough, and shortness of breath . IF YOU DO NOT MEET THESE CRITERIA, YOU ARE CONSIDERED LOW RISK FOR COVID-19.  What to do if you are HIGH RISK for COVID-19?  Marland Kitchen If you are having a medical emergency, call 911. . Seek medical care right away. Before you go to a doctor's office, urgent care or emergency department, call ahead and tell them  about your recent travel, contact with someone diagnosed with COVID-19, and your symptoms. You should receive instructions from your physician's office regarding next steps of care.  . When you arrive at healthcare provider, tell the healthcare staff immediately you have returned from visiting [place], [place], [place], [place] or [place]; or traveled in the [place] to [place], [place], [place], or [place]; in the last two weeks or you have been in close contact with a person diagnosed with COVID-19 in the last 2 weeks.   . Tell the health care staff about your symptoms: fever, cough and shortness of breath. . After you have been seen by a medical provider, you will be either: o Tested for (COVID-19) and discharged home on quarantine except to seek medical care if symptoms worsen, and asked to  - Stay home and avoid contact with others until you get your results (4-5 days)  - Avoid travel on public transportation if possible (such as bus, train, or airplane) or o Sent to the Emergency Department by EMS for evaluation, COVID-19 testing, and possible admission depending on your condition and test results.  What to do if you are LOW RISK for COVID-19?  Reduce your risk of any infection by using the same precautions used for avoiding the common cold or flu:  Marland Kitchen Wash your hands often with soap and warm water for at least 20 seconds.  If soap and water are not readily available, use an alcohol-based hand sanitizer with at least 60% alcohol.  . If coughing or  sneezing, cover your mouth and nose by coughing or sneezing into the elbow areas of your shirt or coat, into a tissue or into your sleeve (not your hands). . Avoid shaking hands with others and consider head nods or verbal greetings only. . Avoid touching your eyes, nose, or mouth with unwashed hands.  . Avoid close contact with people who are sick. . Avoid places or events with large numbers of people in one location, like concerts or  sporting events. . Carefully consider travel plans you have or are making. . If you are planning any travel outside or inside the [place], visit the CDC's Travelers' Health webpage for the latest health notices. . If you have some symptoms but not all symptoms, continue to monitor at home and seek medical attention if your symptoms worsen. . If you are having a medical emergency, call 911.   [place] / e-Visit: [email or web address]         [facility]: [facility]: [phone]                   [facility]: [phone]

## 2019-05-06 DIAGNOSIS — R918 Other nonspecific abnormal finding of lung field: Secondary | ICD-10-CM | POA: Diagnosis not present

## 2019-05-06 DIAGNOSIS — J449 Chronic obstructive pulmonary disease, unspecified: Secondary | ICD-10-CM | POA: Diagnosis not present

## 2019-05-08 ENCOUNTER — Ambulatory Visit (HOSPITAL_COMMUNITY): Payer: Medicare Other

## 2019-05-14 ENCOUNTER — Inpatient Hospital Stay: Payer: Medicare Other

## 2019-05-14 ENCOUNTER — Other Ambulatory Visit: Payer: Self-pay

## 2019-05-14 ENCOUNTER — Encounter: Payer: Self-pay | Admitting: Physician Assistant

## 2019-05-14 ENCOUNTER — Inpatient Hospital Stay: Payer: Medicare Other | Admitting: Physician Assistant

## 2019-05-14 ENCOUNTER — Inpatient Hospital Stay: Payer: Medicare Other | Attending: Internal Medicine

## 2019-05-14 VITALS — BP 121/56 | HR 81 | Temp 97.8°F | Resp 18 | Ht 73.0 in | Wt 321.1 lb

## 2019-05-14 DIAGNOSIS — Z95828 Presence of other vascular implants and grafts: Secondary | ICD-10-CM

## 2019-05-14 DIAGNOSIS — C3491 Malignant neoplasm of unspecified part of right bronchus or lung: Secondary | ICD-10-CM

## 2019-05-14 DIAGNOSIS — J449 Chronic obstructive pulmonary disease, unspecified: Secondary | ICD-10-CM | POA: Diagnosis not present

## 2019-05-14 DIAGNOSIS — G620 Drug-induced polyneuropathy: Secondary | ICD-10-CM | POA: Diagnosis not present

## 2019-05-14 DIAGNOSIS — C3411 Malignant neoplasm of upper lobe, right bronchus or lung: Secondary | ICD-10-CM | POA: Insufficient documentation

## 2019-05-14 DIAGNOSIS — Z79899 Other long term (current) drug therapy: Secondary | ICD-10-CM | POA: Diagnosis not present

## 2019-05-14 DIAGNOSIS — R0609 Other forms of dyspnea: Secondary | ICD-10-CM

## 2019-05-14 DIAGNOSIS — Z5112 Encounter for antineoplastic immunotherapy: Secondary | ICD-10-CM | POA: Diagnosis not present

## 2019-05-14 DIAGNOSIS — R062 Wheezing: Secondary | ICD-10-CM

## 2019-05-14 DIAGNOSIS — G992 Myelopathy in diseases classified elsewhere: Secondary | ICD-10-CM | POA: Diagnosis not present

## 2019-05-14 LAB — CMP (CANCER CENTER ONLY)
ALT: 43 U/L (ref 0–44)
AST: 47 U/L — ABNORMAL HIGH (ref 15–41)
Albumin: 3.8 g/dL (ref 3.5–5.0)
Alkaline Phosphatase: 118 U/L (ref 38–126)
Anion gap: 11 (ref 5–15)
BUN: 24 mg/dL — ABNORMAL HIGH (ref 8–23)
CO2: 26 mmol/L (ref 22–32)
Calcium: 9.1 mg/dL (ref 8.9–10.3)
Chloride: 105 mmol/L (ref 98–111)
Creatinine: 1.31 mg/dL — ABNORMAL HIGH (ref 0.61–1.24)
GFR, Est AFR Am: >60 mL/min (ref >60)
GFR, Estimated: 57 mL/min — ABNORMAL LOW (ref >60)
Glucose, Bld: 99 mg/dL (ref 70–99)
Potassium: 4.5 mmol/L (ref 3.5–5.1)
Sodium: 142 mmol/L (ref 135–145)
Total Bilirubin: 0.3 mg/dL (ref 0.3–1.2)
Total Protein: 7.6 g/dL (ref 6.5–8.1)

## 2019-05-14 LAB — CBC WITH DIFFERENTIAL (CANCER CENTER ONLY)
Abs Immature Granulocytes: 0.02 10*3/uL (ref 0.00–0.07)
Basophils Absolute: 0 10*3/uL (ref 0.0–0.1)
Basophils Relative: 0 %
Eosinophils Absolute: 0.3 10*3/uL (ref 0.0–0.5)
Eosinophils Relative: 4 %
HCT: 41.4 % (ref 39.0–52.0)
Hemoglobin: 13 g/dL (ref 13.0–17.0)
Immature Granulocytes: 0 %
Lymphocytes Relative: 18 %
Lymphs Abs: 1.4 10*3/uL (ref 0.7–4.0)
MCH: 28.8 pg (ref 26.0–34.0)
MCHC: 31.4 g/dL (ref 30.0–36.0)
MCV: 91.6 fL (ref 80.0–100.0)
Monocytes Absolute: 0.9 10*3/uL (ref 0.1–1.0)
Monocytes Relative: 11 %
Neutro Abs: 5.1 10*3/uL (ref 1.7–7.7)
Neutrophils Relative %: 67 %
Platelet Count: 270 10*3/uL (ref 150–400)
RBC: 4.52 MIL/uL (ref 4.22–5.81)
RDW: 14.3 % (ref 11.5–15.5)
WBC Count: 7.8 10*3/uL (ref 4.0–10.5)
nRBC: 0 % (ref 0.0–0.2)

## 2019-05-14 LAB — TSH: TSH: 0.962 u[IU]/mL (ref 0.320–4.118)

## 2019-05-14 MED ORDER — SODIUM CHLORIDE 0.9 % IV SOLN
Freq: Once | INTRAVENOUS | Status: AC
  Administered 2019-05-14: 15:00:00 via INTRAVENOUS
  Filled 2019-05-14: qty 250

## 2019-05-14 MED ORDER — SODIUM CHLORIDE 0.9% FLUSH
10.0000 mL | INTRAVENOUS | Status: DC | PRN
  Administered 2019-05-14: 10 mL
  Filled 2019-05-14: qty 10

## 2019-05-14 MED ORDER — SODIUM CHLORIDE 0.9% FLUSH
10.0000 mL | Freq: Once | INTRAVENOUS | Status: AC
  Administered 2019-05-14: 10 mL
  Filled 2019-05-14: qty 10

## 2019-05-14 MED ORDER — SODIUM CHLORIDE 0.9 % IV SOLN
10.6000 mg/kg | Freq: Once | INTRAVENOUS | Status: AC
  Administered 2019-05-14: 1500 mg via INTRAVENOUS
  Filled 2019-05-14: qty 30

## 2019-05-14 MED ORDER — HEPARIN SOD (PORK) LOCK FLUSH 100 UNIT/ML IV SOLN
500.0000 [IU] | Freq: Once | INTRAVENOUS | Status: AC | PRN
  Administered 2019-05-14: 500 [IU]
  Filled 2019-05-14: qty 5

## 2019-05-14 NOTE — Progress Notes (Signed)
Alexander Hatfield  [name], MD [place] [address]  DIAGNOSIS: Stage IIIA (T2b, N2, M0) non-small cell lung cancer, squamous cell carcinoma presented with right upper lobe lung mass in addition to right paratracheal lymphadenopathy diagnosed in December 2019. The patient also has a hypermetabolic lesion in the left parotid gland that need further evaluation.  PRIOR THERAPY: Concurrent chemoradiation with weekly carboplatin for AUC of 2 and paclitaxel 45 mg/M2.First dose started on 12/02/2018.Status post 6 cycles.  Last cycle was given on January 05, 2019 the patient is receiving radiation closer to home in [place], [place] completed January 12, 2019.  CURRENT THERAPY: Consolidation treatment with immunotherapy with Imfinzi 10 mg/KG IV every 2 weeks.  First dose was given February 05, 2019. Status post 7 cycles.  INTERVAL HISTORY: Alexander Hatfield. 63 y.o. male returns to the clinic for a follow-up visit.  The patient is feeling well without any concerning complaints but continues to endorse shortness of breath with exertion.  The patient was seen by his primary doctor recently for this complaint and has been using an inhaler once a day as well as using nebulizer treatments with some relief of his shortness of breath. He tried to have PFT's performed in the past but was unable to complete the test. He still is able to golf regularly without significant limitation.   The patient also endorsed numbness and tingling in his hands and feet which predates his chemotherapy treatments. He has a history of myelopathy which he associates his symptoms with. He had a decompression/discectomy in December 2019.  He is unsure if he has any follow up appointments with his surgeon. He was prescribed gabapentin for this concern and takes it 3x a day. He does not believe this to be providing significant relief and believes it to be contributing to his mild lower  extremity swelling bilaterally.  Regarding this treatment with Imfinzi, the patient is tolerating treatment well without any adverse effects. He denies any fever, chills, night sweats, or weight loss. The patient and I discussed him trying to lose weight. He states since his is having neuropathy, he has been using a stationary bike to exercise and has been biking 10 miles a day. He denies any significant coughing, chest pain, or any hemoptysis. He denies any nausea, vomiting, diarrhea, or constipation. He denies any rashes or skin changes. He denies any headaches or visual changes. He is here today for evaluation prior to starting cycle #8.     MEDICAL HISTORY: Past Medical History:  Diagnosis Date  . Arthritis   . Chronic back pain   . Chronic back pain   . Headache   . HOH (hard of hearing)   . Hypertension   . Lung mass   . Pre-diabetes   . Sleep apnea    wears cpap  . Wears glasses   . Wears partial dentures     ALLERGIES:  is allergic to iohexol.  MEDICATIONS:  Current Outpatient Medications  Medication Sig Dispense Refill  . albuterol (PROVENTIL) (2.5 MG/3ML) 0.083% nebulizer solution VVN QID PRN    . amLODipine (NORVASC) 10 MG tablet Take 10 mg by mouth daily.     . baclofen (LIORESAL) 10 MG tablet Take 10 mg by mouth 3 (three) times daily.    . furosemide (LASIX) 20 MG tablet Take 20 mg by mouth daily.     Marland Kitchen gabapentin (NEURONTIN) 300 MG capsule Take 600 mg by mouth 3 (three)  times daily. Pt states taking 3Tablets- 300mg  tablet TID    . guaifenesin (ROBITUSSIN) 100 MG/5ML syrup Take 200 mg by mouth 3 (three) times daily as needed for cough.    . lidocaine-prilocaine (EMLA) cream Apply 1 application topically as needed. 30 g 0  . losartan (COZAAR) 100 MG tablet Take 100 mg by mouth daily.     . magic mouthwash w/lidocaine SOLN Take by mouth.    . meloxicam (MOBIC) 15 MG tablet Take 15 mg by mouth daily.     Marland Kitchen oxycodone (ROXICODONE) 30 MG immediate release tablet One to two  tablets every 8 hours as needed for pain.  Max 5 tab/day.  Must last 30 days.    . potassium chloride SA (K-DUR,KLOR-CON) 20 MEQ tablet Take 20 mEq by mouth every evening.     . prochlorperazine (COMPAZINE) 10 MG tablet Take 1 tablet (10 mg total) by mouth every 6 (six) hours as needed for nausea or vomiting. 30 tablet 0  . Umeclidinium-Vilanterol (ANORO ELLIPTA IN) Inhale into the lungs.     No current facility-administered medications for this visit.    Facility-Administered Medications Ordered in Other Visits  Medication Dose Route Frequency Provider Last Rate Last Dose  . durvalumab (IMFINZI) 1,500 mg in sodium chloride 0.9 % 100 mL chemo infusion  10.6 mg/kg (Treatment Plan Recorded) Intravenous Once Curt Bears, MD      . heparin lock flush 100 unit/mL  500 Units Intracatheter Once PRN Curt Bears, MD      . sodium chloride flush (NS) 0.9 % injection 10 mL  10 mL Intracatheter PRN Curt Bears, MD        SURGICAL HISTORY:  Past Surgical History:  Procedure Laterality Date  . ANTERIOR CERVICAL DECOMP/DISCECTOMY FUSION N/A 10/27/2018   Procedure: ANTERIOR CERVICAL THREE-FOUR, FOUR-FIVE DECOMPRESSION/DISCECTOMY FUSION TWO LEVELS;  Surgeon: Kary Kos, MD;  Location: [facility];  Service: Neurosurgery;  Laterality: N/A;   ANTERIOR CERVICAL THREE-FOUR, FOUR-FIVE DECOMPRESSION/DISCECTOMY FUSION TWO LEVELS  . BACK SURGERY    . IR IMAGING GUIDED PORT INSERTION  11/27/2018  . MULTIPLE TOOTH EXTRACTIONS    . SPINAL CORD STIMULATOR IMPLANT    . VIDEO BRONCHOSCOPY N/A 10/29/2018   Procedure: VIDEO BRONCHOSCOPY;  Surgeon: Laurin Coder, MD;  Location: [facility];  Service: Thoracic;  Laterality: N/A;  . VIDEO BRONCHOSCOPY WITH ENDOBRONCHIAL ULTRASOUND N/A 10/27/2018   Procedure: VIDEO BRONCHOSCOPY WITH ENDOBRONCHIAL ULTRASOUND;  Surgeon: Laurin Coder, MD;  Location: [facility];  Service: Thoracic;  Laterality: N/A;  . VIDEO BRONCHOSCOPY WITH ENDOBRONCHIAL ULTRASOUND N/A 10/29/2018    Procedure: VIDEO BRONCHOSCOPY WITH ENDOBRONCHIAL ULTRASOUND;  Surgeon: Laurin Coder, MD;  Location: [facility];  Service: Thoracic;  Laterality: N/A;    REVIEW OF SYSTEMS:   Review of Systems  Constitutional: Negative for appetite change, chills, fatigue, fever and unexpected weight change.  HENT:   Negative for mouth sores, nosebleeds, sore throat and trouble swallowing.   Eyes: Negative for eye problems and icterus.  Respiratory: Positive for baseline shortness of breath with exertion. Negative for cough, hemoptysis, and wheezing.   Cardiovascular: Positive for mild bilateral lower extremity swelling. Negative for chest pain.  Gastrointestinal: Negative for abdominal pain, constipation, diarrhea, nausea and vomiting.  Genitourinary: Negative for bladder incontinence, difficulty urinating, dysuria, frequency and hematuria.   Musculoskeletal: Positive for chronic back pain.  Skin: Negative for itching and rash.  Neurological: Positive for neuropathy in upper and lower extremities bilaterally. Negative for dizziness, extremity weakness, gait problem, headaches, light-headedness and seizures.  Hematological: Negative for adenopathy. Does not bruise/bleed easily.  Psychiatric/Behavioral: Negative for confusion, depression and sleep disturbance. The patient is not nervous/anxious.     PHYSICAL EXAMINATION:  Blood pressure (!) 121/56, pulse 81, temperature 97.8 F (36.6 C), temperature source Oral, resp. rate 18, height 6\' 1"  (1.854 m), weight (!) 321 lb 1.6 oz (145.7 kg), SpO2 98 %.  ECOG PERFORMANCE STATUS: 1 - Symptomatic but completely ambulatory  Physical Exam  Constitutional: Oriented to person, place, and time and obese appearing male and in no distress.  HENT:  Head: Normocephalic and atraumatic.  Mouth/Throat: Oropharynx is clear and moist. No oropharyngeal exudate.  Eyes: Conjunctivae are normal. Right eye exhibits no discharge. Left eye exhibits no discharge. No scleral icterus.   Neck: Normal range of motion. Neck supple.  Cardiovascular: Normal rate, regular rhythm, normal heart sounds and intact distal pulses.   Pulmonary/Chest: Diffuse wheezing noted. Effort normal. No respiratory distress. No rales.  Abdominal: Soft. Bowel sounds are normal. Exhibits no distension and no mass. There is no tenderness.  Musculoskeletal: Mild lower extremity edema. Normal range of motion. Lymphadenopathy:    No cervical adenopathy.  Neurological: Alert and oriented to person, place, and time. Exhibits normal muscle tone. Gait normal. Coordination normal.  Skin: Skin is warm and dry. No rash noted. Not diaphoretic. No erythema. No pallor.  Psychiatric: Mood, memory and judgment normal.  Vitals reviewed.  LABORATORY DATA: Lab Results  Component Value Date   WBC 7.8 05/14/2019   HGB 13.0 05/14/2019   HCT 41.4 05/14/2019   MCV 91.6 05/14/2019   PLT 270 05/14/2019      Chemistry      Component Value Date/Time   NA 142 05/14/2019 1258   K 4.5 05/14/2019 1258   CL 105 05/14/2019 1258   CO2 26 05/14/2019 1258   BUN 24 (H) 05/14/2019 1258   CREATININE 1.31 (H) 05/14/2019 1258      Component Value Date/Time   CALCIUM 9.1 05/14/2019 1258   ALKPHOS 118 05/14/2019 1258   AST 47 (H) 05/14/2019 1258   ALT 43 05/14/2019 1258   BILITOT 0.3 05/14/2019 1258       RADIOGRAPHIC STUDIES:  Ct Chest Wo Contrast  Result Date: 04/27/2019 CLINICAL DATA:  Stage IIIA right upper lobe squamous cell lung cancer diagnosed December 2019 status post concurrent chemo radiation therapy with ongoing immunotherapy. Restaging. EXAM: CT CHEST WITHOUT CONTRAST TECHNIQUE: Multidetector CT imaging of the chest was performed following the standard protocol without IV contrast. COMPARISON:  01/27/2019 chest CT. FINDINGS: Cardiovascular: Normal heart size. No significant pericardial effusion/thickening. Three-vessel coronary atherosclerosis. Right internal jugular Port-A-Cath terminates at the cavoatrial  junction. Atherosclerotic thoracic aorta with stable 4.6 cm ascending thoracic aortic aneurysm. Top-normal caliber main pulmonary artery (3.3 cm diameter). Mediastinum/Nodes: Suggestion of stable hypodense 3.6 cm right thyroid lobe nodule. Esophagus within normal limits, with resolved mid to upper thoracic esophageal wall thickening. No axillary adenopathy. Mildly enlarged 1.7 cm right paratracheal node (series 2/image 59), decreased from 2.2 cm. Mildly enlarged 1.1 cm subcarinal node (series 2/image 81), previously 1.0 cm using similar measurement technique, not significantly changed. No new pathologically enlarged mediastinal nodes. No discrete hilar adenopathy on this noncontrast scan. Lungs/Pleura: No pneumothorax. No pleural effusion. Spiculated solid posterior right upper lobe 2.8 x 2.7 cm pulmonary nodule (series 7/image 59), decreased from 3.5 x 3.0 cm. New sharply marginated patchy consolidation surrounding the treated nodule in the right upper lobe and superior segment right lower lobe, with associated mild distortion, volume loss  and bronchiectasis. Stable tiny calcified peripheral right middle lobe, right upper lobe and basilar left lower lobe granulomas. No new significant pulmonary nodules. Mild centrilobular emphysema. Upper abdomen: Diffuse hepatic steatosis. Stable coarse calcifications in the peripheral spleen compatible with nonspecific remote insult. Musculoskeletal: No aggressive appearing focal osseous lesions. Stable mild-to-moderate symmetric bilateral gynecomastia. Inferior approach spinal stimulator leads terminate in lower thoracic spinal canal. Partially visualized surgical hardware from ACDF in the lower cervical spine. Moderate thoracic spondylosis. IMPRESSION: 1. Continued reduction in treated posterior right upper lobe lung mass. Evolving surrounding postradiation changes in the mid to upper right lung. 2. Mediastinal lymphadenopathy is stable to mildly decreased. 3. No new or  progressive metastatic disease in the chest. 4. Stable 4.6 cm ascending thoracic aortic aneurysm. Ascending thoracic aortic aneurysm. Recommend semi-annual imaging followup by CTA or MRA and referral to cardiothoracic surgery if not already obtained. This recommendation follows 2010 ACCF/AHA/AATS/ACR/ASA/SCA/SCAI/SIR/STS/SVM Guidelines for the Diagnosis and Management of Patients With Thoracic Aortic Disease. Circulation. 2010; 121: Z610-R604. Aortic aneurysm NOS (ICD10-I71.9). Aortic Atherosclerosis (ICD10-I70.0) and Emphysema (ICD10-J43.9). Electronically Signed   By: Ilona Sorrel M.D.   On: 04/27/2019 08:57     ASSESSMENT/PLAN:  This is a very pleasant 63 year old Caucasian male with stage III non-small cell lung cancer, squamous cell carcinoma. He presented with a right upper lobe lung mass in addition to right paratracheal lymphadenopathy.  He was diagnosed in December 2019.   The patient completed a course of concurrent chemoradiation with weekly carboplatin and paclitaxel.  He is status post 6 cycles.  He had a partial response to treatment.  The patient is currently undergoing consolidation immunotherapy with Imfinzi 10 mg/kg IV every 2 weeks.  He is status post 7 cycles.  He has been tolerating treatment well without any adverse side effects.  Labs were reviewed with the patient.  I recommend he proceed with cycle #8 today as scheduled.  I will see him back for follow-up visit in 2 weeks for evaluation before starting cycle #9.  I encouraged the patient to reach out to his prescribing doctor for gabapentin for further recommendations and management of his peripheral neuropathy. I discussed that if the patient feels that his symptoms have worsened to related to his myelopathy to discuss this further with his surgeon. I also encouraged him to continue with his inhalers/nebulizer treatments as prescribed by his primary doctor for his wheezing/shortness of breath and COPD. If his symptoms worsen or  fail to improve as anticipated, he was advised to follow up with their office.   The patient was encouraged to continue exercising and to try to lose weight via healthy strategies such as eating a healthy balanced diet as well as exercising.   The patient was advised to call immediately if he has any concerning symptoms in the interval. The patient voices understanding of current disease status and treatment options and is in agreement with the current care plan. All questions were answered. The patient knows to call the clinic with any problems, questions or concerns. We can certainly see the patient much sooner if necessary  No orders of the defined types were placed in this encounter.     L , PA-C 05/14/19

## 2019-05-14 NOTE — Patient Instructions (Signed)
[facility] Discharge Instructions for Patients Receiving Chemotherapy  Today you received the following  Immunotherapy: Imfinzi  To help prevent nausea and vomiting after your treatment, we encourage you to take your nausea medication as directed by your MD.   If you develop nausea and vomiting that is not controlled by your nausea medication, call the clinic.   BELOW ARE SYMPTOMS THAT SHOULD BE REPORTED IMMEDIATELY:  *FEVER GREATER THAN 100.5 F  *CHILLS WITH OR WITHOUT FEVER  NAUSEA AND VOMITING THAT IS NOT CONTROLLED WITH YOUR NAUSEA MEDICATION  *UNUSUAL SHORTNESS OF BREATH  *UNUSUAL BRUISING OR BLEEDING  TENDERNESS IN MOUTH AND THROAT WITH OR WITHOUT PRESENCE OF ULCERS  *URINARY PROBLEMS  *BOWEL PROBLEMS  UNUSUAL RASH Items with * indicate a potential emergency and should be followed up as soon as possible.  Feel free to call the clinic should you have any questions or concerns. The clinic phone number is [phone].  Please show the [facility] at check-in to the Emergency Department and triage nurse.  Coronavirus (COVID-19) Are you at risk?  Are you at risk for the Coronavirus (COVID-19)?  To be considered HIGH RISK for Coronavirus (COVID-19), you have to meet the following criteria:  . Traveled to [place], [place], [place], [place] or [place]; or in the [place] to [place], [place], [place], or [place]; and have fever, cough, and shortness of breath within the last 2 weeks of travel OR . Been in close contact with a person diagnosed with COVID-19 within the last 2 weeks and have fever, cough, and shortness of breath . IF YOU DO NOT MEET THESE CRITERIA, YOU ARE CONSIDERED LOW RISK FOR COVID-19.  What to do if you are HIGH RISK for COVID-19?  Marland Kitchen If you are having a medical emergency, call 911. . Seek medical care right away. Before you go to a doctor's office, urgent care or emergency department, call ahead and tell them about your  recent travel, contact with someone diagnosed with COVID-19, and your symptoms. You should receive instructions from your physician's office regarding next steps of care.  . When you arrive at healthcare provider, tell the healthcare staff immediately you have returned from visiting [place], [place], [place], [place] or [place]; or traveled in the [place] to [place], [place], [place], or [place]; in the last two weeks or you have been in close contact with a person diagnosed with COVID-19 in the last 2 weeks.   . Tell the health care staff about your symptoms: fever, cough and shortness of breath. . After you have been seen by a medical provider, you will be either: o Tested for (COVID-19) and discharged home on quarantine except to seek medical care if symptoms worsen, and asked to  - Stay home and avoid contact with others until you get your results (4-5 days)  - Avoid travel on public transportation if possible (such as bus, train, or airplane) or o Sent to the Emergency Department by EMS for evaluation, COVID-19 testing, and possible admission depending on your condition and test results.  What to do if you are LOW RISK for COVID-19?  Reduce your risk of any infection by using the same precautions used for avoiding the common cold or flu:  Marland Kitchen Wash your hands often with soap and warm water for at least 20 seconds.  If soap and water are not readily available, use an alcohol-based hand sanitizer with at least 60% alcohol.  . If  coughing or sneezing, cover your mouth and nose by coughing or sneezing into the elbow areas of your shirt or coat, into a tissue or into your sleeve (not your hands). . Avoid shaking hands with others and consider head nods or verbal greetings only. . Avoid touching your eyes, nose, or mouth with unwashed hands.  . Avoid close contact with people who are sick. . Avoid places or events with large numbers of people in one location, like concerts or sporting  events. . Carefully consider travel plans you have or are making. . If you are planning any travel outside or inside the [place], visit the CDC's Travelers' Health webpage for the latest health notices. . If you have some symptoms but not all symptoms, continue to monitor at home and seek medical attention if your symptoms worsen. . If you are having a medical emergency, call 911.   [place] / e-Visit: [email or web address]         [facility]: [facility]: [phone]                   [facility]: [phone]

## 2019-05-18 ENCOUNTER — Telehealth: Payer: Self-pay | Admitting: Physician Assistant

## 2019-05-18 NOTE — Telephone Encounter (Signed)
Scheduled appt per 7/02 los - added additional cycles - pt to get an updated schedule next visit.

## 2019-05-28 ENCOUNTER — Inpatient Hospital Stay: Payer: Medicare Other | Admitting: Physician Assistant

## 2019-05-28 ENCOUNTER — Inpatient Hospital Stay: Payer: Medicare Other

## 2019-05-28 ENCOUNTER — Other Ambulatory Visit: Payer: Self-pay

## 2019-05-28 VITALS — BP 117/76 | HR 88 | Temp 98.7°F | Resp 18 | Ht 73.0 in | Wt 319.8 lb

## 2019-05-28 DIAGNOSIS — M545 Low back pain: Secondary | ICD-10-CM | POA: Diagnosis not present

## 2019-05-28 DIAGNOSIS — C3491 Malignant neoplasm of unspecified part of right bronchus or lung: Secondary | ICD-10-CM

## 2019-05-28 DIAGNOSIS — Z95828 Presence of other vascular implants and grafts: Secondary | ICD-10-CM

## 2019-05-28 DIAGNOSIS — Z5112 Encounter for antineoplastic immunotherapy: Secondary | ICD-10-CM | POA: Diagnosis not present

## 2019-05-28 DIAGNOSIS — R0609 Other forms of dyspnea: Secondary | ICD-10-CM

## 2019-05-28 DIAGNOSIS — G8929 Other chronic pain: Secondary | ICD-10-CM

## 2019-05-28 DIAGNOSIS — C3411 Malignant neoplasm of upper lobe, right bronchus or lung: Secondary | ICD-10-CM | POA: Diagnosis not present

## 2019-05-28 DIAGNOSIS — Z79899 Other long term (current) drug therapy: Secondary | ICD-10-CM | POA: Diagnosis not present

## 2019-05-28 LAB — CBC WITH DIFFERENTIAL (CANCER CENTER ONLY)
Abs Immature Granulocytes: 0.03 10*3/uL (ref 0.00–0.07)
Basophils Absolute: 0 10*3/uL (ref 0.0–0.1)
Basophils Relative: 1 %
Eosinophils Absolute: 0.3 10*3/uL (ref 0.0–0.5)
Eosinophils Relative: 4 %
HCT: 44.1 % (ref 39.0–52.0)
Hemoglobin: 13.9 g/dL (ref 13.0–17.0)
Immature Granulocytes: 0 %
Lymphocytes Relative: 17 %
Lymphs Abs: 1.2 10*3/uL (ref 0.7–4.0)
MCH: 28.3 pg (ref 26.0–34.0)
MCHC: 31.5 g/dL (ref 30.0–36.0)
MCV: 89.8 fL (ref 80.0–100.0)
Monocytes Absolute: 0.9 10*3/uL (ref 0.1–1.0)
Monocytes Relative: 11 %
Neutro Abs: 5 10*3/uL (ref 1.7–7.7)
Neutrophils Relative %: 67 %
Platelet Count: 300 10*3/uL (ref 150–400)
RBC: 4.91 MIL/uL (ref 4.22–5.81)
RDW: 14.2 % (ref 11.5–15.5)
WBC Count: 7.5 10*3/uL (ref 4.0–10.5)
nRBC: 0 % (ref 0.0–0.2)

## 2019-05-28 LAB — CMP (CANCER CENTER ONLY)
ALT: 28 U/L (ref 0–44)
AST: 24 U/L (ref 15–41)
Albumin: 3.8 g/dL (ref 3.5–5.0)
Alkaline Phosphatase: 113 U/L (ref 38–126)
Anion gap: 12 (ref 5–15)
BUN: 12 mg/dL (ref 8–23)
CO2: 26 mmol/L (ref 22–32)
Calcium: 9.2 mg/dL (ref 8.9–10.3)
Chloride: 101 mmol/L (ref 98–111)
Creatinine: 0.93 mg/dL (ref 0.61–1.24)
GFR, Est AFR Am: >60 mL/min (ref >60)
GFR, Estimated: >60 mL/min (ref >60)
Glucose, Bld: 98 mg/dL (ref 70–99)
Potassium: 4.4 mmol/L (ref 3.5–5.1)
Sodium: 139 mmol/L (ref 135–145)
Total Bilirubin: 0.3 mg/dL (ref 0.3–1.2)
Total Protein: 7.6 g/dL (ref 6.5–8.1)

## 2019-05-28 MED ORDER — SODIUM CHLORIDE 0.9% FLUSH
10.0000 mL | Freq: Once | INTRAVENOUS | Status: AC
  Administered 2019-05-28: 10 mL
  Filled 2019-05-28: qty 10

## 2019-05-28 MED ORDER — SODIUM CHLORIDE 0.9 % IV SOLN
10.6000 mg/kg | Freq: Once | INTRAVENOUS | Status: AC
  Administered 2019-05-28: 1500 mg via INTRAVENOUS
  Filled 2019-05-28: qty 30

## 2019-05-28 MED ORDER — SODIUM CHLORIDE 0.9% FLUSH
10.0000 mL | INTRAVENOUS | Status: DC | PRN
  Administered 2019-05-28: 10 mL
  Filled 2019-05-28: qty 10

## 2019-05-28 MED ORDER — HEPARIN SOD (PORK) LOCK FLUSH 100 UNIT/ML IV SOLN
500.0000 [IU] | Freq: Once | INTRAVENOUS | Status: AC | PRN
  Administered 2019-05-28: 500 [IU]
  Filled 2019-05-28: qty 5

## 2019-05-28 MED ORDER — SODIUM CHLORIDE 0.9 % IV SOLN
Freq: Once | INTRAVENOUS | Status: AC
  Administered 2019-05-28: 11:00:00 via INTRAVENOUS
  Filled 2019-05-28: qty 250

## 2019-05-28 NOTE — Progress Notes (Signed)
[facility] OFFICE PROGRESS NOTE  [name], MD [address] [place] [address]  DIAGNOSIS: Stage IIIA(T2b, N2, M0) non-small cell lung cancer, squamous cell carcinoma presented with right upper lobe lung mass in addition to right paratracheal lymphadenopathy diagnosed in December 2019. The patient also has a hypermetabolic lesion in the left parotid gland that need further evaluation.  PRIOR THERAPY: Concurrent chemoradiation with weekly carboplatin for AUC of 2 and paclitaxel 45 mg/M2.First dose started on 12/02/2018.Status post 6 cycles. Last cycle was given on January 05, 2019 the patient is receiving radiation closer to home in [place], [place] completed January 12, 2019.  CURRENT THERAPY: Consolidation treatment with immunotherapy with Imfinzi 10 mg/KG IV every 2 weeks. First dose was given February 05, 2019. Status post 8cycles.  INTERVAL HISTORY: Alexander Hatfield. 63 y.o. male returns to the clinic for a follow up visit. The patient is feeling well without any concerning complaints except for chronic back pain secondary to a bad accident when he was 63 years old. He has had multiple surgeries. His surgeon recently retired and he is trying to see another Psychologist, sport and exercise for his continued pain. He is tolerating his treatment well without any adverse effects. He denies any fevers, chills, or night sweats. The patient is currently trying to lose weight by using a stationary bike. He endorses baseline shortness of breath with exertion. He denies any chest pain, cough, or hemoptysis. He denies nausea, vomiting, diarrhea, or constipation. He denies headaches and visual visual changes. He denies any rashes or skin changes. He is here today for evaluation before starting cycle #9.   MEDICAL HISTORY: Past Medical History:  Diagnosis Date  . Arthritis   . Chronic back pain   . Chronic back pain   . Headache   . HOH (hard of hearing)   . Hypertension   . Lung mass    . Pre-diabetes   . Sleep apnea    wears cpap  . Wears glasses   . Wears partial dentures     ALLERGIES:  is allergic to iohexol.  MEDICATIONS:  Current Outpatient Medications  Medication Sig Dispense Refill  . albuterol (PROVENTIL) (2.5 MG/3ML) 0.083% nebulizer solution VVN QID PRN    . amLODipine (NORVASC) 10 MG tablet Take 10 mg by mouth daily.     . baclofen (LIORESAL) 10 MG tablet Take 10 mg by mouth 3 (three) times daily.    . furosemide (LASIX) 20 MG tablet Take 20 mg by mouth daily.     Marland Kitchen gabapentin (NEURONTIN) 300 MG capsule Take 600 mg by mouth 3 (three) times daily. Pt states taking 3Tablets- 300mg  tablet TID    . guaifenesin (ROBITUSSIN) 100 MG/5ML syrup Take 200 mg by mouth 3 (three) times daily as needed for cough.    . losartan (COZAAR) 100 MG tablet Take 100 mg by mouth daily.     . magic mouthwash w/lidocaine SOLN Take by mouth.    . meloxicam (MOBIC) 15 MG tablet Take 15 mg by mouth daily.     Marland Kitchen oxycodone (ROXICODONE) 30 MG immediate release tablet One to two tablets every 8 hours as needed for pain.  Max 5 tab/day.  Must last 30 days.    . potassium chloride SA (K-DUR,KLOR-CON) 20 MEQ tablet Take 20 mEq by mouth every evening.     . prochlorperazine (COMPAZINE) 10 MG tablet Take 1 tablet (10 mg total) by mouth every 6 (six) hours as needed for nausea or vomiting. 30 tablet  0  . Umeclidinium-Vilanterol (ANORO ELLIPTA IN) Inhale into the lungs.    . lidocaine-prilocaine (EMLA) cream Apply 1 application topically as needed. (Patient not taking: Reported on 05/28/2019) 30 g 0   No current facility-administered medications for this visit.     SURGICAL HISTORY:  Past Surgical History:  Procedure Laterality Date  . ANTERIOR CERVICAL DECOMP/DISCECTOMY FUSION N/A 10/27/2018   Procedure: ANTERIOR CERVICAL THREE-FOUR, FOUR-FIVE DECOMPRESSION/DISCECTOMY FUSION TWO LEVELS;  Surgeon: Kary Kos, MD;  Location: [facility];  Service: Neurosurgery;  Laterality: N/A;   ANTERIOR CERVICAL  THREE-FOUR, FOUR-FIVE DECOMPRESSION/DISCECTOMY FUSION TWO LEVELS  . BACK SURGERY    . IR IMAGING GUIDED PORT INSERTION  11/27/2018  . MULTIPLE TOOTH EXTRACTIONS    . SPINAL CORD STIMULATOR IMPLANT    . VIDEO BRONCHOSCOPY N/A 10/29/2018   Procedure: VIDEO BRONCHOSCOPY;  Surgeon: Laurin Coder, MD;  Location: [facility];  Service: Thoracic;  Laterality: N/A;  . VIDEO BRONCHOSCOPY WITH ENDOBRONCHIAL ULTRASOUND N/A 10/27/2018   Procedure: VIDEO BRONCHOSCOPY WITH ENDOBRONCHIAL ULTRASOUND;  Surgeon: Laurin Coder, MD;  Location: [facility];  Service: Thoracic;  Laterality: N/A;  . VIDEO BRONCHOSCOPY WITH ENDOBRONCHIAL ULTRASOUND N/A 10/29/2018   Procedure: VIDEO BRONCHOSCOPY WITH ENDOBRONCHIAL ULTRASOUND;  Surgeon: Laurin Coder, MD;  Location: [facility];  Service: Thoracic;  Laterality: N/A;    REVIEW OF SYSTEMS:   Review of Systems  Constitutional: Negative for appetite change, chills, fatigue, fever and unexpected weight change.  HENT:   Negative for mouth sores, nosebleeds, sore throat and trouble swallowing.   Eyes: Negative for eye problems and icterus.  Respiratory: Positive for shortness of breath with exertion. Negative for cough, hemoptysis, and wheezing.   Cardiovascular: Negative for chest pain and leg swelling.  Gastrointestinal: Negative for abdominal pain, constipation, diarrhea, nausea and vomiting.  Genitourinary: Negative for bladder incontinence, difficulty urinating, dysuria, frequency and hematuria.   Musculoskeletal: Positive for chronic back pain. Negative for gait problem, neck pain and neck stiffness.  Skin: Negative for itching and rash.  Neurological: Negative for dizziness, extremity weakness, gait problem, headaches, light-headedness and seizures.  Hematological: Negative for adenopathy. Does not bruise/bleed easily.  Psychiatric/Behavioral: Negative for confusion, depression and sleep disturbance. The patient is not nervous/anxious.     PHYSICAL EXAMINATION:   Blood pressure 117/76, pulse 88, temperature 98.7 F (37.1 C), temperature source Oral, resp. rate 18, height 6\' 1"  (1.854 m), weight (!) 319 lb 12.8 oz (145.1 kg), SpO2 95 %.  ECOG PERFORMANCE STATUS: 1 - Symptomatic but completely ambulatory  Physical Exam  Constitutional: Oriented to person, place, and time and well-developed, well-nourished, and in no distress.   HENT:  Head: Normocephalic and atraumatic.  Mouth/Throat: Oropharynx is clear and moist. No oropharyngeal exudate.  Eyes: Conjunctivae are normal. Right eye exhibits no discharge. Left eye exhibits no discharge. No scleral icterus.  Neck: Normal range of motion. Neck supple.  Cardiovascular: Normal rate, regular rhythm, normal heart sounds and intact distal pulses.   Pulmonary/Chest: Effort normal and breath sounds normal. No respiratory distress. No wheezes. No rales.  Abdominal: Soft. Bowel sounds are normal. Exhibits no distension and no mass. There is no tenderness.  Musculoskeletal: Normal range of motion. Exhibits no edema.  Lymphadenopathy:    No cervical adenopathy.  Neurological: Alert and oriented to person, place, and time. Exhibits normal muscle tone. Gait normal. Coordination normal.  Skin: Skin is warm and dry. No rash noted. Not diaphoretic. No erythema. No pallor.  Psychiatric: Mood, memory and judgment normal.  Vitals reviewed.  LABORATORY  DATA: Lab Results  Component Value Date   WBC 7.5 05/28/2019   HGB 13.9 05/28/2019   HCT 44.1 05/28/2019   MCV 89.8 05/28/2019   PLT 300 05/28/2019      Chemistry      Component Value Date/Time   NA 139 05/28/2019 1011   K 4.4 05/28/2019 1011   CL 101 05/28/2019 1011   CO2 26 05/28/2019 1011   BUN 12 05/28/2019 1011   CREATININE 0.93 05/28/2019 1011      Component Value Date/Time   CALCIUM 9.2 05/28/2019 1011   ALKPHOS 113 05/28/2019 1011   AST 24 05/28/2019 1011   ALT 28 05/28/2019 1011   BILITOT 0.3 05/28/2019 1011       RADIOGRAPHIC  STUDIES:  No results found.   ASSESSMENT/PLAN:  This is a very pleasant 63 year old Caucasian male with stage III non-small cell lung cancer, squamous cell carcinoma. He presented with a right upper lobe lung mass in addition to right paratracheal lymphadenopathy. He was diagnosed in December 2019.   The patient completed a course of concurrent chemoradiation with weekly carboplatin and paclitaxel.  He is status post 6 cycles.  He had a partial response to treatment.  The patient is currently undergoing consolidation immunotherapy with Imfinzi 10 mg/kg IV every 2 weeks.  He is status post 8 cycles.  He has been tolerating treatment well without any adverse side effects.  Labs were reviewed with the patient.  I recommend he proceed with cycle #9 today as scheduled.  I will see him back for a follow up visit in 2 weeks for evaluation before starting cycle #10.   The patient was advised to call immediately if he has any concerning symptoms in the interval. The patient voices understanding of current disease status and treatment options and is in agreement with the current care plan. All questions were answered. The patient knows to call the clinic with any problems, questions or concerns. We can certainly see the patient much sooner if necessary  No orders of the defined types were placed in this encounter.    Cassandra L Heilingoetter, PA-C 05/28/19

## 2019-05-28 NOTE — Patient Instructions (Signed)
[facility] Discharge Instructions for Patients Receiving Chemotherapy  Today you received the following  Immunotherapy: Imfinzi  To help prevent nausea and vomiting after your treatment, we encourage you to take your nausea medication as directed by your MD.   If you develop nausea and vomiting that is not controlled by your nausea medication, call the clinic.   BELOW ARE SYMPTOMS THAT SHOULD BE REPORTED IMMEDIATELY:  *FEVER GREATER THAN 100.5 F  *CHILLS WITH OR WITHOUT FEVER  NAUSEA AND VOMITING THAT IS NOT CONTROLLED WITH YOUR NAUSEA MEDICATION  *UNUSUAL SHORTNESS OF BREATH  *UNUSUAL BRUISING OR BLEEDING  TENDERNESS IN MOUTH AND THROAT WITH OR WITHOUT PRESENCE OF ULCERS  *URINARY PROBLEMS  *BOWEL PROBLEMS  UNUSUAL RASH Items with * indicate a potential emergency and should be followed up as soon as possible.  Feel free to call the clinic should you have any questions or concerns. The clinic phone number is [phone].  Please show the [facility] at check-in to the Emergency Department and triage nurse.  Coronavirus (COVID-19) Are you at risk?  Are you at risk for the Coronavirus (COVID-19)?  To be considered HIGH RISK for Coronavirus (COVID-19), you have to meet the following criteria:  . Traveled to [place], [place], [place], [place] or [place]; or in the [place] to [place], [place], [place], or [place]; and have fever, cough, and shortness of breath within the last 2 weeks of travel OR . Been in close contact with a person diagnosed with COVID-19 within the last 2 weeks and have fever, cough, and shortness of breath . IF YOU DO NOT MEET THESE CRITERIA, YOU ARE CONSIDERED LOW RISK FOR COVID-19.  What to do if you are HIGH RISK for COVID-19?  Marland Kitchen If you are having a medical emergency, call 911. . Seek medical care right away. Before you go to a doctor's office, urgent care or emergency department, call ahead and tell them about your  recent travel, contact with someone diagnosed with COVID-19, and your symptoms. You should receive instructions from your physician's office regarding next steps of care.  . When you arrive at healthcare provider, tell the healthcare staff immediately you have returned from visiting [place], [place], [place], [place] or [place]; or traveled in the [place] to [place], [place], [place], or [place]; in the last two weeks or you have been in close contact with a person diagnosed with COVID-19 in the last 2 weeks.   . Tell the health care staff about your symptoms: fever, cough and shortness of breath. . After you have been seen by a medical provider, you will be either: o Tested for (COVID-19) and discharged home on quarantine except to seek medical care if symptoms worsen, and asked to  - Stay home and avoid contact with others until you get your results (4-5 days)  - Avoid travel on public transportation if possible (such as bus, train, or airplane) or o Sent to the Emergency Department by EMS for evaluation, COVID-19 testing, and possible admission depending on your condition and test results.  What to do if you are LOW RISK for COVID-19?  Reduce your risk of any infection by using the same precautions used for avoiding the common cold or flu:  Marland Kitchen Wash your hands often with soap and warm water for at least 20 seconds.  If soap and water are not readily available, use an alcohol-based hand sanitizer with at least 60% alcohol.  . If  coughing or sneezing, cover your mouth and nose by coughing or sneezing into the elbow areas of your shirt or coat, into a tissue or into your sleeve (not your hands). . Avoid shaking hands with others and consider head nods or verbal greetings only. . Avoid touching your eyes, nose, or mouth with unwashed hands.  . Avoid close contact with people who are sick. . Avoid places or events with large numbers of people in one location, like concerts or sporting  events. . Carefully consider travel plans you have or are making. . If you are planning any travel outside or inside the [place], visit the CDC's Travelers' Health webpage for the latest health notices. . If you have some symptoms but not all symptoms, continue to monitor at home and seek medical attention if your symptoms worsen. . If you are having a medical emergency, call 911.   [place] / e-Visit: [email or web address]         [facility]: [facility]: [phone]                   [facility]: [phone]

## 2019-06-04 DIAGNOSIS — Z7409 Other reduced mobility: Secondary | ICD-10-CM | POA: Diagnosis not present

## 2019-06-04 DIAGNOSIS — Z923 Personal history of irradiation: Secondary | ICD-10-CM | POA: Diagnosis not present

## 2019-06-04 DIAGNOSIS — R0602 Shortness of breath: Secondary | ICD-10-CM | POA: Diagnosis not present

## 2019-06-04 DIAGNOSIS — I712 Thoracic aortic aneurysm, without rupture: Secondary | ICD-10-CM | POA: Diagnosis not present

## 2019-06-04 DIAGNOSIS — C3411 Malignant neoplasm of upper lobe, right bronchus or lung: Secondary | ICD-10-CM | POA: Diagnosis not present

## 2019-06-04 DIAGNOSIS — Z9225 Personal history of immunosupression therapy: Secondary | ICD-10-CM | POA: Diagnosis not present

## 2019-06-04 DIAGNOSIS — R05 Cough: Secondary | ICD-10-CM | POA: Diagnosis not present

## 2019-06-04 DIAGNOSIS — R5383 Other fatigue: Secondary | ICD-10-CM | POA: Diagnosis not present

## 2019-06-04 DIAGNOSIS — G629 Polyneuropathy, unspecified: Secondary | ICD-10-CM | POA: Diagnosis not present

## 2019-06-04 DIAGNOSIS — C3490 Malignant neoplasm of unspecified part of unspecified bronchus or lung: Secondary | ICD-10-CM | POA: Diagnosis not present

## 2019-06-04 DIAGNOSIS — Z9221 Personal history of antineoplastic chemotherapy: Secondary | ICD-10-CM | POA: Diagnosis not present

## 2019-06-04 DIAGNOSIS — K1379 Other lesions of oral mucosa: Secondary | ICD-10-CM | POA: Diagnosis not present

## 2019-06-04 DIAGNOSIS — E041 Nontoxic single thyroid nodule: Secondary | ICD-10-CM | POA: Diagnosis not present

## 2019-06-04 DIAGNOSIS — I700 Atherosclerosis of aorta: Secondary | ICD-10-CM | POA: Diagnosis not present

## 2019-06-08 DIAGNOSIS — J449 Chronic obstructive pulmonary disease, unspecified: Secondary | ICD-10-CM | POA: Diagnosis not present

## 2019-06-08 DIAGNOSIS — C3490 Malignant neoplasm of unspecified part of unspecified bronchus or lung: Secondary | ICD-10-CM | POA: Diagnosis not present

## 2019-06-08 DIAGNOSIS — I10 Essential (primary) hypertension: Secondary | ICD-10-CM | POA: Diagnosis not present

## 2019-06-11 ENCOUNTER — Inpatient Hospital Stay: Payer: Medicare Other

## 2019-06-11 ENCOUNTER — Inpatient Hospital Stay (HOSPITAL_BASED_OUTPATIENT_CLINIC_OR_DEPARTMENT_OTHER): Payer: Medicare Other | Admitting: Physician Assistant

## 2019-06-11 ENCOUNTER — Encounter: Payer: Self-pay | Admitting: Physician Assistant

## 2019-06-11 ENCOUNTER — Other Ambulatory Visit: Payer: Self-pay

## 2019-06-11 VITALS — HR 87

## 2019-06-11 VITALS — BP 129/84 | HR 102 | Temp 97.8°F | Resp 17 | Ht 73.0 in | Wt 322.1 lb

## 2019-06-11 DIAGNOSIS — C3411 Malignant neoplasm of upper lobe, right bronchus or lung: Secondary | ICD-10-CM | POA: Diagnosis not present

## 2019-06-11 DIAGNOSIS — G959 Disease of spinal cord, unspecified: Secondary | ICD-10-CM | POA: Diagnosis not present

## 2019-06-11 DIAGNOSIS — C3491 Malignant neoplasm of unspecified part of right bronchus or lung: Secondary | ICD-10-CM

## 2019-06-11 DIAGNOSIS — Z5112 Encounter for antineoplastic immunotherapy: Secondary | ICD-10-CM | POA: Diagnosis not present

## 2019-06-11 DIAGNOSIS — R0609 Other forms of dyspnea: Secondary | ICD-10-CM | POA: Diagnosis not present

## 2019-06-11 DIAGNOSIS — G620 Drug-induced polyneuropathy: Secondary | ICD-10-CM | POA: Diagnosis not present

## 2019-06-11 DIAGNOSIS — J449 Chronic obstructive pulmonary disease, unspecified: Secondary | ICD-10-CM

## 2019-06-11 DIAGNOSIS — R062 Wheezing: Secondary | ICD-10-CM

## 2019-06-11 DIAGNOSIS — Z79899 Other long term (current) drug therapy: Secondary | ICD-10-CM | POA: Diagnosis not present

## 2019-06-11 DIAGNOSIS — Z95828 Presence of other vascular implants and grafts: Secondary | ICD-10-CM

## 2019-06-11 LAB — CMP (CANCER CENTER ONLY)
ALT: 23 U/L (ref 0–44)
AST: 19 U/L (ref 15–41)
Albumin: 3.7 g/dL (ref 3.5–5.0)
Alkaline Phosphatase: 110 U/L (ref 38–126)
Anion gap: 7 (ref 5–15)
BUN: 12 mg/dL (ref 8–23)
CO2: 29 mmol/L (ref 22–32)
Calcium: 9.4 mg/dL (ref 8.9–10.3)
Chloride: 103 mmol/L (ref 98–111)
Creatinine: 0.9 mg/dL (ref 0.61–1.24)
GFR, Est AFR Am: >60 mL/min (ref >60)
GFR, Estimated: >60 mL/min (ref >60)
Glucose, Bld: 99 mg/dL (ref 70–99)
Potassium: 4.2 mmol/L (ref 3.5–5.1)
Sodium: 139 mmol/L (ref 135–145)
Total Bilirubin: 0.4 mg/dL (ref 0.3–1.2)
Total Protein: 7.3 g/dL (ref 6.5–8.1)

## 2019-06-11 LAB — TSH: TSH: 1.196 u[IU]/mL (ref 0.320–4.118)

## 2019-06-11 LAB — CBC WITH DIFFERENTIAL (CANCER CENTER ONLY)
Abs Immature Granulocytes: 0.03 10*3/uL (ref 0.00–0.07)
Basophils Absolute: 0.1 10*3/uL (ref 0.0–0.1)
Basophils Relative: 1 %
Eosinophils Absolute: 0.4 10*3/uL (ref 0.0–0.5)
Eosinophils Relative: 6 %
HCT: 43 % (ref 39.0–52.0)
Hemoglobin: 13.6 g/dL (ref 13.0–17.0)
Immature Granulocytes: 0 %
Lymphocytes Relative: 19 %
Lymphs Abs: 1.3 10*3/uL (ref 0.7–4.0)
MCH: 28.8 pg (ref 26.0–34.0)
MCHC: 31.6 g/dL (ref 30.0–36.0)
MCV: 90.9 fL (ref 80.0–100.0)
Monocytes Absolute: 0.9 10*3/uL (ref 0.1–1.0)
Monocytes Relative: 13 %
Neutro Abs: 4.2 10*3/uL (ref 1.7–7.7)
Neutrophils Relative %: 61 %
Platelet Count: 263 10*3/uL (ref 150–400)
RBC: 4.73 MIL/uL (ref 4.22–5.81)
RDW: 14.9 % (ref 11.5–15.5)
WBC Count: 6.9 10*3/uL (ref 4.0–10.5)
nRBC: 0 % (ref 0.0–0.2)

## 2019-06-11 MED ORDER — SODIUM CHLORIDE 0.9% FLUSH
10.0000 mL | Freq: Once | INTRAVENOUS | Status: AC
  Administered 2019-06-11: 10 mL
  Filled 2019-06-11: qty 10

## 2019-06-11 MED ORDER — SODIUM CHLORIDE 0.9 % IV SOLN
Freq: Once | INTRAVENOUS | Status: AC
  Administered 2019-06-11: 15:00:00 via INTRAVENOUS
  Filled 2019-06-11: qty 250

## 2019-06-11 MED ORDER — SODIUM CHLORIDE 0.9 % IV SOLN
10.6000 mg/kg | Freq: Once | INTRAVENOUS | Status: AC
  Administered 2019-06-11: 1500 mg via INTRAVENOUS
  Filled 2019-06-11: qty 30

## 2019-06-11 MED ORDER — SODIUM CHLORIDE 0.9% FLUSH
10.0000 mL | INTRAVENOUS | Status: DC | PRN
  Administered 2019-06-11: 10 mL
  Filled 2019-06-11: qty 10

## 2019-06-11 MED ORDER — HEPARIN SOD (PORK) LOCK FLUSH 100 UNIT/ML IV SOLN
500.0000 [IU] | Freq: Once | INTRAVENOUS | Status: AC | PRN
  Administered 2019-06-11: 500 [IU]
  Filled 2019-06-11: qty 5

## 2019-06-11 NOTE — Patient Instructions (Signed)
[facility] Discharge Instructions for Patients Receiving Chemotherapy  Today you received the following chemotherapy agents Durvalumab (IMFINZI).  To help prevent nausea and vomiting after your treatment, we encourage you to take your nausea medication as prescribed.   If you develop nausea and vomiting that is not controlled by your nausea medication, call the clinic.   BELOW ARE SYMPTOMS THAT SHOULD BE REPORTED IMMEDIATELY:  *FEVER GREATER THAN 100.5 F  *CHILLS WITH OR WITHOUT FEVER  NAUSEA AND VOMITING THAT IS NOT CONTROLLED WITH YOUR NAUSEA MEDICATION  *UNUSUAL SHORTNESS OF BREATH  *UNUSUAL BRUISING OR BLEEDING  TENDERNESS IN MOUTH AND THROAT WITH OR WITHOUT PRESENCE OF ULCERS  *URINARY PROBLEMS  *BOWEL PROBLEMS  UNUSUAL RASH Items with * indicate a potential emergency and should be followed up as soon as possible.  Feel free to call the clinic should you have any questions or concerns. The clinic phone number is [phone].  Please show the [facility] at check-in to the Emergency Department and triage nurse.  Coronavirus (COVID-19) Are you at risk?  Are you at risk for the Coronavirus (COVID-19)?  To be considered HIGH RISK for Coronavirus (COVID-19), you have to meet the following criteria:  . Traveled to [place], [place], [place], [place] or [place]; or in the [place] to [place], [place], [place], or [place]; and have fever, cough, and shortness of breath within the last 2 weeks of travel OR . Been in close contact with a person diagnosed with COVID-19 within the last 2 weeks and have fever, cough, and shortness of breath . IF YOU DO NOT MEET THESE CRITERIA, YOU ARE CONSIDERED LOW RISK FOR COVID-19.  What to do if you are HIGH RISK for COVID-19?  Marland Kitchen If you are having a medical emergency, call 911. . Seek medical care right away. Before you go to a doctor's office, urgent care or emergency department, call ahead and tell them  about your recent travel, contact with someone diagnosed with COVID-19, and your symptoms. You should receive instructions from your physician's office regarding next steps of care.  . When you arrive at healthcare provider, tell the healthcare staff immediately you have returned from visiting [place], [place], [place], [place] or [place]; or traveled in the [place] to [place], [place], [place], or [place]; in the last two weeks or you have been in close contact with a person diagnosed with COVID-19 in the last 2 weeks.   . Tell the health care staff about your symptoms: fever, cough and shortness of breath. . After you have been seen by a medical provider, you will be either: o Tested for (COVID-19) and discharged home on quarantine except to seek medical care if symptoms worsen, and asked to  - Stay home and avoid contact with others until you get your results (4-5 days)  - Avoid travel on public transportation if possible (such as bus, train, or airplane) or o Sent to the Emergency Department by EMS for evaluation, COVID-19 testing, and possible admission depending on your condition and test results.  What to do if you are LOW RISK for COVID-19?  Reduce your risk of any infection by using the same precautions used for avoiding the common cold or flu:  Marland Kitchen Wash your hands often with soap and warm water for at least 20 seconds.  If soap and water are not readily available, use an alcohol-based hand sanitizer with at least 60% alcohol.  . If coughing or  sneezing, cover your mouth and nose by coughing or sneezing into the elbow areas of your shirt or coat, into a tissue or into your sleeve (not your hands). . Avoid shaking hands with others and consider head nods or verbal greetings only. . Avoid touching your eyes, nose, or mouth with unwashed hands.  . Avoid close contact with people who are sick. . Avoid places or events with large numbers of people in one location, like concerts or  sporting events. . Carefully consider travel plans you have or are making. . If you are planning any travel outside or inside the [place], visit the CDC's Travelers' Health webpage for the latest health notices. . If you have some symptoms but not all symptoms, continue to monitor at home and seek medical attention if your symptoms worsen. . If you are having a medical emergency, call 911.   [place] / e-Visit: [email or web address]         [facility]: [facility]: [phone]                   [facility]: [phone] \
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
## 2019-06-25 ENCOUNTER — Inpatient Hospital Stay: Payer: Medicare Other | Attending: Internal Medicine

## 2019-06-25 ENCOUNTER — Inpatient Hospital Stay (HOSPITAL_BASED_OUTPATIENT_CLINIC_OR_DEPARTMENT_OTHER): Payer: Medicare Other | Admitting: Internal Medicine

## 2019-06-25 ENCOUNTER — Other Ambulatory Visit: Payer: Self-pay

## 2019-06-25 ENCOUNTER — Telehealth: Payer: Self-pay | Admitting: Internal Medicine

## 2019-06-25 ENCOUNTER — Inpatient Hospital Stay: Payer: Medicare Other

## 2019-06-25 ENCOUNTER — Encounter: Payer: Self-pay | Admitting: Internal Medicine

## 2019-06-25 VITALS — BP 127/71 | HR 91 | Temp 98.5°F | Resp 18 | Ht 73.0 in | Wt 322.5 lb

## 2019-06-25 DIAGNOSIS — Z79899 Other long term (current) drug therapy: Secondary | ICD-10-CM | POA: Insufficient documentation

## 2019-06-25 DIAGNOSIS — C3491 Malignant neoplasm of unspecified part of right bronchus or lung: Secondary | ICD-10-CM

## 2019-06-25 DIAGNOSIS — C3490 Malignant neoplasm of unspecified part of unspecified bronchus or lung: Secondary | ICD-10-CM

## 2019-06-25 DIAGNOSIS — C3411 Malignant neoplasm of upper lobe, right bronchus or lung: Secondary | ICD-10-CM | POA: Diagnosis present

## 2019-06-25 DIAGNOSIS — Z95828 Presence of other vascular implants and grafts: Secondary | ICD-10-CM

## 2019-06-25 DIAGNOSIS — I10 Essential (primary) hypertension: Secondary | ICD-10-CM

## 2019-06-25 DIAGNOSIS — Z5112 Encounter for antineoplastic immunotherapy: Secondary | ICD-10-CM | POA: Insufficient documentation

## 2019-06-25 LAB — CBC WITH DIFFERENTIAL (CANCER CENTER ONLY)
Abs Immature Granulocytes: 0.03 10*3/uL (ref 0.00–0.07)
Basophils Absolute: 0 10*3/uL (ref 0.0–0.1)
Basophils Relative: 1 %
Eosinophils Absolute: 0.4 10*3/uL (ref 0.0–0.5)
Eosinophils Relative: 6 %
HCT: 43.2 % (ref 39.0–52.0)
Hemoglobin: 13.6 g/dL (ref 13.0–17.0)
Immature Granulocytes: 0 %
Lymphocytes Relative: 20 %
Lymphs Abs: 1.4 10*3/uL (ref 0.7–4.0)
MCH: 28.3 pg (ref 26.0–34.0)
MCHC: 31.5 g/dL (ref 30.0–36.0)
MCV: 90 fL (ref 80.0–100.0)
Monocytes Absolute: 0.8 10*3/uL (ref 0.1–1.0)
Monocytes Relative: 12 %
Neutro Abs: 4.1 10*3/uL (ref 1.7–7.7)
Neutrophils Relative %: 61 %
Platelet Count: 291 10*3/uL (ref 150–400)
RBC: 4.8 MIL/uL (ref 4.22–5.81)
RDW: 15.8 % — ABNORMAL HIGH (ref 11.5–15.5)
WBC Count: 6.8 10*3/uL (ref 4.0–10.5)
nRBC: 0 % (ref 0.0–0.2)

## 2019-06-25 LAB — CMP (CANCER CENTER ONLY)
ALT: 28 U/L (ref 0–44)
AST: 23 U/L (ref 15–41)
Albumin: 3.9 g/dL (ref 3.5–5.0)
Alkaline Phosphatase: 110 U/L (ref 38–126)
Anion gap: 11 (ref 5–15)
BUN: 20 mg/dL (ref 8–23)
CO2: 25 mmol/L (ref 22–32)
Calcium: 9.3 mg/dL (ref 8.9–10.3)
Chloride: 102 mmol/L (ref 98–111)
Creatinine: 1.22 mg/dL (ref 0.61–1.24)
GFR, Est AFR Am: >60 mL/min (ref >60)
GFR, Estimated: >60 mL/min (ref >60)
Glucose, Bld: 122 mg/dL — ABNORMAL HIGH (ref 70–99)
Potassium: 4.5 mmol/L (ref 3.5–5.1)
Sodium: 138 mmol/L (ref 135–145)
Total Bilirubin: 0.6 mg/dL (ref 0.3–1.2)
Total Protein: 7.5 g/dL (ref 6.5–8.1)

## 2019-06-25 MED ORDER — HEPARIN SOD (PORK) LOCK FLUSH 100 UNIT/ML IV SOLN
500.0000 [IU] | Freq: Once | INTRAVENOUS | Status: AC | PRN
  Administered 2019-06-25: 500 [IU]
  Filled 2019-06-25: qty 5

## 2019-06-25 MED ORDER — SODIUM CHLORIDE 0.9% FLUSH
10.0000 mL | INTRAVENOUS | Status: DC | PRN
  Administered 2019-06-25: 10 mL
  Filled 2019-06-25: qty 10

## 2019-06-25 MED ORDER — SODIUM CHLORIDE 0.9 % IV SOLN
10.6000 mg/kg | Freq: Once | INTRAVENOUS | Status: AC
  Administered 2019-06-25: 1500 mg via INTRAVENOUS
  Filled 2019-06-25: qty 30

## 2019-06-25 MED ORDER — SODIUM CHLORIDE 0.9% FLUSH
10.0000 mL | Freq: Once | INTRAVENOUS | Status: AC
  Administered 2019-06-25: 10 mL
  Filled 2019-06-25: qty 10

## 2019-06-25 MED ORDER — SODIUM CHLORIDE 0.9 % IV SOLN
Freq: Once | INTRAVENOUS | Status: AC
  Administered 2019-06-25: 10:00:00 via INTRAVENOUS
  Filled 2019-06-25: qty 250

## 2019-06-25 NOTE — Patient Instructions (Signed)
[facility] Discharge Instructions for Patients Receiving Chemotherapy  Today you received the following chemotherapy agents Durvalumab (IMFINZI).  To help prevent nausea and vomiting after your treatment, we encourage you to take your nausea medication as prescribed.   If you develop nausea and vomiting that is not controlled by your nausea medication, call the clinic.   BELOW ARE SYMPTOMS THAT SHOULD BE REPORTED IMMEDIATELY:  *FEVER GREATER THAN 100.5 F  *CHILLS WITH OR WITHOUT FEVER  NAUSEA AND VOMITING THAT IS NOT CONTROLLED WITH YOUR NAUSEA MEDICATION  *UNUSUAL SHORTNESS OF BREATH  *UNUSUAL BRUISING OR BLEEDING  TENDERNESS IN MOUTH AND THROAT WITH OR WITHOUT PRESENCE OF ULCERS  *URINARY PROBLEMS  *BOWEL PROBLEMS  UNUSUAL RASH Items with * indicate a potential emergency and should be followed up as soon as possible.  Feel free to call the clinic should you have any questions or concerns. The clinic phone number is [phone].  Please show the [facility] at check-in to the Emergency Department and triage nurse.  Coronavirus (COVID-19) Are you at risk?  Are you at risk for the Coronavirus (COVID-19)?  To be considered HIGH RISK for Coronavirus (COVID-19), you have to meet the following criteria:  . Traveled to [place], [place], [place], [place] or [place]; or in the [place] to [place], [place], [place], or [place]; and have fever, cough, and shortness of breath within the last 2 weeks of travel OR . Been in close contact with a person diagnosed with COVID-19 within the last 2 weeks and have fever, cough, and shortness of breath . IF YOU DO NOT MEET THESE CRITERIA, YOU ARE CONSIDERED LOW RISK FOR COVID-19.  What to do if you are HIGH RISK for COVID-19?  Marland Kitchen If you are having a medical emergency, call 911. . Seek medical care right away. Before you go to a doctor's office, urgent care or emergency department, call ahead and tell them  about your recent travel, contact with someone diagnosed with COVID-19, and your symptoms. You should receive instructions from your physician's office regarding next steps of care.  . When you arrive at healthcare provider, tell the healthcare staff immediately you have returned from visiting [place], [place], [place], [place] or [place]; or traveled in the [place] to [place], [place], [place], or [place]; in the last two weeks or you have been in close contact with a person diagnosed with COVID-19 in the last 2 weeks.   . Tell the health care staff about your symptoms: fever, cough and shortness of breath. . After you have been seen by a medical provider, you will be either: o Tested for (COVID-19) and discharged home on quarantine except to seek medical care if symptoms worsen, and asked to  - Stay home and avoid contact with others until you get your results (4-5 days)  - Avoid travel on public transportation if possible (such as bus, train, or airplane) or o Sent to the Emergency Department by EMS for evaluation, COVID-19 testing, and possible admission depending on your condition and test results.  What to do if you are LOW RISK for COVID-19?  Reduce your risk of any infection by using the same precautions used for avoiding the common cold or flu:  Marland Kitchen Wash your hands often with soap and warm water for at least 20 seconds.  If soap and water are not readily available, use an alcohol-based hand sanitizer with at least 60% alcohol.  . If coughing or  sneezing, cover your mouth and nose by coughing or sneezing into the elbow areas of your shirt or coat, into a tissue or into your sleeve (not your hands). . Avoid shaking hands with others and consider head nods or verbal greetings only. . Avoid touching your eyes, nose, or mouth with unwashed hands.  . Avoid close contact with people who are sick. . Avoid places or events with large numbers of people in one location, like concerts or  sporting events. . Carefully consider travel plans you have or are making. . If you are planning any travel outside or inside the [place], visit the CDC's Travelers' Health webpage for the latest health notices. . If you have some symptoms but not all symptoms, continue to monitor at home and seek medical attention if your symptoms worsen. . If you are having a medical emergency, call 911.   [facility] / e-Visit: [email or web address]         [facility]: [facility]: [phone]                   [facility]: [phone]

## 2019-06-25 NOTE — Progress Notes (Signed)
[facility] Telephone:([phone]) [phone]   Fax:([phone]  OFFICE PROGRESS NOTE  [name], MD [place] [address]  DIAGNOSIS: stage IIIA (T2b, N2, M0) non-small cell lung cancer, squamous cell carcinoma presented with right upper lobe lung mass in addition to right paratracheal lymphadenopathy diagnosed in December 2019. The patient also has a hypermetabolic lesion in the left parotid gland that need further evaluation.  PRIOR THERAPY:  concurrent chemoradiation with weekly carboplatin for AUC of 2 and paclitaxel 45 mg/M2.  First dose started on 12/02/2018. Status post 6 cycles.  Last cycle was given on January 05, 2019 the patient is receiving radiation closer to home in [place], [place] completed January 12, 2019.   CURRENT THERAPY:  Consolidation treatment with immunotherapy with Imfinzi 10 mg/KG IV every 2 weeks.  First dose was given February 05, 2019. Status post 10 cycles.  INTERVAL HISTORY: Alexander Hatfield. 63 y.o. male returns to the clinic today for follow-up visit.  The patient came walking to the clinic today and not any wheelchair as he normally does.  He denied having any chest pain, shortness of breath except with exertion with no cough or hemoptysis.  He plays golf 3 times a week.  He denied having any fever or chills.  He has no nausea, vomiting, diarrhea or constipation.  He denied having any headache or visual changes.  The patient is here today for evaluation before starting cycle #11.   MEDICAL HISTORY: Past Medical History:  Diagnosis Date  . Arthritis   . Chronic back pain   . Chronic back pain   . Headache   . HOH (hard of hearing)   . Hypertension   . Lung mass   . Pre-diabetes   . Sleep apnea    wears cpap  . Wears glasses   . Wears partial dentures     ALLERGIES:  is allergic to iohexol.  MEDICATIONS:  Current Outpatient Medications  Medication Sig Dispense Refill  . albuterol (PROVENTIL) (2.5  MG/3ML) 0.083% nebulizer solution VVN QID PRN    . amLODipine (NORVASC) 10 MG tablet Take 10 mg by mouth daily.     . baclofen (LIORESAL) 10 MG tablet Take 10 mg by mouth 3 (three) times daily.    . furosemide (LASIX) 20 MG tablet Take 20 mg by mouth daily.     Marland Kitchen gabapentin (NEURONTIN) 300 MG capsule Take 600 mg by mouth 3 (three) times daily. Pt states taking 3Tablets- 300mg  tablet TID    . guaifenesin (ROBITUSSIN) 100 MG/5ML syrup Take 200 mg by mouth 3 (three) times daily as needed for cough.    . lidocaine-prilocaine (EMLA) cream Apply 1 application topically as needed. (Patient not taking: Reported on 05/28/2019) 30 g 0  . losartan (COZAAR) 100 MG tablet Take 100 mg by mouth daily.     . magic mouthwash w/lidocaine SOLN Take by mouth.    . meloxicam (MOBIC) 15 MG tablet Take 15 mg by mouth daily.     Marland Kitchen oxycodone (ROXICODONE) 30 MG immediate release tablet One to two tablets every 8 hours as needed for pain.  Max 5 tab/day.  Must last 30 days.    Marland Kitchen oxyCODONE ER (XTAMPZA ER) 27 MG C12A Take 1 tablet by mouth every 12 (twelve) hours. Oxycodone ER    . potassium chloride SA (K-DUR,KLOR-CON) 20 MEQ tablet Take 20 mEq by mouth every evening.     . prochlorperazine (COMPAZINE) 10 MG tablet Take  1 tablet (10 mg total) by mouth every 6 (six) hours as needed for nausea or vomiting. (Patient not taking: Reported on 06/11/2019) 30 tablet 0  . Umeclidinium-Vilanterol (ANORO ELLIPTA IN) Inhale into the lungs.     No current facility-administered medications for this visit.     SURGICAL HISTORY:  Past Surgical History:  Procedure Laterality Date  . ANTERIOR CERVICAL DECOMP/DISCECTOMY FUSION N/A 10/27/2018   Procedure: ANTERIOR CERVICAL THREE-FOUR, FOUR-FIVE DECOMPRESSION/DISCECTOMY FUSION TWO LEVELS;  Surgeon: Kary Kos, MD;  Location: [facility];  Service: Neurosurgery;  Laterality: N/A;   ANTERIOR CERVICAL THREE-FOUR, FOUR-FIVE DECOMPRESSION/DISCECTOMY FUSION TWO LEVELS  . BACK SURGERY    . IR IMAGING  GUIDED PORT INSERTION  11/27/2018  . MULTIPLE TOOTH EXTRACTIONS    . SPINAL CORD STIMULATOR IMPLANT    . VIDEO BRONCHOSCOPY N/A 10/29/2018   Procedure: VIDEO BRONCHOSCOPY;  Surgeon: Laurin Coder, MD;  Location: [facility];  Service: Thoracic;  Laterality: N/A;  . VIDEO BRONCHOSCOPY WITH ENDOBRONCHIAL ULTRASOUND N/A 10/27/2018   Procedure: VIDEO BRONCHOSCOPY WITH ENDOBRONCHIAL ULTRASOUND;  Surgeon: Laurin Coder, MD;  Location: [facility];  Service: Thoracic;  Laterality: N/A;  . VIDEO BRONCHOSCOPY WITH ENDOBRONCHIAL ULTRASOUND N/A 10/29/2018   Procedure: VIDEO BRONCHOSCOPY WITH ENDOBRONCHIAL ULTRASOUND;  Surgeon: Laurin Coder, MD;  [facility];  Service: Thoracic;  Laterality: N/A;    REVIEW OF SYSTEMS:  A comprehensive review of systems was negative except for: Respiratory: positive for dyspnea on exertion   PHYSICAL EXAMINATION: General appearance: alert, cooperative and no distress Head: Normocephalic, without obvious abnormality, atraumatic Neck: no adenopathy, no JVD, supple, symmetrical, trachea midline and thyroid not enlarged, symmetric, no tenderness/mass/nodules Lymph nodes: Cervical, supraclavicular, and axillary nodes normal. Resp: clear to auscultation bilaterally Back: symmetric, no curvature. ROM normal. No CVA tenderness. Cardio: regular rate and rhythm, S1, S2 normal, no murmur, click, rub or gallop GI: soft, non-tender; bowel sounds normal; no masses,  no organomegaly Extremities: extremities normal, atraumatic, no cyanosis or edema  ECOG PERFORMANCE STATUS: 1 - Symptomatic but completely ambulatory  Blood pressure 127/71, pulse 91, temperature 98.5 F (36.9 C), temperature source Oral, resp. rate 18, height 6\' 1"  (1.854 m), weight (!) 322 lb 8 oz (146.3 kg), SpO2 94 %.  LABORATORY DATA: Lab Results  Component Value Date   WBC 6.8 06/25/2019   HGB 13.6 06/25/2019   HCT 43.2 06/25/2019   MCV 90.0 06/25/2019   PLT 291 06/25/2019      Chemistry       Component Value Date/Time   NA 138 06/25/2019 0814   K 4.5 06/25/2019 0814   CL 102 06/25/2019 0814   CO2 25 06/25/2019 0814   BUN 20 06/25/2019 0814   CREATININE 1.22 06/25/2019 0814      Component Value Date/Time   CALCIUM 9.3 06/25/2019 0814   ALKPHOS 110 06/25/2019 0814   AST 23 06/25/2019 0814   ALT 28 06/25/2019 0814   BILITOT 0.6 06/25/2019 0814       RADIOGRAPHIC STUDIES: No results found.  ASSESSMENT AND PLAN: This is a very pleasant 63 years old white male with a stage IIIa non-small cell lung cancer, squamous cell carcinoma diagnosed in December 2019. The patient completed a course of treatment with concurrent chemoradiation with weekly carboplatin and paclitaxel status post 6 cycles.  He had partial response to this treatment. He was a started on consolidation treatment with Imfinzi status post 10 cycles. The patient continues to tolerate this treatment well with no concerning adverse effects. I recommended  for him to proceed with cycle #11 today as planned. I will see him back for follow-up visit in 2 weeks for evaluation before starting cycle #12. He will have repeat CT scan of the chest in around 4 weeks. He was advised to call immediately if he has any concerning symptoms in the interval. The patient voices understanding of current disease status and treatment options and is in agreement with the current care plan. All questions were answered. The patient knows to call the clinic with any problems, questions or concerns. We can certainly see the patient much sooner if necessary.  Disclaimer: This note was dictated with voice recognition software. Similar sounding words can inadvertently be transcribed and may not be corrected upon review.

## 2019-06-25 NOTE — Telephone Encounter (Signed)
Added additional cycles per 8/13 los - pt to get an updated schedule next visit.

## 2019-07-01 MED ORDER — DIPHENHYDRAMINE HCL 25 MG PO CAPS
ORAL_CAPSULE | ORAL | Status: AC
  Filled 2019-07-01: qty 1

## 2019-07-01 MED ORDER — ACETAMINOPHEN 325 MG PO TABS
ORAL_TABLET | ORAL | Status: AC
  Filled 2019-07-01: qty 2

## 2019-07-09 ENCOUNTER — Other Ambulatory Visit: Payer: Self-pay

## 2019-07-09 ENCOUNTER — Inpatient Hospital Stay: Payer: Medicare Other

## 2019-07-09 ENCOUNTER — Inpatient Hospital Stay (HOSPITAL_BASED_OUTPATIENT_CLINIC_OR_DEPARTMENT_OTHER): Payer: Medicare Other | Admitting: Physician Assistant

## 2019-07-09 VITALS — BP 134/72 | HR 93 | Temp 98.9°F | Resp 18 | Ht 73.0 in | Wt 322.5 lb

## 2019-07-09 DIAGNOSIS — Z5112 Encounter for antineoplastic immunotherapy: Secondary | ICD-10-CM | POA: Diagnosis not present

## 2019-07-09 DIAGNOSIS — Z95828 Presence of other vascular implants and grafts: Secondary | ICD-10-CM

## 2019-07-09 DIAGNOSIS — C3491 Malignant neoplasm of unspecified part of right bronchus or lung: Secondary | ICD-10-CM

## 2019-07-09 LAB — CBC WITH DIFFERENTIAL (CANCER CENTER ONLY)
Abs Immature Granulocytes: 0.03 10*3/uL (ref 0.00–0.07)
Basophils Absolute: 0.1 10*3/uL (ref 0.0–0.1)
Basophils Relative: 1 %
Eosinophils Absolute: 0.4 10*3/uL (ref 0.0–0.5)
Eosinophils Relative: 5 %
HCT: 43.5 % (ref 39.0–52.0)
Hemoglobin: 13.8 g/dL (ref 13.0–17.0)
Immature Granulocytes: 0 %
Lymphocytes Relative: 19 %
Lymphs Abs: 1.5 10*3/uL (ref 0.7–4.0)
MCH: 28.5 pg (ref 26.0–34.0)
MCHC: 31.7 g/dL (ref 30.0–36.0)
MCV: 89.7 fL (ref 80.0–100.0)
Monocytes Absolute: 0.7 10*3/uL (ref 0.1–1.0)
Monocytes Relative: 10 %
Neutro Abs: 5.1 10*3/uL (ref 1.7–7.7)
Neutrophils Relative %: 65 %
Platelet Count: 268 10*3/uL (ref 150–400)
RBC: 4.85 MIL/uL (ref 4.22–5.81)
RDW: 16.1 % — ABNORMAL HIGH (ref 11.5–15.5)
WBC Count: 7.8 10*3/uL (ref 4.0–10.5)
nRBC: 0 % (ref 0.0–0.2)

## 2019-07-09 LAB — CMP (CANCER CENTER ONLY)
ALT: 21 U/L (ref 0–44)
AST: 16 U/L (ref 15–41)
Albumin: 4 g/dL (ref 3.5–5.0)
Alkaline Phosphatase: 106 U/L (ref 38–126)
Anion gap: 9 (ref 5–15)
BUN: 11 mg/dL (ref 8–23)
CO2: 26 mmol/L (ref 22–32)
Calcium: 9.3 mg/dL (ref 8.9–10.3)
Chloride: 105 mmol/L (ref 98–111)
Creatinine: 0.92 mg/dL (ref 0.61–1.24)
GFR, Est AFR Am: >60 mL/min (ref >60)
GFR, Estimated: >60 mL/min (ref >60)
Glucose, Bld: 100 mg/dL — ABNORMAL HIGH (ref 70–99)
Potassium: 4.4 mmol/L (ref 3.5–5.1)
Sodium: 140 mmol/L (ref 135–145)
Total Bilirubin: 0.5 mg/dL (ref 0.3–1.2)
Total Protein: 7.5 g/dL (ref 6.5–8.1)

## 2019-07-09 LAB — TSH: TSH: 0.649 u[IU]/mL (ref 0.320–4.118)

## 2019-07-09 MED ORDER — SODIUM CHLORIDE 0.9% FLUSH
10.0000 mL | Freq: Once | INTRAVENOUS | Status: AC
  Administered 2019-07-09: 10 mL
  Filled 2019-07-09: qty 10

## 2019-07-09 MED ORDER — HEPARIN SOD (PORK) LOCK FLUSH 100 UNIT/ML IV SOLN
500.0000 [IU] | Freq: Once | INTRAVENOUS | Status: AC | PRN
  Administered 2019-07-09: 500 [IU]
  Filled 2019-07-09: qty 5

## 2019-07-09 MED ORDER — SODIUM CHLORIDE 0.9% FLUSH
10.0000 mL | INTRAVENOUS | Status: DC | PRN
  Administered 2019-07-09: 10 mL
  Filled 2019-07-09: qty 10

## 2019-07-09 MED ORDER — SODIUM CHLORIDE 0.9 % IV SOLN
Freq: Once | INTRAVENOUS | Status: AC
  Administered 2019-07-09: 11:00:00 via INTRAVENOUS
  Filled 2019-07-09: qty 250

## 2019-07-09 MED ORDER — SODIUM CHLORIDE 0.9 % IV SOLN
10.6000 mg/kg | Freq: Once | INTRAVENOUS | Status: AC
  Administered 2019-07-09: 1500 mg via INTRAVENOUS
  Filled 2019-07-09: qty 30

## 2019-07-09 NOTE — Progress Notes (Signed)
[facility] OFFICE PROGRESS NOTE  [name], MD [address] [place] [address]  DIAGNOSIS: Stage IIIA(T2b, N2, M0) non-small cell lung cancer, squamous cell carcinoma presented with right upper lobe lung mass in addition to right paratracheal lymphadenopathy diagnosed in December 2019. The patient also has a hypermetabolic lesion in the left parotid gland that need further evaluation.  PRIOR THERAPY: Concurrent chemoradiation with weekly carboplatin for AUC of 2 and paclitaxel 45 mg/M2.First dose started on 12/02/2018.Status post 6 cycles. Last cycle was given on January 05, 2019 the patient is receiving radiation closer to home in [place], [place] completed January 12, 2019.  CURRENT THERAPY: Consolidation treatment with immunotherapy with Imfinzi 10 mg/KG IV every 2 weeks. First dose was given February 05, 2019. Status post11cycles.  INTERVAL HISTORY: Alexander Hatfield. 63 y.o. male returns to the clinic for a follow up visit. The patient is feeling well today without any concerning complaints except for his baseline shortness of breath with exertion. The patient is still able to perform hobbies/activities such as gold regularly and fish. The patient continues to tolerate treatment with Imfinzi well without any adverse effects. Denies any fever, chills, night sweats, or weight loss. Denies any chest pain or hemoptysis. The patient has a mild dry cough at night when he is laying in his recliner. He denies any reflux and has not tried any interventions for his cough yet. He denies any sore throat or nasal congestion. Denies any nausea, vomiting, diarrhea, or constipation. Denies any headache or visual changes. Denies any rashes or skin changes. The patient is here today for evaluation prior to starting cycle #12  MEDICAL HISTORY: Past Medical History:  Diagnosis Date  . Arthritis   . Chronic back pain   . Chronic back pain   . Headache   . HOH (hard of  hearing)   . Hypertension   . Lung mass   . Pre-diabetes   . Sleep apnea    wears cpap  . Wears glasses   . Wears partial dentures     ALLERGIES:  is allergic to iohexol.  MEDICATIONS:  Current Outpatient Medications  Medication Sig Dispense Refill  . albuterol (PROVENTIL) (2.5 MG/3ML) 0.083% nebulizer solution VVN QID PRN    . amLODipine (NORVASC) 10 MG tablet Take 10 mg by mouth daily.     . baclofen (LIORESAL) 10 MG tablet Take 10 mg by mouth 3 (three) times daily.    . furosemide (LASIX) 20 MG tablet Take 20 mg by mouth daily.     Marland Kitchen gabapentin (NEURONTIN) 300 MG capsule Take 600 mg by mouth 3 (three) times daily. Pt states taking 3Tablets- 300mg  tablet TID    . guaifenesin (ROBITUSSIN) 100 MG/5ML syrup Take 200 mg by mouth 3 (three) times daily as needed for cough.    . losartan (COZAAR) 100 MG tablet Take 100 mg by mouth daily.     . magic mouthwash w/lidocaine SOLN Take by mouth.    . meloxicam (MOBIC) 15 MG tablet Take 15 mg by mouth daily.     Marland Kitchen oxycodone (ROXICODONE) 30 MG immediate release tablet One to two tablets every 8 hours as needed for pain.  Max 5 tab/day.  Must last 30 days.    Marland Kitchen oxyCODONE ER (XTAMPZA ER) 27 MG C12A Take 1 tablet by mouth every 12 (twelve) hours. Oxycodone ER    . potassium chloride SA (K-DUR,KLOR-CON) 20 MEQ tablet Take 20 mEq by mouth every evening.     Marland Kitchen  Umeclidinium-Vilanterol (ANORO ELLIPTA IN) Inhale into the lungs.    . lidocaine-prilocaine (EMLA) cream Apply 1 application topically as needed. (Patient not taking: Reported on 05/28/2019) 30 g 0  . prochlorperazine (COMPAZINE) 10 MG tablet Take 1 tablet (10 mg total) by mouth every 6 (six) hours as needed for nausea or vomiting. (Patient not taking: Reported on 06/11/2019) 30 tablet 0   No current facility-administered medications for this visit.    Facility-Administered Medications Ordered in Other Visits  Medication Dose Route Frequency Provider Last Rate Last Dose  . durvalumab (IMFINZI)  1,500 mg in sodium chloride 0.9 % 100 mL chemo infusion  10.6 mg/kg (Treatment Plan Recorded) Intravenous Once Curt Bears, MD      . heparin lock flush 100 unit/mL  500 Units Intracatheter Once PRN Curt Bears, MD      . sodium chloride flush (NS) 0.9 % injection 10 mL  10 mL Intracatheter PRN Curt Bears, MD        SURGICAL HISTORY:  Past Surgical History:  Procedure Laterality Date  . ANTERIOR CERVICAL DECOMP/DISCECTOMY FUSION N/A 10/27/2018   Procedure: ANTERIOR CERVICAL THREE-FOUR, FOUR-FIVE DECOMPRESSION/DISCECTOMY FUSION TWO LEVELS;  Surgeon: Kary Kos, MD;  Location: [facility];  Service: Neurosurgery;  Laterality: N/A;   ANTERIOR CERVICAL THREE-FOUR, FOUR-FIVE DECOMPRESSION/DISCECTOMY FUSION TWO LEVELS  . BACK SURGERY    . IR IMAGING GUIDED PORT INSERTION  11/27/2018  . MULTIPLE TOOTH EXTRACTIONS    . SPINAL CORD STIMULATOR IMPLANT    . VIDEO BRONCHOSCOPY N/A 10/29/2018   Procedure: VIDEO BRONCHOSCOPY;  Surgeon: Laurin Coder, MD;  [facility];  Service: Thoracic;  Laterality: N/A;  . VIDEO BRONCHOSCOPY WITH ENDOBRONCHIAL ULTRASOUND N/A 10/27/2018   Procedure: VIDEO BRONCHOSCOPY WITH ENDOBRONCHIAL ULTRASOUND;  Surgeon: Laurin Coder, MD;  Location: [facility];  Service: Thoracic;  Laterality: N/A;  . VIDEO BRONCHOSCOPY WITH ENDOBRONCHIAL ULTRASOUND N/A 10/29/2018   Procedure: VIDEO BRONCHOSCOPY WITH ENDOBRONCHIAL ULTRASOUND;  Surgeon: Laurin Coder, MD;  [facility];  Service: Thoracic;  Laterality: N/A;    REVIEW OF SYSTEMS:   Review of Systems  Constitutional: Negative for appetite change, chills, fatigue, fever and unexpected weight change.  HENT:   Negative for mouth sores, nosebleeds, sore throat and trouble swallowing.   Eyes: Negative for eye problems and icterus.  Respiratory: Positive for mild dry cough and shortness of breath with exertion. Negative for hemoptysis and wheezing.   Cardiovascular: Negative for chest pain and leg swelling.   Gastrointestinal: Negative for abdominal pain, constipation, diarrhea, nausea and vomiting.  Genitourinary: Negative for bladder incontinence, difficulty urinating, dysuria, frequency and hematuria.   Musculoskeletal: Positive for chronic back pain. Negative for gait problem, neck pain and neck stiffness.  Skin: Negative for itching and rash.  Neurological: Negative for dizziness, extremity weakness, gait problem, headaches, light-headedness and seizures.  Hematological: Negative for adenopathy. Does not bruise/bleed easily.  Psychiatric/Behavioral: Negative for confusion, depression and sleep disturbance. The patient is not nervous/anxious.     PHYSICAL EXAMINATION:  Blood pressure 134/72, pulse 93, temperature 98.9 F (37.2 C), temperature source Oral, resp. rate 18, height 6\' 1"  (1.854 m), weight (!) 322 lb 8 oz (146.3 kg), SpO2 94 %.  ECOG PERFORMANCE STATUS: 1 - Symptomatic but completely ambulatory  Physical Exam  Constitutional: Oriented to person, place, and time and well-developed, well-nourished, and in no distress.  HENT:  Head: Normocephalic and atraumatic.  Mouth/Throat: Oropharynx is clear and moist. No oropharyngeal exudate.  Eyes: Conjunctivae are normal. Right eye exhibits no discharge. Left  eye exhibits no discharge. No scleral icterus.  Neck: Normal range of motion. Neck supple.  Cardiovascular: Normal rate, regular rhythm, normal heart sounds and intact distal pulses.   Pulmonary/Chest: Mild wheezing bilaterally. Effort normal. No respiratory distress. No rales.  Abdominal: Soft. Bowel sounds are normal. Exhibits no distension and no mass. There is no tenderness.  Musculoskeletal: Normal range of motion. Exhibits no edema.  Lymphadenopathy:    No cervical adenopathy.  Neurological: Alert and oriented to person, place, and time. Exhibits normal muscle tone. Gait normal. Coordination normal.  Skin: Skin is warm and dry. No rash noted. Not diaphoretic. No erythema. No  pallor.  Psychiatric: Mood, memory and judgment normal.  Vitals reviewed.  LABORATORY DATA: Lab Results  Component Value Date   WBC 7.8 07/09/2019   HGB 13.8 07/09/2019   HCT 43.5 07/09/2019   MCV 89.7 07/09/2019   PLT 268 07/09/2019      Chemistry      Component Value Date/Time   NA 140 07/09/2019 0934   K 4.4 07/09/2019 0934   CL 105 07/09/2019 0934   CO2 26 07/09/2019 0934   BUN 11 07/09/2019 0934   CREATININE 0.92 07/09/2019 0934      Component Value Date/Time   CALCIUM 9.3 07/09/2019 0934   ALKPHOS 106 07/09/2019 0934   AST 16 07/09/2019 0934   ALT 21 07/09/2019 0934   BILITOT 0.5 07/09/2019 0934       RADIOGRAPHIC STUDIES:  No results found.   ASSESSMENT/PLAN:  This isa very pleasant 63 year old Caucasian male with stage III non-small cell lung cancer, squamous cell carcinoma. He presented with a right upper lobe lung mass in addition to right paratracheal lymphadenopathy. He was diagnosed in December 2019.  The patient completed a course of concurrent chemoradiation with weekly carboplatin and paclitaxel. He is status post 6 cycles. Hehad a partial response to treatment.  The patient is currently undergoing consolidation immunotherapy with Imfinzi 10 mg/kg IV every 2 weeks. He is status post 11 cycles. He has been tolerating treatment well without any adverse side effects.    Labs were reviewed with the patient. I recommend that he proceed with cycle #12 today as scheduled.  The patient will have a restaging CT scan performed prior to his next visit  I will see him back for follow-up visit in 2 weeks for evaluation and to review his scan before starting cycle #13.  The patient was encouraged to continue exercising and to try to lose weight via healthy strategies such as eating a healthy balanced diet as well as exercising.  For the patient's mild dry cough at night, we will evaluate to see if there is any additional etiologies on his upcoming  CT scan. Patient afebrile and non-toxic appearing. In the meantime, he was instructed to use OTC cough medication such as delsym and try a trial of PPIs.  The patient was advised to call immediately if he has any concerning symptoms in the interval. The patient voices understanding of current disease status and treatment options and is in agreement with the current care plan. All questions were answered. The patient knows to call the clinic with any problems, questions or concerns. We can certainly see the patient much sooner if necessary   No orders of the defined types were placed in this encounter.     L , PA-C 07/09/19

## 2019-07-09 NOTE — Patient Instructions (Addendum)
[facility] Discharge Instructions for Patients Receiving Chemotherapy  Today you received the following chemotherapy agents: imfinzi.  To help prevent nausea and vomiting after your treatment, we encourage you to take your nausea medication as prescribed.   If you develop nausea and vomiting that is not controlled by your nausea medication, call the clinic.   BELOW ARE SYMPTOMS THAT SHOULD BE REPORTED IMMEDIATELY:  *FEVER GREATER THAN 100.5 F  *CHILLS WITH OR WITHOUT FEVER  NAUSEA AND VOMITING THAT IS NOT CONTROLLED WITH YOUR NAUSEA MEDICATION  *UNUSUAL SHORTNESS OF BREATH  *UNUSUAL BRUISING OR BLEEDING  TENDERNESS IN MOUTH AND THROAT WITH OR WITHOUT PRESENCE OF ULCERS  *URINARY PROBLEMS  *BOWEL PROBLEMS  UNUSUAL RASH Items with * indicate a potential emergency and should be followed up as soon as possible.  Feel free to call the clinic should you have any questions or concerns. The clinic phone number is [phone].  Please show the [facility] at check-in to the Emergency Department and triage nurse.

## 2019-07-09 NOTE — Patient Instructions (Signed)
-  Delsym OTC Cough -Omeprazole for reflux

## 2019-07-16 ENCOUNTER — Other Ambulatory Visit: Payer: Self-pay

## 2019-07-16 ENCOUNTER — Other Ambulatory Visit: Payer: Self-pay | Admitting: Internal Medicine

## 2019-07-16 ENCOUNTER — Ambulatory Visit (HOSPITAL_COMMUNITY)
Admission: RE | Admit: 2019-07-16 | Discharge: 2019-07-16 | Disposition: A | Discharge status: Home / Self Care | Payer: Medicare Other | Source: Ambulatory Visit | Attending: Internal Medicine | Admitting: Internal Medicine

## 2019-07-16 DIAGNOSIS — C3490 Malignant neoplasm of unspecified part of unspecified bronchus or lung: Secondary | ICD-10-CM | POA: Diagnosis present

## 2019-07-23 ENCOUNTER — Inpatient Hospital Stay: Payer: Medicare Other

## 2019-07-23 ENCOUNTER — Inpatient Hospital Stay: Payer: Medicare Other | Attending: Internal Medicine

## 2019-07-23 ENCOUNTER — Inpatient Hospital Stay (HOSPITAL_BASED_OUTPATIENT_CLINIC_OR_DEPARTMENT_OTHER): Payer: Medicare Other | Admitting: Physician Assistant

## 2019-07-23 ENCOUNTER — Other Ambulatory Visit: Payer: Self-pay

## 2019-07-23 ENCOUNTER — Telehealth: Payer: Self-pay | Admitting: Internal Medicine

## 2019-07-23 ENCOUNTER — Encounter: Payer: Self-pay | Admitting: Physician Assistant

## 2019-07-23 VITALS — BP 117/72 | HR 84 | Temp 98.9°F | Resp 18 | Ht 73.0 in | Wt 328.8 lb

## 2019-07-23 DIAGNOSIS — C3491 Malignant neoplasm of unspecified part of right bronchus or lung: Secondary | ICD-10-CM

## 2019-07-23 DIAGNOSIS — Z5112 Encounter for antineoplastic immunotherapy: Secondary | ICD-10-CM | POA: Insufficient documentation

## 2019-07-23 DIAGNOSIS — Z95828 Presence of other vascular implants and grafts: Secondary | ICD-10-CM

## 2019-07-23 DIAGNOSIS — C3411 Malignant neoplasm of upper lobe, right bronchus or lung: Secondary | ICD-10-CM | POA: Diagnosis present

## 2019-07-23 DIAGNOSIS — Z79899 Other long term (current) drug therapy: Secondary | ICD-10-CM | POA: Diagnosis not present

## 2019-07-23 LAB — CBC WITH DIFFERENTIAL (CANCER CENTER ONLY)
Abs Immature Granulocytes: 0.02 10*3/uL (ref 0.00–0.07)
Basophils Absolute: 0.1 10*3/uL (ref 0.0–0.1)
Basophils Relative: 1 %
Eosinophils Absolute: 0.5 10*3/uL (ref 0.0–0.5)
Eosinophils Relative: 7 %
HCT: 42 % (ref 39.0–52.0)
Hemoglobin: 13.2 g/dL (ref 13.0–17.0)
Immature Granulocytes: 0 %
Lymphocytes Relative: 20 %
Lymphs Abs: 1.4 10*3/uL (ref 0.7–4.0)
MCH: 29.1 pg (ref 26.0–34.0)
MCHC: 31.4 g/dL (ref 30.0–36.0)
MCV: 92.5 fL (ref 80.0–100.0)
Monocytes Absolute: 0.8 10*3/uL (ref 0.1–1.0)
Monocytes Relative: 12 %
Neutro Abs: 4 10*3/uL (ref 1.7–7.7)
Neutrophils Relative %: 60 %
Platelet Count: 273 10*3/uL (ref 150–400)
RBC: 4.54 MIL/uL (ref 4.22–5.81)
RDW: 15.9 % — ABNORMAL HIGH (ref 11.5–15.5)
WBC Count: 6.7 10*3/uL (ref 4.0–10.5)
nRBC: 0 % (ref 0.0–0.2)

## 2019-07-23 LAB — CMP (CANCER CENTER ONLY)
ALT: 15 U/L (ref 0–44)
AST: 15 U/L (ref 15–41)
Albumin: 3.7 g/dL (ref 3.5–5.0)
Alkaline Phosphatase: 102 U/L (ref 38–126)
Anion gap: 8 (ref 5–15)
BUN: 9 mg/dL (ref 8–23)
CO2: 31 mmol/L (ref 22–32)
Calcium: 8.8 mg/dL — ABNORMAL LOW (ref 8.9–10.3)
Chloride: 106 mmol/L (ref 98–111)
Creatinine: 0.9 mg/dL (ref 0.61–1.24)
GFR, Est AFR Am: >60 mL/min (ref >60)
GFR, Estimated: >60 mL/min (ref >60)
Glucose, Bld: 93 mg/dL (ref 70–99)
Potassium: 4.4 mmol/L (ref 3.5–5.1)
Sodium: 145 mmol/L (ref 135–145)
Total Bilirubin: 0.4 mg/dL (ref 0.3–1.2)
Total Protein: 7 g/dL (ref 6.5–8.1)

## 2019-07-23 MED ORDER — SODIUM CHLORIDE 0.9 % IV SOLN
Freq: Once | INTRAVENOUS | Status: AC
  Administered 2019-07-23: 16:00:00 via INTRAVENOUS
  Filled 2019-07-23: qty 250

## 2019-07-23 MED ORDER — SODIUM CHLORIDE 0.9 % IV SOLN
10.6000 mg/kg | Freq: Once | INTRAVENOUS | Status: AC
  Administered 2019-07-23: 1500 mg via INTRAVENOUS
  Filled 2019-07-23: qty 30

## 2019-07-23 MED ORDER — HEPARIN SOD (PORK) LOCK FLUSH 100 UNIT/ML IV SOLN
500.0000 [IU] | Freq: Once | INTRAVENOUS | Status: AC | PRN
  Administered 2019-07-23: 500 [IU]
  Filled 2019-07-23: qty 5

## 2019-07-23 MED ORDER — SODIUM CHLORIDE 0.9% FLUSH
10.0000 mL | INTRAVENOUS | Status: DC | PRN
  Administered 2019-07-23: 17:00:00 10 mL
  Filled 2019-07-23: qty 10

## 2019-07-23 MED ORDER — SODIUM CHLORIDE 0.9% FLUSH
10.0000 mL | Freq: Once | INTRAVENOUS | Status: AC
  Administered 2019-07-23: 14:00:00 10 mL
  Filled 2019-07-23: qty 10

## 2019-07-23 NOTE — Telephone Encounter (Signed)
Scheduled appt per 9/10los - pt to get an updated schedule next visit .

## 2019-07-23 NOTE — Patient Instructions (Signed)
[facility] Discharge Instructions for Patients Receiving Chemotherapy  Today you received the following chemotherapy agents: imfinzi.  To help prevent nausea and vomiting after your treatment, we encourage you to take your nausea medication as prescribed.   If you develop nausea and vomiting that is not controlled by your nausea medication, call the clinic.   BELOW ARE SYMPTOMS THAT SHOULD BE REPORTED IMMEDIATELY:  *FEVER GREATER THAN 100.5 F  *CHILLS WITH OR WITHOUT FEVER  NAUSEA AND VOMITING THAT IS NOT CONTROLLED WITH YOUR NAUSEA MEDICATION  *UNUSUAL SHORTNESS OF BREATH  *UNUSUAL BRUISING OR BLEEDING  TENDERNESS IN MOUTH AND THROAT WITH OR WITHOUT PRESENCE OF ULCERS  *URINARY PROBLEMS  *BOWEL PROBLEMS  UNUSUAL RASH Items with * indicate a potential emergency and should be followed up as soon as possible.  Feel free to call the clinic should you have any questions or concerns. The clinic phone number is [phone].  Please show the [facility] at check-in to the Emergency Department and triage nurse.

## 2019-07-23 NOTE — Progress Notes (Signed)
[facility] OFFICE PROGRESS NOTE  [name], MD [address] [place] [address]  DIAGNOSIS: Stage IIIA(T2b, N2, M0) non-small cell lung cancer, squamous cell carcinoma presented with right upper lobe lung mass in addition to right paratracheal lymphadenopathy diagnosed in December 2019. The patient also has a hypermetabolic lesion in the left parotid gland that need further evaluation.  PRIOR THERAPY: Concurrent chemoradiation with weekly carboplatin for AUC of 2 and paclitaxel 45 mg/M2.First dose started on 12/02/2018.Status post 6 cycles. Last cycle was given on January 05, 2019 the patient is receiving radiation closer to home in [place], [place] completed January 12, 2019.  CURRENT THERAPY: Consolidation treatment with immunotherapy with Imfinzi 10 mg/KG IV every 2 weeks. First dose was given February 05, 2019. Status post12cycles.  INTERVAL HISTORY: Alexander Hatfield. 63 y.o. male returns to the clinic for a follow up visit. The patient is feeling well today without any concerning complaints except for his baseline shortness of breath with exertion. The patient is still able to perform hobbies/activities such as golf. The patient continues to tolerate treatment with Imfinzi well without any adverse effects. Denies any fever, chills, night sweats, or weight loss. Denies any chest pain or hemoptysis. The patient has a mild dry cough at night when he is laying in his recliner but states that this has improved with the addition of a PPI and delsym.  Denies any nausea, vomiting, diarrhea, or constipation. Denies any headache or visual changes. Denies any rashes or skin changes.  He recently had a restaging CT scan performed.  The patient is here today for evaluation and to review his scan results before starting #13 of his treatment today  MEDICAL HISTORY: Past Medical History:  Diagnosis Date  . Arthritis   . Chronic back pain   . Chronic back pain   .  Headache   . HOH (hard of hearing)   . Hypertension   . Lung mass   . Pre-diabetes   . Sleep apnea    wears cpap  . Wears glasses   . Wears partial dentures     ALLERGIES:  is allergic to iohexol.  MEDICATIONS:  Current Outpatient Medications  Medication Sig Dispense Refill  . albuterol (PROVENTIL) (2.5 MG/3ML) 0.083% nebulizer solution VVN QID PRN    . amLODipine (NORVASC) 10 MG tablet Take 10 mg by mouth daily.     . baclofen (LIORESAL) 10 MG tablet Take 10 mg by mouth 3 (three) times daily.    . furosemide (LASIX) 20 MG tablet Take 20 mg by mouth daily.     Marland Kitchen gabapentin (NEURONTIN) 300 MG capsule Take 600 mg by mouth 3 (three) times daily. Pt states taking 3Tablets- 300mg  tablet TID    . guaifenesin (ROBITUSSIN) 100 MG/5ML syrup Take 200 mg by mouth 3 (three) times daily as needed for cough.    . lidocaine-prilocaine (EMLA) cream Apply 1 application topically as needed. (Patient not taking: Reported on 05/28/2019) 30 g 0  . losartan (COZAAR) 100 MG tablet Take 100 mg by mouth daily.     . magic mouthwash w/lidocaine SOLN Take by mouth.    . meloxicam (MOBIC) 15 MG tablet Take 15 mg by mouth daily.     Marland Kitchen oxycodone (ROXICODONE) 30 MG immediate release tablet One to two tablets every 8 hours as needed for pain.  Max 5 tab/day.  Must last 30 days.    Marland Kitchen oxyCODONE ER (XTAMPZA ER) 27 MG C12A Take 1 tablet by mouth  every 12 (twelve) hours. Oxycodone ER    . potassium chloride SA (K-DUR,KLOR-CON) 20 MEQ tablet Take 20 mEq by mouth every evening.     . prochlorperazine (COMPAZINE) 10 MG tablet Take 1 tablet (10 mg total) by mouth every 6 (six) hours as needed for nausea or vomiting. (Patient not taking: Reported on 06/11/2019) 30 tablet 0  . Umeclidinium-Vilanterol (ANORO ELLIPTA IN) Inhale into the lungs.     No current facility-administered medications for this visit.    Facility-Administered Medications Ordered in Other Visits  Medication Dose Route Frequency Provider Last Rate Last Dose   . durvalumab (IMFINZI) 1,500 mg in sodium chloride 0.9 % 100 mL chemo infusion  10.6 mg/kg (Treatment Plan Recorded) Intravenous Once Curt Bears, MD      . heparin lock flush 100 unit/mL  500 Units Intracatheter Once PRN Curt Bears, MD      . sodium chloride flush (NS) 0.9 % injection 10 mL  10 mL Intracatheter PRN Curt Bears, MD        SURGICAL HISTORY:  Past Surgical History:  Procedure Laterality Date  . ANTERIOR CERVICAL DECOMP/DISCECTOMY FUSION N/A 10/27/2018   Procedure: ANTERIOR CERVICAL THREE-FOUR, FOUR-FIVE DECOMPRESSION/DISCECTOMY FUSION TWO LEVELS;  Surgeon: Kary Kos, MD;  Location: [facility];  Service: Neurosurgery;  Laterality: N/A;   ANTERIOR CERVICAL THREE-FOUR, FOUR-FIVE DECOMPRESSION/DISCECTOMY FUSION TWO LEVELS  . BACK SURGERY    . IR IMAGING GUIDED PORT INSERTION  11/27/2018  . MULTIPLE TOOTH EXTRACTIONS    . SPINAL CORD STIMULATOR IMPLANT    . VIDEO BRONCHOSCOPY N/A 10/29/2018   Procedure: VIDEO BRONCHOSCOPY;  Surgeon: Laurin Coder, MD;  Location: [facility];  Service: Thoracic;  Laterality: N/A;  . VIDEO BRONCHOSCOPY WITH ENDOBRONCHIAL ULTRASOUND N/A 10/27/2018   Procedure: VIDEO BRONCHOSCOPY WITH ENDOBRONCHIAL ULTRASOUND;  Surgeon: Laurin Coder, MD;  Location: [facility];  Service: Thoracic;  Laterality: N/A;  . VIDEO BRONCHOSCOPY WITH ENDOBRONCHIAL ULTRASOUND N/A 10/29/2018   Procedure: VIDEO BRONCHOSCOPY WITH ENDOBRONCHIAL ULTRASOUND;  Surgeon: Laurin Coder, MD;  Location: [facility];  Service: Thoracic;  Laterality: N/A;    REVIEW OF SYSTEMS:   Review of Systems  Constitutional: Negative for appetite change, chills, fatigue, fever and unexpected weight change.  HENT: Negative for mouth sores, nosebleeds, sore throat and trouble swallowing.   Eyes: Negative for eye problems and icterus.  Respiratory: Positive for mild dry cough (improved) and shortness of breath with exertion. Negative for hemoptysis and wheezing.   Cardiovascular: Negative for  chest pain and leg swelling.  Gastrointestinal: Negative for abdominal pain, constipation, diarrhea, nausea and vomiting.  Genitourinary: Negative for bladder incontinence, difficulty urinating, dysuria, frequency and hematuria.   Musculoskeletal: Positive for chronic back pain. Negative for gait problem, neck pain and neck stiffness.  Skin: Negative for itching and rash.  Neurological: Negative for dizziness, extremity weakness, gait problem, headaches, light-headedness and seizures.  Hematological: Negative for adenopathy. Does not bruise/bleed easily.  Psychiatric/Behavioral: Negative for confusion, depression and sleep disturbance. The patient is not nervous/anxious.     PHYSICAL EXAMINATION:  Blood pressure 117/72, pulse 84, temperature 98.9 F (37.2 C), temperature source Oral, resp. rate 18, height 6\' 1"  (1.854 m), weight (!) 328 lb 12.8 oz (149.1 kg), SpO2 93 %.  ECOG PERFORMANCE STATUS: 1 - Symptomatic but completely ambulatory  Physical Exam  Constitutional: Oriented to person, place, and time and well-developed, well-nourished, and in no distress.  HENT:  Head: Normocephalic and atraumatic.  Mouth/Throat: Oropharynx is clear and moist. No oropharyngeal exudate.  Eyes: Conjunctivae  are normal. Right eye exhibits no discharge. Left eye exhibits no discharge. No scleral icterus.  Neck: Normal range of motion. Neck supple.  Cardiovascular: Normal rate, regular rhythm, normal heart sounds and intact distal pulses.   Pulmonary/Chest: Effort normal and breath sounds normal. No respiratory distress. No wheezes. No rales.  Abdominal: Soft. Bowel sounds are normal. Exhibits no distension and no mass. There is no tenderness.  Musculoskeletal: Normal range of motion. Exhibits no edema.  Lymphadenopathy:    No cervical adenopathy.  Neurological: Alert and oriented to person, place, and time. Exhibits normal muscle tone. Gait normal. Coordination normal.  Skin: Skin is warm and dry. No  rash noted. Not diaphoretic. No erythema. No pallor.  Psychiatric: Mood, memory and judgment normal.  Vitals reviewed.  LABORATORY DATA: Lab Results  Component Value Date   WBC 6.7 07/23/2019   HGB 13.2 07/23/2019   HCT 42.0 07/23/2019   MCV 92.5 07/23/2019   PLT 273 07/23/2019      Chemistry      Component Value Date/Time   NA 145 07/23/2019 1415   K 4.4 07/23/2019 1415   CL 106 07/23/2019 1415   CO2 31 07/23/2019 1415   BUN 9 07/23/2019 1415   CREATININE 0.90 07/23/2019 1415      Component Value Date/Time   CALCIUM 8.8 (L) 07/23/2019 1415   ALKPHOS 102 07/23/2019 1415   AST 15 07/23/2019 1415   ALT 15 07/23/2019 1415   BILITOT 0.4 07/23/2019 1415       RADIOGRAPHIC STUDIES:  Ct Chest Wo Contrast  Result Date: 07/16/2019 CLINICAL DATA:  Non-small cell lung cancer. EXAM: CT CHEST WITHOUT CONTRAST TECHNIQUE: Multidetector CT imaging of the chest was performed following the standard protocol without IV contrast. COMPARISON:  04/27/2019 FINDINGS: Cardiovascular: The heart size appears within normal limits. Aortic atherosclerosis. No aneurysm. The 3 vessel coronary artery calcifications. Mediastinum/Nodes: Right lobe of thyroid gland nodule measures 3 cm, image 11/2. Previously this measured the same. The trachea appears patent and is midline. Normal appearance of the esophagus. Right paratracheal lymph node measures 1.6 cm, image 52/2. Previously 1.7 cm. Subcarinal lymph node measures 1.1 cm, image 71/2. Unchanged. No enlarged axillary or supraclavicular lymph nodes. Lungs/Pleura: No pleural effusion. Fibrosis, architectural distortion and volume loss within the right upper lobe is identified compatible with changes due to external beam radiation. The index solid nodule in the posterior right upper lobe is largely obscured by masslike architectural distortion secondary to external beam radiation. Using the same measurements scheme 2.2 by 2.8 cm, image 48/4. Previously 2.8 x 2.7 cm.  Upper Abdomen: No acute abnormality. Musculoskeletal: Multi level spondylosis identified within the thoracic spine. No aggressive lytic or sclerotic bone lesions. Dorsal column stimulator identified within the lower thoracic spine. IMPRESSION: 1. The posterior right upper lobe lung lesion is obscured by changes secondary to external beam radiation. Best estimate this measures 2.2 x 2.8 cm. Previously 2.7 x 2.8 cm. No new lung lesions. 2. Slight decrease in size of previously enlarged right paratracheal lymph node. Stable borderline subcarinal lymph node. No new or progressive disease identified. 3. Aortic Atherosclerosis (ICD10-I70.0). Coronary artery calcifications. Electronically Signed   By: Kerby Moors M.D.   On: 07/16/2019 16:48     ASSESSMENT/PLAN:  This isa very pleasant 63 year old Caucasian male with stage III non-small cell lung cancer, squamous cell carcinoma. He presented with a right upper lobe lung mass in addition to right paratracheal lymphadenopathy. He was diagnosed in December 2019.  The patient completed a  course of concurrent chemoradiation with weekly carboplatin and paclitaxel. He is status post 6 cycles. Hehad a partial response to treatment.  The patient is currently undergoing consolidation immunotherapy with Imfinzi 10 mg/kg IV every 2 weeks. He is status post12cycles. He has been tolerating treatment well without any adverse side effects.  He recently had a restaging CT scan performed.  Dr. Julien Nordmann personally and independently reviewed the scan and discussed the results with the patient today.  The scan did not show any evidence of disease progression. We recommend that patient proceed with cycle #13 today scheduled  We will see the patient back for a follow-up visit in 2 weeks for evaluation before starting cycle #14.  The patient was encouraged to continue exercising and to try to lose weight via healthy strategies such as eating a healthy balanced diet  as well as exercising.  The patient will continue using PPIs and delsym for his cough  The patient was advised to call immediately if he has any concerning symptoms in the interval. The patient voices understanding of current disease status and treatment options and is in agreement with the current care plan. All questions were answered. The patient knows to call the clinic with any problems, questions or concerns. We can certainly see the patient much sooner if necessary  No orders of the defined types were placed in this encounter.    Aaylah Pokorny L Anthoney Sheppard, PA-C 07/23/19  ADDENDUM: Hematology/Oncology Attending: I had a face-to-face encounter with the patient today.  I recommended his care plan.  This is a very pleasant 63 years old white male with stage IIIb non-small cell lung cancer, squamous cell carcinoma status post induction concurrent chemoradiation with weekly carboplatin and paclitaxel with partial response.  The patient is currently undergoing consolidation treatment with immunotherapy with Imfinzi 10 mg/KG every 2 weeks status post 12 cycles.  Has been tolerating his treatment very well with no concerning adverse effects.  The patient is very active and he plays golf at regular basis. He had repeat CT scan of the chest performed recently.  I personally and independently reviewed the scans and discussed the results with the patient today. His scan showed stable to further improvement of his disease. I recommended for him to continue his consolidation treatment with immunotherapy and he will proceed with cycle #13 today. He will come back for follow-up visit in 2 weeks for evaluation before the next cycle of his treatment. He was advised to call immediately if he has any concerning symptoms in the interval.  Disclaimer: This note was dictated with voice recognition software. Similar sounding words can inadvertently be transcribed and may be missed upon review. Eilleen Kempf, MD 07/23/19

## 2019-07-31 ENCOUNTER — Telehealth: Payer: Self-pay | Admitting: *Deleted

## 2019-07-31 NOTE — Telephone Encounter (Signed)
Ok to proceed. 

## 2019-07-31 NOTE — Telephone Encounter (Signed)
Received call from Dr. Olean Ree dental office requesting medical clearance for patient have tooth extraction.  He was seen in their office today.  Please advise

## 2019-08-04 NOTE — Telephone Encounter (Signed)
[place] @ Dr. Rickard Rhymes office to advise that Dr. Julien Nordmann has given his ok for pt to have a tooth extraction. Spoke with [place] and relayed the above information.  She states she will contact the patient to schedule tooth extraction.

## 2019-08-06 ENCOUNTER — Inpatient Hospital Stay: Payer: Medicare Other

## 2019-08-06 ENCOUNTER — Other Ambulatory Visit: Payer: Self-pay

## 2019-08-06 ENCOUNTER — Encounter: Payer: Self-pay | Admitting: Internal Medicine

## 2019-08-06 ENCOUNTER — Inpatient Hospital Stay (HOSPITAL_BASED_OUTPATIENT_CLINIC_OR_DEPARTMENT_OTHER): Payer: Medicare Other | Admitting: Internal Medicine

## 2019-08-06 VITALS — BP 126/72 | HR 87 | Temp 98.9°F | Resp 18 | Ht 73.0 in | Wt 330.1 lb

## 2019-08-06 DIAGNOSIS — C3491 Malignant neoplasm of unspecified part of right bronchus or lung: Secondary | ICD-10-CM

## 2019-08-06 DIAGNOSIS — I10 Essential (primary) hypertension: Secondary | ICD-10-CM

## 2019-08-06 DIAGNOSIS — Z5112 Encounter for antineoplastic immunotherapy: Secondary | ICD-10-CM

## 2019-08-06 DIAGNOSIS — Z95828 Presence of other vascular implants and grafts: Secondary | ICD-10-CM

## 2019-08-06 LAB — CMP (CANCER CENTER ONLY)
ALT: 16 U/L (ref 0–44)
AST: 17 U/L (ref 15–41)
Albumin: 3.9 g/dL (ref 3.5–5.0)
Alkaline Phosphatase: 100 U/L (ref 38–126)
Anion gap: 8 (ref 5–15)
BUN: 11 mg/dL (ref 8–23)
CO2: 27 mmol/L (ref 22–32)
Calcium: 9.1 mg/dL (ref 8.9–10.3)
Chloride: 106 mmol/L (ref 98–111)
Creatinine: 0.91 mg/dL (ref 0.61–1.24)
GFR, Est AFR Am: >60 mL/min (ref >60)
GFR, Estimated: >60 mL/min (ref >60)
Glucose, Bld: 109 mg/dL — ABNORMAL HIGH (ref 70–99)
Potassium: 4.1 mmol/L (ref 3.5–5.1)
Sodium: 141 mmol/L (ref 135–145)
Total Bilirubin: 0.5 mg/dL (ref 0.3–1.2)
Total Protein: 7.1 g/dL (ref 6.5–8.1)

## 2019-08-06 LAB — CBC WITH DIFFERENTIAL (CANCER CENTER ONLY)
Abs Immature Granulocytes: 0.03 10*3/uL (ref 0.00–0.07)
Basophils Absolute: 0 10*3/uL (ref 0.0–0.1)
Basophils Relative: 1 %
Eosinophils Absolute: 0.4 10*3/uL (ref 0.0–0.5)
Eosinophils Relative: 7 %
HCT: 41.6 % (ref 39.0–52.0)
Hemoglobin: 13.3 g/dL (ref 13.0–17.0)
Immature Granulocytes: 1 %
Lymphocytes Relative: 21 %
Lymphs Abs: 1.2 10*3/uL (ref 0.7–4.0)
MCH: 29.5 pg (ref 26.0–34.0)
MCHC: 32 g/dL (ref 30.0–36.0)
MCV: 92.2 fL (ref 80.0–100.0)
Monocytes Absolute: 0.6 10*3/uL (ref 0.1–1.0)
Monocytes Relative: 11 %
Neutro Abs: 3.3 10*3/uL (ref 1.7–7.7)
Neutrophils Relative %: 59 %
Platelet Count: 264 10*3/uL (ref 150–400)
RBC: 4.51 MIL/uL (ref 4.22–5.81)
RDW: 15.4 % (ref 11.5–15.5)
WBC Count: 5.5 10*3/uL (ref 4.0–10.5)
nRBC: 0 % (ref 0.0–0.2)

## 2019-08-06 LAB — TSH: TSH: 1.221 u[IU]/mL (ref 0.320–4.118)

## 2019-08-06 MED ORDER — SODIUM CHLORIDE 0.9% FLUSH
10.0000 mL | INTRAVENOUS | Status: DC | PRN
  Administered 2019-08-06: 10 mL
  Filled 2019-08-06: qty 10

## 2019-08-06 MED ORDER — SODIUM CHLORIDE 0.9% FLUSH
10.0000 mL | Freq: Once | INTRAVENOUS | Status: AC
  Administered 2019-08-06: 10 mL
  Filled 2019-08-06: qty 10

## 2019-08-06 MED ORDER — SODIUM CHLORIDE 0.9 % IV SOLN
10.7000 mg/kg | Freq: Once | INTRAVENOUS | Status: AC
  Administered 2019-08-06: 1500 mg via INTRAVENOUS
  Filled 2019-08-06: qty 30

## 2019-08-06 MED ORDER — HEPARIN SOD (PORK) LOCK FLUSH 100 UNIT/ML IV SOLN
500.0000 [IU] | Freq: Once | INTRAVENOUS | Status: AC | PRN
  Administered 2019-08-06: 500 [IU]
  Filled 2019-08-06: qty 5

## 2019-08-06 MED ORDER — SODIUM CHLORIDE 0.9 % IV SOLN
Freq: Once | INTRAVENOUS | Status: AC
  Administered 2019-08-06: 09:00:00 via INTRAVENOUS
  Filled 2019-08-06: qty 250

## 2019-08-06 NOTE — Patient Instructions (Signed)
[facility] Discharge Instructions for Patients Receiving Chemotherapy  Today you received the following chemotherapy agents: imfinzi.  To help prevent nausea and vomiting after your treatment, we encourage you to take your nausea medication as prescribed.   If you develop nausea and vomiting that is not controlled by your nausea medication, call the clinic.   BELOW ARE SYMPTOMS THAT SHOULD BE REPORTED IMMEDIATELY:  *FEVER GREATER THAN 100.5 F  *CHILLS WITH OR WITHOUT FEVER  NAUSEA AND VOMITING THAT IS NOT CONTROLLED WITH YOUR NAUSEA MEDICATION  *UNUSUAL SHORTNESS OF BREATH  *UNUSUAL BRUISING OR BLEEDING  TENDERNESS IN MOUTH AND THROAT WITH OR WITHOUT PRESENCE OF ULCERS  *URINARY PROBLEMS  *BOWEL PROBLEMS  UNUSUAL RASH Items with * indicate a potential emergency and should be followed up as soon as possible.  Feel free to call the clinic should you have any questions or concerns. The clinic phone number is [phone].  Please show the [facility] at check-in to the Emergency Department and triage nurse.

## 2019-08-06 NOTE — Progress Notes (Signed)
[place] [phone]   Fax:(336) [phone]  OFFICE PROGRESS NOTE  [name], MD [place] [address]  DIAGNOSIS: stage IIIA (T2b, N2, M0) non-small cell lung cancer, squamous cell carcinoma presented with right upper lobe lung mass in addition to right paratracheal lymphadenopathy diagnosed in December 2019. The patient also has a hypermetabolic lesion in the left parotid gland that need further evaluation.  PRIOR THERAPY:  concurrent chemoradiation with weekly carboplatin for AUC of 2 and paclitaxel 45 mg/M2.  First dose started on 12/02/2018. Status post 6 cycles.  Last cycle was given on January 05, 2019 the patient is receiving radiation closer to home in [place], [place] completed January 12, 2019.   CURRENT THERAPY:  Consolidation treatment with immunotherapy with Imfinzi 10 mg/KG IV every 2 weeks.  First dose was given February 05, 2019. Status post 13 cycles.  INTERVAL HISTORY: Alexander Hatfield. 63 y.o. male returns to the clinic today for follow-up visit.  The patient is feeling fine today with no concerning complaints except for the persistent back pain and peripheral neuropathy.  He denied having any chest pain, shortness of breath except with exertion with no cough or hemoptysis.  He uses his inhaler on as-needed basis.  He denied having any recent weight loss or night sweats.  He has no nausea, vomiting, diarrhea or constipation.  He has no skin rash.  The patient is here today for evaluation before starting cycle #14 of his treatment.  MEDICAL HISTORY: Past Medical History:  Diagnosis Date   Arthritis    Chronic back pain    Chronic back pain    Headache    HOH (hard of hearing)    Hypertension    Lung mass    Pre-diabetes    Sleep apnea    wears cpap   Wears glasses    Wears partial dentures     ALLERGIES:  is allergic to iohexol.  MEDICATIONS:  Current Outpatient Medications  Medication Sig  Dispense Refill   albuterol (PROVENTIL) (2.5 MG/3ML) 0.083% nebulizer solution VVN QID PRN     amLODipine (NORVASC) 10 MG tablet Take 10 mg by mouth daily.      baclofen (LIORESAL) 10 MG tablet Take 10 mg by mouth 3 (three) times daily.     furosemide (LASIX) 20 MG tablet Take 20 mg by mouth daily.      gabapentin (NEURONTIN) 300 MG capsule Take 600 mg by mouth 3 (three) times daily. Pt states taking 3Tablets- 300mg  tablet TID     guaifenesin (ROBITUSSIN) 100 MG/5ML syrup Take 200 mg by mouth 3 (three) times daily as needed for cough.     lidocaine-prilocaine (EMLA) cream Apply 1 application topically as needed. (Patient not taking: Reported on 05/28/2019) 30 g 0   losartan (COZAAR) 100 MG tablet Take 100 mg by mouth daily.      magic mouthwash w/lidocaine SOLN Take by mouth.     meloxicam (MOBIC) 15 MG tablet Take 15 mg by mouth daily.      oxycodone (ROXICODONE) 30 MG immediate release tablet One to two tablets every 8 hours as needed for pain.  Max 5 tab/day.  Must last 30 days.     oxyCODONE ER (XTAMPZA ER) 27 MG C12A Take 1 tablet by mouth every 12 (twelve) hours. Oxycodone ER     potassium chloride SA (K-DUR,KLOR-CON) 20 MEQ tablet Take 20 mEq by mouth every evening.      prochlorperazine (  COMPAZINE) 10 MG tablet Take 1 tablet (10 mg total) by mouth every 6 (six) hours as needed for nausea or vomiting. (Patient not taking: Reported on 06/11/2019) 30 tablet 0   Umeclidinium-Vilanterol (ANORO ELLIPTA IN) Inhale into the lungs.     No current facility-administered medications for this visit.     SURGICAL HISTORY:  Past Surgical History:  Procedure Laterality Date   ANTERIOR CERVICAL DECOMP/DISCECTOMY FUSION N/A 10/27/2018   Procedure: ANTERIOR CERVICAL THREE-FOUR, FOUR-FIVE DECOMPRESSION/DISCECTOMY FUSION TWO LEVELS;  Surgeon: Kary Kos, MD;  [facility];  Service: Neurosurgery;  Laterality: N/A;   ANTERIOR CERVICAL THREE-FOUR, FOUR-FIVE DECOMPRESSION/DISCECTOMY FUSION  TWO LEVELS   BACK SURGERY     IR IMAGING GUIDED PORT INSERTION  11/27/2018   MULTIPLE TOOTH EXTRACTIONS     SPINAL CORD STIMULATOR IMPLANT     VIDEO BRONCHOSCOPY N/A 10/29/2018   Procedure: VIDEO BRONCHOSCOPY;  Surgeon: Laurin Coder, MD;  Location: [facility];  Service: Thoracic;  Laterality: N/A;   VIDEO BRONCHOSCOPY WITH ENDOBRONCHIAL ULTRASOUND N/A 10/27/2018   Procedure: VIDEO BRONCHOSCOPY WITH ENDOBRONCHIAL ULTRASOUND;  Surgeon: Laurin Coder, MD;  Location: [facility];  Service: Thoracic;  Laterality: N/A;   VIDEO BRONCHOSCOPY WITH ENDOBRONCHIAL ULTRASOUND N/A 10/29/2018   Procedure: VIDEO BRONCHOSCOPY WITH ENDOBRONCHIAL ULTRASOUND;  Surgeon: Laurin Coder, MD;  [facility];  Service: Thoracic;  Laterality: N/A;    REVIEW OF SYSTEMS:  A comprehensive review of systems was negative except for: Respiratory: positive for dyspnea on exertion Musculoskeletal: positive for back pain   PHYSICAL EXAMINATION: General appearance: alert, cooperative and no distress Head: Normocephalic, without obvious abnormality, atraumatic Neck: no adenopathy, no JVD, supple, symmetrical, trachea midline and thyroid not enlarged, symmetric, no tenderness/mass/nodules Lymph nodes: Cervical, supraclavicular, and axillary nodes normal. Resp: clear to auscultation bilaterally Back: symmetric, no curvature. ROM normal. No CVA tenderness. Cardio: regular rate and rhythm, S1, S2 normal, no murmur, click, rub or gallop GI: soft, non-tender; bowel sounds normal; no masses,  no organomegaly Extremities: extremities normal, atraumatic, no cyanosis or edema  ECOG PERFORMANCE STATUS: 1 - Symptomatic but completely ambulatory  Blood pressure 126/72, pulse 87, temperature 98.9 F (37.2 C), temperature source Temporal, resp. rate 18, height 6\' 1"  (1.854 m), weight (!) 330 lb 1.6 oz (149.7 kg), SpO2 92 %.  LABORATORY DATA: Lab Results  Component Value Date   WBC 6.7 07/23/2019   HGB 13.2 07/23/2019    HCT 42.0 07/23/2019   MCV 92.5 07/23/2019   PLT 273 07/23/2019      Chemistry      Component Value Date/Time   NA 145 07/23/2019 1415   K 4.4 07/23/2019 1415   CL 106 07/23/2019 1415   CO2 31 07/23/2019 1415   BUN 9 07/23/2019 1415   CREATININE 0.90 07/23/2019 1415      Component Value Date/Time   CALCIUM 8.8 (L) 07/23/2019 1415   ALKPHOS 102 07/23/2019 1415   AST 15 07/23/2019 1415   ALT 15 07/23/2019 1415   BILITOT 0.4 07/23/2019 1415       RADIOGRAPHIC STUDIES: Ct Chest Wo Contrast  Result Date: 07/16/2019 CLINICAL DATA:  Non-small cell lung cancer. EXAM: CT CHEST WITHOUT CONTRAST TECHNIQUE: Multidetector CT imaging of the chest was performed following the standard protocol without IV contrast. COMPARISON:  04/27/2019 FINDINGS: Cardiovascular: The heart size appears within normal limits. Aortic atherosclerosis. No aneurysm. The 3 vessel coronary artery calcifications. Mediastinum/Nodes: Right lobe of thyroid gland nodule measures 3 cm, image 11/2. Previously this measured the same. The  trachea appears patent and is midline. Normal appearance of the esophagus. Right paratracheal lymph node measures 1.6 cm, image 52/2. Previously 1.7 cm. Subcarinal lymph node measures 1.1 cm, image 71/2. Unchanged. No enlarged axillary or supraclavicular lymph nodes. Lungs/Pleura: No pleural effusion. Fibrosis, architectural distortion and volume loss within the right upper lobe is identified compatible with changes due to external beam radiation. The index solid nodule in the posterior right upper lobe is largely obscured by masslike architectural distortion secondary to external beam radiation. Using the same measurements scheme 2.2 by 2.8 cm, image 48/4. Previously 2.8 x 2.7 cm. Upper Abdomen: No acute abnormality. Musculoskeletal: Multi level spondylosis identified within the thoracic spine. No aggressive lytic or sclerotic bone lesions. Dorsal column stimulator identified within the lower thoracic  spine. IMPRESSION: 1. The posterior right upper lobe lung lesion is obscured by changes secondary to external beam radiation. Best estimate this measures 2.2 x 2.8 cm. Previously 2.7 x 2.8 cm. No new lung lesions. 2. Slight decrease in size of previously enlarged right paratracheal lymph node. Stable borderline subcarinal lymph node. No new or progressive disease identified. 3. Aortic Atherosclerosis (ICD10-I70.0). Coronary artery calcifications. Electronically Signed   By: Kerby Moors M.D.   On: 07/16/2019 16:48    ASSESSMENT AND PLAN: This is a very pleasant 63 years old white male with a stage IIIa non-small cell lung cancer, squamous cell carcinoma diagnosed in December 2019. The patient completed a course of treatment with concurrent chemoradiation with weekly carboplatin and paclitaxel status post 6 cycles.  He had partial response to this treatment. He was a started on consolidation treatment with Imfinzi status post 13 cycles. He has been tolerating this treatment well with no concerning adverse effects. I recommended for him to proceed with cycle #14 today as planned. I will see him back for follow-up visit in 2 weeks for evaluation before the next cycle of his treatment. We will continue to monitor his thyroid function closely. The patient was advised to call if he has any concerning symptoms in the interval. The patient voices understanding of current disease status and treatment options and is in agreement with the current care plan. All questions were answered. The patient knows to call the clinic with any problems, questions or concerns. We can certainly see the patient much sooner if necessary.  Disclaimer: This note was dictated with voice recognition software. Similar sounding words can inadvertently be transcribed and may not be corrected upon review.

## 2019-08-10 IMAGING — CT CT CHEST WITHOUT CONTRAST
2 of 3 series · 14 of 36 positions shown, 17 images · non-contrast
Comparison: 10/17/2018 chest CT.  10/24/2018 PET-CT.

CLINICAL DATA: Stage IIIA right upper lobe squamous cell lung
cancer diagnosed October 2018 with interval concurrent chemotherapy
and radiation therapy. Restaging.

EXAM:
CT CHEST WITHOUT CONTRAST
TECHNIQUE: Multidetector CT imaging of the chest was performed following the
standard protocol without IV contrast.

[Series 2: thorax · axial · 0.95mm/px · z∈[-291,+33]mm · 11 of 192 slices shown, 14 images]
[im 15/192  mediastinal]
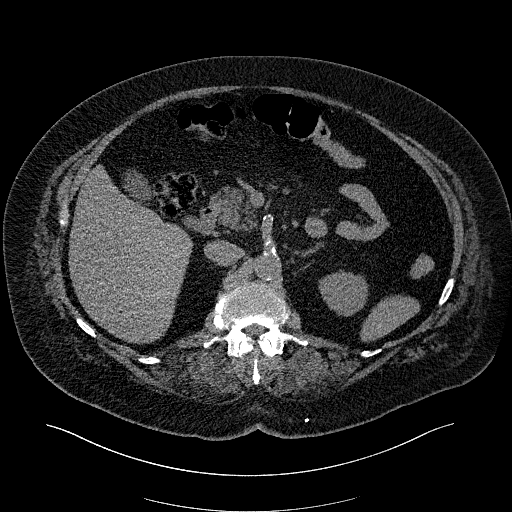
[im 15/192  lung]
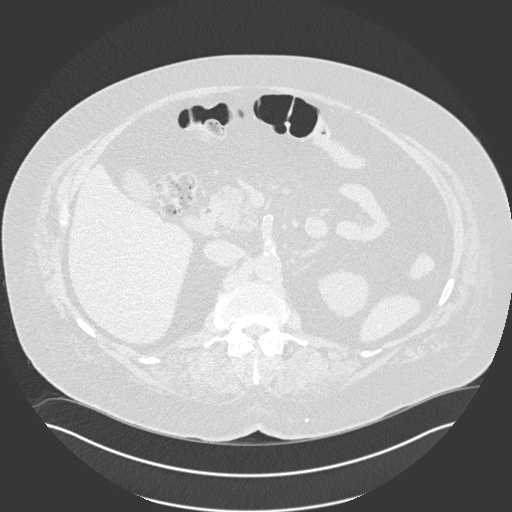
[im 29/192  lung]
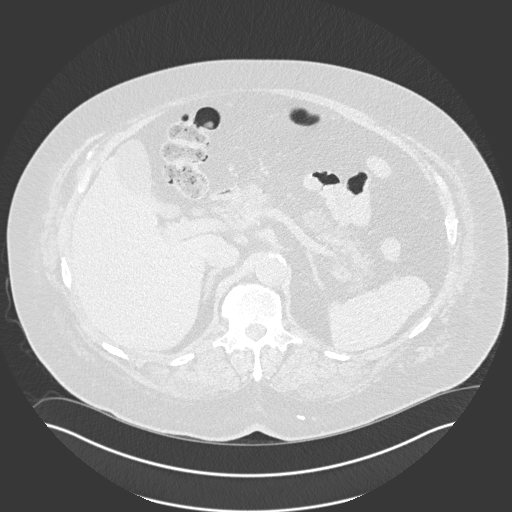
[im 43/192  lung]
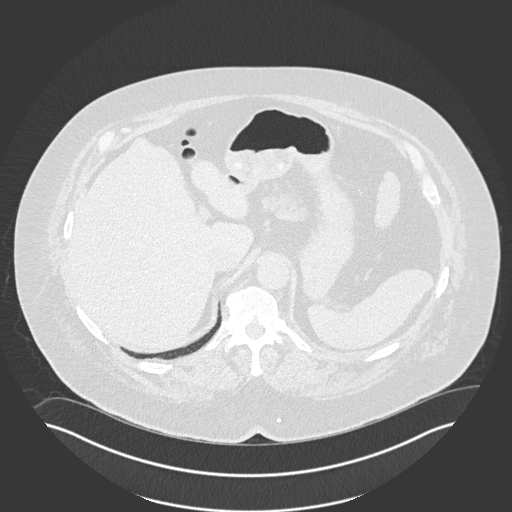
[im 64/192  lung]
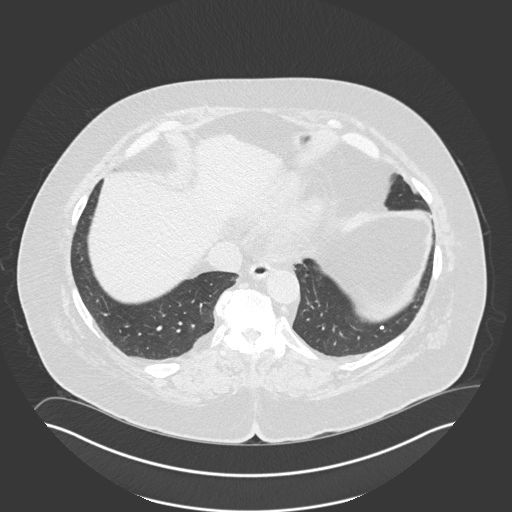
[im 78/192  mediastinal]
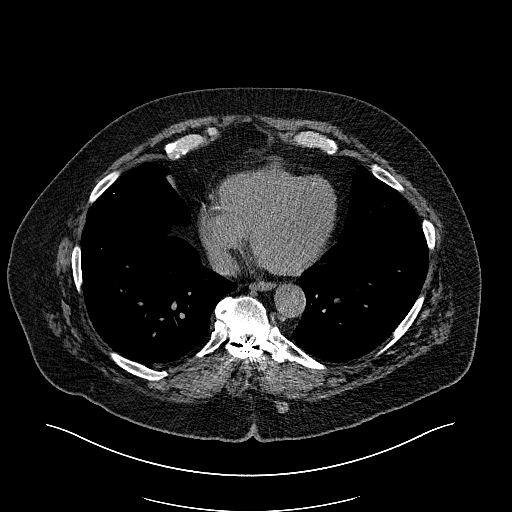
[im 78/192  lung]
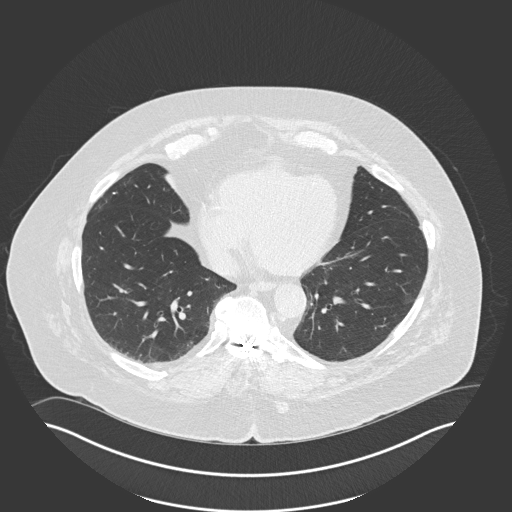
[im 100/192  lung]
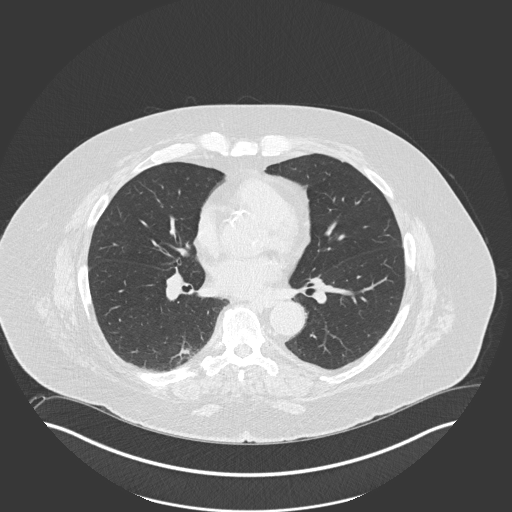
[im 114/192  lung]
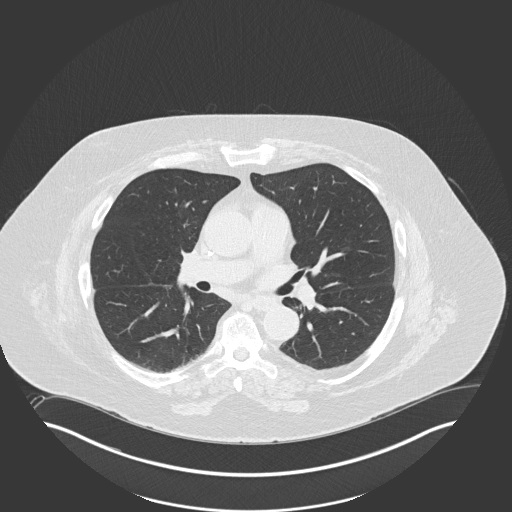
[im 128/192  lung]
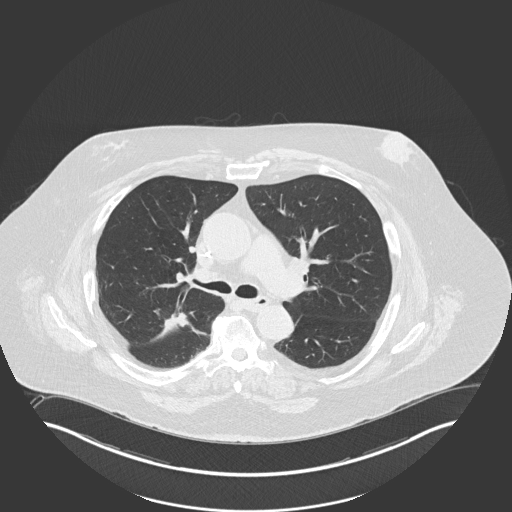
[im 149/192  mediastinal]
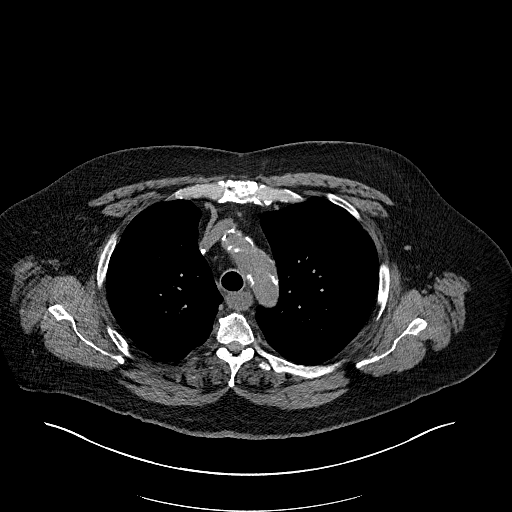
[im 149/192  lung]
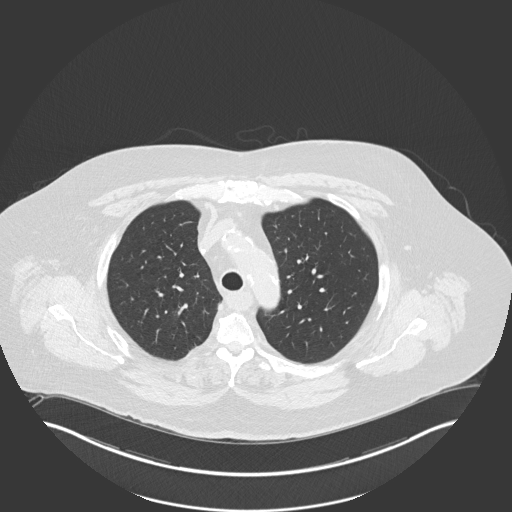
[im 163/192  lung]
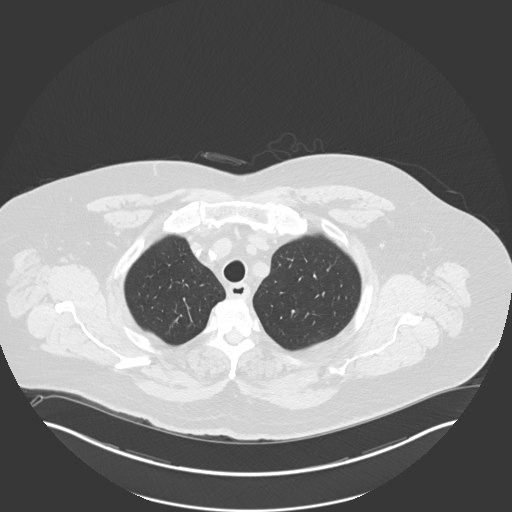
[im 177/192  lung]
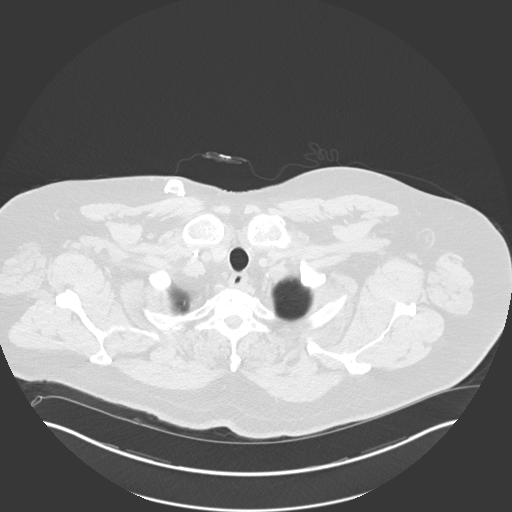

[Series 6: coronal · coronal · 0.76mm/px · 3 of 169 slices shown]
[im 34/169  lung]
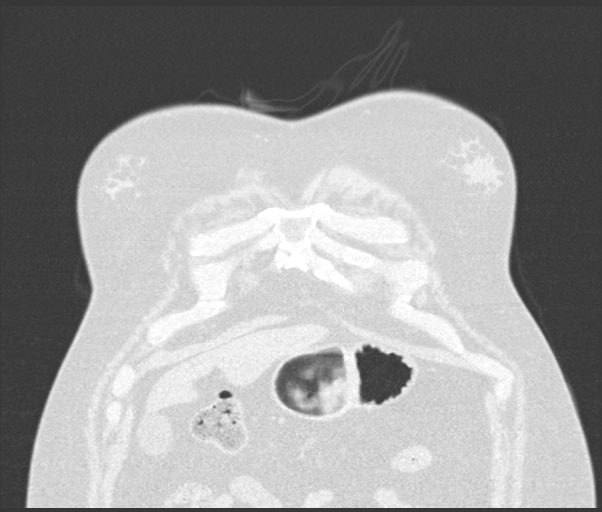
[im 68/169  lung]
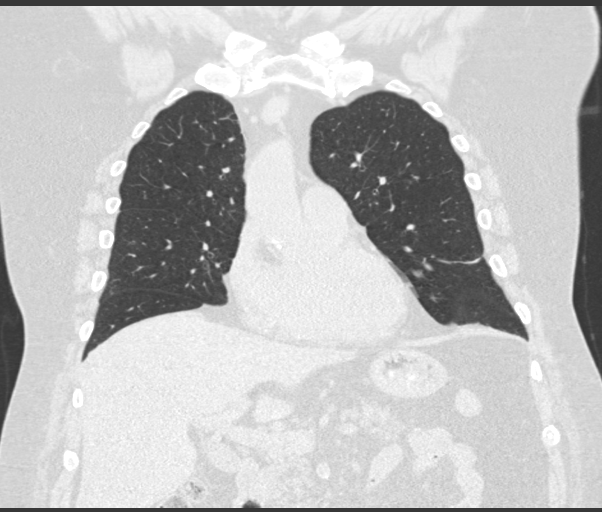
[im 101/169  lung]
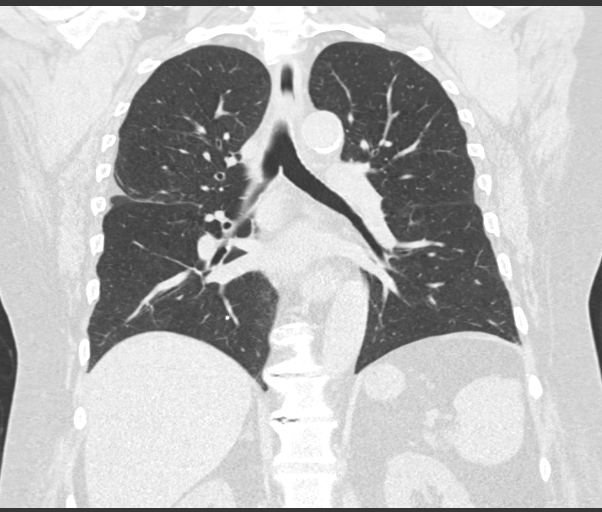

[14 of 36 positions shown; findings below may reference images not displayed]

FINDINGS: Cardiovascular: Normal heart size. No significant pericardial
effusion/thickening. Three-vessel coronary atherosclerosis. Right
internal jugular Port-A-Cath terminates at the cavoatrial junction.
Atherosclerotic thoracic aorta with stable 4.6 cm ascending thoracic
aortic aneurysm. Top-normal main pulmonary artery (3.3 cm diameter),
stable.

Mediastinum/Nodes: Stable 3.7 cm heterogeneous right thyroid nodule.
New mild circumferential wall thickening throughout the mid to upper
thoracic esophagus (series 2/image 41). No axillary adenopathy.
Enlarged 2.2 cm right paratracheal node (series 2/image 56),
previously 2.4 cm, minimally decreased. No new pathologically
enlarged mediastinal nodes. No discrete hilar adenopathy on this
noncontrast scan.

Lungs/Pleura: No pneumothorax. No pleural effusion. Spiculated
posterior right upper lobe 3.5 x 3.0 cm lung mass (series 5/image
62), decreased from 4.0 x 3.5 cm on 10/17/2018 chest CT. New vague
clustered subsolid pulmonary nodules in the posterior right lower
lobe, largest 1.3 cm (series 5/image 103). Scattered subcentimeter
calcified granulomas in both lungs. No additional new significant
pulmonary nodules. Mild centrilobular emphysema with mild diffuse
bronchial wall thickening.

Upper abdomen: Stable thick calcifications at the periphery of the
spleen compatible with remote insult.

Musculoskeletal: No aggressive appearing focal osseous lesions.
Symmetric mild to moderate bilateral gynecomastia, unchanged.
Inferior approach spinal stimulator lead is seen terminating in the
posterior lower thoracic spinal canal. Moderate thoracic
spondylosis. Partially visualized spinal fusion hardware in the
lumbar spine and overlying the cervical spine on the scout topogram.
IMPRESSION: 1. Spiculated posterior right upper lobe lung mass is mildly
decreased in size since 10/17/2018 chest CT.
2. New vague clustered subsolid pulmonary nodules in the posterior
right lower lobe, nonspecific, potentially early radiation change.
Recurrent attention on close chest CT follow-up.
3. Right paratracheal lymphadenopathy is minimally decreased.
4. New mild circumferential wall thickening throughout the mid to
upper thoracic esophagus, nonspecific, potentially treatment
related.
5. Stable 4.6 cm ascending thoracic aortic aneurysm. Ascending
thoracic aortic aneurysm. Recommend semi-annual imaging followup by
CTA or MRA and referral to cardiothoracic surgery if not already
obtained. This recommendation follows 9989
ACCF/AHA/AATS/ACR/ASA/SCA/DA PRINCE/RUDI/GOLDEN/EMILKA Guidelines for the
Diagnosis and Management of Patients With Thoracic Aortic Disease.
Circulation. 9989; 121: [id number]. Aortic aneurysm NOS
(KYNKV-4XO.P).

Aortic Atherosclerosis (KYNKV-4QS.S) and Emphysema (KYNKV-SZ0.R).

## 2019-08-20 ENCOUNTER — Inpatient Hospital Stay (HOSPITAL_BASED_OUTPATIENT_CLINIC_OR_DEPARTMENT_OTHER): Payer: Medicare Other | Admitting: Internal Medicine

## 2019-08-20 ENCOUNTER — Inpatient Hospital Stay: Payer: Medicare Other | Attending: Internal Medicine

## 2019-08-20 ENCOUNTER — Other Ambulatory Visit: Payer: Self-pay

## 2019-08-20 ENCOUNTER — Inpatient Hospital Stay: Payer: Medicare Other

## 2019-08-20 ENCOUNTER — Encounter: Payer: Self-pay | Admitting: Internal Medicine

## 2019-08-20 VITALS — BP 146/84 | HR 74 | Temp 97.9°F | Resp 18 | Ht 73.0 in | Wt 327.3 lb

## 2019-08-20 DIAGNOSIS — Z95828 Presence of other vascular implants and grafts: Secondary | ICD-10-CM

## 2019-08-20 DIAGNOSIS — Z5112 Encounter for antineoplastic immunotherapy: Secondary | ICD-10-CM | POA: Insufficient documentation

## 2019-08-20 DIAGNOSIS — C3411 Malignant neoplasm of upper lobe, right bronchus or lung: Secondary | ICD-10-CM | POA: Insufficient documentation

## 2019-08-20 DIAGNOSIS — I10 Essential (primary) hypertension: Secondary | ICD-10-CM

## 2019-08-20 DIAGNOSIS — Z79899 Other long term (current) drug therapy: Secondary | ICD-10-CM | POA: Diagnosis not present

## 2019-08-20 DIAGNOSIS — C3491 Malignant neoplasm of unspecified part of right bronchus or lung: Secondary | ICD-10-CM | POA: Diagnosis not present

## 2019-08-20 LAB — CBC WITH DIFFERENTIAL (CANCER CENTER ONLY)
Abs Immature Granulocytes: 0.02 10*3/uL (ref 0.00–0.07)
Basophils Absolute: 0.1 10*3/uL (ref 0.0–0.1)
Basophils Relative: 1 %
Eosinophils Absolute: 0.4 10*3/uL (ref 0.0–0.5)
Eosinophils Relative: 7 %
HCT: 42.8 % (ref 39.0–52.0)
Hemoglobin: 13.6 g/dL (ref 13.0–17.0)
Immature Granulocytes: 0 %
Lymphocytes Relative: 24 %
Lymphs Abs: 1.3 10*3/uL (ref 0.7–4.0)
MCH: 29.6 pg (ref 26.0–34.0)
MCHC: 31.8 g/dL (ref 30.0–36.0)
MCV: 93 fL (ref 80.0–100.0)
Monocytes Absolute: 0.7 10*3/uL (ref 0.1–1.0)
Monocytes Relative: 12 %
Neutro Abs: 3 10*3/uL (ref 1.7–7.7)
Neutrophils Relative %: 56 %
Platelet Count: 282 10*3/uL (ref 150–400)
RBC: 4.6 MIL/uL (ref 4.22–5.81)
RDW: 14.6 % (ref 11.5–15.5)
WBC Count: 5.4 10*3/uL (ref 4.0–10.5)
nRBC: 0 % (ref 0.0–0.2)

## 2019-08-20 LAB — CMP (CANCER CENTER ONLY)
ALT: 16 U/L (ref 0–44)
AST: 17 U/L (ref 15–41)
Albumin: 3.9 g/dL (ref 3.5–5.0)
Alkaline Phosphatase: 97 U/L (ref 38–126)
Anion gap: 8 (ref 5–15)
BUN: 14 mg/dL (ref 8–23)
CO2: 28 mmol/L (ref 22–32)
Calcium: 9.2 mg/dL (ref 8.9–10.3)
Chloride: 106 mmol/L (ref 98–111)
Creatinine: 0.84 mg/dL (ref 0.61–1.24)
GFR, Est AFR Am: >60 mL/min (ref >60)
GFR, Estimated: >60 mL/min (ref >60)
Glucose, Bld: 99 mg/dL (ref 70–99)
Potassium: 4.3 mmol/L (ref 3.5–5.1)
Sodium: 142 mmol/L (ref 135–145)
Total Bilirubin: 0.4 mg/dL (ref 0.3–1.2)
Total Protein: 7.2 g/dL (ref 6.5–8.1)

## 2019-08-20 MED ORDER — HEPARIN SOD (PORK) LOCK FLUSH 100 UNIT/ML IV SOLN
500.0000 [IU] | Freq: Once | INTRAVENOUS | Status: AC | PRN
  Administered 2019-08-20: 500 [IU]
  Filled 2019-08-20: qty 5

## 2019-08-20 MED ORDER — SODIUM CHLORIDE 0.9% FLUSH
10.0000 mL | Freq: Once | INTRAVENOUS | Status: AC
  Administered 2019-08-20: 10 mL
  Filled 2019-08-20: qty 10

## 2019-08-20 MED ORDER — SODIUM CHLORIDE 0.9% FLUSH
10.0000 mL | INTRAVENOUS | Status: DC | PRN
  Administered 2019-08-20: 10 mL
  Filled 2019-08-20: qty 10

## 2019-08-20 MED ORDER — SODIUM CHLORIDE 0.9 % IV SOLN
10.6000 mg/kg | Freq: Once | INTRAVENOUS | Status: AC
  Administered 2019-08-20: 1500 mg via INTRAVENOUS
  Filled 2019-08-20: qty 30

## 2019-08-20 MED ORDER — SODIUM CHLORIDE 0.9 % IV SOLN
Freq: Once | INTRAVENOUS | Status: AC
  Administered 2019-08-20: 10:00:00 via INTRAVENOUS
  Filled 2019-08-20: qty 250

## 2019-08-20 NOTE — Patient Instructions (Signed)
[facility] Discharge Instructions for Patients Receiving Chemotherapy  Today you received the following chemotherapy agents: imfinzi.  To help prevent nausea and vomiting after your treatment, we encourage you to take your nausea medication as prescribed.   If you develop nausea and vomiting that is not controlled by your nausea medication, call the clinic.   BELOW ARE SYMPTOMS THAT SHOULD BE REPORTED IMMEDIATELY:  *FEVER GREATER THAN 100.5 F  *CHILLS WITH OR WITHOUT FEVER  NAUSEA AND VOMITING THAT IS NOT CONTROLLED WITH YOUR NAUSEA MEDICATION  *UNUSUAL SHORTNESS OF BREATH  *UNUSUAL BRUISING OR BLEEDING  TENDERNESS IN MOUTH AND THROAT WITH OR WITHOUT PRESENCE OF ULCERS  *URINARY PROBLEMS  *BOWEL PROBLEMS  UNUSUAL RASH Items with * indicate a potential emergency and should be followed up as soon as possible.  Feel free to call the clinic should you have any questions or concerns. The clinic phone number is [phone].  Please show the [facility] at check-in to the Emergency Department and triage nurse.

## 2019-08-20 NOTE — Progress Notes (Signed)
[facility] Telephone:(336) [phone]   Fax:(336) [phone]  OFFICE PROGRESS NOTE  [name], MD [place] [address]  DIAGNOSIS: stage IIIA (T2b, N2, M0) non-small cell lung cancer, squamous cell carcinoma presented with right upper lobe lung mass in addition to right paratracheal lymphadenopathy diagnosed in December 2019. The patient also has a hypermetabolic lesion in the left parotid gland that need further evaluation.  PRIOR THERAPY:  concurrent chemoradiation with weekly carboplatin for AUC of 2 and paclitaxel 45 mg/M2.  First dose started on 12/02/2018. Status post 6 cycles.  Last cycle was given on January 05, 2019 the patient is receiving radiation closer to home in [place], [place] completed January 12, 2019.   CURRENT THERAPY:  Consolidation treatment with immunotherapy with Imfinzi 10 mg/KG IV every 2 weeks.  First dose was given February 05, 2019. Status post 14 cycles.  INTERVAL HISTORY: Alexander Hatfield. 64 y.o. male returns to the clinic today for follow-up visit.  The patient is feeling fine today with no concerning complaints except for the shortness of breath with exertion.  Is currently followed by [facility] pain clinic for his back pain.  He denied having any chest pain, cough or hemoptysis.  He denied having any fever or chills.  He has no nausea, vomiting, diarrhea or constipation.  He has no headache or visual changes.  He continues to tolerate his treatment with Imfinzi fairly well.  He is here today for evaluation before starting cycle #15 of his treatment.  MEDICAL HISTORY: Past Medical History:  Diagnosis Date  . Arthritis   . Chronic back pain   . Chronic back pain   . Headache   . HOH (hard of hearing)   . Hypertension   . Lung mass   . Pre-diabetes   . Sleep apnea    wears cpap  . Wears glasses   . Wears partial dentures     ALLERGIES:  is allergic to iohexol.  MEDICATIONS:  Current Outpatient Medications   Medication Sig Dispense Refill  . albuterol (PROVENTIL) (2.5 MG/3ML) 0.083% nebulizer solution VVN QID PRN    . amLODipine (NORVASC) 10 MG tablet Take 10 mg by mouth daily.     . baclofen (LIORESAL) 10 MG tablet Take 10 mg by mouth 3 (three) times daily.    . baclofen (LIORESAL) 20 MG tablet     . furosemide (LASIX) 20 MG tablet Take 20 mg by mouth daily.     Marland Kitchen gabapentin (NEURONTIN) 300 MG capsule Take 600 mg by mouth 3 (three) times daily. Pt states taking 3Tablets- 300mg  tablet TID    . guaifenesin (ROBITUSSIN) 100 MG/5ML syrup Take 200 mg by mouth 3 (three) times daily as needed for cough.    . lidocaine-prilocaine (EMLA) cream Apply 1 application topically as needed. 30 g 0  . losartan (COZAAR) 100 MG tablet Take 100 mg by mouth daily.     . magic mouthwash w/lidocaine SOLN Take by mouth.    . meloxicam (MOBIC) 15 MG tablet Take 15 mg by mouth daily.     Marland Kitchen morphine (MS CONTIN) 15 MG 12 hr tablet Take 15 mg by mouth 3 (three) times daily.    Marland Kitchen oxycodone (ROXICODONE) 30 MG immediate release tablet One to two tablets every 8 hours as needed for pain.  Max 5 tab/day.  Must last 30 days.    Marland Kitchen oxyCODONE ER (XTAMPZA ER) 27 MG C12A Take 1 tablet by mouth every  12 (twelve) hours. Oxycodone ER    . potassium chloride SA (K-DUR,KLOR-CON) 20 MEQ tablet Take 20 mEq by mouth every evening.     . prochlorperazine (COMPAZINE) 10 MG tablet Take 1 tablet (10 mg total) by mouth every 6 (six) hours as needed for nausea or vomiting. 30 tablet 0  . Umeclidinium-Vilanterol (ANORO ELLIPTA IN) Inhale into the lungs.     No current facility-administered medications for this visit.     SURGICAL HISTORY:  Past Surgical History:  Procedure Laterality Date  . ANTERIOR CERVICAL DECOMP/DISCECTOMY FUSION N/A 10/27/2018   Procedure: ANTERIOR CERVICAL THREE-FOUR, FOUR-FIVE DECOMPRESSION/DISCECTOMY FUSION TWO LEVELS;  Surgeon: Kary Kos, MD;  Location: [facility];  Service: Neurosurgery;  Laterality: N/A;   ANTERIOR  CERVICAL THREE-FOUR, FOUR-FIVE DECOMPRESSION/DISCECTOMY FUSION TWO LEVELS  . BACK SURGERY    . IR IMAGING GUIDED PORT INSERTION  11/27/2018  . MULTIPLE TOOTH EXTRACTIONS    . SPINAL CORD STIMULATOR IMPLANT    . VIDEO BRONCHOSCOPY N/A 10/29/2018   Procedure: VIDEO BRONCHOSCOPY;  Surgeon: Laurin Coder, MD;  Location: [facility];  Service: Thoracic;  Laterality: N/A;  . VIDEO BRONCHOSCOPY WITH ENDOBRONCHIAL ULTRASOUND N/A 10/27/2018   Procedure: VIDEO BRONCHOSCOPY WITH ENDOBRONCHIAL ULTRASOUND;  Surgeon: Laurin Coder, MD;  Location: [facility];  Service: Thoracic;  Laterality: N/A;  . VIDEO BRONCHOSCOPY WITH ENDOBRONCHIAL ULTRASOUND N/A 10/29/2018   Procedure: VIDEO BRONCHOSCOPY WITH ENDOBRONCHIAL ULTRASOUND;  Surgeon: Laurin Coder, MD;  [facility];  Service: Thoracic;  Laterality: N/A;    REVIEW OF SYSTEMS:  A comprehensive review of systems was negative except for: Respiratory: positive for dyspnea on exertion Musculoskeletal: positive for back pain   PHYSICAL EXAMINATION: General appearance: alert, cooperative and no distress Head: Normocephalic, without obvious abnormality, atraumatic Neck: no adenopathy, no JVD, supple, symmetrical, trachea midline and thyroid not enlarged, symmetric, no tenderness/mass/nodules Lymph nodes: Cervical, supraclavicular, and axillary nodes normal. Resp: clear to auscultation bilaterally Back: symmetric, no curvature. ROM normal. No CVA tenderness. Cardio: regular rate and rhythm, S1, S2 normal, no murmur, click, rub or gallop GI: soft, non-tender; bowel sounds normal; no masses,  no organomegaly Extremities: extremities normal, atraumatic, no cyanosis or edema  ECOG PERFORMANCE STATUS: 1 - Symptomatic but completely ambulatory  Blood pressure (!) 146/84, pulse 74, temperature 97.9 F (36.6 C), temperature source Temporal, resp. rate 18, height 6\' 1"  (1.854 m), weight (!) 327 lb 4.8 oz (148.5 kg), SpO2 95 %.  LABORATORY DATA: Lab Results   Component Value Date   WBC 5.4 08/20/2019   HGB 13.6 08/20/2019   HCT 42.8 08/20/2019   MCV 93.0 08/20/2019   PLT 282 08/20/2019      Chemistry      Component Value Date/Time   NA 141 08/06/2019 0737   K 4.1 08/06/2019 0737   CL 106 08/06/2019 0737   CO2 27 08/06/2019 0737   BUN 11 08/06/2019 0737   CREATININE 0.91 08/06/2019 0737      Component Value Date/Time   CALCIUM 9.1 08/06/2019 0737   ALKPHOS 100 08/06/2019 0737   AST 17 08/06/2019 0737   ALT 16 08/06/2019 0737   BILITOT 0.5 08/06/2019 0737       RADIOGRAPHIC STUDIES: No results found.  ASSESSMENT AND PLAN: This is a very pleasant 63 years old white male with a stage IIIa non-small cell lung cancer, squamous cell carcinoma diagnosed in December 2019. The patient completed a course of treatment with concurrent chemoradiation with weekly carboplatin and paclitaxel status post 6 cycles.  He  had partial response to this treatment. He was a started on consolidation treatment with Imfinzi status post 14 cycles. The patient has been tolerating this treatment well with no concerning adverse effects. I recommended for him to proceed with cycle #15 today as planned. I will see him back for follow-up visit in 2 weeks for evaluation before the next cycle of his treatment. For the chronic back pain he will continue his follow-up visit and evaluation with the [facility] pain clinic. The patient was advised to call immediately if he has any concerning symptoms in the interval. The patient voices understanding of current disease status and treatment options and is in agreement with the current care plan. All questions were answered. The patient knows to call the clinic with any problems, questions or concerns. We can certainly see the patient much sooner if necessary.  Disclaimer: This note was dictated with voice recognition software. Similar sounding words can inadvertently be transcribed and may not be corrected upon review.

## 2019-09-03 ENCOUNTER — Encounter: Payer: Self-pay | Admitting: Internal Medicine

## 2019-09-03 ENCOUNTER — Other Ambulatory Visit: Payer: Self-pay | Admitting: Internal Medicine

## 2019-09-03 ENCOUNTER — Inpatient Hospital Stay: Payer: Medicare Other

## 2019-09-03 ENCOUNTER — Other Ambulatory Visit: Payer: Self-pay

## 2019-09-03 ENCOUNTER — Inpatient Hospital Stay (HOSPITAL_BASED_OUTPATIENT_CLINIC_OR_DEPARTMENT_OTHER): Payer: Medicare Other | Admitting: Internal Medicine

## 2019-09-03 VITALS — BP 132/62 | HR 74 | Temp 98.5°F | Resp 17 | Ht 73.0 in | Wt 328.9 lb

## 2019-09-03 DIAGNOSIS — C3491 Malignant neoplasm of unspecified part of right bronchus or lung: Secondary | ICD-10-CM | POA: Diagnosis not present

## 2019-09-03 DIAGNOSIS — I10 Essential (primary) hypertension: Secondary | ICD-10-CM

## 2019-09-03 DIAGNOSIS — Z95828 Presence of other vascular implants and grafts: Secondary | ICD-10-CM

## 2019-09-03 DIAGNOSIS — Z5112 Encounter for antineoplastic immunotherapy: Secondary | ICD-10-CM | POA: Diagnosis not present

## 2019-09-03 LAB — CMP (CANCER CENTER ONLY)
ALT: 16 U/L (ref 0–44)
AST: 17 U/L (ref 15–41)
Albumin: 3.8 g/dL (ref 3.5–5.0)
Alkaline Phosphatase: 105 U/L (ref 38–126)
Anion gap: 9 (ref 5–15)
BUN: 11 mg/dL (ref 8–23)
CO2: 27 mmol/L (ref 22–32)
Calcium: 9.1 mg/dL (ref 8.9–10.3)
Chloride: 105 mmol/L (ref 98–111)
Creatinine: 0.87 mg/dL (ref 0.61–1.24)
GFR, Est AFR Am: >60 mL/min (ref >60)
GFR, Estimated: >60 mL/min (ref >60)
Glucose, Bld: 107 mg/dL — ABNORMAL HIGH (ref 70–99)
Potassium: 4.2 mmol/L (ref 3.5–5.1)
Sodium: 141 mmol/L (ref 135–145)
Total Bilirubin: 0.5 mg/dL (ref 0.3–1.2)
Total Protein: 7.2 g/dL (ref 6.5–8.1)

## 2019-09-03 LAB — CBC WITH DIFFERENTIAL (CANCER CENTER ONLY)
Abs Immature Granulocytes: 0.02 10*3/uL (ref 0.00–0.07)
Basophils Absolute: 0.1 10*3/uL (ref 0.0–0.1)
Basophils Relative: 1 %
Eosinophils Absolute: 0.4 10*3/uL (ref 0.0–0.5)
Eosinophils Relative: 5 %
HCT: 42.3 % (ref 39.0–52.0)
Hemoglobin: 13.7 g/dL (ref 13.0–17.0)
Immature Granulocytes: 0 %
Lymphocytes Relative: 20 %
Lymphs Abs: 1.5 10*3/uL (ref 0.7–4.0)
MCH: 30.3 pg (ref 26.0–34.0)
MCHC: 32.4 g/dL (ref 30.0–36.0)
MCV: 93.6 fL (ref 80.0–100.0)
Monocytes Absolute: 0.7 10*3/uL (ref 0.1–1.0)
Monocytes Relative: 10 %
Neutro Abs: 4.5 10*3/uL (ref 1.7–7.7)
Neutrophils Relative %: 64 %
Platelet Count: 273 10*3/uL (ref 150–400)
RBC: 4.52 MIL/uL (ref 4.22–5.81)
RDW: 14.2 % (ref 11.5–15.5)
WBC Count: 7.1 10*3/uL (ref 4.0–10.5)
nRBC: 0 % (ref 0.0–0.2)

## 2019-09-03 LAB — TSH: TSH: 0.866 u[IU]/mL (ref 0.320–4.118)

## 2019-09-03 MED ORDER — SODIUM CHLORIDE 0.9% FLUSH
10.0000 mL | Freq: Once | INTRAVENOUS | Status: AC
  Administered 2019-09-03: 10 mL
  Filled 2019-09-03: qty 10

## 2019-09-03 MED ORDER — SODIUM CHLORIDE 0.9% FLUSH
10.0000 mL | INTRAVENOUS | Status: DC | PRN
  Administered 2019-09-03: 10 mL
  Filled 2019-09-03: qty 10

## 2019-09-03 MED ORDER — SODIUM CHLORIDE 0.9 % IV SOLN
Freq: Once | INTRAVENOUS | Status: AC
  Administered 2019-09-03: 12:00:00 via INTRAVENOUS
  Filled 2019-09-03: qty 250

## 2019-09-03 MED ORDER — HEPARIN SOD (PORK) LOCK FLUSH 100 UNIT/ML IV SOLN
500.0000 [IU] | Freq: Once | INTRAVENOUS | Status: AC | PRN
  Administered 2019-09-03: 500 [IU]
  Filled 2019-09-03: qty 5

## 2019-09-03 MED ORDER — SODIUM CHLORIDE 0.9 % IV SOLN
10.6000 mg/kg | Freq: Once | INTRAVENOUS | Status: AC
  Administered 2019-09-03: 1500 mg via INTRAVENOUS
  Filled 2019-09-03: qty 30

## 2019-09-03 NOTE — Progress Notes (Signed)
[place] [phone]   [phone]  OFFICE PROGRESS NOTE  [name], MD [place] [address]  DIAGNOSIS: stage IIIA (T2b, N2, M0) non-small cell lung cancer, squamous cell carcinoma presented with right upper lobe lung mass in addition to right paratracheal lymphadenopathy diagnosed in December 2019. The patient also has a hypermetabolic lesion in the left parotid gland that need further evaluation.  PRIOR THERAPY:  concurrent chemoradiation with weekly carboplatin for AUC of 2 and paclitaxel 45 mg/M2.  First dose started on 12/02/2018. Status post 6 cycles.  Last cycle was given on January 05, 2019 the patient is receiving radiation closer to home in [place], [place] completed January 12, 2019.   CURRENT THERAPY:  Consolidation treatment with immunotherapy with Imfinzi 10 mg/KG IV every 2 weeks.  First dose was given February 05, 2019. Status post 15 cycles.  INTERVAL HISTORY: Alexander Hatfield. 63 y.o. male returns to the clinic today for follow-up visit.  The patient is feeling fine today with no concerning complaints.  His back pain is much better after receiving treatment with Percocet from the pain clinic.  He denied having any current chest pain, shortness of breath, cough or hemoptysis.  He continues to play golf at regular basis.  He has no nausea, vomiting, diarrhea or constipation.  He denied having any headache or visual changes.  The patient is here today for evaluation before starting cycle #16 of his treatment.  MEDICAL HISTORY: Past Medical History:  Diagnosis Date   Arthritis    Chronic back pain    Chronic back pain    Headache    HOH (hard of hearing)    Hypertension    Lung mass    Pre-diabetes    Sleep apnea    wears cpap   Wears glasses    Wears partial dentures     ALLERGIES:  is allergic to iohexol.  MEDICATIONS:  Current Outpatient Medications  Medication Sig Dispense Refill    albuterol (PROVENTIL) (2.5 MG/3ML) 0.083% nebulizer solution VVN QID PRN     amLODipine (NORVASC) 10 MG tablet Take 10 mg by mouth daily.      baclofen (LIORESAL) 10 MG tablet Take 10 mg by mouth 3 (three) times daily.     baclofen (LIORESAL) 20 MG tablet      furosemide (LASIX) 20 MG tablet Take 20 mg by mouth daily.      gabapentin (NEURONTIN) 300 MG capsule Take 600 mg by mouth 3 (three) times daily. Pt states taking 3Tablets- 300mg  tablet TID     guaifenesin (ROBITUSSIN) 100 MG/5ML syrup Take 200 mg by mouth 3 (three) times daily as needed for cough.     lidocaine-prilocaine (EMLA) cream Apply 1 application topically as needed. 30 g 0   losartan (COZAAR) 100 MG tablet Take 100 mg by mouth daily.      magic mouthwash w/lidocaine SOLN Take by mouth.     meloxicam (MOBIC) 15 MG tablet Take 15 mg by mouth daily.      morphine (MS CONTIN) 15 MG 12 hr tablet Take 15 mg by mouth 3 (three) times daily.     oxycodone (ROXICODONE) 30 MG immediate release tablet One to two tablets every 8 hours as needed for pain.  Max 5 tab/day.  Must last 30 days.     oxyCODONE ER (XTAMPZA ER) 27 MG C12A Take 1 tablet by mouth every 12 (twelve) hours. Oxycodone ER  potassium chloride SA (K-DUR,KLOR-CON) 20 MEQ tablet Take 20 mEq by mouth every evening.      prochlorperazine (COMPAZINE) 10 MG tablet Take 1 tablet (10 mg total) by mouth every 6 (six) hours as needed for nausea or vomiting. 30 tablet 0   Umeclidinium-Vilanterol (ANORO ELLIPTA IN) Inhale into the lungs.     No current facility-administered medications for this visit.     SURGICAL HISTORY:  Past Surgical History:  Procedure Laterality Date   ANTERIOR CERVICAL DECOMP/DISCECTOMY FUSION N/A 10/27/2018   Procedure: ANTERIOR CERVICAL THREE-FOUR, FOUR-FIVE DECOMPRESSION/DISCECTOMY FUSION TWO LEVELS;  Surgeon: Kary Kos, MD;  Location: [facility];  Service: Neurosurgery;  Laterality: N/A;   ANTERIOR CERVICAL THREE-FOUR, FOUR-FIVE  DECOMPRESSION/DISCECTOMY FUSION TWO LEVELS   BACK SURGERY     IR IMAGING GUIDED PORT INSERTION  11/27/2018   MULTIPLE TOOTH EXTRACTIONS     SPINAL CORD STIMULATOR IMPLANT     VIDEO BRONCHOSCOPY N/A 10/29/2018   Procedure: VIDEO BRONCHOSCOPY;  Surgeon: Laurin Coder, MD;  Location: [facility];  Service: Thoracic;  Laterality: N/A;   VIDEO BRONCHOSCOPY WITH ENDOBRONCHIAL ULTRASOUND N/A 10/27/2018   Procedure: VIDEO BRONCHOSCOPY WITH ENDOBRONCHIAL ULTRASOUND;  Surgeon: Laurin Coder, MD;  Location: [facility];  Service: Thoracic;  Laterality: N/A;   VIDEO BRONCHOSCOPY WITH ENDOBRONCHIAL ULTRASOUND N/A 10/29/2018   Procedure: VIDEO BRONCHOSCOPY WITH ENDOBRONCHIAL ULTRASOUND;  Surgeon: Laurin Coder, MD;  [facility];  Service: Thoracic;  Laterality: N/A;    REVIEW OF SYSTEMS:  A comprehensive review of systems was negative except for: Respiratory: positive for dyspnea on exertion   PHYSICAL EXAMINATION: General appearance: alert, cooperative and no distress Head: Normocephalic, without obvious abnormality, atraumatic Neck: no adenopathy, no JVD, supple, symmetrical, trachea midline and thyroid not enlarged, symmetric, no tenderness/mass/nodules Lymph nodes: Cervical, supraclavicular, and axillary nodes normal. Resp: clear to auscultation bilaterally Back: symmetric, no curvature. ROM normal. No CVA tenderness. Cardio: regular rate and rhythm, S1, S2 normal, no murmur, click, rub or gallop GI: soft, non-tender; bowel sounds normal; no masses,  no organomegaly Extremities: extremities normal, atraumatic, no cyanosis or edema  ECOG PERFORMANCE STATUS: 1 - Symptomatic but completely ambulatory  Blood pressure 132/62, pulse 74, temperature 98.5 F (36.9 C), temperature source Oral, resp. rate 17, height 6\' 1"  (1.854 m), weight (!) 328 lb 14.4 oz (149.2 kg), SpO2 94 %.  LABORATORY DATA: Lab Results  Component Value Date   WBC 7.1 09/03/2019   HGB 13.7 09/03/2019   HCT 42.3  09/03/2019   MCV 93.6 09/03/2019   PLT 273 09/03/2019      Chemistry      Component Value Date/Time   NA 142 08/20/2019 0928   K 4.3 08/20/2019 0928   CL 106 08/20/2019 0928   CO2 28 08/20/2019 0928   BUN 14 08/20/2019 0928   CREATININE 0.84 08/20/2019 0928      Component Value Date/Time   CALCIUM 9.2 08/20/2019 0928   ALKPHOS 97 08/20/2019 0928   AST 17 08/20/2019 0928   ALT 16 08/20/2019 0928   BILITOT 0.4 08/20/2019 0928       RADIOGRAPHIC STUDIES: No results found.  ASSESSMENT AND PLAN: This is a very pleasant 63 years old white male with a stage IIIa non-small cell lung cancer, squamous cell carcinoma diagnosed in December 2019. The patient completed a course of treatment with concurrent chemoradiation with weekly carboplatin and paclitaxel status post 6 cycles.  He had partial response to this treatment. He was a started on consolidation treatment with Imfinzi  status post 15 cycles. The patient continues to tolerate his treatment well with no concerning adverse effects. I recommended for him to proceed with cycle #16 today as planned. For the chronic back pain he will continue his follow-up visit and evaluation with the [facility] pain clinic. I will see the patient back for follow-up visit in 2 weeks for evaluation before the next cycle of his treatment. The patient was advised to call immediately if he has any concerning symptoms in the interval. The patient voices understanding of current disease status and treatment options and is in agreement with the current care plan. All questions were answered. The patient knows to call the clinic with any problems, questions or concerns. We can certainly see the patient much sooner if necessary.  Disclaimer: This note was dictated with voice recognition software. Similar sounding words can inadvertently be transcribed and may not be corrected upon review.

## 2019-09-03 NOTE — Patient Instructions (Signed)

## 2019-09-03 NOTE — Patient Instructions (Signed)
[facility] Discharge Instructions for Patients Receiving Chemotherapy  Today you received the following chemotherapy agents: Imfinzi.  To help prevent nausea and vomiting after your treatment, we encourage you to take your nausea medication as directed.   If you develop nausea and vomiting that is not controlled by your nausea medication, call the clinic.   BELOW ARE SYMPTOMS THAT SHOULD BE REPORTED IMMEDIATELY:  *FEVER GREATER THAN 100.5 F  *CHILLS WITH OR WITHOUT FEVER  NAUSEA AND VOMITING THAT IS NOT CONTROLLED WITH YOUR NAUSEA MEDICATION  *UNUSUAL SHORTNESS OF BREATH  *UNUSUAL BRUISING OR BLEEDING  TENDERNESS IN MOUTH AND THROAT WITH OR WITHOUT PRESENCE OF ULCERS  *URINARY PROBLEMS  *BOWEL PROBLEMS  UNUSUAL RASH Items with * indicate a potential emergency and should be followed up as soon as possible.  Feel free to call the clinic should you have any questions or concerns. The clinic phone number is [phone].  Please show the CHEMO ALERT CARD at check-in to the Emergency Department and triage nurse.   

## 2019-09-04 ENCOUNTER — Telehealth: Payer: Self-pay | Admitting: Internal Medicine

## 2019-09-04 NOTE — Telephone Encounter (Signed)
Per 10/22 los, appts already scheduled.

## 2019-09-17 ENCOUNTER — Inpatient Hospital Stay (HOSPITAL_BASED_OUTPATIENT_CLINIC_OR_DEPARTMENT_OTHER): Payer: Medicare Other | Admitting: Internal Medicine

## 2019-09-17 ENCOUNTER — Inpatient Hospital Stay: Payer: Medicare Other | Attending: Internal Medicine

## 2019-09-17 ENCOUNTER — Encounter: Payer: Self-pay | Admitting: Internal Medicine

## 2019-09-17 ENCOUNTER — Other Ambulatory Visit: Payer: Self-pay

## 2019-09-17 ENCOUNTER — Inpatient Hospital Stay: Payer: Medicare Other

## 2019-09-17 VITALS — BP 145/75 | HR 73 | Temp 98.5°F | Resp 18 | Ht 73.0 in | Wt 328.0 lb

## 2019-09-17 DIAGNOSIS — I10 Essential (primary) hypertension: Secondary | ICD-10-CM | POA: Diagnosis not present

## 2019-09-17 DIAGNOSIS — C3491 Malignant neoplasm of unspecified part of right bronchus or lung: Secondary | ICD-10-CM

## 2019-09-17 DIAGNOSIS — Z5112 Encounter for antineoplastic immunotherapy: Secondary | ICD-10-CM | POA: Diagnosis present

## 2019-09-17 DIAGNOSIS — C3411 Malignant neoplasm of upper lobe, right bronchus or lung: Secondary | ICD-10-CM | POA: Diagnosis present

## 2019-09-17 DIAGNOSIS — Z79899 Other long term (current) drug therapy: Secondary | ICD-10-CM | POA: Diagnosis not present

## 2019-09-17 DIAGNOSIS — Z95828 Presence of other vascular implants and grafts: Secondary | ICD-10-CM

## 2019-09-17 LAB — CBC WITH DIFFERENTIAL (CANCER CENTER ONLY)
Abs Immature Granulocytes: 0.03 10*3/uL (ref 0.00–0.07)
Basophils Absolute: 0.1 10*3/uL (ref 0.0–0.1)
Basophils Relative: 1 %
Eosinophils Absolute: 0.3 10*3/uL (ref 0.0–0.5)
Eosinophils Relative: 4 %
HCT: 43.6 % (ref 39.0–52.0)
Hemoglobin: 14 g/dL (ref 13.0–17.0)
Immature Granulocytes: 1 %
Lymphocytes Relative: 22 %
Lymphs Abs: 1.5 10*3/uL (ref 0.7–4.0)
MCH: 29.9 pg (ref 26.0–34.0)
MCHC: 32.1 g/dL (ref 30.0–36.0)
MCV: 93.2 fL (ref 80.0–100.0)
Monocytes Absolute: 0.8 10*3/uL (ref 0.1–1.0)
Monocytes Relative: 12 %
Neutro Abs: 4 10*3/uL (ref 1.7–7.7)
Neutrophils Relative %: 60 %
Platelet Count: 285 10*3/uL (ref 150–400)
RBC: 4.68 MIL/uL (ref 4.22–5.81)
RDW: 14.1 % (ref 11.5–15.5)
WBC Count: 6.6 10*3/uL (ref 4.0–10.5)
nRBC: 0 % (ref 0.0–0.2)

## 2019-09-17 LAB — CMP (CANCER CENTER ONLY)
ALT: 22 U/L (ref 0–44)
AST: 20 U/L (ref 15–41)
Albumin: 3.9 g/dL (ref 3.5–5.0)
Alkaline Phosphatase: 100 U/L (ref 38–126)
Anion gap: 12 (ref 5–15)
BUN: 11 mg/dL (ref 8–23)
CO2: 26 mmol/L (ref 22–32)
Calcium: 9 mg/dL (ref 8.9–10.3)
Chloride: 103 mmol/L (ref 98–111)
Creatinine: 0.86 mg/dL (ref 0.61–1.24)
GFR, Est AFR Am: >60 mL/min (ref >60)
GFR, Estimated: >60 mL/min (ref >60)
Glucose, Bld: 92 mg/dL (ref 70–99)
Potassium: 4 mmol/L (ref 3.5–5.1)
Sodium: 141 mmol/L (ref 135–145)
Total Bilirubin: 0.4 mg/dL (ref 0.3–1.2)
Total Protein: 7.3 g/dL (ref 6.5–8.1)

## 2019-09-17 MED ORDER — SODIUM CHLORIDE 0.9% FLUSH
10.0000 mL | Freq: Once | INTRAVENOUS | Status: AC
  Administered 2019-09-17: 10 mL
  Filled 2019-09-17: qty 10

## 2019-09-17 MED ORDER — SODIUM CHLORIDE 0.9 % IV SOLN
Freq: Once | INTRAVENOUS | Status: AC
  Administered 2019-09-17: 12:00:00 via INTRAVENOUS
  Filled 2019-09-17: qty 250

## 2019-09-17 MED ORDER — SODIUM CHLORIDE 0.9 % IV SOLN
10.6000 mg/kg | Freq: Once | INTRAVENOUS | Status: AC
  Administered 2019-09-17: 1500 mg via INTRAVENOUS
  Filled 2019-09-17: qty 30

## 2019-09-17 MED ORDER — SODIUM CHLORIDE 0.9% FLUSH
10.0000 mL | INTRAVENOUS | Status: DC | PRN
  Administered 2019-09-17: 10 mL
  Filled 2019-09-17: qty 10

## 2019-09-17 MED ORDER — HEPARIN SOD (PORK) LOCK FLUSH 100 UNIT/ML IV SOLN
500.0000 [IU] | Freq: Once | INTRAVENOUS | Status: AC | PRN
  Administered 2019-09-17: 500 [IU]
  Filled 2019-09-17: qty 5

## 2019-09-17 NOTE — Patient Instructions (Signed)
[facility] Discharge Instructions for Patients Receiving Chemotherapy  Today you received the following chemotherapy agents: Imfinzi.  To help prevent nausea and vomiting after your treatment, we encourage you to take your nausea medication as directed.   If you develop nausea and vomiting that is not controlled by your nausea medication, call the clinic.   BELOW ARE SYMPTOMS THAT SHOULD BE REPORTED IMMEDIATELY:  *FEVER GREATER THAN 100.5 F  *CHILLS WITH OR WITHOUT FEVER  NAUSEA AND VOMITING THAT IS NOT CONTROLLED WITH YOUR NAUSEA MEDICATION  *UNUSUAL SHORTNESS OF BREATH  *UNUSUAL BRUISING OR BLEEDING  TENDERNESS IN MOUTH AND THROAT WITH OR WITHOUT PRESENCE OF ULCERS  *URINARY PROBLEMS  *BOWEL PROBLEMS  UNUSUAL RASH Items with * indicate a potential emergency and should be followed up as soon as possible.  Feel free to call the clinic should you have any questions or concerns. The clinic phone number is [phone].  Please show the CHEMO ALERT CARD at check-in to the Emergency Department and triage nurse.   

## 2019-09-17 NOTE — Patient Instructions (Signed)

## 2019-09-17 NOTE — Progress Notes (Signed)
[place] [phone]   [phone]) [phone]  OFFICE PROGRESS NOTE  [name], MD [place] [address]  DIAGNOSIS: stage IIIA (T2b, N2, M0) non-small cell lung cancer, squamous cell carcinoma presented with right upper lobe lung mass in addition to right paratracheal lymphadenopathy diagnosed in December 2019. The patient also has a hypermetabolic lesion in the left parotid gland that need further evaluation.  PRIOR THERAPY:  concurrent chemoradiation with weekly carboplatin for AUC of 2 and paclitaxel 45 mg/M2.  First dose started on 12/02/2018. Status post 6 cycles.  Last cycle was given on January 05, 2019 the patient is receiving radiation closer to home in [place], [place] completed January 12, 2019.   CURRENT THERAPY:  Consolidation treatment with immunotherapy with Imfinzi 10 mg/KG IV every 2 weeks.  First dose was given February 05, 2019. Status post 16 cycles.  INTERVAL HISTORY: Alexander Hatfield. 63 y.o. male returns to the clinic today for follow-up visit.  The patient is feeling fine today with no concerning complaints.  He continues to play golf at regular basis.  He denied having any chest pain, shortness of breath, cough or hemoptysis.  He denied having any fever or chills.  He has no nausea, vomiting, diarrhea or constipation.  He has no headache or visual changes.  The patient is here today for evaluation before starting cycle #17.  MEDICAL HISTORY: Past Medical History:  Diagnosis Date  . Arthritis   . Chronic back pain   . Chronic back pain   . Headache   . HOH (hard of hearing)   . Hypertension   . Lung mass   . Pre-diabetes   . Sleep apnea    wears cpap  . Wears glasses   . Wears partial dentures     ALLERGIES:  is allergic to iohexol.  MEDICATIONS:  Current Outpatient Medications  Medication Sig Dispense Refill  . albuterol (PROVENTIL) (2.5 MG/3ML) 0.083% nebulizer solution VVN QID PRN    .  amLODipine (NORVASC) 10 MG tablet Take 10 mg by mouth daily.     . baclofen (LIORESAL) 10 MG tablet Take 10 mg by mouth 3 (three) times daily.    . baclofen (LIORESAL) 20 MG tablet     . furosemide (LASIX) 20 MG tablet Take 20 mg by mouth daily.     Marland Kitchen gabapentin (NEURONTIN) 300 MG capsule Take 600 mg by mouth 3 (three) times daily. Pt states taking 3Tablets- 300mg  tablet TID    . guaifenesin (ROBITUSSIN) 100 MG/5ML syrup Take 200 mg by mouth 3 (three) times daily as needed for cough.    . lidocaine-prilocaine (EMLA) cream Apply 1 application topically as needed. 30 g 0  . losartan (COZAAR) 100 MG tablet Take 100 mg by mouth daily.     . magic mouthwash w/lidocaine SOLN Take by mouth.    . meloxicam (MOBIC) 15 MG tablet Take 15 mg by mouth daily.     Marland Kitchen morphine (MS CONTIN) 15 MG 12 hr tablet Take 15 mg by mouth 3 (three) times daily.    Marland Kitchen oxycodone (ROXICODONE) 30 MG immediate release tablet One to two tablets every 8 hours as needed for pain.  Max 5 tab/day.  Must last 30 days.    Marland Kitchen oxyCODONE ER (XTAMPZA ER) 27 MG C12A Take 1 tablet by mouth every 12 (twelve) hours. Oxycodone ER    . potassium chloride SA (K-DUR,KLOR-CON) 20 MEQ tablet Take 20 mEq by mouth  every evening.     . prochlorperazine (COMPAZINE) 10 MG tablet Take 1 tablet (10 mg total) by mouth every 6 (six) hours as needed for nausea or vomiting. 30 tablet 0  . Umeclidinium-Vilanterol (ANORO ELLIPTA IN) Inhale into the lungs.     No current facility-administered medications for this visit.     SURGICAL HISTORY:  Past Surgical History:  Procedure Laterality Date  . ANTERIOR CERVICAL DECOMP/DISCECTOMY FUSION N/A 10/27/2018   Procedure: ANTERIOR CERVICAL THREE-FOUR, FOUR-FIVE DECOMPRESSION/DISCECTOMY FUSION TWO LEVELS;  Surgeon: Kary Kos, MD;  Location: [facility];  Service: Neurosurgery;  Laterality: N/A;   ANTERIOR CERVICAL THREE-FOUR, FOUR-FIVE DECOMPRESSION/DISCECTOMY FUSION TWO LEVELS  . BACK SURGERY    . IR IMAGING GUIDED PORT  INSERTION  11/27/2018  . MULTIPLE TOOTH EXTRACTIONS    . SPINAL CORD STIMULATOR IMPLANT    . VIDEO BRONCHOSCOPY N/A 10/29/2018   Procedure: VIDEO BRONCHOSCOPY;  Surgeon: Laurin Coder, MD;  Location[facility];  Service: Thoracic;  Laterality: N/A;  . VIDEO BRONCHOSCOPY WITH ENDOBRONCHIAL ULTRASOUND N/A 10/27/2018   Procedure: VIDEO BRONCHOSCOPY WITH ENDOBRONCHIAL ULTRASOUND;  Surgeon: Laurin Coder, MD;  Location: [facility];  Service: Thoracic;  Laterality: N/A;  . VIDEO BRONCHOSCOPY WITH ENDOBRONCHIAL ULTRASOUND N/A 10/29/2018   Procedure: VIDEO BRONCHOSCOPY WITH ENDOBRONCHIAL ULTRASOUND;  Surgeon: Laurin Coder, MD;  Location[facility];  Service: Thoracic;  Laterality: N/A;    REVIEW OF SYSTEMS:  A comprehensive review of systems was negative.   PHYSICAL EXAMINATION: General appearance: alert, cooperative and no distress Head: Normocephalic, without obvious abnormality, atraumatic Neck: no adenopathy, no JVD, supple, symmetrical, trachea midline and thyroid not enlarged, symmetric, no tenderness/mass/nodules Lymph nodes: Cervical, supraclavicular, and axillary nodes normal. Resp: clear to auscultation bilaterally Back: symmetric, no curvature. ROM normal. No CVA tenderness. Cardio: regular rate and rhythm, S1, S2 normal, no murmur, click, rub or gallop GI: soft, non-tender; bowel sounds normal; no masses,  no organomegaly Extremities: extremities normal, atraumatic, no cyanosis or edema  ECOG PERFORMANCE STATUS: 1 - Symptomatic but completely ambulatory  Blood pressure (!) 145/75, pulse 73, temperature 98.5 F (36.9 C), temperature source Temporal, resp. rate 18, height 6\' 1"  (1.854 m), weight (!) 328 lb (148.8 kg), SpO2 98 %.  LABORATORY DATA: Lab Results  Component Value Date   WBC 6.6 09/17/2019   HGB 14.0 09/17/2019   HCT 43.6 09/17/2019   MCV 93.2 09/17/2019   PLT 285 09/17/2019      Chemistry      Component Value Date/Time   NA 141 09/03/2019 1045   K 4.2  09/03/2019 1045   CL 105 09/03/2019 1045   CO2 27 09/03/2019 1045   BUN 11 09/03/2019 1045   CREATININE 0.87 09/03/2019 1045      Component Value Date/Time   CALCIUM 9.1 09/03/2019 1045   ALKPHOS 105 09/03/2019 1045   AST 17 09/03/2019 1045   ALT 16 09/03/2019 1045   BILITOT 0.5 09/03/2019 1045       RADIOGRAPHIC STUDIES: No results found.  ASSESSMENT AND PLAN: This is a very pleasant 63 years old white male with a stage IIIa non-small cell lung cancer, squamous cell carcinoma diagnosed in December 2019. The patient completed a course of treatment with concurrent chemoradiation with weekly carboplatin and paclitaxel status post 6 cycles.  He had partial response to this treatment. He was a started on consolidation treatment with Imfinzi status post 16 cycles. The patient continues to tolerate his treatment well with no concerning adverse effects. I recommended for him  to proceed with cycle #17 today as planned. I will see him back for follow-up visit in 2 weeks for evaluation before the next cycle of his treatment. For the chronic back pain he will continue his follow-up visit and evaluation with the [facility] pain clinic. The patient was advised to call immediately if he has any concerning symptoms in the interval. The patient voices understanding of current disease status and treatment options and is in agreement with the current care plan. All questions were answered. The patient knows to call the clinic with any problems, questions or concerns. We can certainly see the patient much sooner if necessary.  Disclaimer: This note was dictated with voice recognition software. Similar sounding words can inadvertently be transcribed and may not be corrected upon review.

## 2019-09-18 ENCOUNTER — Telehealth: Payer: Self-pay | Admitting: Internal Medicine

## 2019-09-18 NOTE — Telephone Encounter (Signed)
Scheduled per los. Called and left msg.

## 2019-10-01 ENCOUNTER — Inpatient Hospital Stay: Payer: Medicare Other

## 2019-10-01 ENCOUNTER — Other Ambulatory Visit: Payer: Self-pay

## 2019-10-01 ENCOUNTER — Inpatient Hospital Stay (HOSPITAL_BASED_OUTPATIENT_CLINIC_OR_DEPARTMENT_OTHER): Payer: Medicare Other | Admitting: Physician Assistant

## 2019-10-01 VITALS — BP 134/70 | HR 77 | Temp 98.2°F | Resp 17 | Ht 73.0 in | Wt 334.8 lb

## 2019-10-01 DIAGNOSIS — Z95828 Presence of other vascular implants and grafts: Secondary | ICD-10-CM

## 2019-10-01 DIAGNOSIS — Z5112 Encounter for antineoplastic immunotherapy: Secondary | ICD-10-CM | POA: Diagnosis not present

## 2019-10-01 DIAGNOSIS — C3491 Malignant neoplasm of unspecified part of right bronchus or lung: Secondary | ICD-10-CM | POA: Diagnosis not present

## 2019-10-01 LAB — CMP (CANCER CENTER ONLY)
ALT: 20 U/L (ref 0–44)
AST: 16 U/L (ref 15–41)
Albumin: 3.9 g/dL (ref 3.5–5.0)
Alkaline Phosphatase: 102 U/L (ref 38–126)
Anion gap: 10 (ref 5–15)
BUN: 12 mg/dL (ref 8–23)
CO2: 26 mmol/L (ref 22–32)
Calcium: 9 mg/dL (ref 8.9–10.3)
Chloride: 104 mmol/L (ref 98–111)
Creatinine: 0.83 mg/dL (ref 0.61–1.24)
GFR, Est AFR Am: >60 mL/min (ref >60)
GFR, Estimated: >60 mL/min (ref >60)
Glucose, Bld: 100 mg/dL — ABNORMAL HIGH (ref 70–99)
Potassium: 4.4 mmol/L (ref 3.5–5.1)
Sodium: 140 mmol/L (ref 135–145)
Total Bilirubin: 0.6 mg/dL (ref 0.3–1.2)
Total Protein: 7.3 g/dL (ref 6.5–8.1)

## 2019-10-01 LAB — CBC WITH DIFFERENTIAL (CANCER CENTER ONLY)
Abs Immature Granulocytes: 0.04 10*3/uL (ref 0.00–0.07)
Basophils Absolute: 0 10*3/uL (ref 0.0–0.1)
Basophils Relative: 0 %
Eosinophils Absolute: 0.4 10*3/uL (ref 0.0–0.5)
Eosinophils Relative: 5 %
HCT: 43.4 % (ref 39.0–52.0)
Hemoglobin: 13.7 g/dL (ref 13.0–17.0)
Immature Granulocytes: 1 %
Lymphocytes Relative: 19 %
Lymphs Abs: 1.3 10*3/uL (ref 0.7–4.0)
MCH: 30 pg (ref 26.0–34.0)
MCHC: 31.6 g/dL (ref 30.0–36.0)
MCV: 95 fL (ref 80.0–100.0)
Monocytes Absolute: 0.7 10*3/uL (ref 0.1–1.0)
Monocytes Relative: 10 %
Neutro Abs: 4.4 10*3/uL (ref 1.7–7.7)
Neutrophils Relative %: 65 %
Platelet Count: 269 10*3/uL (ref 150–400)
RBC: 4.57 MIL/uL (ref 4.22–5.81)
RDW: 13.8 % (ref 11.5–15.5)
WBC Count: 6.8 10*3/uL (ref 4.0–10.5)
nRBC: 0 % (ref 0.0–0.2)

## 2019-10-01 LAB — TSH: TSH: 1.083 u[IU]/mL (ref 0.320–4.118)

## 2019-10-01 MED ORDER — SODIUM CHLORIDE 0.9 % IV SOLN
10.6000 mg/kg | Freq: Once | INTRAVENOUS | Status: AC
  Administered 2019-10-01: 1500 mg via INTRAVENOUS
  Filled 2019-10-01: qty 30

## 2019-10-01 MED ORDER — SODIUM CHLORIDE 0.9 % IV SOLN
Freq: Once | INTRAVENOUS | Status: AC
  Administered 2019-10-01: 12:00:00 via INTRAVENOUS
  Filled 2019-10-01: qty 250

## 2019-10-01 MED ORDER — SODIUM CHLORIDE 0.9% FLUSH
10.0000 mL | INTRAVENOUS | Status: DC | PRN
  Administered 2019-10-01: 10 mL
  Filled 2019-10-01: qty 10

## 2019-10-01 MED ORDER — HEPARIN SOD (PORK) LOCK FLUSH 100 UNIT/ML IV SOLN
500.0000 [IU] | Freq: Once | INTRAVENOUS | Status: AC | PRN
  Administered 2019-10-01: 500 [IU]
  Filled 2019-10-01: qty 5

## 2019-10-01 MED ORDER — SODIUM CHLORIDE 0.9% FLUSH
10.0000 mL | Freq: Once | INTRAVENOUS | Status: AC
  Administered 2019-10-01: 10:00:00 10 mL
  Filled 2019-10-01: qty 10
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
## 2019-10-01 NOTE — Patient Instructions (Signed)
Durvalumab injection What is this medicine? DURVALUMAB (dur VAL ue mab) is a monoclonal antibody. It is used to treat urothelial cancer and lung cancer. This medicine may be used for other purposes; ask your health care provider or pharmacist if you have questions. COMMON BRAND NAME(S): IMFINZI What should I tell my health care provider before I take this medicine? They need to know if you have any of these conditions:  diabetes  immune system problems  infection  inflammatory bowel disease  kidney disease  liver disease  lung or breathing disease  lupus  organ transplant  stomach or intestine problems  thyroid disease  an unusual or allergic reaction to durvalumab, other medicines, foods, dyes, or preservatives  pregnant or trying to get pregnant  breast-feeding How should I use this medicine? This medicine is for infusion into a vein. It is given by a health care professional in a hospital or clinic setting. A special MedGuide will be given to you before each treatment. Be sure to read this information carefully each time. Talk to your pediatrician regarding the use of this medicine in children. Special care may be needed. Overdosage: If you think you have taken too much of this medicine contact a poison control center or emergency room at once. NOTE: This medicine is only for you. Do not share this medicine with others. What if I miss a dose? It is important not to miss your dose. Call your doctor or health care professional if you are unable to keep an appointment. What may interact with this medicine? Interactions have not been studied. This list may not describe all possible interactions. Give your health care provider a list of all the medicines, herbs, non-prescription drugs, or dietary supplements you use. Also tell them if you smoke, drink alcohol, or use illegal drugs. Some items may interact with your medicine. What should I watch for while using this  medicine? This drug may make you feel generally unwell. Continue your course of treatment even though you feel ill unless your doctor tells you to stop. You may need blood work done while you are taking this medicine. Do not become pregnant while taking this medicine or for 3 months after stopping it. Women should inform their doctor if they wish to become pregnant or think they might be pregnant. There is a potential for serious side effects to an unborn child. Talk to your health care professional or pharmacist for more information. Do not breast-feed an infant while taking this medicine or for 3 months after stopping it. What side effects may I notice from receiving this medicine? Side effects that you should report to your doctor or health care professional as soon as possible:  allergic reactions like skin rash, itching or hives, swelling of the face, lips, or tongue  black, tarry stools  bloody or watery diarrhea  breathing problems  change in emotions or moods  change in sex drive  changes in vision  chest pain or chest tightness  chills  confusion  cough  facial flushing  fever  headache  signs and symptoms of high blood sugar such as dizziness; dry mouth; dry skin; fruity breath; nausea; stomach pain; increased hunger or thirst; increased urination  signs and symptoms of liver injury like dark yellow or brown urine; general ill feeling or flu-like symptoms; light-colored stools; loss of appetite; nausea; right upper belly pain; unusually weak or tired; yellowing of the eyes or skin  stomach pain  trouble passing urine or change in   the amount of urine  weight gain or weight loss Side effects that usually do not require medical attention (report these to your doctor or health care professional if they continue or are bothersome):  bone pain  constipation  loss of appetite  muscle pain  nausea  swelling of the ankles, feet, hands  tiredness This list  may not describe all possible side effects. Call your doctor for medical advice about side effects. You may report side effects to FDA at 1-800-FDA-1088. Where should I keep my medicine? This drug is given in a hospital or clinic and will not be stored at home. NOTE: This sheet is a summary. It may not cover all possible information. If you have questions about this medicine, talk to your doctor, pharmacist, or health care provider.  2020 Elsevier/Gold Standard (2017-01-08 19:25:04)  Coronavirus (COVID-19) Are you at risk?  Are you at risk for the Coronavirus (COVID-19)?  To be considered HIGH RISK for Coronavirus (COVID-19), you have to meet the following criteria:  . Traveled to [place], [place], [place], [place] or [place]; or in the [place] to [place], [place], [place], or [place]; and have fever, cough, and shortness of breath within the last 2 weeks of travel OR . Been in close contact with a person diagnosed with COVID-19 within the last 2 weeks and have fever, cough, and shortness of breath . IF YOU DO NOT MEET THESE CRITERIA, YOU ARE CONSIDERED LOW RISK FOR COVID-19.  What to do if you are HIGH RISK for COVID-19?  . If you are having a medical emergency, call 911. . Seek medical care right away. Before you go to a doctor's office, urgent care or emergency department, call ahead and tell them about your recent travel, contact with someone diagnosed with COVID-19, and your symptoms. You should receive instructions from your physician's office regarding next steps of care.  . When you arrive at healthcare provider, tell the healthcare staff immediately you have returned from visiting [place], [place], [place], [place] or [place]; or traveled in the [place] to [place], [place], [place], or [place]; in the last two weeks or you have been in close contact with a person diagnosed with COVID-19 in the last 2 weeks.   . Tell the health care staff about your symptoms:  fever, cough and shortness of breath. . After you have been seen by a medical provider, you will be either: o Tested for (COVID-19) and discharged home on quarantine except to seek medical care if symptoms worsen, and asked to  - Stay home and avoid contact with others until you get your results (4-5 days)  - Avoid travel on public transportation if possible (such as bus, train, or airplane) or o Sent to the Emergency Department by EMS for evaluation, COVID-19 testing, and possible admission depending on your condition and test results.  What to do if you are LOW RISK for COVID-19?  Reduce your risk of any infection by using the same precautions used for avoiding the common cold or flu:  . Wash your hands often with soap and warm water for at least 20 seconds.  If soap and water are not readily available, use an alcohol-based hand sanitizer with at least 60% alcohol.  . If coughing or sneezing, cover your mouth and nose by coughing or sneezing into the elbow areas of your shirt or coat, into a tissue or into your sleeve (not your hands). . Avoid shaking hands with others   and consider head nods or verbal greetings only. . Avoid touching your eyes, nose, or mouth with unwashed hands.  . Avoid close contact with people who are sick. . Avoid places or events with large numbers of people in one location, like concerts or sporting events. . Carefully consider travel plans you have or are making. . If you are planning any travel outside or inside the [place], visit the CDC's Travelers' Health webpage for the latest health notices. . If you have some symptoms but not all symptoms, continue to monitor at home and seek medical attention if your symptoms worsen. . If you are having a medical emergency, call 911.   ADDITIONAL HEALTHCARE OPTIONS FOR PATIENTS  [facility] / e-Visit: [email or web address]         [facility]: [phone]  [facility]: [phone]                   [facility]: [phone]   

## 2019-10-02 ENCOUNTER — Telehealth: Payer: Self-pay | Admitting: Physician Assistant

## 2019-10-02 NOTE — Telephone Encounter (Signed)
Scheduled per los. Called and left msg. Patient already has upcoming appts

## 2019-10-06 ENCOUNTER — Other Ambulatory Visit: Payer: Self-pay | Admitting: Pulmonary Disease

## 2019-10-06 ENCOUNTER — Other Ambulatory Visit (HOSPITAL_COMMUNITY): Payer: Self-pay | Admitting: Pulmonary Disease

## 2019-10-06 DIAGNOSIS — I739 Peripheral vascular disease, unspecified: Secondary | ICD-10-CM

## 2019-10-12 ENCOUNTER — Ambulatory Visit (HOSPITAL_COMMUNITY)
Admission: RE | Admit: 2019-10-12 | Discharge: 2019-10-12 | Disposition: A | Discharge status: Home / Self Care | Payer: Medicare Other | Source: Ambulatory Visit | Attending: Physician Assistant | Admitting: Physician Assistant

## 2019-10-12 ENCOUNTER — Other Ambulatory Visit: Payer: Self-pay

## 2019-10-12 DIAGNOSIS — C3491 Malignant neoplasm of unspecified part of right bronchus or lung: Secondary | ICD-10-CM | POA: Diagnosis not present

## 2019-10-13 ENCOUNTER — Ambulatory Visit (HOSPITAL_COMMUNITY)
Admission: RE | Admit: 2019-10-13 | Discharge: 2019-10-13 | Disposition: A | Discharge status: Home / Self Care | Payer: Medicare Other | Source: Ambulatory Visit | Attending: Pulmonary Disease | Admitting: Pulmonary Disease

## 2019-10-13 DIAGNOSIS — I739 Peripheral vascular disease, unspecified: Secondary | ICD-10-CM | POA: Insufficient documentation

## 2019-10-15 ENCOUNTER — Inpatient Hospital Stay: Payer: Medicare Other | Attending: Internal Medicine

## 2019-10-15 ENCOUNTER — Inpatient Hospital Stay: Payer: Medicare Other

## 2019-10-15 ENCOUNTER — Inpatient Hospital Stay (HOSPITAL_BASED_OUTPATIENT_CLINIC_OR_DEPARTMENT_OTHER): Payer: Medicare Other | Admitting: Internal Medicine

## 2019-10-15 ENCOUNTER — Encounter: Payer: Self-pay | Admitting: Internal Medicine

## 2019-10-15 ENCOUNTER — Other Ambulatory Visit: Payer: Self-pay

## 2019-10-15 VITALS — BP 139/71 | HR 89 | Temp 99.1°F | Resp 20 | Ht 73.0 in | Wt 331.5 lb

## 2019-10-15 DIAGNOSIS — Z79899 Other long term (current) drug therapy: Secondary | ICD-10-CM | POA: Diagnosis not present

## 2019-10-15 DIAGNOSIS — C3411 Malignant neoplasm of upper lobe, right bronchus or lung: Secondary | ICD-10-CM | POA: Diagnosis present

## 2019-10-15 DIAGNOSIS — Z95828 Presence of other vascular implants and grafts: Secondary | ICD-10-CM

## 2019-10-15 DIAGNOSIS — I10 Essential (primary) hypertension: Secondary | ICD-10-CM

## 2019-10-15 DIAGNOSIS — C3491 Malignant neoplasm of unspecified part of right bronchus or lung: Secondary | ICD-10-CM | POA: Diagnosis not present

## 2019-10-15 DIAGNOSIS — Z5112 Encounter for antineoplastic immunotherapy: Secondary | ICD-10-CM | POA: Insufficient documentation

## 2019-10-15 LAB — CBC WITH DIFFERENTIAL (CANCER CENTER ONLY)
Abs Immature Granulocytes: 0.05 10*3/uL (ref 0.00–0.07)
Basophils Absolute: 0.1 10*3/uL (ref 0.0–0.1)
Basophils Relative: 1 %
Eosinophils Absolute: 0.3 10*3/uL (ref 0.0–0.5)
Eosinophils Relative: 3 %
HCT: 44.4 % (ref 39.0–52.0)
Hemoglobin: 14.6 g/dL (ref 13.0–17.0)
Immature Granulocytes: 1 %
Lymphocytes Relative: 17 %
Lymphs Abs: 1.4 10*3/uL (ref 0.7–4.0)
MCH: 30.4 pg (ref 26.0–34.0)
MCHC: 32.9 g/dL (ref 30.0–36.0)
MCV: 92.3 fL (ref 80.0–100.0)
Monocytes Absolute: 0.7 10*3/uL (ref 0.1–1.0)
Monocytes Relative: 9 %
Neutro Abs: 5.6 10*3/uL (ref 1.7–7.7)
Neutrophils Relative %: 69 %
Platelet Count: 292 10*3/uL (ref 150–400)
RBC: 4.81 MIL/uL (ref 4.22–5.81)
RDW: 13.4 % (ref 11.5–15.5)
WBC Count: 8.1 10*3/uL (ref 4.0–10.5)
nRBC: 0 % (ref 0.0–0.2)

## 2019-10-15 LAB — CMP (CANCER CENTER ONLY)
ALT: 16 U/L (ref 0–44)
AST: 15 U/L (ref 15–41)
Albumin: 4.1 g/dL (ref 3.5–5.0)
Alkaline Phosphatase: 107 U/L (ref 38–126)
Anion gap: 8 (ref 5–15)
BUN: 11 mg/dL (ref 8–23)
CO2: 28 mmol/L (ref 22–32)
Calcium: 9.5 mg/dL (ref 8.9–10.3)
Chloride: 104 mmol/L (ref 98–111)
Creatinine: 0.88 mg/dL (ref 0.61–1.24)
GFR, Est AFR Am: >60 mL/min (ref >60)
GFR, Estimated: >60 mL/min (ref >60)
Glucose, Bld: 102 mg/dL — ABNORMAL HIGH (ref 70–99)
Potassium: 4.3 mmol/L (ref 3.5–5.1)
Sodium: 140 mmol/L (ref 135–145)
Total Bilirubin: 0.5 mg/dL (ref 0.3–1.2)
Total Protein: 7.6 g/dL (ref 6.5–8.1)

## 2019-10-15 MED ORDER — SODIUM CHLORIDE 0.9% FLUSH
10.0000 mL | Freq: Once | INTRAVENOUS | Status: AC
  Administered 2019-10-15: 10 mL
  Filled 2019-10-15: qty 10

## 2019-10-15 MED ORDER — HEPARIN SOD (PORK) LOCK FLUSH 100 UNIT/ML IV SOLN
500.0000 [IU] | Freq: Once | INTRAVENOUS | Status: AC | PRN
  Administered 2019-10-15: 500 [IU]
  Filled 2019-10-15: qty 5

## 2019-10-15 MED ORDER — SODIUM CHLORIDE 0.9 % IV SOLN
10.6000 mg/kg | Freq: Once | INTRAVENOUS | Status: AC
  Administered 2019-10-15: 1500 mg via INTRAVENOUS
  Filled 2019-10-15: qty 30

## 2019-10-15 MED ORDER — SODIUM CHLORIDE 0.9 % IV SOLN
Freq: Once | INTRAVENOUS | Status: AC
  Administered 2019-10-15: 10:00:00 via INTRAVENOUS
  Filled 2019-10-15: qty 250

## 2019-10-15 MED ORDER — SODIUM CHLORIDE 0.9% FLUSH
10.0000 mL | INTRAVENOUS | Status: DC | PRN
  Administered 2019-10-15: 10 mL
  Filled 2019-10-15: qty 10

## 2019-10-15 NOTE — Progress Notes (Signed)
[place] [phone]   Fax:([phone]  OFFICE PROGRESS NOTE  [name], MD [place] [address]  DIAGNOSIS: stage IIIA (T2b, N2, M0) non-small cell lung cancer, squamous cell carcinoma presented with right upper lobe lung mass in addition to right paratracheal lymphadenopathy diagnosed in December 2019. The patient also has a hypermetabolic lesion in the left parotid gland that need further evaluation.  PRIOR THERAPY:  concurrent chemoradiation with weekly carboplatin for AUC of 2 and paclitaxel 45 mg/M2.  First dose started on 12/02/2018. Status post 6 cycles.  Last cycle was given on January 05, 2019 the patient is receiving radiation closer to home in [place], [place] completed January 12, 2019.   CURRENT THERAPY:  Consolidation treatment with immunotherapy with Imfinzi 10 mg/KG IV every 2 weeks.  First dose was given February 05, 2019. Status post 18 cycles.  INTERVAL HISTORY: Alexander Hatfield. 63 y.o. male returns to the clinic today for follow-up visit.  His wife was available by phone.  The patient is feeling fine today with no concerning complaints except for mild shortness of breath with exertion.  He denied having any chest pain, cough or hemoptysis.  He denied having any fever or chills.  He has no nausea, vomiting, diarrhea or constipation.  He has no headache or visual changes.  He has been tolerating this treatment with consolidation immunotherapy fairly well.  He had repeat CT scan of the chest performed recently and he is here for evaluation and discussion of his discuss results.  MEDICAL HISTORY: Past Medical History:  Diagnosis Date  . Arthritis   . Chronic back pain   . Chronic back pain   . Headache   . HOH (hard of hearing)   . Hypertension   . Lung mass   . Pre-diabetes   . Sleep apnea    wears cpap  . Wears glasses   . Wears partial dentures     ALLERGIES:  is allergic to iohexol.   MEDICATIONS:  Current Outpatient Medications  Medication Sig Dispense Refill  . albuterol (PROVENTIL) (2.5 MG/3ML) 0.083% nebulizer solution VVN QID PRN    . amLODipine (NORVASC) 10 MG tablet Take 10 mg by mouth daily.     . baclofen (LIORESAL) 10 MG tablet Take 10 mg by mouth 3 (three) times daily.    Marland Kitchen BELBUCA 300 MCG FILM Take 1 strip by mouth 2 (two) times daily.    . furosemide (LASIX) 20 MG tablet Take 20 mg by mouth daily.     Marland Kitchen gabapentin (NEURONTIN) 300 MG capsule Take 600 mg by mouth 3 (three) times daily. Pt states taking 3Tablets- 345m tablet TID    . guaifenesin (ROBITUSSIN) 100 MG/5ML syrup Take 200 mg by mouth 3 (three) times daily as needed for cough.    . lidocaine-prilocaine (EMLA) cream Apply 1 application topically as needed. 30 g 0  . losartan (COZAAR) 100 MG tablet Take 100 mg by mouth daily.     . magic mouthwash w/lidocaine SOLN Take by mouth.    . meloxicam (MOBIC) 15 MG tablet Take 15 mg by mouth daily.     .Marland KitchenNARCAN 4 MG/0.1ML LIQD nasal spray kit Place 1 spray into the nose See admin instructions. Administer a single spray in one nostril upon signs of opioid overdose.  Call 911.  Repeat after 3 minutes if no response.    . oxyCODONE-acetaminophen (PERCOCET) 10-325 MG tablet Take 1 tablet by mouth  4 (four) times daily as needed.    . potassium chloride SA (K-DUR,KLOR-CON) 20 MEQ tablet Take 20 mEq by mouth every evening.     . prochlorperazine (COMPAZINE) 10 MG tablet Take 1 tablet (10 mg total) by mouth every 6 (six) hours as needed for nausea or vomiting. 30 tablet 0  . Umeclidinium-Vilanterol (ANORO ELLIPTA IN) Inhale into the lungs.     No current facility-administered medications for this visit.     SURGICAL HISTORY:  Past Surgical History:  Procedure Laterality Date  . ANTERIOR CERVICAL DECOMP/DISCECTOMY FUSION N/A 10/27/2018   Procedure: ANTERIOR CERVICAL THREE-FOUR, FOUR-FIVE DECOMPRESSION/DISCECTOMY FUSION TWO LEVELS;  Surgeon: Kary Kos, MD;   Location: [facility];  Service: Neurosurgery;  Laterality: N/A;   ANTERIOR CERVICAL THREE-FOUR, FOUR-FIVE DECOMPRESSION/DISCECTOMY FUSION TWO LEVELS  . BACK SURGERY    . IR IMAGING GUIDED PORT INSERTION  11/27/2018  . MULTIPLE TOOTH EXTRACTIONS    . SPINAL CORD STIMULATOR IMPLANT    . VIDEO BRONCHOSCOPY N/A 10/29/2018   Procedure: VIDEO BRONCHOSCOPY;  Surgeon: Laurin Coder, MD;  [facility];  Service: Thoracic;  Laterality: N/A;  . VIDEO BRONCHOSCOPY WITH ENDOBRONCHIAL ULTRASOUND N/A 10/27/2018   Procedure: VIDEO BRONCHOSCOPY WITH ENDOBRONCHIAL ULTRASOUND;  Surgeon: Laurin Coder, MD;  Location: [facility];  Service: Thoracic;  Laterality: N/A;  . VIDEO BRONCHOSCOPY WITH ENDOBRONCHIAL ULTRASOUND N/A 10/29/2018   Procedure: VIDEO BRONCHOSCOPY WITH ENDOBRONCHIAL ULTRASOUND;  Surgeon: Laurin Coder, MD;  [facility];  Service: Thoracic;  Laterality: N/A;    REVIEW OF SYSTEMS:  Constitutional: positive for fatigue Eyes: negative Ears, nose, mouth, throat, and face: negative Respiratory: positive for dyspnea on exertion Cardiovascular: negative Gastrointestinal: negative Genitourinary:negative Integument/breast: negative Hematologic/lymphatic: negative Musculoskeletal:negative Neurological: negative Behavioral/Psych: negative Endocrine: negative Allergic/Immunologic: negative   PHYSICAL EXAMINATION: General appearance: alert, cooperative and no distress Head: Normocephalic, without obvious abnormality, atraumatic Neck: no adenopathy, no JVD, supple, symmetrical, trachea midline and thyroid not enlarged, symmetric, no tenderness/mass/nodules Lymph nodes: Cervical, supraclavicular, and axillary nodes normal. Resp: clear to auscultation bilaterally Back: symmetric, no curvature. ROM normal. No CVA tenderness. Cardio: regular rate and rhythm, S1, S2 normal, no murmur, click, rub or gallop GI: soft, non-tender; bowel sounds normal; no masses,  no organomegaly Extremities:  extremities normal, atraumatic, no cyanosis or edema Neurologic: Alert and oriented X 3, normal strength and tone. Normal symmetric reflexes. Normal coordination and gait  ECOG PERFORMANCE STATUS: 1 - Symptomatic but completely ambulatory  Blood pressure 139/71, pulse 89, temperature 99.1 F (37.3 C), temperature source Temporal, resp. rate 20, height _0  (1.854 m), weight (!) 331 lb 8 oz (150.4 kg), SpO2 95 %.  LABORATORY DATA: Lab Results  Component Value Date   WBC 8.1 10/15/2019   HGB 14.6 10/15/2019   HCT 44.4 10/15/2019   MCV 92.3 10/15/2019   PLT 292 10/15/2019      Chemistry      Component Value Date/Time   NA 140 10/01/2019 0927   K 4.4 10/01/2019 0927   CL 104 10/01/2019 0927   CO2 26 10/01/2019 0927   BUN 12 10/01/2019 0927   CREATININE 0.83 10/01/2019 0927      Component Value Date/Time   CALCIUM 9.0 10/01/2019 0927   ALKPHOS 102 10/01/2019 0927   AST 16 10/01/2019 0927   ALT 20 10/01/2019 0927   BILITOT 0.6 10/01/2019 0927       RADIOGRAPHIC STUDIES: Ct Chest Wo Contrast  Result Date: 10/13/2019 CLINICAL DATA:  Non-small-cell lung cancer. Status post chemo radiation. EXAM:  CT CHEST WITHOUT CONTRAST TECHNIQUE: Multidetector CT imaging of the chest was performed following the standard protocol without IV contrast. COMPARISON:  07/16/2019 FINDINGS: Cardiovascular: The heart size is normal. No substantial pericardial effusion. Coronary artery calcification is evident. Atherosclerotic calcification is noted in the wall of the thoracic aorta. Right Port-A-Cath tip is positioned in the distal SVC. Mediastinum/Nodes: 16 mm short axis right paratracheal node measured previously is stable at 16 mm today (56/2). Index subcarinal node measured previously at 11 mm is 12 mm today. No evidence for gross hilar lymphadenopathy although assessment is limited by the lack of intravenous contrast on today's study. The esophagus has normal imaging features. Similar appearance of the  3 cm right thyroid nodule. There is no axillary lymphadenopathy. Lungs/Pleura: Right parahilar volume loss and scarring is stable in the interval, compatible with prior radiation therapy. Masslike component measured previously at 2.2 x 2.8 cm measures minimally smaller today at 1.9 x 1.9 cm. Centrilobular and paraseptal emphysema evident. Scattered calcified granulomata noted in the lungs bilaterally. No substantial pleural effusion. Upper Abdomen: Similar appearance benign appearing capsular calcification in the spleen. Musculoskeletal: Thoracic spinal stimulator device evident. No worrisome lytic or sclerotic osseous abnormality. Similar appearance bilateral gynecomastia. IMPRESSION: 1. No substantial change in the right parahilar radiation scarring. Previously measured lung lesion is not well demonstrated due to obscuration by the scarring. 2. Stable stable borderline mediastinal lymphadenopathy. 3. Aortic Atherosclerosis (ICD10-I70.0) and Emphysema (ICD10-J43.9). Electronically Signed   By: Misty Stanley M.D.   On: 10/13/2019 07:40    ASSESSMENT AND PLAN: This is a very pleasant 63 years old white male with a stage IIIa non-small cell lung cancer, squamous cell carcinoma diagnosed in December 2019. The patient completed a course of treatment with concurrent chemoradiation with weekly carboplatin and paclitaxel status post 6 cycles.  He had partial response to this treatment. He was a started on consolidation treatment with Imfinzi status post 18 cycles. The patient has been tolerating this treatment well with no concerning adverse effects. He had repeat CT scan of the chest performed recently.  I personally and independently reviewed the scans and discussed the results with the patient today. Has a scan showed no concerning findings for disease progression. I recommended for the patient to continue his current consolidation treatment with immunotherapy and he will proceed with cycle #19 today. I will  see him back for follow-up visit in 2 weeks for evaluation before starting cycle #20. For the chronic back pain he will continue his follow-up visit and evaluation with the [facility] pain clinic. The patient was advised to call immediately if he has any concerning symptoms in the interval. The patient voices understanding of current disease status and treatment options and is in agreement with the current care plan. All questions were answered. The patient knows to call the clinic with any problems, questions or concerns. We can certainly see the patient much sooner if necessary.  Disclaimer: This note was dictated with voice recognition software. Similar sounding words can inadvertently be transcribed and may not be corrected upon review.

## 2019-10-15 NOTE — Patient Instructions (Signed)
[facility] Discharge Instructions for Patients Receiving Chemotherapy  Today you received the following chemotherapy agents: Imfinzi.  To help prevent nausea and vomiting after your treatment, we encourage you to take your nausea medication as directed.   If you develop nausea and vomiting that is not controlled by your nausea medication, call the clinic.   BELOW ARE SYMPTOMS THAT SHOULD BE REPORTED IMMEDIATELY:  *FEVER GREATER THAN 100.5 F  *CHILLS WITH OR WITHOUT FEVER  NAUSEA AND VOMITING THAT IS NOT CONTROLLED WITH YOUR NAUSEA MEDICATION  *UNUSUAL SHORTNESS OF BREATH  *UNUSUAL BRUISING OR BLEEDING  TENDERNESS IN MOUTH AND THROAT WITH OR WITHOUT PRESENCE OF ULCERS  *URINARY PROBLEMS  *BOWEL PROBLEMS  UNUSUAL RASH Items with * indicate a potential emergency and should be followed up as soon as possible.  Feel free to call the clinic should you have any questions or concerns. The clinic phone number is [phone].  Please show the CHEMO ALERT CARD at check-in to the Emergency Department and triage nurse.   

## 2019-10-29 ENCOUNTER — Inpatient Hospital Stay: Payer: Medicare Other

## 2019-10-29 ENCOUNTER — Other Ambulatory Visit: Payer: Self-pay

## 2019-10-29 ENCOUNTER — Encounter: Payer: Self-pay | Admitting: Internal Medicine

## 2019-10-29 ENCOUNTER — Inpatient Hospital Stay (HOSPITAL_BASED_OUTPATIENT_CLINIC_OR_DEPARTMENT_OTHER): Payer: Medicare Other | Admitting: Internal Medicine

## 2019-10-29 VITALS — BP 155/79 | HR 81 | Temp 98.7°F | Resp 18 | Ht 73.0 in | Wt 340.4 lb

## 2019-10-29 DIAGNOSIS — C3491 Malignant neoplasm of unspecified part of right bronchus or lung: Secondary | ICD-10-CM

## 2019-10-29 DIAGNOSIS — Z5112 Encounter for antineoplastic immunotherapy: Secondary | ICD-10-CM

## 2019-10-29 DIAGNOSIS — Z95828 Presence of other vascular implants and grafts: Secondary | ICD-10-CM

## 2019-10-29 DIAGNOSIS — I10 Essential (primary) hypertension: Secondary | ICD-10-CM | POA: Diagnosis not present

## 2019-10-29 LAB — CMP (CANCER CENTER ONLY)
ALT: 14 U/L (ref 0–44)
AST: 15 U/L (ref 15–41)
Albumin: 3.9 g/dL (ref 3.5–5.0)
Alkaline Phosphatase: 109 U/L (ref 38–126)
Anion gap: 11 (ref 5–15)
BUN: 11 mg/dL (ref 8–23)
CO2: 28 mmol/L (ref 22–32)
Calcium: 9.3 mg/dL (ref 8.9–10.3)
Chloride: 103 mmol/L (ref 98–111)
Creatinine: 0.85 mg/dL (ref 0.61–1.24)
GFR, Est AFR Am: >60 mL/min (ref >60)
GFR, Estimated: >60 mL/min (ref >60)
Glucose, Bld: 96 mg/dL (ref 70–99)
Potassium: 4.3 mmol/L (ref 3.5–5.1)
Sodium: 142 mmol/L (ref 135–145)
Total Bilirubin: 0.4 mg/dL (ref 0.3–1.2)
Total Protein: 7.3 g/dL (ref 6.5–8.1)

## 2019-10-29 LAB — CBC WITH DIFFERENTIAL (CANCER CENTER ONLY)
Abs Immature Granulocytes: 0.05 10*3/uL (ref 0.00–0.07)
Basophils Absolute: 0 10*3/uL (ref 0.0–0.1)
Basophils Relative: 1 %
Eosinophils Absolute: 0.3 10*3/uL (ref 0.0–0.5)
Eosinophils Relative: 4 %
HCT: 43.4 % (ref 39.0–52.0)
Hemoglobin: 14.1 g/dL (ref 13.0–17.0)
Immature Granulocytes: 1 %
Lymphocytes Relative: 14 %
Lymphs Abs: 1.1 10*3/uL (ref 0.7–4.0)
MCH: 30.7 pg (ref 26.0–34.0)
MCHC: 32.5 g/dL (ref 30.0–36.0)
MCV: 94.3 fL (ref 80.0–100.0)
Monocytes Absolute: 0.8 10*3/uL (ref 0.1–1.0)
Monocytes Relative: 10 %
Neutro Abs: 5.7 10*3/uL (ref 1.7–7.7)
Neutrophils Relative %: 70 %
Platelet Count: 248 10*3/uL (ref 150–400)
RBC: 4.6 MIL/uL (ref 4.22–5.81)
RDW: 13.5 % (ref 11.5–15.5)
WBC Count: 8 10*3/uL (ref 4.0–10.5)
nRBC: 0 % (ref 0.0–0.2)

## 2019-10-29 LAB — TSH: TSH: 1.727 u[IU]/mL (ref 0.320–4.118)

## 2019-10-29 MED ORDER — SODIUM CHLORIDE 0.9% FLUSH
10.0000 mL | INTRAVENOUS | Status: DC | PRN
  Administered 2019-10-29: 10 mL
  Filled 2019-10-29: qty 10

## 2019-10-29 MED ORDER — SODIUM CHLORIDE 0.9 % IV SOLN
Freq: Once | INTRAVENOUS | Status: AC
  Filled 2019-10-29: qty 250

## 2019-10-29 MED ORDER — SODIUM CHLORIDE 0.9 % IV SOLN
10.6000 mg/kg | Freq: Once | INTRAVENOUS | Status: AC
  Administered 2019-10-29: 1500 mg via INTRAVENOUS
  Filled 2019-10-29: qty 30

## 2019-10-29 MED ORDER — HEPARIN SOD (PORK) LOCK FLUSH 100 UNIT/ML IV SOLN
500.0000 [IU] | Freq: Once | INTRAVENOUS | Status: AC | PRN
  Administered 2019-10-29: 500 [IU]
  Filled 2019-10-29: qty 5

## 2019-10-29 MED ORDER — SODIUM CHLORIDE 0.9% FLUSH
10.0000 mL | Freq: Once | INTRAVENOUS | Status: AC
  Administered 2019-10-29: 10 mL
  Filled 2019-10-29: qty 10

## 2019-10-29 NOTE — Progress Notes (Signed)
[name] Telephone:([phone]   Fax:(336) [phone]  OFFICE PROGRESS NOTE  Celene Squibb, MD [address] [place] [address]  DIAGNOSIS: stage IIIA (T2b, N2, M0) non-small cell lung cancer, squamous cell carcinoma presented with right upper lobe lung mass in addition to right paratracheal lymphadenopathy diagnosed in December 2019. The patient also has a hypermetabolic lesion in the left parotid gland that need further evaluation.  PRIOR THERAPY:  concurrent chemoradiation with weekly carboplatin for AUC of 2 and paclitaxel 45 mg/M2.  First dose started on 12/02/2018. Status post 6 cycles.  Last cycle was given on January 05, 2019 the patient is receiving radiation closer to home in [place], [place] completed January 12, 2019.   CURRENT THERAPY:  Consolidation treatment with immunotherapy with Imfinzi 10 mg/KG IV every 2 weeks.  First dose was given February 05, 2019. Status post 19 cycles.  INTERVAL HISTORY: Alexander Hatfield. 63 y.o. male returns to the clinic today for follow-up visit.  The patient is feeling fine today with no concerning complaints except for the weakness in his legs.  He had a noninvasive physiologic vascular study of his lower extremities by his primary care physician on October 15, 2019.  It was consistent with moderate underlying peripheral arterial disease.  The patient denied having any current chest pain, shortness of breath except with exertion with no cough or hemoptysis.  He denied having any fever or chills.  He has no nausea, vomiting, diarrhea or constipation.  He has no headache or visual changes.  He continues to tolerate his treatment with Imfinzi fairly well.  The patient is here today for evaluation before starting cycle #20.  MEDICAL HISTORY: Past Medical History:  Diagnosis Date   Arthritis    Chronic back pain    Chronic back pain    Headache    HOH (hard of hearing)    Hypertension    Lung mass     Pre-diabetes    Sleep apnea    wears cpap   Wears glasses    Wears partial dentures     ALLERGIES:  is allergic to iohexol.  MEDICATIONS:  Current Outpatient Medications  Medication Sig Dispense Refill   albuterol (PROVENTIL) (2.5 MG/3ML) 0.083% nebulizer solution VVN QID PRN     amLODipine (NORVASC) 10 MG tablet Take 10 mg by mouth daily.      baclofen (LIORESAL) 10 MG tablet Take 10 mg by mouth 3 (three) times daily.     furosemide (LASIX) 20 MG tablet Take 20 mg by mouth daily.      gabapentin (NEURONTIN) 300 MG capsule Take 600 mg by mouth 3 (three) times daily. Pt states taking 3Tablets- 365m tablet TID     guaifenesin (ROBITUSSIN) 100 MG/5ML syrup Take 200 mg by mouth 3 (three) times daily as needed for cough.     losartan (COZAAR) 100 MG tablet Take 100 mg by mouth daily.      meloxicam (MOBIC) 15 MG tablet Take 15 mg by mouth daily.      oxyCODONE-acetaminophen (PERCOCET) 10-325 MG tablet Take 1 tablet by mouth 4 (four) times daily as needed.     potassium chloride SA (K-DUR,KLOR-CON) 20 MEQ tablet Take 20 mEq by mouth every evening.      Umeclidinium-Vilanterol (ANORO ELLIPTA IN) Inhale into the lungs.     BELBUCA 450 MCG FILM Take by mouth 2 (two) times daily.     BREO ELLIPTA 200-25 MCG/INH  AEPB Inhale 1 puff into the lungs daily.     D3-50 1.25 MG (50000 UT) capsule Take 50,000 Units by mouth once a week.     ipratropium-albuterol (DUONEB) 0.5-2.5 (3) MG/3ML SOLN SMARTSIG:1 Vial(s) Via Nebulizer 4 Times Daily PRN     lidocaine-prilocaine (EMLA) cream Apply 1 application topically as needed. (Patient not taking: Reported on 10/29/2019) 30 g 0   magic mouthwash w/lidocaine SOLN Take by mouth.     NARCAN 4 MG/0.1ML LIQD nasal spray kit Place 1 spray into the nose See admin instructions. Administer a single spray in one nostril upon signs of opioid overdose.  Call 911.  Repeat after 3 minutes if no response.     prochlorperazine (COMPAZINE) 10 MG tablet  Take 1 tablet (10 mg total) by mouth every 6 (six) hours as needed for nausea or vomiting. (Patient not taking: Reported on 10/29/2019) 30 tablet 0   No current facility-administered medications for this visit.    SURGICAL HISTORY:  Past Surgical History:  Procedure Laterality Date   ANTERIOR CERVICAL DECOMP/DISCECTOMY FUSION N/A 10/27/2018   Procedure: ANTERIOR CERVICAL THREE-FOUR, FOUR-FIVE DECOMPRESSION/DISCECTOMY FUSION TWO LEVELS;  Surgeon: Kary Kos, MD;  Location: [facility];  Service: Neurosurgery;  Laterality: N/A;   ANTERIOR CERVICAL THREE-FOUR, FOUR-FIVE DECOMPRESSION/DISCECTOMY FUSION TWO LEVELS   BACK SURGERY     IR IMAGING GUIDED PORT INSERTION  11/27/2018   MULTIPLE TOOTH EXTRACTIONS     SPINAL CORD STIMULATOR IMPLANT     VIDEO BRONCHOSCOPY N/A 10/29/2018   Procedure: VIDEO BRONCHOSCOPY;  Surgeon: Laurin Coder, MD;  Location: [facility];  Service: Thoracic;  Laterality: N/A;   VIDEO BRONCHOSCOPY WITH ENDOBRONCHIAL ULTRASOUND N/A 10/27/2018   Procedure: VIDEO BRONCHOSCOPY WITH ENDOBRONCHIAL ULTRASOUND;  Surgeon: Laurin Coder, MD;  Location: [facility];  Service: Thoracic;  Laterality: N/A;   VIDEO BRONCHOSCOPY WITH ENDOBRONCHIAL ULTRASOUND N/A 10/29/2018   Procedure: VIDEO BRONCHOSCOPY WITH ENDOBRONCHIAL ULTRASOUND;  Surgeon: Laurin Coder, MD;  Location[facility];  Service: Thoracic;  Laterality: N/A;    REVIEW OF SYSTEMS:  A comprehensive review of systems was negative except for: Respiratory: positive for dyspnea on exertion   PHYSICAL EXAMINATION: General appearance: alert, cooperative and no distress Head: Normocephalic, without obvious abnormality, atraumatic Neck: no adenopathy, no JVD, supple, symmetrical, trachea midline and thyroid not enlarged, symmetric, no tenderness/mass/nodules Lymph nodes: Cervical, supraclavicular, and axillary nodes normal. Resp: clear to auscultation bilaterally Back: symmetric, no curvature. ROM normal. No CVA tenderness. Cardio:  regular rate and rhythm, S1, S2 normal, no murmur, click, rub or gallop GI: soft, non-tender; bowel sounds normal; no masses,  no organomegaly Extremities: extremities normal, atraumatic, no cyanosis or edema  ECOG PERFORMANCE STATUS: 1 - Symptomatic but completely ambulatory  Blood pressure (!) 155/79, pulse 81, temperature 98.7 F (37.1 C), temperature source Temporal, resp. rate 18, height 6' 1" (1.854 m), weight (!) 340 lb 6.4 oz (154.4 kg), SpO2 92 %.  LABORATORY DATA: Lab Results  Component Value Date   WBC 8.0 10/29/2019   HGB 14.1 10/29/2019   HCT 43.4 10/29/2019   MCV 94.3 10/29/2019   PLT 248 10/29/2019      Chemistry      Component Value Date/Time   NA 142 10/29/2019 1023   K 4.3 10/29/2019 1023   CL 103 10/29/2019 1023   CO2 28 10/29/2019 1023   BUN 11 10/29/2019 1023   CREATININE 0.85 10/29/2019 1023      Component Value Date/Time   CALCIUM 9.3 10/29/2019 1023   ALKPHOS  109 10/29/2019 1023   AST 15 10/29/2019 1023   ALT 14 10/29/2019 1023   BILITOT 0.4 10/29/2019 1023       RADIOGRAPHIC STUDIES: CT Chest Wo Contrast  Result Date: 10/13/2019 CLINICAL DATA:  Non-small-cell lung cancer. Status post chemo radiation. EXAM: CT CHEST WITHOUT CONTRAST TECHNIQUE: Multidetector CT imaging of the chest was performed following the standard protocol without IV contrast. COMPARISON:  07/16/2019 FINDINGS: Cardiovascular: The heart size is normal. No substantial pericardial effusion. Coronary artery calcification is evident. Atherosclerotic calcification is noted in the wall of the thoracic aorta. Right Port-A-Cath tip is positioned in the distal SVC. Mediastinum/Nodes: 16 mm short axis right paratracheal node measured previously is stable at 16 mm today (56/2). Index subcarinal node measured previously at 11 mm is 12 mm today. No evidence for gross hilar lymphadenopathy although assessment is limited by the lack of intravenous contrast on today's study. The esophagus has  normal imaging features. Similar appearance of the 3 cm right thyroid nodule. There is no axillary lymphadenopathy. Lungs/Pleura: Right parahilar volume loss and scarring is stable in the interval, compatible with prior radiation therapy. Masslike component measured previously at 2.2 x 2.8 cm measures minimally smaller today at 1.9 x 1.9 cm. Centrilobular and paraseptal emphysema evident. Scattered calcified granulomata noted in the lungs bilaterally. No substantial pleural effusion. Upper Abdomen: Similar appearance benign appearing capsular calcification in the spleen. Musculoskeletal: Thoracic spinal stimulator device evident. No worrisome lytic or sclerotic osseous abnormality. Similar appearance bilateral gynecomastia. IMPRESSION: 1. No substantial change in the right parahilar radiation scarring. Previously measured lung lesion is not well demonstrated due to obscuration by the scarring. 2. Stable stable borderline mediastinal lymphadenopathy. 3. Aortic Atherosclerosis (ICD10-I70.0) and Emphysema (ICD10-J43.9). Electronically Signed   By: Misty Stanley M.D.   On: 10/13/2019 07:40   US ARTERIAL ABI (SCREENING LOWER EXTREMITY)  Result Date: 10/15/2019 CLINICAL DATA:  63 year old male with a history of multiple back surgeries and pain when walking. Evaluate for underlying peripheral arterial disease as a source for vascular claudication. EXAM: NONINVASIVE PHYSIOLOGIC VASCULAR STUDY OF BILATERAL LOWER EXTREMITIES TECHNIQUE: Evaluation of both lower extremities were performed at rest, including calculation of ankle-brachial indices with single level Doppler, pressure and pulse volume recording. COMPARISON:  None. FINDINGS: Right ABI:  0.73 Left ABI:  0.75 Right Lower Extremity:  Biphasic arterial waveforms. Left Lower Extremity:  Biphasic arterial waveforms. 0.5-0.79 Moderate PAD IMPRESSION: Abnormal bilateral resting ankle-brachial indices consistent with at least moderate underlying peripheral arterial  disease. CT arteriogram with runoff could further evaluate if clinically warranted. Signed, Criselda Peaches, MD, [facility] Radiology Electronically Signed   By: Jacqulynn Cadet M.D.   On: 10/15/2019 09:56    ASSESSMENT AND PLAN: This is a very pleasant 63 years old white male with a stage IIIa non-small cell lung cancer, squamous cell carcinoma diagnosed in December 2019. The patient completed a course of treatment with concurrent chemoradiation with weekly carboplatin and paclitaxel status post 6 cycles.  He had partial response to this treatment. He was a started on consolidation treatment with Imfinzi status post 19 cycles. The patient has been tolerating this treatment well with no concerning adverse effects. I recommended for him to proceed with cycle #20 today as planned. For the peripheral arterial disease, he is followed by his primary care physician and may need referral to vascular surgery. For the chronic back pain he will continue his follow-up visit and evaluation with the [facility] pain clinic. The patient will  come back for follow-up visit in 2 weeks for evaluation before the next cycle of his treatment. He was advised to call immediately if he has any concerning symptoms in the interval. The patient voices understanding of current disease status and treatment options and is in agreement with the current care plan. All questions were answered. The patient knows to call the clinic with any problems, questions or concerns. We can certainly see the patient much sooner if necessary.  Disclaimer: This note was dictated with voice recognition software. Similar sounding words can inadvertently be transcribed and may not be corrected upon review.

## 2019-10-29 NOTE — Patient Instructions (Signed)
[facility] Discharge Instructions for Patients Receiving Chemotherapy  Today you received the following chemotherapy agents: Imfinzi.  To help prevent nausea and vomiting after your treatment, we encourage you to take your nausea medication as directed.   If you develop nausea and vomiting that is not controlled by your nausea medication, call the clinic.   BELOW ARE SYMPTOMS THAT SHOULD BE REPORTED IMMEDIATELY:  *FEVER GREATER THAN 100.5 F  *CHILLS WITH OR WITHOUT FEVER  NAUSEA AND VOMITING THAT IS NOT CONTROLLED WITH YOUR NAUSEA MEDICATION  *UNUSUAL SHORTNESS OF BREATH  *UNUSUAL BRUISING OR BLEEDING  TENDERNESS IN MOUTH AND THROAT WITH OR WITHOUT PRESENCE OF ULCERS  *URINARY PROBLEMS  *BOWEL PROBLEMS  UNUSUAL RASH Items with * indicate a potential emergency and should be followed up as soon as possible.  Feel free to call the clinic should you have any questions or concerns. The clinic phone number is [phone].  Please show the CHEMO ALERT CARD at check-in to the Emergency Department and triage nurse.   
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
## 2019-11-12 ENCOUNTER — Other Ambulatory Visit: Payer: Self-pay

## 2019-11-12 ENCOUNTER — Inpatient Hospital Stay (HOSPITAL_BASED_OUTPATIENT_CLINIC_OR_DEPARTMENT_OTHER): Payer: Medicare Other | Admitting: Internal Medicine

## 2019-11-12 ENCOUNTER — Encounter: Payer: Self-pay | Admitting: Internal Medicine

## 2019-11-12 ENCOUNTER — Inpatient Hospital Stay: Payer: Medicare Other

## 2019-11-12 ENCOUNTER — Encounter: Payer: Self-pay | Admitting: *Deleted

## 2019-11-12 VITALS — BP 148/78 | HR 93 | Temp 98.3°F | Resp 18 | Ht 73.0 in | Wt 342.9 lb

## 2019-11-12 DIAGNOSIS — C3491 Malignant neoplasm of unspecified part of right bronchus or lung: Secondary | ICD-10-CM

## 2019-11-12 DIAGNOSIS — Z5112 Encounter for antineoplastic immunotherapy: Secondary | ICD-10-CM | POA: Diagnosis not present

## 2019-11-12 DIAGNOSIS — Z95828 Presence of other vascular implants and grafts: Secondary | ICD-10-CM

## 2019-11-12 LAB — CBC WITH DIFFERENTIAL (CANCER CENTER ONLY)
Abs Immature Granulocytes: 0.04 10*3/uL (ref 0.00–0.07)
Basophils Absolute: 0 10*3/uL (ref 0.0–0.1)
Basophils Relative: 1 %
Eosinophils Absolute: 0.3 10*3/uL (ref 0.0–0.5)
Eosinophils Relative: 3 %
HCT: 42.5 % (ref 39.0–52.0)
Hemoglobin: 13.4 g/dL (ref 13.0–17.0)
Immature Granulocytes: 1 %
Lymphocytes Relative: 16 %
Lymphs Abs: 1.4 10*3/uL (ref 0.7–4.0)
MCH: 30 pg (ref 26.0–34.0)
MCHC: 31.5 g/dL (ref 30.0–36.0)
MCV: 95.3 fL (ref 80.0–100.0)
Monocytes Absolute: 0.9 10*3/uL (ref 0.1–1.0)
Monocytes Relative: 10 %
Neutro Abs: 6 10*3/uL (ref 1.7–7.7)
Neutrophils Relative %: 69 %
Platelet Count: 263 10*3/uL (ref 150–400)
RBC: 4.46 MIL/uL (ref 4.22–5.81)
RDW: 14 % (ref 11.5–15.5)
WBC Count: 8.6 10*3/uL (ref 4.0–10.5)
nRBC: 0 % (ref 0.0–0.2)

## 2019-11-12 LAB — CMP (CANCER CENTER ONLY)
ALT: 15 U/L (ref 0–44)
AST: 14 U/L — ABNORMAL LOW (ref 15–41)
Albumin: 3.8 g/dL (ref 3.5–5.0)
Alkaline Phosphatase: 100 U/L (ref 38–126)
Anion gap: 10 (ref 5–15)
BUN: 14 mg/dL (ref 8–23)
CO2: 28 mmol/L (ref 22–32)
Calcium: 9.1 mg/dL (ref 8.9–10.3)
Chloride: 104 mmol/L (ref 98–111)
Creatinine: 0.85 mg/dL (ref 0.61–1.24)
GFR, Est AFR Am: >60 mL/min (ref >60)
GFR, Estimated: >60 mL/min (ref >60)
Glucose, Bld: 95 mg/dL (ref 70–99)
Potassium: 4.5 mmol/L (ref 3.5–5.1)
Sodium: 142 mmol/L (ref 135–145)
Total Bilirubin: 0.4 mg/dL (ref 0.3–1.2)
Total Protein: 7.1 g/dL (ref 6.5–8.1)

## 2019-11-12 MED ORDER — HEPARIN SOD (PORK) LOCK FLUSH 100 UNIT/ML IV SOLN
500.0000 [IU] | Freq: Once | INTRAVENOUS | Status: AC | PRN
  Administered 2019-11-12: 500 [IU]
  Filled 2019-11-12: qty 5

## 2019-11-12 MED ORDER — SODIUM CHLORIDE 0.9% FLUSH
10.0000 mL | INTRAVENOUS | Status: DC | PRN
  Administered 2019-11-12: 10 mL
  Filled 2019-11-12: qty 10

## 2019-11-12 MED ORDER — SODIUM CHLORIDE 0.9 % IV SOLN
Freq: Once | INTRAVENOUS | Status: AC
  Filled 2019-11-12: qty 250

## 2019-11-12 MED ORDER — SODIUM CHLORIDE 0.9% FLUSH
10.0000 mL | Freq: Once | INTRAVENOUS | Status: AC
  Administered 2019-11-12: 10 mL
  Filled 2019-11-12: qty 10

## 2019-11-12 MED ORDER — SODIUM CHLORIDE 0.9 % IV SOLN
10.6000 mg/kg | Freq: Once | INTRAVENOUS | Status: AC
  Administered 2019-11-12: 1500 mg via INTRAVENOUS
  Filled 2019-11-12: qty 30

## 2019-11-12 NOTE — Patient Instructions (Signed)
[facility] Discharge Instructions for Patients Receiving Chemotherapy  Today you received the following chemotherapy agents Durvalumab (IMFINZI).  To help prevent nausea and vomiting after your treatment, we encourage you to take your nausea medication as prescribed.   If you develop nausea and vomiting that is not controlled by your nausea medication, call the clinic.   BELOW ARE SYMPTOMS THAT SHOULD BE REPORTED IMMEDIATELY:  *FEVER GREATER THAN 100.5 F  *CHILLS WITH OR WITHOUT FEVER  NAUSEA AND VOMITING THAT IS NOT CONTROLLED WITH YOUR NAUSEA MEDICATION  *UNUSUAL SHORTNESS OF BREATH  *UNUSUAL BRUISING OR BLEEDING  TENDERNESS IN MOUTH AND THROAT WITH OR WITHOUT PRESENCE OF ULCERS  *URINARY PROBLEMS  *BOWEL PROBLEMS  UNUSUAL RASH Items with * indicate a potential emergency and should be followed up as soon as possible.  Feel free to call the clinic should you have any questions or concerns. The clinic phone number is [phone].  Please show the [facility] at check-in to the Emergency Department and triage nurse.  Coronavirus (COVID-19) Are you at risk?  Are you at risk for the Coronavirus (COVID-19)?  To be considered HIGH RISK for Coronavirus (COVID-19), you have to meet the following criteria:  . Traveled to [place], [place], [place], [place] or [place]; or in the [place] to [place], [place], [place], or [place]; and have fever, cough, and shortness of breath within the last 2 weeks of travel OR . Been in close contact with a person diagnosed with COVID-19 within the last 2 weeks and have fever, cough, and shortness of breath . IF YOU DO NOT MEET THESE CRITERIA, YOU ARE CONSIDERED LOW RISK FOR COVID-19.  What to do if you are HIGH RISK for COVID-19?  Marland Kitchen If you are having a medical emergency, call 911. . Seek medical care right away. Before you go to a doctor's office, urgent care or emergency department, call ahead and tell them  about your recent travel, contact with someone diagnosed with COVID-19, and your symptoms. You should receive instructions from your physician's office regarding next steps of care.  . When you arrive at healthcare provider, tell the healthcare staff immediately you have returned from visiting [place], [place], [place], [place] or [place]; or traveled in the [place] to [place], [place], [place], or [place]; in the last two weeks or you have been in close contact with a person diagnosed with COVID-19 in the last 2 weeks.   . Tell the health care staff about your symptoms: fever, cough and shortness of breath. . After you have been seen by a medical provider, you will be either: o Tested for (COVID-19) and discharged home on quarantine except to seek medical care if symptoms worsen, and asked to  - Stay home and avoid contact with others until you get your results (4-5 days)  - Avoid travel on public transportation if possible (such as bus, train, or airplane) or o Sent to the Emergency Department by EMS for evaluation, COVID-19 testing, and possible admission depending on your condition and test results.  What to do if you are LOW RISK for COVID-19?  Reduce your risk of any infection by using the same precautions used for avoiding the common cold or flu:  Marland Kitchen Wash your hands often with soap and warm water for at least 20 seconds.  If soap and water are not readily available, use an alcohol-based hand sanitizer with at least 60% alcohol.  . If coughing or  sneezing, cover your mouth and nose by coughing or sneezing into the elbow areas of your shirt or coat, into a tissue or into your sleeve (not your hands). . Avoid shaking hands with others and consider head nods or verbal greetings only. . Avoid touching your eyes, nose, or mouth with unwashed hands.  . Avoid close contact with people who are sick. . Avoid places or events with large numbers of people in one location, like concerts or  sporting events. . Carefully consider travel plans you have or are making. . If you are planning any travel outside or inside the [place], visit the CDC's Travelers' Health webpage for the latest health notices. . If you have some symptoms but not all symptoms, continue to monitor at home and seek medical attention if your symptoms worsen. . If you are having a medical emergency, call 911.   [facility] / e-Visit: [email or web address]         [facility]: [facility]: [phone]                   [facility]: [phone]

## 2019-11-12 NOTE — Progress Notes (Signed)
Oncology Nurse Navigator Documentation  Oncology Nurse Navigator Flowsheets 11/12/2019  Abnormal Finding Date -  Confirmed Diagnosis Date -  Diagnosis Status Confirmed Diagnosis Complete  Planned Course of Treatment Chemo/Radiation Concurrent  Phase of Treatment Targeted Therapy  Chemo/Radiation Concurrent Actual Start Date: 12/02/2018  Chemo/Radiation Concurrent Actual End Date: 01/05/2019  Targeted Therapy Actual Start Date: 02/05/2019  Navigator Follow Up Date: 11/12/2019  Navigator Follow Up Reason: Follow-up Appointment  Navigation Complete Date: 11/12/2019  Post Navigation: Continue to Follow Patient? No  Reason Not Navigating Patient: Patient On Maintenance Chemotherapy  Navigator Location [facility]  Referral Date to RadOnc/MedOnc -  Navigator Encounter Type Clinic/MDC/I followed up with Mr. Poke today.  He is doing well without complaints.  I asked that he follow up with me it needed.   Telephone -  [facility] Date -  Multidisiplinary Clinic Type -  Treatment Initiated Date 12/01/2018  Patient Visit Type Follow-up  Treatment Phase Treatment  Barriers/Navigation Needs Education  Education Other  Interventions Education  Acuity Level 2-Minimal Needs (1-2 Barriers Identified)  Coordination of Care -  Education Method Verbal  Support Groups/Services -  Time Spent with Patient 30

## 2019-11-12 NOTE — Progress Notes (Signed)
[facility] [phone]   [phone]  OFFICE PROGRESS NOTE  Celene Squibb, MD [address] [place] [address]  DIAGNOSIS: stage IIIA (T2b, N2, M0) non-small cell lung cancer, squamous cell carcinoma presented with right upper lobe lung mass in addition to right paratracheal lymphadenopathy diagnosed in December 2019. The patient also has a hypermetabolic lesion in the left parotid gland that need further evaluation.  PRIOR THERAPY:  concurrent chemoradiation with weekly carboplatin for AUC of 2 and paclitaxel 45 mg/M2.  First dose started on 12/02/2018. Status post 6 cycles.  Last cycle was given on January 05, 2019 the patient is receiving radiation closer to home in [place], [place] completed January 12, 2019.   CURRENT THERAPY:  Consolidation treatment with immunotherapy with Imfinzi 10 mg/KG IV every 2 weeks.  First dose was given February 05, 2019. Status post 20 cycles.  INTERVAL HISTORY: Alexander Hatfield. 63 y.o. male returns to the clinic today for follow-up visit.  The patient is feeling fine today with no concerning complaints.  He continues to play golf at regular basis.  He denied having any chest pain, shortness of breath except with exertion with no cough or hemoptysis.  He has no fever or chills.  He has no nausea, vomiting, diarrhea or constipation.  He continues to tolerate his treatment with Imfinzi fairly well.  The patient is here today for evaluation before starting cycle #21.   MEDICAL HISTORY: Past Medical History:  Diagnosis Date   Arthritis    Chronic back pain    Chronic back pain    Headache    HOH (hard of hearing)    Hypertension    Lung mass    Pre-diabetes    Sleep apnea    wears cpap   Wears glasses    Wears partial dentures     ALLERGIES:  is allergic to iohexol.  MEDICATIONS:  Current Outpatient Medications  Medication Sig Dispense Refill   albuterol (PROVENTIL) (2.5 MG/3ML) 0.083%  nebulizer solution VVN QID PRN     amLODipine (NORVASC) 10 MG tablet Take 10 mg by mouth daily.      baclofen (LIORESAL) 10 MG tablet Take 10 mg by mouth 3 (three) times daily.     BELBUCA 450 MCG FILM Take by mouth 2 (two) times daily.     BREO ELLIPTA 200-25 MCG/INH AEPB Inhale 1 puff into the lungs daily.     D3-50 1.25 MG (50000 UT) capsule Take 50,000 Units by mouth once a week.     furosemide (LASIX) 20 MG tablet Take 20 mg by mouth daily.      gabapentin (NEURONTIN) 300 MG capsule Take 600 mg by mouth 3 (three) times daily. Pt states taking 3Tablets- 326m tablet TID     guaifenesin (ROBITUSSIN) 100 MG/5ML syrup Take 200 mg by mouth 3 (three) times daily as needed for cough.     ipratropium-albuterol (DUONEB) 0.5-2.5 (3) MG/3ML SOLN SMARTSIG:1 Vial(s) Via Nebulizer 4 Times Daily PRN     lidocaine-prilocaine (EMLA) cream Apply 1 application topically as needed. (Patient not taking: Reported on 10/29/2019) 30 g 0   losartan (COZAAR) 100 MG tablet Take 100 mg by mouth daily.      magic mouthwash w/lidocaine SOLN Take by mouth.     meloxicam (MOBIC) 15 MG tablet Take 15 mg by mouth daily.      NARCAN 4 MG/0.1ML LIQD nasal spray kit Place 1 spray into the  nose See admin instructions. Administer a single spray in one nostril upon signs of opioid overdose.  Call 911.  Repeat after 3 minutes if no response.     oxyCODONE-acetaminophen (PERCOCET) 10-325 MG tablet Take 1 tablet by mouth 4 (four) times daily as needed.     potassium chloride SA (K-DUR,KLOR-CON) 20 MEQ tablet Take 20 mEq by mouth every evening.      prochlorperazine (COMPAZINE) 10 MG tablet Take 1 tablet (10 mg total) by mouth every 6 (six) hours as needed for nausea or vomiting. (Patient not taking: Reported on 10/29/2019) 30 tablet 0   Umeclidinium-Vilanterol (ANORO ELLIPTA IN) Inhale into the lungs.     No current facility-administered medications for this visit.    SURGICAL HISTORY:  Past Surgical History:    Procedure Laterality Date   ANTERIOR CERVICAL DECOMP/DISCECTOMY FUSION N/A 10/27/2018   Procedure: ANTERIOR CERVICAL THREE-FOUR, FOUR-FIVE DECOMPRESSION/DISCECTOMY FUSION TWO LEVELS;  Surgeon: Kary Kos, MD;  Location: [facility];  Service: Neurosurgery;  Laterality: N/A;   ANTERIOR CERVICAL THREE-FOUR, FOUR-FIVE DECOMPRESSION/DISCECTOMY FUSION TWO LEVELS   BACK SURGERY     IR IMAGING GUIDED PORT INSERTION  11/27/2018   MULTIPLE TOOTH EXTRACTIONS     SPINAL CORD STIMULATOR IMPLANT     VIDEO BRONCHOSCOPY N/A 10/29/2018   Procedure: VIDEO BRONCHOSCOPY;  Surgeon: Laurin Coder, MD;  Location: [facility];  Service: Thoracic;  Laterality: N/A;   VIDEO BRONCHOSCOPY WITH ENDOBRONCHIAL ULTRASOUND N/A 10/27/2018   Procedure: VIDEO BRONCHOSCOPY WITH ENDOBRONCHIAL ULTRASOUND;  Surgeon: Laurin Coder, MD;  Location: [facility];  Service: Thoracic;  Laterality: N/A;   VIDEO BRONCHOSCOPY WITH ENDOBRONCHIAL ULTRASOUND N/A 10/29/2018   Procedure: VIDEO BRONCHOSCOPY WITH ENDOBRONCHIAL ULTRASOUND;  Surgeon: Laurin Coder, MD;  [facility];  Service: Thoracic;  Laterality: N/A;    REVIEW OF SYSTEMS:  A comprehensive review of systems was negative except for: Respiratory: positive for dyspnea on exertion   PHYSICAL EXAMINATION: General appearance: alert, cooperative and no distress Head: Normocephalic, without obvious abnormality, atraumatic Neck: no adenopathy, no JVD, supple, symmetrical, trachea midline and thyroid not enlarged, symmetric, no tenderness/mass/nodules Lymph nodes: Cervical, supraclavicular, and axillary nodes normal. Resp: clear to auscultation bilaterally Back: symmetric, no curvature. ROM normal. No CVA tenderness. Cardio: regular rate and rhythm, S1, S2 normal, no murmur, click, rub or gallop GI: soft, non-tender; bowel sounds normal; no masses,  no organomegaly Extremities: extremities normal, atraumatic, no cyanosis or edema  ECOG PERFORMANCE STATUS: 1 - Symptomatic but  completely ambulatory  Blood pressure (!) 148/78, pulse 93, temperature 98.3 F (36.8 C), temperature source Temporal, resp. rate 18, height 6' 1"  (1.854 m), weight (!) 342 lb 14.4 oz (155.5 kg), SpO2 94 %.  LABORATORY DATA: Lab Results  Component Value Date   WBC 8.6 11/12/2019   HGB 13.4 11/12/2019   HCT 42.5 11/12/2019   MCV 95.3 11/12/2019   PLT 263 11/12/2019      Chemistry      Component Value Date/Time   NA 142 11/12/2019 0812   K 4.5 11/12/2019 0812   CL 104 11/12/2019 0812   CO2 28 11/12/2019 0812   BUN 14 11/12/2019 0812   CREATININE 0.85 11/12/2019 0812      Component Value Date/Time   CALCIUM 9.1 11/12/2019 0812   ALKPHOS 100 11/12/2019 0812   AST 14 (L) 11/12/2019 0812   ALT 15 11/12/2019 0812   BILITOT 0.4 11/12/2019 0812       RADIOGRAPHIC STUDIES: US ARTERIAL ABI (SCREENING LOWER EXTREMITY)  Result Date:  10/15/2019 CLINICAL DATA:  64 year old male with a history of multiple back surgeries and pain when walking. Evaluate for underlying peripheral arterial disease as a source for vascular claudication. EXAM: NONINVASIVE PHYSIOLOGIC VASCULAR STUDY OF BILATERAL LOWER EXTREMITIES TECHNIQUE: Evaluation of both lower extremities were performed at rest, including calculation of ankle-brachial indices with single level Doppler, pressure and pulse volume recording. COMPARISON:  None. FINDINGS: Right ABI:  0.73 Left ABI:  0.75 Right Lower Extremity:  Biphasic arterial waveforms. Left Lower Extremity:  Biphasic arterial waveforms. 0.5-0.79 Moderate PAD IMPRESSION: Abnormal bilateral resting ankle-brachial indices consistent with at least moderate underlying peripheral arterial disease. CT arteriogram with runoff could further evaluate if clinically warranted. Signed, Criselda Peaches, MD, [facility] Electronically Signed   By: Jacqulynn Cadet M.D.   On: 10/15/2019 09:56    ASSESSMENT AND PLAN: This is a  very pleasant 63 years old white male with a stage IIIa non-small cell lung cancer, squamous cell carcinoma diagnosed in December 2019. The patient completed a course of treatment with concurrent chemoradiation with weekly carboplatin and paclitaxel status post 6 cycles.  He had partial response to this treatment. He was a started on consolidation treatment with Imfinzi status post 20 cycles. The patient continues to tolerate this treatment well with no concerning adverse effects. I recommended for him to proceed with cycle #21 today as planned. For the prefer arterial disease he is scheduled to see Dr. Johnsie Cancel in few weeks for evaluation. For the chronic back pain he will continue his follow-up visit and evaluation with the [facility] pain clinic. He will come back for follow-up visit in 2 weeks for evaluation before the next cycle of his treatment. The patient was advised to call immediately if he has any concerning symptoms in the interval. The patient voices understanding of current disease status and treatment options and is in agreement with the current care plan. All questions were answered. The patient knows to call the clinic with any problems, questions or concerns. We can certainly see the patient much sooner if necessary.  Disclaimer: This note was dictated with voice recognition software. Similar sounding words can inadvertently be transcribed and may not be corrected upon review.

## 2019-11-16 ENCOUNTER — Encounter: Payer: Self-pay | Admitting: Cardiovascular Disease

## 2019-11-16 ENCOUNTER — Ambulatory Visit (INDEPENDENT_AMBULATORY_CARE_PROVIDER_SITE_OTHER): Payer: Medicare Other | Admitting: Cardiovascular Disease

## 2019-11-16 ENCOUNTER — Other Ambulatory Visit: Payer: Self-pay

## 2019-11-16 VITALS — BP 134/75 | HR 87 | Temp 97.3°F | Ht 73.0 in | Wt 341.0 lb

## 2019-11-16 DIAGNOSIS — I739 Peripheral vascular disease, unspecified: Secondary | ICD-10-CM | POA: Diagnosis not present

## 2019-11-16 NOTE — Patient Instructions (Signed)
Medication Instructions:  Your physician recommends that you continue on your current medications as directed. Please refer to the Current Medication list given to you today.   Labwork: NONE  Testing/Procedures: LOWER EXTREMITY ULTRASOUND  Follow-Up: Your physician recommends that you schedule a follow-up appointment in: AS NEEDED    Any Other Special Instructions Will Be Listed Below (If Applicable).  You have been referred to DR. BERRY    If you need a refill on your cardiac medications before your next appointment, please call your pharmacy.

## 2019-11-23 ENCOUNTER — Other Ambulatory Visit: Payer: Self-pay

## 2019-11-23 ENCOUNTER — Ambulatory Visit (HOSPITAL_COMMUNITY)
Admission: RE | Admit: 2019-11-23 | Discharge: 2019-11-23 | Disposition: A | Discharge status: Home / Self Care | Payer: Medicare Other | Source: Ambulatory Visit | Attending: Cardiovascular Disease | Admitting: Cardiovascular Disease

## 2019-11-23 ENCOUNTER — Other Ambulatory Visit: Payer: Self-pay | Admitting: Cardiovascular Disease

## 2019-11-23 ENCOUNTER — Telehealth: Payer: Self-pay | Admitting: Cardiovascular Disease

## 2019-11-23 DIAGNOSIS — I739 Peripheral vascular disease, unspecified: Secondary | ICD-10-CM

## 2019-11-26 ENCOUNTER — Inpatient Hospital Stay: Payer: Medicare Other | Attending: Internal Medicine | Admitting: Physician Assistant

## 2019-11-26 ENCOUNTER — Inpatient Hospital Stay: Payer: Medicare Other

## 2019-11-26 ENCOUNTER — Encounter: Payer: Self-pay | Admitting: Physician Assistant

## 2019-11-26 ENCOUNTER — Other Ambulatory Visit: Payer: Self-pay

## 2019-11-26 VITALS — BP 149/83 | HR 89 | Temp 97.9°F | Resp 18 | Ht 73.0 in | Wt 336.7 lb

## 2019-11-26 DIAGNOSIS — Z95828 Presence of other vascular implants and grafts: Secondary | ICD-10-CM

## 2019-11-26 DIAGNOSIS — Z5112 Encounter for antineoplastic immunotherapy: Secondary | ICD-10-CM | POA: Insufficient documentation

## 2019-11-26 DIAGNOSIS — C3491 Malignant neoplasm of unspecified part of right bronchus or lung: Secondary | ICD-10-CM

## 2019-11-26 DIAGNOSIS — Z79899 Other long term (current) drug therapy: Secondary | ICD-10-CM | POA: Insufficient documentation

## 2019-11-26 DIAGNOSIS — R635 Abnormal weight gain: Secondary | ICD-10-CM | POA: Diagnosis not present

## 2019-11-26 DIAGNOSIS — I739 Peripheral vascular disease, unspecified: Secondary | ICD-10-CM

## 2019-11-26 DIAGNOSIS — C3411 Malignant neoplasm of upper lobe, right bronchus or lung: Secondary | ICD-10-CM | POA: Insufficient documentation

## 2019-11-26 LAB — CMP (CANCER CENTER ONLY)
ALT: 12 U/L (ref 0–44)
AST: 15 U/L (ref 15–41)
Albumin: 4 g/dL (ref 3.5–5.0)
Alkaline Phosphatase: 103 U/L (ref 38–126)
Anion gap: 11 (ref 5–15)
BUN: 11 mg/dL (ref 8–23)
CO2: 28 mmol/L (ref 22–32)
Calcium: 9 mg/dL (ref 8.9–10.3)
Chloride: 105 mmol/L (ref 98–111)
Creatinine: 0.87 mg/dL (ref 0.61–1.24)
GFR, Est AFR Am: >60 mL/min (ref >60)
GFR, Estimated: >60 mL/min (ref >60)
Glucose, Bld: 95 mg/dL (ref 70–99)
Potassium: 4.2 mmol/L (ref 3.5–5.1)
Sodium: 144 mmol/L (ref 135–145)
Total Bilirubin: 0.5 mg/dL (ref 0.3–1.2)
Total Protein: 7.3 g/dL (ref 6.5–8.1)

## 2019-11-26 LAB — TSH: TSH: 1.197 u[IU]/mL (ref 0.320–4.118)

## 2019-11-26 LAB — CBC WITH DIFFERENTIAL (CANCER CENTER ONLY)
Abs Immature Granulocytes: 0.02 10*3/uL (ref 0.00–0.07)
Basophils Absolute: 0 10*3/uL (ref 0.0–0.1)
Basophils Relative: 1 %
Eosinophils Absolute: 0.3 10*3/uL (ref 0.0–0.5)
Eosinophils Relative: 4 %
HCT: 43.6 % (ref 39.0–52.0)
Hemoglobin: 14.1 g/dL (ref 13.0–17.0)
Immature Granulocytes: 0 %
Lymphocytes Relative: 19 %
Lymphs Abs: 1.3 10*3/uL (ref 0.7–4.0)
MCH: 30.5 pg (ref 26.0–34.0)
MCHC: 32.3 g/dL (ref 30.0–36.0)
MCV: 94.2 fL (ref 80.0–100.0)
Monocytes Absolute: 0.7 10*3/uL (ref 0.1–1.0)
Monocytes Relative: 11 %
Neutro Abs: 4.2 10*3/uL (ref 1.7–7.7)
Neutrophils Relative %: 65 %
Platelet Count: 291 10*3/uL (ref 150–400)
RBC: 4.63 MIL/uL (ref 4.22–5.81)
RDW: 13.8 % (ref 11.5–15.5)
WBC Count: 6.5 10*3/uL (ref 4.0–10.5)
nRBC: 0 % (ref 0.0–0.2)

## 2019-11-26 MED ORDER — SODIUM CHLORIDE 0.9% FLUSH
10.0000 mL | INTRAVENOUS | Status: DC | PRN
  Administered 2019-11-26: 10 mL
  Filled 2019-11-26: qty 10

## 2019-11-26 MED ORDER — HEPARIN SOD (PORK) LOCK FLUSH 100 UNIT/ML IV SOLN
500.0000 [IU] | Freq: Once | INTRAVENOUS | Status: AC | PRN
  Administered 2019-11-26: 500 [IU]
  Filled 2019-11-26: qty 5

## 2019-11-26 MED ORDER — SODIUM CHLORIDE 0.9 % IV SOLN
10.6000 mg/kg | Freq: Once | INTRAVENOUS | Status: AC
  Administered 2019-11-26: 1500 mg via INTRAVENOUS
  Filled 2019-11-26: qty 30

## 2019-11-26 MED ORDER — SODIUM CHLORIDE 0.9 % IV SOLN
Freq: Once | INTRAVENOUS | Status: AC
  Filled 2019-11-26: qty 250

## 2019-11-26 MED ORDER — SODIUM CHLORIDE 0.9% FLUSH
10.0000 mL | Freq: Once | INTRAVENOUS | Status: AC
  Administered 2019-11-26: 09:00:00 10 mL
  Filled 2019-11-26: qty 10

## 2019-11-26 NOTE — Progress Notes (Signed)
[facility] OFFICE PROGRESS NOTE  Celene Squibb, MD [address] [place] [address]  DIAGNOSIS: Stage IIIA(T2b, N2, M0) non-small cell lung cancer, squamous cell carcinoma presented with right upper lobe lung mass in addition to right paratracheal lymphadenopathy diagnosed in December 2019. The patient also has a hypermetabolic lesion in the left parotid gland that need further evaluation.  PRIOR THERAPY: Concurrent chemoradiation with weekly carboplatin for AUC of 2 and paclitaxel 45 mg/M2.First dose started on 12/02/2018.Status post 6 cycles. Last cycle was given on January 05, 2019 the patient is receiving radiation closer to home in [place], [place] completed January 12, 2019.  CURRENT THERAPY: Consolidation treatment with immunotherapy with Imfinzi 10 mg/KG IV every 2 weeks. First dose was given February 05, 2019. Status post21cycles.  INTERVAL HISTORY: Vanna Shavers. 64 y.o. male returns to the clinic for a follow up visit. The patient is feeling well today without any concerning complaints except for continued leg pain and weakness with ambulation. He recently saw cardiology for this concern who felt that this was more related being mechanical/neuropathy for his multiple back surgeries. He states he is getting an ultrasound of his legs and has a follow up appointment with Dr. Gwenlyn Found for this concern.   Regarding his treatment with immunotherapy with Imfinzi,  the patient continues to tolerate treatment well without any adverse side effects. Denies any fever, chills, night sweats, or weight loss. Denies any chest pain, cough, or hemoptysis. He still is reporting shortness of breath with exertion. He has a pulmonologist who he states that he has not seen in awhile.  Denies any nausea, vomiting, diarrhea, or constipation. Denies any headache or visual changes. Denies any rashes or skin changes. The patient is here today for evaluation prior to starting cycle # 22.    MEDICAL HISTORY: Past Medical History:  Diagnosis Date  . Arthritis   . Chronic back pain   . Chronic back pain   . Headache   . HOH (hard of hearing)   . Hypertension   . Lung mass   . Pre-diabetes   . Sleep apnea    wears cpap  . Wears glasses   . Wears partial dentures     ALLERGIES:  is allergic to iohexol.  MEDICATIONS:  Current Outpatient Medications  Medication Sig Dispense Refill  . albuterol (PROVENTIL) (2.5 MG/3ML) 0.083% nebulizer solution VVN QID PRN    . amLODipine (NORVASC) 10 MG tablet Take 10 mg by mouth daily.     Marland Kitchen atorvastatin (LIPITOR) 40 MG tablet Take 40 mg by mouth at bedtime.    . baclofen (LIORESAL) 10 MG tablet Take 10 mg by mouth 3 (three) times daily.    Marland Kitchen BELBUCA 450 MCG FILM Take 600 mcg by mouth 2 (two) times daily.     Marland Kitchen BREO ELLIPTA 200-25 MCG/INH AEPB Inhale 1 puff into the lungs daily.    . D3-50 1.25 MG (50000 UT) capsule Take 50,000 Units by mouth once a week.    . furosemide (LASIX) 20 MG tablet Take 20 mg by mouth daily.     Marland Kitchen gabapentin (NEURONTIN) 300 MG capsule Take 600 mg by mouth 3 (three) times daily. Pt states taking 3Tablets- 375m tablet TID    . guaifenesin (ROBITUSSIN) 100 MG/5ML syrup Take 200 mg by mouth 3 (three) times daily as needed for cough.    .Marland Kitchenipratropium-albuterol (DUONEB) 0.5-2.5 (3) MG/3ML SOLN SMARTSIG:1 Vial(s) Via Nebulizer 4 Times Daily PRN    .  lidocaine-prilocaine (EMLA) cream Apply 1 application topically as needed. 30 g 0  . losartan (COZAAR) 100 MG tablet Take 100 mg by mouth daily.     . magic mouthwash w/lidocaine SOLN Take by mouth.    . meloxicam (MOBIC) 15 MG tablet Take 15 mg by mouth daily.     Marland Kitchen NARCAN 4 MG/0.1ML LIQD nasal spray kit Place 1 spray into the nose See admin instructions. Administer a single spray in one nostril upon signs of opioid overdose.  Call 911.  Repeat after 3 minutes if no response.    . oxyCODONE-acetaminophen (PERCOCET) 10-325 MG tablet Take 1 tablet by mouth 4 (four)  times daily as needed.    . potassium chloride SA (K-DUR,KLOR-CON) 20 MEQ tablet Take 20 mEq by mouth every evening.     . prochlorperazine (COMPAZINE) 10 MG tablet Take 1 tablet (10 mg total) by mouth every 6 (six) hours as needed for nausea or vomiting. 30 tablet 0  . Umeclidinium-Vilanterol (ANORO ELLIPTA IN) Inhale into the lungs.     No current facility-administered medications for this visit.   Facility-Administered Medications Ordered in Other Visits  Medication Dose Route Frequency Provider Last Rate Last Admin  . heparin lock flush 100 unit/mL  500 Units Intracatheter Once PRN Curt Bears, MD      . sodium chloride flush (NS) 0.9 % injection 10 mL  10 mL Intracatheter PRN Curt Bears, MD        SURGICAL HISTORY:  Past Surgical History:  Procedure Laterality Date  . ANTERIOR CERVICAL DECOMP/DISCECTOMY FUSION N/A 10/27/2018   Procedure: ANTERIOR CERVICAL THREE-FOUR, FOUR-FIVE DECOMPRESSION/DISCECTOMY FUSION TWO LEVELS;  Surgeon: Kary Kos, MD;  Location: [facility];  Service: Neurosurgery;  Laterality: N/A;   ANTERIOR CERVICAL THREE-FOUR, FOUR-FIVE DECOMPRESSION/DISCECTOMY FUSION TWO LEVELS  . BACK SURGERY    . IR IMAGING GUIDED PORT INSERTION  11/27/2018  . MULTIPLE TOOTH EXTRACTIONS    . SPINAL CORD STIMULATOR IMPLANT    . VIDEO BRONCHOSCOPY N/A 10/29/2018   Procedure: VIDEO BRONCHOSCOPY;  Surgeon: Laurin Coder, MD;  Location: [facility];  Service: Thoracic;  Laterality: N/A;  . VIDEO BRONCHOSCOPY WITH ENDOBRONCHIAL ULTRASOUND N/A 10/27/2018   Procedure: VIDEO BRONCHOSCOPY WITH ENDOBRONCHIAL ULTRASOUND;  Surgeon: Laurin Coder, MD;  Location: [facility];  Service: Thoracic;  Laterality: N/A;  . VIDEO BRONCHOSCOPY WITH ENDOBRONCHIAL ULTRASOUND N/A 10/29/2018   Procedure: VIDEO BRONCHOSCOPY WITH ENDOBRONCHIAL ULTRASOUND;  Surgeon: Laurin Coder, MD;  Location: [facility];  Service: Thoracic;  Laterality: N/A;    REVIEW OF SYSTEMS:   Review of Systems  Constitutional:  Negative for appetite change, chills, fatigue, fever and unexpected weight change.  HENT: Negative for mouth sores, nosebleeds, sore throat and trouble swallowing.   Eyes: Negative for eye problems and icterus.  Respiratory: Positive for shortness of breath with exertion. Negative for cough, hemoptysis, and wheezing.  Cardiovascular: Negative for chest pain and leg swelling.  Gastrointestinal: Negative for abdominal pain, constipation, diarrhea, nausea and vomiting.  Genitourinary: Negative for bladder incontinence, difficulty urinating, dysuria, frequency and hematuria.   Musculoskeletal: Positive for chronic back pain. Negative for gait problem, neck pain and neck stiffness.  Skin: Negative for itching and rash.  Neurological: Negative for dizziness, extremity weakness, gait problem, headaches, light-headedness and seizures.  Hematological: Negative for adenopathy. Does not bruise/bleed easily.  Psychiatric/Behavioral: Negative for confusion, depression and sleep disturbance. The patient is not nervous/anxious.     PHYSICAL EXAMINATION:  Blood pressure (!) 149/83, pulse 89, temperature 97.9 F (36.6 C), temperature  source Temporal, resp. rate 18, height 6' 1"  (1.854 m), weight (!) 336 lb 11.2 oz (152.7 kg), SpO2 98 %.  ECOG PERFORMANCE STATUS: 1 - Symptomatic but completely ambulatory  Physical Exam  Constitutional: Oriented to person, place, and time and well-developed, well-nourished, and in no distress.  HENT:  Head: Normocephalic and atraumatic.  Mouth/Throat: Oropharynx is clear and moist. No oropharyngeal exudate.  Eyes: Conjunctivae are normal. Right eye exhibits no discharge. Left eye exhibits no discharge. No scleral icterus.  Neck: Normal range of motion. Neck supple.  Cardiovascular: Normal rate, regular rhythm, normal heart sounds and intact distal pulses.   Pulmonary/Chest: Effort normal and breath sounds normal. No respiratory distress. No wheezes. No rales.  Abdominal:  Soft. Bowel sounds are normal. Exhibits no distension and no mass. There is no tenderness.  Musculoskeletal: Normal range of motion. Exhibits no edema.  Lymphadenopathy:    No cervical adenopathy.  Neurological: Alert and oriented to person, place, and time. Exhibits normal muscle tone. Gait normal. Coordination normal.  Skin: Skin is warm and dry. No rash noted. Not diaphoretic. No erythema. No pallor.  Psychiatric: Mood, memory and judgment normal.  Vitals reviewed.  LABORATORY DATA: Lab Results  Component Value Date   WBC 6.5 11/26/2019   HGB 14.1 11/26/2019   HCT 43.6 11/26/2019   MCV 94.2 11/26/2019   PLT 291 11/26/2019      Chemistry      Component Value Date/Time   NA 144 11/26/2019 0930   K 4.2 11/26/2019 0930   CL 105 11/26/2019 0930   CO2 28 11/26/2019 0930   BUN 11 11/26/2019 0930   CREATININE 0.87 11/26/2019 0930      Component Value Date/Time   CALCIUM 9.0 11/26/2019 0930   ALKPHOS 103 11/26/2019 0930   AST 15 11/26/2019 0930   ALT 12 11/26/2019 0930   BILITOT 0.5 11/26/2019 0930       RADIOGRAPHIC STUDIES:  US ARTERIAL LOWER EXTREMITY DUPLEX BILATERAL  Result Date: 11/24/2019 CLINICAL DATA:  Bilateral lower extremity chronic pain. ABIs on 10/13/2019 was Rt = 0.73 and Lt = .75. EXAM: BILATERAL LOWER EXTREMITY ARTERIAL DUPLEX SCAN TECHNIQUE: Gray-scale sonography as well as color Doppler and duplex ultrasound was performed to evaluate the arteries of both lower extremities including the common, superficial and profunda femoral arteries, popliteal artery and calf arteries. COMPARISON:  Ankle-brachial indices 10/13/2019 FINDINGS: Right Lower Extremity ABI: Not obtained Inflow: Echogenic calcified plaque in the right common femoral artery with biphasic/monophasic waveforms and elevated peak systolic velocity in the distal right common femoral artery that measures 335 cm/sec. Outflow: Right profunda femoral artery is patent with elevated velocity in the proximal  aspect measuring 254 cm/sec . Atherosclerotic plaque in the proximal SFA with elevated peak systolic velocity measuring 270 cm/sec. Monophasic waveforms throughout the right SFA. Extensive atherosclerotic plaque in the right popliteal artery with monophasic waveforms and no significant velocity elevation. Runoff: Monophasic waveforms in the right anterior tibial artery, posterior tibial artery and peroneal artery. Left Lower Extremity ABI: Not obtained Inflow: Atherosclerotic plaque in left common femoral artery with monophasic and biphasic waveforms. No significant velocity elevation in left common femoral artery. Outflow: Left profunda femoral artery is patent with monophasic waveforms. Left SFA is patent with predominantly monophasic waveforms and no focal velocity elevations. Left popliteal artery is patent with monophasic waveforms. Runoff: Monophasic waveforms in the left anterior tibial artery, posterior tibial artery and peroneal artery. IMPRESSION: 1. Atherosclerotic disease in the bilateral lower extremity arteries. Bilateral lower extremity arteries  are patent. 2. Evidence for hemodynamically significant stenosis involving the distal right common femoral artery, proximal right superficial femoral artery and right profunda femoral artery. Electronically Signed   By: Markus Daft M.D.   On: 11/24/2019 08:41     ASSESSMENT/PLAN:  This isa very pleasant 64 year old Caucasian male with stage III non-small cell lung cancer, squamous cell carcinoma. He presented with a right upper lobe lung mass in addition to right paratracheal lymphadenopathy. He was diagnosed in December 2019.  The patient completed a course of concurrent chemoradiation with weekly carboplatin and paclitaxel. He is status post 6 cycles. Hehad a partial response to treatment.  The patient is currently undergoing consolidation immunotherapy with Imfinzi 10 mg/kg IV every 2 weeks. He is status post21cycles. He has been  tolerating treatment well without any adverse side effects.  Labs were reviewed. Recommend that he proceed with cycle #22 today as scheduled.   We will see him back for a follow up visit in 2 weeks for evaluation before starting cycle #23.   He will continue to follow with his PCP and cardiology regarding his leg discomfort.   The patient was advised to call immediately if he has any concerning symptoms in the interval. The patient voices understanding of current disease status and treatment options and is in agreement with the current care plan. All questions were answered. The patient knows to call the clinic with any problems, questions or concerns. We can certainly see the patient much sooner if necessary  Orders Placed This Encounter  Procedures  . TSH    Standing Status:   Standing    Number of Occurrences:   2    Standing Expiration Date:   11/25/2020     Kairos Panetta L Sylvia Kondracki, PA-C 11/26/19

## 2019-11-26 NOTE — Patient Instructions (Signed)
[facility] Discharge Instructions for Patients Receiving Chemotherapy  Today you received the following chemotherapy agents:  Durvalumab (Imfinzi)  To help prevent nausea and vomiting after your treatment, we encourage you to take your nausea medication as prescribed.   If you develop nausea and vomiting that is not controlled by your nausea medication, call the clinic.   BELOW ARE SYMPTOMS THAT SHOULD BE REPORTED IMMEDIATELY:  *FEVER GREATER THAN 100.5 F  *CHILLS WITH OR WITHOUT FEVER  NAUSEA AND VOMITING THAT IS NOT CONTROLLED WITH YOUR NAUSEA MEDICATION  *UNUSUAL SHORTNESS OF BREATH  *UNUSUAL BRUISING OR BLEEDING  TENDERNESS IN MOUTH AND THROAT WITH OR WITHOUT PRESENCE OF ULCERS  *URINARY PROBLEMS  *BOWEL PROBLEMS  UNUSUAL RASH Items with * indicate a potential emergency and should be followed up as soon as possible.  Feel free to call the clinic should you have any questions or concerns. The clinic phone number is [phone].  Please show the CHEMO ALERT CARD at check-in to the Emergency Department and triage nurse.   

## 2019-11-27 ENCOUNTER — Telehealth: Payer: Self-pay | Admitting: Internal Medicine

## 2019-11-27 NOTE — Telephone Encounter (Signed)
Scheduled per los. Called and left msg. Mailed printout  °

## 2019-12-03 ENCOUNTER — Telehealth: Payer: Self-pay | Admitting: Medical Oncology

## 2019-12-03 NOTE — Telephone Encounter (Signed)
[employer] claim form . Wife stated Alexander Hatfield did not have type of cancer or his tx on the form. I re faxed the missing information to [facility]. Copy mail to pt.  Covid vaccine-Asking if pt can get COVID vaccine. I told wife he can get it.

## 2019-12-04 ENCOUNTER — Ambulatory Visit (INDEPENDENT_AMBULATORY_CARE_PROVIDER_SITE_OTHER): Payer: Medicare Other | Admitting: Cardiovascular Disease

## 2019-12-04 ENCOUNTER — Encounter: Payer: Self-pay | Admitting: Cardiovascular Disease

## 2019-12-04 ENCOUNTER — Other Ambulatory Visit: Payer: Self-pay

## 2019-12-04 VITALS — BP 146/90 | Temp 97.9°F | Ht 73.0 in | Wt 338.2 lb

## 2019-12-04 DIAGNOSIS — I739 Peripheral vascular disease, unspecified: Secondary | ICD-10-CM

## 2019-12-04 NOTE — Patient Instructions (Signed)
Medication Instructions:  Your physician recommends that you continue on your current medications as directed. Please refer to the Current Medication list given to you today.  If you need a refill on your cardiac medications before your next appointment, please call your pharmacy.   Lab work: NONE  Testing/Procedures: IN 2 MONTHS Your physician has requested that you have a lower extremity arterial exercise duplex. During this test, exercise and ultrasound are used to evaluate arterial blood flow in the legs. Allow one hour for this exam. There are no restrictions or special instructions.  AND  Your physician has requested that you have an ankle brachial index (ABI). During this test an ultrasound and blood pressure cuff are used to evaluate the arteries that supply the arms and legs with blood. Allow thirty minutes for this exam. There are no restrictions or special instructions.  Follow-Up: At [facility], you and your health needs are our priority.  As part of our continuing mission to provide you with exceptional heart care, we have created designated Provider Care Teams.  These Care Teams include your primary Cardiologist (physician) and Advanced Practice Providers (APPs -  Physician Assistants and Nurse Practitioners) who all work together to provide you with the care you need, when you need it. You may see Dr. Gwenlyn Found or one of the following Advanced Practice Providers on your designated Care Team:    Kerin Ransom, PA-C  [place], [place]  Coletta Memos, [place]  Your physician wants you to follow-up in: 3 months with Dr. Gwenlyn Found to discuss the PV Angiogram

## 2019-12-04 NOTE — Progress Notes (Signed)
12/04/2019 Alexander Hatfield   08-12-1956  [id number]  Primary Physician Celene Squibb, MD Primary Cardiologist: Lorretta Harp MD Lupe Carney, [place]  HPI:  Alexander Hatfield. is a 64 y.o. severely overweight married Caucasian male father of 1 son referred by Dr. Johnsie Cancel for PAD evaluation.  He has been disabled since age 23 because of back issues.  He was a Loss adjuster, chartered.  He stopped smoking 30 years ago because of the problems include hypertension hyperlipidemia both treated, is never had a heart attack or stroke.  Is no family history for heart disease.  He denies chest pain but has developed shortness of breath since he was diagnosed with squamous cell lung cancer and has undergone radiation therapy.  He does occasionally walk with a cane and has symptomatic leg discomfort when he walks plus or minus calf claudication.  Previous ABIs have been in the mid 0.7 range.   Current Meds  Medication Sig  . albuterol (PROVENTIL) (2.5 MG/3ML) 0.083% nebulizer solution VVN QID PRN  . amLODipine (NORVASC) 10 MG tablet Take 10 mg by mouth daily.   Marland Kitchen atorvastatin (LIPITOR) 40 MG tablet Take 40 mg by mouth at bedtime.  . baclofen (LIORESAL) 10 MG tablet Take 10 mg by mouth 3 (three) times daily.  Marland Kitchen BELBUCA 450 MCG FILM Take 600 mcg by mouth 2 (two) times daily.   Marland Kitchen BREO ELLIPTA 200-25 MCG/INH AEPB Inhale 1 puff into the lungs daily.  . D3-50 1.25 MG (50000 UT) capsule Take 50,000 Units by mouth once a week.  . furosemide (LASIX) 20 MG tablet Take 20 mg by mouth daily.   Marland Kitchen gabapentin (NEURONTIN) 300 MG capsule Take 600 mg by mouth 3 (three) times daily. Pt states taking 3Tablets- 375m tablet TID  . guaifenesin (ROBITUSSIN) 100 MG/5ML syrup Take 200 mg by mouth 3 (three) times daily as needed for cough.  .Marland Kitchenipratropium-albuterol (DUONEB) 0.5-2.5 (3) MG/3ML SOLN SMARTSIG:1 Vial(s) Via Nebulizer 4 Times Daily PRN  . lidocaine-prilocaine (EMLA) cream Apply 1 application topically as needed.  .Marland Kitchen losartan (COZAAR) 100 MG tablet Take 100 mg by mouth daily.   . magic mouthwash w/lidocaine SOLN Take by mouth.  . meloxicam (MOBIC) 15 MG tablet Take 15 mg by mouth daily.   .Marland KitchenNARCAN 4 MG/0.1ML LIQD nasal spray kit Place 1 spray into the nose See admin instructions. Administer a single spray in one nostril upon signs of opioid overdose.  Call 911.  Repeat after 3 minutes if no response.  . oxyCODONE-acetaminophen (PERCOCET) 10-325 MG tablet Take 1 tablet by mouth 4 (four) times daily as needed.  . potassium chloride SA (K-DUR,KLOR-CON) 20 MEQ tablet Take 20 mEq by mouth every evening.   . prochlorperazine (COMPAZINE) 10 MG tablet Take 1 tablet (10 mg total) by mouth every 6 (six) hours as needed for nausea or vomiting.  .Marland KitchenUmeclidinium-Vilanterol (ANORO ELLIPTA IN) Inhale into the lungs.     Allergies  Allergen Reactions  . Iohexol Other (See Comments)     Desc: PT STATES THAT WHEN GIVEN THE CONTRAST HIS LEGS BURN AND HE PASSES OUT, COMA FOR 7DAY. DR BThornton PapasWOULD NOT PRE MEDICATE DUE TO SEVERITY OF REACTION, STUDY WAS CX     Social History   Socioeconomic History  . Marital status: Married    Spouse name: Not on file  . Number of children: Not on file  . Years of education: Not on file  . Highest education level: Not  on file  Occupational History  . Not on file  Tobacco Use  . Smoking status: Former Smoker    Types: Cigarettes  . Smokeless tobacco: Never Used  . Tobacco comment: Quit smoking cigarettes 15 years ago  Substance and Sexual Activity  . Alcohol use: No  . Drug use: No  . Sexual activity: Not Currently  Other Topics Concern  . Not on file  Social History Narrative  . Not on file   Social Determinants of Health   Financial Resource Strain:   . Difficulty of Paying Living Expenses: Not on file  Food Insecurity:   . Worried About Charity fundraiser in the Last Year: Not on file  . Ran Out of Food in the Last Year: Not on file  Transportation Needs:   . Lack of  Transportation (Medical): Not on file  . Lack of Transportation (Non-Medical): Not on file  Physical Activity:   . Days of Exercise per Week: Not on file  . Minutes of Exercise per Session: Not on file  Stress:   . Feeling of Stress : Not on file  Social Connections:   . Frequency of Communication with Friends and Family: Not on file  . Frequency of Social Gatherings with Friends and Family: Not on file  . Attends Religious Services: Not on file  . Active Member of Clubs or Organizations: Not on file  . Attends Archivist Meetings: Not on file  . Marital Status: Not on file  Intimate Partner Violence:   . Fear of Current or Ex-Partner: Not on file  . Emotionally Abused: Not on file  . Physically Abused: Not on file  . Sexually Abused: Not on file     Review of Systems: General: negative for chills, fever, night sweats or weight changes.  Cardiovascular: negative for chest pain, dyspnea on exertion, edema, orthopnea, palpitations, paroxysmal nocturnal dyspnea or shortness of breath Dermatological: negative for rash Respiratory: negative for cough or wheezing Urologic: negative for hematuria Abdominal: negative for nausea, vomiting, diarrhea, bright red blood per rectum, melena, or hematemesis Neurologic: negative for visual changes, syncope, or dizziness All other systems reviewed and are otherwise negative except as noted above.    Blood pressure (!) 146/90, temperature 97.9 F (36.6 C), height 6' 1"  (1.854 m), weight (!) 338 lb 3.2 oz (153.4 kg), SpO2 90 %.  General appearance: alert and no distress Neck: no adenopathy, no carotid bruit, no JVD, supple, symmetrical, trachea midline and thyroid not enlarged, symmetric, no tenderness/mass/nodules Lungs: clear to auscultation bilaterally Heart: regular rate and rhythm, S1, S2 normal, no murmur, click, rub or gallop Extremities: extremities normal, atraumatic, no cyanosis or edema Pulses: 2+ and symmetric Skin: Skin  color, texture, turgor normal. No rashes or lesions Neurologic: Alert and oriented X 3, normal strength and tone. Normal symmetric reflexes. Normal coordination and gait  EKG sinus rhythm at 78 without ST or T wave changes. I  Personally reviewed this EKG.  ASSESSMENT AND PLAN:   Peripheral arterial disease [facility]) Mr. Sciarra was referred to me by Dr. Johnsie Cancel for PAD.  Her prior ABIs have been in the 0.73-0.75 range.  He has had multiple back surgeries.  He complains of calf claudication which is symmetric.  Is hard to distinguish whether this is neuropathic or vascular nature.  We will repeat his Dopplers in 2 months we will see him back after that for follow-up.  The current time we are not scheduling elective invasive procedures in the hospital.  Lorretta Harp MD FACP,FACC,FAHA, [facility] 12/04/2019 11:12 AM

## 2019-12-04 NOTE — Assessment & Plan Note (Signed)
Alexander Hatfield was referred to me by Dr. Johnsie Cancel for PAD.  Her prior ABIs have been in the 0.73-0.75 range.  He has had multiple back surgeries.  He complains of calf claudication which is symmetric.  Is hard to distinguish whether this is neuropathic or vascular nature.  We will repeat his Dopplers in 2 months we will see him back after that for follow-up.  The current time we are not scheduling elective invasive procedures in the hospital.

## 2019-12-09 NOTE — Progress Notes (Signed)
[facility] OFFICE PROGRESS NOTE  Celene Squibb, MD [address] [place] [address]  DIAGNOSIS: Stage IIIA(T2b, N2, M0) non-small cell lung cancer, squamous cell carcinoma presented with right upper lobe lung mass in addition to right paratracheal lymphadenopathy diagnosed in December 2019. The patient also has a hypermetabolic lesion in the left parotid gland that need further evaluation.  PRIOR THERAPY: Concurrent chemoradiation with weekly carboplatin for AUC of 2 and paclitaxel 45 mg/M2.First dose started on 12/02/2018.Status post 6 cycles. Last cycle was given on January 05, 2019 the patient is receiving radiation closer to home in [place], [place] completed January 12, 2019.  CURRENT THERAPY: Consolidation treatment with immunotherapy with Imfinzi 10 mg/KG IV every 2 weeks. First dose was given February 05, 2019. Status post22cycles.  INTERVAL HISTORY: Alexander Hatfield. 64 y.o. male returns to the clinic for a follow up visit. The patient is feeling well today without any concerning complaints. Regarding his treatment with immunotherapy with Imfinzi,  the patient continues to tolerate treatment well without any adverse side effects. Denies any fever, chills, night sweats, or weight loss. Denies any chest pain, cough, or hemoptysis. He still is reporting his baseline shortness of breath with exertion.  Denies any nausea, vomiting, diarrhea, or constipation. Denies any headache or visual changes. Denies any rashes or skin changes. The patient is here today for evaluation prior to starting cycle # 23  MEDICAL HISTORY: Past Medical History:  Diagnosis Date  . Arthritis   . Chronic back pain   . Chronic back pain   . Headache   . HOH (hard of hearing)   . Hypertension   . Lung mass   . Pre-diabetes   . Sleep apnea    wears cpap  . Wears glasses   . Wears partial dentures     ALLERGIES:  is allergic to iohexol.  MEDICATIONS:  Current Outpatient  Medications  Medication Sig Dispense Refill  . albuterol (PROVENTIL) (2.5 MG/3ML) 0.083% nebulizer solution VVN QID PRN    . amLODipine (NORVASC) 10 MG tablet Take 10 mg by mouth daily.     Marland Kitchen atorvastatin (LIPITOR) 40 MG tablet Take 40 mg by mouth at bedtime.    . baclofen (LIORESAL) 10 MG tablet Take 10 mg by mouth 3 (three) times daily.    Marland Kitchen BELBUCA 450 MCG FILM Take 600 mcg by mouth 2 (two) times daily.     Marland Kitchen BREO ELLIPTA 200-25 MCG/INH AEPB Inhale 1 puff into the lungs daily.    . D3-50 1.25 MG (50000 UT) capsule Take 50,000 Units by mouth once a week.    . furosemide (LASIX) 20 MG tablet Take 20 mg by mouth daily.     Marland Kitchen gabapentin (NEURONTIN) 300 MG capsule Take 600 mg by mouth 3 (three) times daily. Pt states taking 3Tablets- 362m tablet TID    . guaifenesin (ROBITUSSIN) 100 MG/5ML syrup Take 200 mg by mouth 3 (three) times daily as needed for cough.    .Marland Kitchenipratropium-albuterol (DUONEB) 0.5-2.5 (3) MG/3ML SOLN SMARTSIG:1 Vial(s) Via Nebulizer 4 Times Daily PRN    . lidocaine-prilocaine (EMLA) cream Apply 1 application topically as needed. 30 g 0  . losartan (COZAAR) 100 MG tablet Take 100 mg by mouth daily.     . magic mouthwash w/lidocaine SOLN Take by mouth.    . meloxicam (MOBIC) 15 MG tablet Take 15 mg by mouth daily.     .Marland KitchenNARCAN 4 MG/0.1ML LIQD nasal spray kit Place  1 spray into the nose See admin instructions. Administer a single spray in one nostril upon signs of opioid overdose.  Call 911.  Repeat after 3 minutes if no response.    . oxyCODONE-acetaminophen (PERCOCET) 10-325 MG tablet Take 1 tablet by mouth 4 (four) times daily as needed.    . potassium chloride SA (K-DUR,KLOR-CON) 20 MEQ tablet Take 20 mEq by mouth every evening.     . prochlorperazine (COMPAZINE) 10 MG tablet Take 1 tablet (10 mg total) by mouth every 6 (six) hours as needed for nausea or vomiting. 30 tablet 0  . Umeclidinium-Vilanterol (ANORO ELLIPTA IN) Inhale into the lungs.     No current  facility-administered medications for this visit.    SURGICAL HISTORY:  Past Surgical History:  Procedure Laterality Date  . ANTERIOR CERVICAL DECOMP/DISCECTOMY FUSION N/A 10/27/2018   Procedure: ANTERIOR CERVICAL THREE-FOUR, FOUR-FIVE DECOMPRESSION/DISCECTOMY FUSION TWO LEVELS;  Surgeon: Kary Kos, MD;  Location: [facility];  Service: Neurosurgery;  Laterality: N/A;   ANTERIOR CERVICAL THREE-FOUR, FOUR-FIVE DECOMPRESSION/DISCECTOMY FUSION TWO LEVELS  . BACK SURGERY    . IR IMAGING GUIDED PORT INSERTION  11/27/2018  . MULTIPLE TOOTH EXTRACTIONS    . SPINAL CORD STIMULATOR IMPLANT    . VIDEO BRONCHOSCOPY N/A 10/29/2018   Procedure: VIDEO BRONCHOSCOPY;  Surgeon: Laurin Coder, MD;  Location: [facility];  Service: Thoracic;  Laterality: N/A;  . VIDEO BRONCHOSCOPY WITH ENDOBRONCHIAL ULTRASOUND N/A 10/27/2018   Procedure: VIDEO BRONCHOSCOPY WITH ENDOBRONCHIAL ULTRASOUND;  Surgeon: Laurin Coder, MD;  Location: [facility];  Service: Thoracic;  Laterality: N/A;  . VIDEO BRONCHOSCOPY WITH ENDOBRONCHIAL ULTRASOUND N/A 10/29/2018   Procedure: VIDEO BRONCHOSCOPY WITH ENDOBRONCHIAL ULTRASOUND;  Surgeon: Laurin Coder, MD;  Location: [facility];  Service: Thoracic;  Laterality: N/A;    REVIEW OF SYSTEMS:   Review of Systems  Constitutional: Negative for appetite change, chills, fatigue, fever and unexpected weight change.  HENT: Negative for mouth sores, nosebleeds, sore throat and trouble swallowing.   Eyes: Negative for eye problems and icterus.  Respiratory: Positive for shortness of breath with exertion. Negative for cough, hemoptysis, and wheezing.  Cardiovascular: Negative for chest pain and leg swelling.  Gastrointestinal: Negative for abdominal pain, constipation, diarrhea, nausea and vomiting.  Genitourinary: Negative for bladder incontinence, difficulty urinating, dysuria, frequency and hematuria.   Musculoskeletal: Positive for chronic back pain. Negative for gait problem, neck pain and neck  stiffness.  Skin: Negative for itching and rash.  Neurological: Negative for dizziness, extremity weakness, gait problem, headaches, light-headedness and seizures.  Hematological: Negative for adenopathy. Does not bruise/bleed easily.  Psychiatric/Behavioral: Negative for confusion, depression and sleep disturbance. The patient is not nervous/anxious.    PHYSICAL EXAMINATION:  Blood pressure 120/89, pulse 79, temperature 97.9 F (36.6 C), temperature source Temporal, resp. rate 18, height 6' 1"  (1.854 m), weight (!) 337 lb 14.4 oz (153.3 kg), SpO2 97 %.  ECOG PERFORMANCE STATUS: 1 - Symptomatic but completely ambulatory  Physical Exam  Constitutional: Oriented to person, place, and time and well-developed, well-nourished, and in no distress.  HENT:  Head: Normocephalic and atraumatic.  Mouth/Throat: Oropharynx is clear and moist. No oropharyngeal exudate.  Eyes: Conjunctivae are normal. Right eye exhibits no discharge. Left eye exhibits no discharge. No scleral icterus.  Neck: Normal range of motion. Neck supple.  Cardiovascular: Normal rate, regular rhythm, normal heart sounds and intact distal pulses.   Pulmonary/Chest: Effort normal and breath sounds normal. No respiratory distress. No wheezes. No rales.  Abdominal: Soft. Bowel sounds are normal.  Exhibits no distension and no mass. There is no tenderness.  Musculoskeletal: Normal range of motion. Exhibits no edema.  Lymphadenopathy:    No cervical adenopathy.  Neurological: Alert and oriented to person, place, and time. Exhibits normal muscle tone. Gait normal. Coordination normal.  Skin: Skin is warm and dry. No rash noted. Not diaphoretic. No erythema. No pallor.  Psychiatric: Mood, memory and judgment normal.  Vitals reviewed.  LABORATORY DATA: Lab Results  Component Value Date   WBC 9.6 12/10/2019   HGB 14.1 12/10/2019   HCT 44.2 12/10/2019   MCV 93.8 12/10/2019   PLT 273 12/10/2019      Chemistry      Component Value  Date/Time   NA 144 11/26/2019 0930   K 4.2 11/26/2019 0930   CL 105 11/26/2019 0930   CO2 28 11/26/2019 0930   BUN 11 11/26/2019 0930   CREATININE 0.87 11/26/2019 0930      Component Value Date/Time   CALCIUM 9.0 11/26/2019 0930   ALKPHOS 103 11/26/2019 0930   AST 15 11/26/2019 0930   ALT 12 11/26/2019 0930   BILITOT 0.5 11/26/2019 0930       RADIOGRAPHIC STUDIES:  US ARTERIAL LOWER EXTREMITY DUPLEX BILATERAL  Result Date: 11/24/2019 CLINICAL DATA:  Bilateral lower extremity chronic pain. ABIs on 10/13/2019 was Rt = 0.73 and Lt = .75. EXAM: BILATERAL LOWER EXTREMITY ARTERIAL DUPLEX SCAN TECHNIQUE: Gray-scale sonography as well as color Doppler and duplex ultrasound was performed to evaluate the arteries of both lower extremities including the common, superficial and profunda femoral arteries, popliteal artery and calf arteries. COMPARISON:  Ankle-brachial indices 10/13/2019 FINDINGS: Right Lower Extremity ABI: Not obtained Inflow: Echogenic calcified plaque in the right common femoral artery with biphasic/monophasic waveforms and elevated peak systolic velocity in the distal right common femoral artery that measures 335 cm/sec. Outflow: Right profunda femoral artery is patent with elevated velocity in the proximal aspect measuring 254 cm/sec . Atherosclerotic plaque in the proximal SFA with elevated peak systolic velocity measuring 270 cm/sec. Monophasic waveforms throughout the right SFA. Extensive atherosclerotic plaque in the right popliteal artery with monophasic waveforms and no significant velocity elevation. Runoff: Monophasic waveforms in the right anterior tibial artery, posterior tibial artery and peroneal artery. Left Lower Extremity ABI: Not obtained Inflow: Atherosclerotic plaque in left common femoral artery with monophasic and biphasic waveforms. No significant velocity elevation in left common femoral artery. Outflow: Left profunda femoral artery is patent with monophasic  waveforms. Left SFA is patent with predominantly monophasic waveforms and no focal velocity elevations. Left popliteal artery is patent with monophasic waveforms. Runoff: Monophasic waveforms in the left anterior tibial artery, posterior tibial artery and peroneal artery. IMPRESSION: 1. Atherosclerotic disease in the bilateral lower extremity arteries. Bilateral lower extremity arteries are patent. 2. Evidence for hemodynamically significant stenosis involving the distal right common femoral artery, proximal right superficial femoral artery and right profunda femoral artery. Electronically Signed   By: Markus Daft M.D.   On: 11/24/2019 08:41     ASSESSMENT/PLAN:  This isa very pleasant 64 year old Caucasian male with stage III non-small cell lung cancer, squamous cell carcinoma. He presented with a right upper lobe lung mass in addition to right paratracheal lymphadenopathy. He was diagnosed in December 2019.  The patient completed a course of concurrent chemoradiation with weekly carboplatin and paclitaxel. He is status post 6 cycles. Hehad a partial response to treatment.  The patient is currently undergoing consolidation immunotherapy with Imfinzi 10 mg/kg IV every 2 weeks. He is status  post22cycles. He has been tolerating treatment well without any adverse side effects.  Labs were reviewed. Recommend that he proceed with cycle #23 today as scheduled.   We will see him back for a follow up visit in 2 weeks for evaluation before starting cycle #24.   The patient was advised to call immediately if he has any concerning symptoms in the interval. The patient voices understanding of current disease status and treatment options and is in agreement with the current care plan. All questions were answered. The patient knows to call the clinic with any problems, questions or concerns. We can certainly see the patient much sooner if necessary    No orders of the defined types were placed in  this encounter.    [place], PA-C 12/10/19

## 2019-12-10 ENCOUNTER — Encounter: Payer: Self-pay | Admitting: Physician Assistant

## 2019-12-10 ENCOUNTER — Inpatient Hospital Stay (HOSPITAL_BASED_OUTPATIENT_CLINIC_OR_DEPARTMENT_OTHER): Payer: Medicare Other | Admitting: Physician Assistant

## 2019-12-10 ENCOUNTER — Other Ambulatory Visit: Payer: Self-pay

## 2019-12-10 ENCOUNTER — Inpatient Hospital Stay: Payer: Medicare Other

## 2019-12-10 VITALS — BP 120/89 | HR 79 | Temp 97.9°F | Resp 18 | Ht 73.0 in | Wt 337.9 lb

## 2019-12-10 DIAGNOSIS — C3491 Malignant neoplasm of unspecified part of right bronchus or lung: Secondary | ICD-10-CM

## 2019-12-10 DIAGNOSIS — Z95828 Presence of other vascular implants and grafts: Secondary | ICD-10-CM

## 2019-12-10 DIAGNOSIS — Z5112 Encounter for antineoplastic immunotherapy: Secondary | ICD-10-CM

## 2019-12-10 DIAGNOSIS — R635 Abnormal weight gain: Secondary | ICD-10-CM

## 2019-12-10 LAB — CBC WITH DIFFERENTIAL (CANCER CENTER ONLY)
Abs Immature Granulocytes: 0.04 10*3/uL (ref 0.00–0.07)
Basophils Absolute: 0 10*3/uL (ref 0.0–0.1)
Basophils Relative: 0 %
Eosinophils Absolute: 0.3 10*3/uL (ref 0.0–0.5)
Eosinophils Relative: 3 %
HCT: 44.2 % (ref 39.0–52.0)
Hemoglobin: 14.1 g/dL (ref 13.0–17.0)
Immature Granulocytes: 0 %
Lymphocytes Relative: 16 %
Lymphs Abs: 1.5 10*3/uL (ref 0.7–4.0)
MCH: 29.9 pg (ref 26.0–34.0)
MCHC: 31.9 g/dL (ref 30.0–36.0)
MCV: 93.8 fL (ref 80.0–100.0)
Monocytes Absolute: 1 10*3/uL (ref 0.1–1.0)
Monocytes Relative: 10 %
Neutro Abs: 6.7 10*3/uL (ref 1.7–7.7)
Neutrophils Relative %: 71 %
Platelet Count: 273 10*3/uL (ref 150–400)
RBC: 4.71 MIL/uL (ref 4.22–5.81)
RDW: 13.4 % (ref 11.5–15.5)
WBC Count: 9.6 10*3/uL (ref 4.0–10.5)
nRBC: 0 % (ref 0.0–0.2)

## 2019-12-10 LAB — CMP (CANCER CENTER ONLY)
ALT: 12 U/L (ref 0–44)
AST: 13 U/L — ABNORMAL LOW (ref 15–41)
Albumin: 3.9 g/dL (ref 3.5–5.0)
Alkaline Phosphatase: 109 U/L (ref 38–126)
Anion gap: 10 (ref 5–15)
BUN: 12 mg/dL (ref 8–23)
CO2: 29 mmol/L (ref 22–32)
Calcium: 9 mg/dL (ref 8.9–10.3)
Chloride: 105 mmol/L (ref 98–111)
Creatinine: 0.83 mg/dL (ref 0.61–1.24)
GFR, Est AFR Am: >60 mL/min (ref >60)
GFR, Estimated: >60 mL/min (ref >60)
Glucose, Bld: 84 mg/dL (ref 70–99)
Potassium: 4 mmol/L (ref 3.5–5.1)
Sodium: 144 mmol/L (ref 135–145)
Total Bilirubin: 0.4 mg/dL (ref 0.3–1.2)
Total Protein: 7.3 g/dL (ref 6.5–8.1)

## 2019-12-10 LAB — TSH: TSH: 1.119 u[IU]/mL (ref 0.320–4.118)

## 2019-12-10 MED ORDER — SODIUM CHLORIDE 0.9 % IV SOLN
Freq: Once | INTRAVENOUS | Status: AC
  Filled 2019-12-10: qty 250

## 2019-12-10 MED ORDER — SODIUM CHLORIDE 0.9% FLUSH
10.0000 mL | INTRAVENOUS | Status: DC | PRN
  Administered 2019-12-10: 10 mL
  Filled 2019-12-10: qty 10

## 2019-12-10 MED ORDER — SODIUM CHLORIDE 0.9% FLUSH
10.0000 mL | Freq: Once | INTRAVENOUS | Status: AC
  Administered 2019-12-10: 13:00:00 10 mL
  Filled 2019-12-10: qty 10

## 2019-12-10 MED ORDER — HEPARIN SOD (PORK) LOCK FLUSH 100 UNIT/ML IV SOLN
500.0000 [IU] | Freq: Once | INTRAVENOUS | Status: AC | PRN
  Administered 2019-12-10: 500 [IU]
  Filled 2019-12-10: qty 5

## 2019-12-10 MED ORDER — SODIUM CHLORIDE 0.9 % IV SOLN
10.6000 mg/kg | Freq: Once | INTRAVENOUS | Status: AC
  Administered 2019-12-10: 1500 mg via INTRAVENOUS
  Filled 2019-12-10: qty 30

## 2019-12-10 NOTE — Patient Instructions (Signed)
[facility] Discharge Instructions for Patients Receiving Chemotherapy  Today you received the following chemotherapy agents:  Durvalumab (Imfinzi)  To help prevent nausea and vomiting after your treatment, we encourage you to take your nausea medication as prescribed.   If you develop nausea and vomiting that is not controlled by your nausea medication, call the clinic.   BELOW ARE SYMPTOMS THAT SHOULD BE REPORTED IMMEDIATELY:  *FEVER GREATER THAN 100.5 F  *CHILLS WITH OR WITHOUT FEVER  NAUSEA AND VOMITING THAT IS NOT CONTROLLED WITH YOUR NAUSEA MEDICATION  *UNUSUAL SHORTNESS OF BREATH  *UNUSUAL BRUISING OR BLEEDING  TENDERNESS IN MOUTH AND THROAT WITH OR WITHOUT PRESENCE OF ULCERS  *URINARY PROBLEMS  *BOWEL PROBLEMS  UNUSUAL RASH Items with * indicate a potential emergency and should be followed up as soon as possible.  Feel free to call the clinic should you have any questions or concerns. The clinic phone number is [phone].  Please show the CHEMO ALERT CARD at check-in to the Emergency Department and triage nurse.   

## 2019-12-17 ENCOUNTER — Encounter (HOSPITAL_COMMUNITY): Payer: Medicare Other

## 2019-12-17 ENCOUNTER — Ambulatory Visit (HOSPITAL_COMMUNITY): Payer: Medicare Other

## 2019-12-18 ENCOUNTER — Telehealth: Payer: Self-pay | Admitting: Internal Medicine

## 2019-12-18 ENCOUNTER — Telehealth: Payer: Self-pay | Admitting: Medical Oncology

## 2019-12-18 NOTE — Telephone Encounter (Signed)
COVID vaccine scheduled for next Thursday conflicts with his tx appts.   Per Cassie ,  I told wife that we may be able to move pt appts to day before or day after. Message sent to scheduler for availability.

## 2019-12-18 NOTE — Telephone Encounter (Signed)
I talk with patients wife regarding reschedule from 2/11 to 2/10 per 2/5 sch msg

## 2019-12-23 ENCOUNTER — Inpatient Hospital Stay: Payer: Medicare Other

## 2019-12-23 ENCOUNTER — Inpatient Hospital Stay (HOSPITAL_BASED_OUTPATIENT_CLINIC_OR_DEPARTMENT_OTHER): Payer: Medicare Other | Admitting: Internal Medicine

## 2019-12-23 ENCOUNTER — Inpatient Hospital Stay: Payer: Medicare Other | Attending: Internal Medicine

## 2019-12-23 ENCOUNTER — Encounter: Payer: Self-pay | Admitting: Internal Medicine

## 2019-12-23 ENCOUNTER — Other Ambulatory Visit: Payer: Self-pay

## 2019-12-23 VITALS — BP 121/71 | HR 84 | Temp 98.9°F | Resp 18 | Ht 73.0 in | Wt 341.0 lb

## 2019-12-23 DIAGNOSIS — Z79899 Other long term (current) drug therapy: Secondary | ICD-10-CM | POA: Insufficient documentation

## 2019-12-23 DIAGNOSIS — C3491 Malignant neoplasm of unspecified part of right bronchus or lung: Secondary | ICD-10-CM

## 2019-12-23 DIAGNOSIS — C3411 Malignant neoplasm of upper lobe, right bronchus or lung: Secondary | ICD-10-CM | POA: Diagnosis present

## 2019-12-23 DIAGNOSIS — Z95828 Presence of other vascular implants and grafts: Secondary | ICD-10-CM

## 2019-12-23 DIAGNOSIS — Z5112 Encounter for antineoplastic immunotherapy: Secondary | ICD-10-CM | POA: Diagnosis present

## 2019-12-23 LAB — CBC WITH DIFFERENTIAL (CANCER CENTER ONLY)
Abs Immature Granulocytes: 0.03 10*3/uL (ref 0.00–0.07)
Basophils Absolute: 0 10*3/uL (ref 0.0–0.1)
Basophils Relative: 1 %
Eosinophils Absolute: 0.3 10*3/uL (ref 0.0–0.5)
Eosinophils Relative: 5 %
HCT: 43 % (ref 39.0–52.0)
Hemoglobin: 13.6 g/dL (ref 13.0–17.0)
Immature Granulocytes: 0 %
Lymphocytes Relative: 17 %
Lymphs Abs: 1.2 10*3/uL (ref 0.7–4.0)
MCH: 30.3 pg (ref 26.0–34.0)
MCHC: 31.6 g/dL (ref 30.0–36.0)
MCV: 95.8 fL (ref 80.0–100.0)
Monocytes Absolute: 0.7 10*3/uL (ref 0.1–1.0)
Monocytes Relative: 10 %
Neutro Abs: 4.7 10*3/uL (ref 1.7–7.7)
Neutrophils Relative %: 67 %
Platelet Count: 255 10*3/uL (ref 150–400)
RBC: 4.49 MIL/uL (ref 4.22–5.81)
RDW: 13.5 % (ref 11.5–15.5)
WBC Count: 6.9 10*3/uL (ref 4.0–10.5)
nRBC: 0 % (ref 0.0–0.2)

## 2019-12-23 LAB — CMP (CANCER CENTER ONLY)
ALT: 14 U/L (ref 0–44)
AST: 13 U/L — ABNORMAL LOW (ref 15–41)
Albumin: 3.8 g/dL (ref 3.5–5.0)
Alkaline Phosphatase: 96 U/L (ref 38–126)
Anion gap: 9 (ref 5–15)
BUN: 16 mg/dL (ref 8–23)
CO2: 29 mmol/L (ref 22–32)
Calcium: 9.1 mg/dL (ref 8.9–10.3)
Chloride: 107 mmol/L (ref 98–111)
Creatinine: 0.92 mg/dL (ref 0.61–1.24)
GFR, Est AFR Am: >60 mL/min (ref >60)
GFR, Estimated: >60 mL/min (ref >60)
Glucose, Bld: 131 mg/dL — ABNORMAL HIGH (ref 70–99)
Potassium: 4.4 mmol/L (ref 3.5–5.1)
Sodium: 145 mmol/L (ref 135–145)
Total Bilirubin: 0.4 mg/dL (ref 0.3–1.2)
Total Protein: 7.1 g/dL (ref 6.5–8.1)

## 2019-12-23 MED ORDER — SODIUM CHLORIDE 0.9% FLUSH
10.0000 mL | Freq: Once | INTRAVENOUS | Status: AC
  Administered 2019-12-23: 10:00:00 10 mL
  Filled 2019-12-23: qty 10

## 2019-12-23 MED ORDER — SODIUM CHLORIDE 0.9 % IV SOLN
Freq: Once | INTRAVENOUS | Status: AC
  Filled 2019-12-23: qty 250

## 2019-12-23 MED ORDER — SODIUM CHLORIDE 0.9% FLUSH
10.0000 mL | INTRAVENOUS | Status: DC | PRN
  Administered 2019-12-23: 10 mL
  Filled 2019-12-23: qty 10

## 2019-12-23 MED ORDER — SODIUM CHLORIDE 0.9 % IV SOLN
10.6000 mg/kg | Freq: Once | INTRAVENOUS | Status: AC
  Administered 2019-12-23: 1500 mg via INTRAVENOUS
  Filled 2019-12-23: qty 30

## 2019-12-23 MED ORDER — HEPARIN SOD (PORK) LOCK FLUSH 100 UNIT/ML IV SOLN
500.0000 [IU] | Freq: Once | INTRAVENOUS | Status: AC | PRN
  Administered 2019-12-23: 500 [IU]
  Filled 2019-12-23: qty 5

## 2019-12-23 NOTE — Patient Instructions (Signed)
Coronavirus (COVID-19) Are you at risk?  Are you at risk for the Coronavirus (COVID-19)?  To be considered HIGH RISK for Coronavirus (COVID-19), you have to meet the following criteria:  . Traveled to [place], [place], [place], [place] or [place]; or in the [place] to [place], [place], [place], or [place]; and have fever, cough, and shortness of breath within the last 2 weeks of travel OR . Been in close contact with a person diagnosed with COVID-19 within the last 2 weeks and have fever, cough, and shortness of breath . IF YOU DO NOT MEET THESE CRITERIA, YOU ARE CONSIDERED LOW RISK FOR COVID-19.  What to do if you are HIGH RISK for COVID-19?  Marland Kitchen If you are having a medical emergency, call 911. . Seek medical care right away. Before you go to a doctor's office, urgent care or emergency department, call ahead and tell them about your recent travel, contact with someone diagnosed with COVID-19, and your symptoms. You should receive instructions from your physician's office regarding next steps of care.  . When you arrive at healthcare provider, tell the healthcare staff immediately you have returned from visiting [place], [place], [place], [place] or [place]; or traveled in the [place] to [place], [place], [place], or [place]; in the last two weeks or you have been in close contact with a person diagnosed with COVID-19 in the last 2 weeks.   . Tell the health care staff about your symptoms: fever, cough and shortness of breath. . After you have been seen by a medical provider, you will be either: o Tested for (COVID-19) and discharged home on quarantine except to seek medical care if symptoms worsen, and asked to  - Stay home and avoid contact with others until you get your results (4-5 days)  - Avoid travel on public transportation if possible (such as bus, train, or airplane) or o Sent to the Emergency Department by EMS for evaluation, COVID-19 testing, and possible  admission depending on your condition and test results.  What to do if you are LOW RISK for COVID-19?  Reduce your risk of any infection by using the same precautions used for avoiding the common cold or flu:  Marland Kitchen Wash your hands often with soap and warm water for at least 20 seconds.  If soap and water are not readily available, use an alcohol-based hand sanitizer with at least 60% alcohol.  . If coughing or sneezing, cover your mouth and nose by coughing or sneezing into the elbow areas of your shirt or coat, into a tissue or into your sleeve (not your hands). . Avoid shaking hands with others and consider head nods or verbal greetings only. . Avoid touching your eyes, nose, or mouth with unwashed hands.  . Avoid close contact with people who are sick. . Avoid places or events with large numbers of people in one location, like concerts or sporting events. . Carefully consider travel plans you have or are making. . If you are planning any travel outside or inside the [place], visit the CDC's Travelers' Health webpage for the latest health notices. . If you have some symptoms but not all symptoms, continue to monitor at home and seek medical attention if your symptoms worsen. . If you are having a medical emergency, call 911.   [place] / e-Visit: [email or web address]         [facility]: [place]  [facility]: [facility]: [place] Discharge Instructions for Patients Receiving Chemotherapy  Today you received the following chemotherapy agents Imfinzi  To help prevent nausea and vomiting after your treatment, we encourage you to take your nausea medication as directed.    If you develop nausea and vomiting that is not controlled by your nausea medication, call the clinic.   BELOW ARE  SYMPTOMS THAT SHOULD BE REPORTED IMMEDIATELY:  *FEVER GREATER THAN 100.5 F  *CHILLS WITH OR WITHOUT FEVER  NAUSEA AND VOMITING THAT IS NOT CONTROLLED WITH YOUR NAUSEA MEDICATION  *UNUSUAL SHORTNESS OF BREATH  *UNUSUAL BRUISING OR BLEEDING  TENDERNESS IN MOUTH AND THROAT WITH OR WITHOUT PRESENCE OF ULCERS  *URINARY PROBLEMS  *BOWEL PROBLEMS  UNUSUAL RASH Items with * indicate a potential emergency and should be followed up as soon as possible.  Feel free to call the clinic should you have any questions or concerns. The clinic phone number is [phone].  Please show the [place] at check-in to the Emergency Department and triage nurse.

## 2019-12-23 NOTE — Progress Notes (Signed)
[place] [phone]   [phone]  OFFICE PROGRESS NOTE  Celene Squibb, MD [address] [place] [address]  DIAGNOSIS: stage IIIA (T2b, N2, M0) non-small cell lung cancer, squamous cell carcinoma presented with right upper lobe lung mass in addition to right paratracheal lymphadenopathy diagnosed in December 2019. The patient also has a hypermetabolic lesion in the left parotid gland that need further evaluation.  PRIOR THERAPY:  concurrent chemoradiation with weekly carboplatin for AUC of 2 and paclitaxel 45 mg/M2.  First dose started on 12/02/2018. Status post 6 cycles.  Last cycle was given on January 05, 2019 the patient is receiving radiation closer to home in [place], [place] completed January 12, 2019.   CURRENT THERAPY:  Consolidation treatment with immunotherapy with Imfinzi 10 mg/KG IV every 2 weeks.  First dose was given February 05, 2019. Status post 23 cycles.  INTERVAL HISTORY: Alexander Hatfield. 64 y.o. male returns to the clinic today for follow-up visit.  The patient is feeling fine today with no concerning complaints.  He denied having any current chest pain, shortness of breath, cough or hemoptysis.  He denied having any nausea, vomiting, diarrhea or constipation.  He plays golf at regular basis.  He continues to tolerate his treatment with Imfinzi fairly well.  The patient is here today for evaluation before starting cycle #24 of his treatment.    MEDICAL HISTORY: Past Medical History:  Diagnosis Date  . Arthritis   . Chronic back pain   . Chronic back pain   . Headache   . HOH (hard of hearing)   . Hypertension   . Lung mass   . Pre-diabetes   . Sleep apnea    wears cpap  . Wears glasses   . Wears partial dentures     ALLERGIES:  is allergic to iohexol.  MEDICATIONS:  Current Outpatient Medications  Medication Sig Dispense Refill  . albuterol (PROVENTIL) (2.5 MG/3ML) 0.083% nebulizer solution VVN QID  PRN    . amLODipine (NORVASC) 10 MG tablet Take 10 mg by mouth daily.     Marland Kitchen atorvastatin (LIPITOR) 40 MG tablet Take 40 mg by mouth at bedtime.    . baclofen (LIORESAL) 10 MG tablet Take 10 mg by mouth 3 (three) times daily.    Marland Kitchen BELBUCA 450 MCG FILM Take 600 mcg by mouth 2 (two) times daily.     Marland Kitchen BREO ELLIPTA 200-25 MCG/INH AEPB Inhale 1 puff into the lungs daily.    . D3-50 1.25 MG (50000 UT) capsule Take 50,000 Units by mouth once a week.    . furosemide (LASIX) 20 MG tablet Take 20 mg by mouth daily.     Marland Kitchen gabapentin (NEURONTIN) 300 MG capsule Take 600 mg by mouth 3 (three) times daily. Pt states taking 3Tablets- 397m tablet TID    . guaifenesin (ROBITUSSIN) 100 MG/5ML syrup Take 200 mg by mouth 3 (three) times daily as needed for cough.    .Marland Kitchenipratropium-albuterol (DUONEB) 0.5-2.5 (3) MG/3ML SOLN SMARTSIG:1 Vial(s) Via Nebulizer 4 Times Daily PRN    . lidocaine-prilocaine (EMLA) cream Apply 1 application topically as needed. 30 g 0  . losartan (COZAAR) 100 MG tablet Take 100 mg by mouth daily.     . magic mouthwash w/lidocaine SOLN Take by mouth.    . meloxicam (MOBIC) 15 MG tablet Take 15 mg by mouth daily.     .Marland KitchenNARCAN 4 MG/0.1ML LIQD nasal spray kit  Place 1 spray into the nose See admin instructions. Administer a single spray in one nostril upon signs of opioid overdose.  Call 911.  Repeat after 3 minutes if no response.    . oxyCODONE-acetaminophen (PERCOCET) 10-325 MG tablet Take 1 tablet by mouth 4 (four) times daily as needed.    . potassium chloride SA (K-DUR,KLOR-CON) 20 MEQ tablet Take 20 mEq by mouth every evening.     . prochlorperazine (COMPAZINE) 10 MG tablet Take 1 tablet (10 mg total) by mouth every 6 (six) hours as needed for nausea or vomiting. 30 tablet 0  . Umeclidinium-Vilanterol (ANORO ELLIPTA IN) Inhale into the lungs.     No current facility-administered medications for this visit.    SURGICAL HISTORY:  Past Surgical History:  Procedure Laterality Date  .  ANTERIOR CERVICAL DECOMP/DISCECTOMY FUSION N/A 10/27/2018   Procedure: ANTERIOR CERVICAL THREE-FOUR, FOUR-FIVE DECOMPRESSION/DISCECTOMY FUSION TWO LEVELS;  Surgeon: Kary Kos, MD;  Location: [facility];  Service: Neurosurgery;  Laterality: N/A;   ANTERIOR CERVICAL THREE-FOUR, FOUR-FIVE DECOMPRESSION/DISCECTOMY FUSION TWO LEVELS  . BACK SURGERY    . IR IMAGING GUIDED PORT INSERTION  11/27/2018  . MULTIPLE TOOTH EXTRACTIONS    . SPINAL CORD STIMULATOR IMPLANT    . VIDEO BRONCHOSCOPY N/A 10/29/2018   Procedure: VIDEO BRONCHOSCOPY;  Surgeon: Laurin Coder, MD;  Location[facility];  Service: Thoracic;  Laterality: N/A;  . VIDEO BRONCHOSCOPY WITH ENDOBRONCHIAL ULTRASOUND N/A 10/27/2018   Procedure: VIDEO BRONCHOSCOPY WITH ENDOBRONCHIAL ULTRASOUND;  Surgeon: Laurin Coder, MD;  Location: [facility];  Service: Thoracic;  Laterality: N/A;  . VIDEO BRONCHOSCOPY WITH ENDOBRONCHIAL ULTRASOUND N/A 10/29/2018   Procedure: VIDEO BRONCHOSCOPY WITH ENDOBRONCHIAL ULTRASOUND;  Surgeon: Laurin Coder, MD;  Location: [facility];  Service: Thoracic;  Laterality: N/A;    REVIEW OF SYSTEMS:  A comprehensive review of systems was negative.   PHYSICAL EXAMINATION: General appearance: alert, cooperative and no distress Head: Normocephalic, without obvious abnormality, atraumatic Neck: no adenopathy, no JVD, supple, symmetrical, trachea midline and thyroid not enlarged, symmetric, no tenderness/mass/nodules Lymph nodes: Cervical, supraclavicular, and axillary nodes normal. Resp: clear to auscultation bilaterally Back: symmetric, no curvature. ROM normal. No CVA tenderness. Cardio: regular rate and rhythm, S1, S2 normal, no murmur, click, rub or gallop GI: soft, non-tender; bowel sounds normal; no masses,  no organomegaly Extremities: extremities normal, atraumatic, no cyanosis or edema  ECOG PERFORMANCE STATUS: 1 - Symptomatic but completely ambulatory  Blood pressure 121/71, pulse 84, temperature 98.9 F (37.2 C),  temperature source Temporal, resp. rate 18, height 6' 1" (1.854 m), weight (!) 341 lb (154.7 kg), SpO2 96 %.  LABORATORY DATA: Lab Results  Component Value Date   WBC 6.9 12/23/2019   HGB 13.6 12/23/2019   HCT 43.0 12/23/2019   MCV 95.8 12/23/2019   PLT 255 12/23/2019      Chemistry      Component Value Date/Time   NA 144 12/10/2019 1301   K 4.0 12/10/2019 1301   CL 105 12/10/2019 1301   CO2 29 12/10/2019 1301   BUN 12 12/10/2019 1301   CREATININE 0.83 12/10/2019 1301      Component Value Date/Time   CALCIUM 9.0 12/10/2019 1301   ALKPHOS 109 12/10/2019 1301   AST 13 (L) 12/10/2019 1301   ALT 12 12/10/2019 1301   BILITOT 0.4 12/10/2019 1301       RADIOGRAPHIC STUDIES: US ARTERIAL LOWER EXTREMITY DUPLEX BILATERAL  Result Date: 11/24/2019 CLINICAL DATA:  Bilateral lower extremity chronic pain. ABIs on 10/13/2019 was  Rt = 0.73 and Lt = .75. EXAM: BILATERAL LOWER EXTREMITY ARTERIAL DUPLEX SCAN TECHNIQUE: Gray-scale sonography as well as color Doppler and duplex ultrasound was performed to evaluate the arteries of both lower extremities including the common, superficial and profunda femoral arteries, popliteal artery and calf arteries. COMPARISON:  Ankle-brachial indices 10/13/2019 FINDINGS: Right Lower Extremity ABI: Not obtained Inflow: Echogenic calcified plaque in the right common femoral artery with biphasic/monophasic waveforms and elevated peak systolic velocity in the distal right common femoral artery that measures 335 cm/sec. Outflow: Right profunda femoral artery is patent with elevated velocity in the proximal aspect measuring 254 cm/sec . Atherosclerotic plaque in the proximal SFA with elevated peak systolic velocity measuring 270 cm/sec. Monophasic waveforms throughout the right SFA. Extensive atherosclerotic plaque in the right popliteal artery with monophasic waveforms and no significant velocity elevation. Runoff: Monophasic waveforms in the right anterior tibial artery,  posterior tibial artery and peroneal artery. Left Lower Extremity ABI: Not obtained Inflow: Atherosclerotic plaque in left common femoral artery with monophasic and biphasic waveforms. No significant velocity elevation in left common femoral artery. Outflow: Left profunda femoral artery is patent with monophasic waveforms. Left SFA is patent with predominantly monophasic waveforms and no focal velocity elevations. Left popliteal artery is patent with monophasic waveforms. Runoff: Monophasic waveforms in the left anterior tibial artery, posterior tibial artery and peroneal artery. IMPRESSION: 1. Atherosclerotic disease in the bilateral lower extremity arteries. Bilateral lower extremity arteries are patent. 2. Evidence for hemodynamically significant stenosis involving the distal right common femoral artery, proximal right superficial femoral artery and right profunda femoral artery. Electronically Signed   By: Markus Daft M.D.   On: 11/24/2019 08:41    ASSESSMENT AND PLAN: This is a very pleasant 64 years old white male with a stage IIIa non-small cell lung cancer, squamous cell carcinoma diagnosed in December 2019. The patient completed a course of treatment with concurrent chemoradiation with weekly carboplatin and paclitaxel status post 6 cycles.  He had partial response to this treatment. He was started on consolidation treatment with Imfinzi status post 23 cycles. The patient continues to tolerate his treatment well with no concerning adverse effects. I recommended for him to proceed with cycle #24 today as planned. I will see him back for follow-up visit in 2 weeks for evaluation before cycle #25. For the peripheral vascular disease he will continue his routine follow-up visit and evaluation by Dr. Johnsie Cancel For the chronic back pain he will continue his follow-up visit and evaluation with the [facility] pain clinic. The patient was advised to call immediately if he has any concerning symptoms in the  interval. The patient voices understanding of current disease status and treatment options and is in agreement with the current care plan. All questions were answered. The patient knows to call the clinic with any problems, questions or concerns. We can certainly see the patient much sooner if necessary.  Disclaimer: This note was dictated with voice recognition software. Similar sounding words can inadvertently be transcribed and may not be corrected upon review.

## 2019-12-24 ENCOUNTER — Ambulatory Visit: Payer: Medicare Other

## 2019-12-24 ENCOUNTER — Other Ambulatory Visit: Payer: Medicare Other

## 2019-12-24 ENCOUNTER — Ambulatory Visit: Payer: Medicare Other | Admitting: Internal Medicine

## 2019-12-31 ENCOUNTER — Ambulatory Visit (HOSPITAL_COMMUNITY): Payer: Medicare Other

## 2020-01-06 NOTE — Progress Notes (Signed)
[facility] OFFICE PROGRESS NOTE  Celene Squibb, MD [address] [place] [address]  DIAGNOSIS: Stage IIIA(T2b, N2, M0) non-small cell lung cancer, squamous cell carcinoma presented with right upper lobe lung mass in addition to right paratracheal lymphadenopathy diagnosed in December 2019. The patient also has a hypermetabolic lesion in the left parotid gland that need further evaluation.  PRIOR THERAPY: Concurrent chemoradiation with weekly carboplatin for AUC of 2 and paclitaxel 45 mg/M2.First dose started on 12/02/2018.Status post 6 cycles. Last cycle was given on January 05, 2019 the patient is receiving radiation closer to home in [place], [place] completed January 12, 2019.  CURRENT THERAPY: Consolidation treatment with immunotherapy with Imfinzi 10 mg/KG IV every 2 weeks. First dose was given February 05, 2019. Status post24cycles.  INTERVAL HISTORY: Alexander Hatfield. 64 y.o. male returns the clinic for a follow up visit. The patient is feeling well today without any concerning complaints. He reports his continued neuropathy secondary to his multiple back injuries. He also is seeing a vascular practice to assess possible PAD. He has an appointment next week. Regarding his treatment with immunotherapy with Imfinzi, the patient continues to tolerate treatment well without any adversesideeffects. Denies any fever, chills, night sweats, or weight loss. Denies any chest pain, cough, or hemoptysis. He still is reporting his baseline shortness of breath with exertion. He states he has noticed his wheezing has been better this month. Denies any nausea, vomiting, diarrhea, or constipation. Denies any headache or visual changes. Denies any rashes or skin changes. The patient is here today for evaluation prior to starting cycle # 25  MEDICAL HISTORY: Past Medical History:  Diagnosis Date  . Arthritis   . Chronic back pain   . Chronic back pain   . Headache   . HOH  (hard of hearing)   . Hypertension   . Lung mass   . Pre-diabetes   . Sleep apnea    wears cpap  . Wears glasses   . Wears partial dentures     ALLERGIES:  is allergic to iohexol.  MEDICATIONS:  Current Outpatient Medications  Medication Sig Dispense Refill  . albuterol (PROVENTIL) (2.5 MG/3ML) 0.083% nebulizer solution VVN QID PRN    . amLODipine (NORVASC) 10 MG tablet Take 10 mg by mouth daily.     Marland Kitchen atorvastatin (LIPITOR) 40 MG tablet Take 40 mg by mouth at bedtime.    . baclofen (LIORESAL) 10 MG tablet Take 10 mg by mouth 3 (three) times daily.    Marland Kitchen BELBUCA 450 MCG FILM Take 600 mcg by mouth 2 (two) times daily.     Marland Kitchen BREO ELLIPTA 200-25 MCG/INH AEPB Inhale 1 puff into the lungs daily.    . D3-50 1.25 MG (50000 UT) capsule Take 50,000 Units by mouth once a week.    . furosemide (LASIX) 20 MG tablet Take 20 mg by mouth daily.     Marland Kitchen gabapentin (NEURONTIN) 300 MG capsule Take 600 mg by mouth 3 (three) times daily. Pt states taking 3Tablets- 329m tablet TID    . guaifenesin (ROBITUSSIN) 100 MG/5ML syrup Take 200 mg by mouth 3 (three) times daily as needed for cough.    .Marland Kitchenipratropium-albuterol (DUONEB) 0.5-2.5 (3) MG/3ML SOLN SMARTSIG:1 Vial(s) Via Nebulizer 4 Times Daily PRN    . lidocaine-prilocaine (EMLA) cream Apply 1 application topically as needed. 30 g 0  . losartan (COZAAR) 100 MG tablet Take 100 mg by mouth daily.     .Marland Kitchen  magic mouthwash w/lidocaine SOLN Take by mouth.    . meloxicam (MOBIC) 15 MG tablet Take 15 mg by mouth daily.     Marland Kitchen NARCAN 4 MG/0.1ML LIQD nasal spray kit Place 1 spray into the nose See admin instructions. Administer a single spray in one nostril upon signs of opioid overdose.  Call 911.  Repeat after 3 minutes if no response.    . oxyCODONE-acetaminophen (PERCOCET) 10-325 MG tablet Take 1 tablet by mouth 4 (four) times daily as needed.    . potassium chloride SA (K-DUR,KLOR-CON) 20 MEQ tablet Take 20 mEq by mouth every evening.     . prochlorperazine  (COMPAZINE) 10 MG tablet Take 1 tablet (10 mg total) by mouth every 6 (six) hours as needed for nausea or vomiting. 30 tablet 0  . Umeclidinium-Vilanterol (ANORO ELLIPTA IN) Inhale into the lungs.     No current facility-administered medications for this visit.    SURGICAL HISTORY:  Past Surgical History:  Procedure Laterality Date  . ANTERIOR CERVICAL DECOMP/DISCECTOMY FUSION N/A 10/27/2018   Procedure: ANTERIOR CERVICAL THREE-FOUR, FOUR-FIVE DECOMPRESSION/DISCECTOMY FUSION TWO LEVELS;  Surgeon: Kary Kos, MD;  Location: [facility];  Service: Neurosurgery;  Laterality: N/A;   ANTERIOR CERVICAL THREE-FOUR, FOUR-FIVE DECOMPRESSION/DISCECTOMY FUSION TWO LEVELS  . BACK SURGERY    . IR IMAGING GUIDED PORT INSERTION  11/27/2018  . MULTIPLE TOOTH EXTRACTIONS    . SPINAL CORD STIMULATOR IMPLANT    . VIDEO BRONCHOSCOPY N/A 10/29/2018   Procedure: VIDEO BRONCHOSCOPY;  Surgeon: Laurin Coder, MD;  Location: [facility];  Service: Thoracic;  Laterality: N/A;  . VIDEO BRONCHOSCOPY WITH ENDOBRONCHIAL ULTRASOUND N/A 10/27/2018   Procedure: VIDEO BRONCHOSCOPY WITH ENDOBRONCHIAL ULTRASOUND;  Surgeon: Laurin Coder, MD;  Location: [facility];  Service: Thoracic;  Laterality: N/A;  . VIDEO BRONCHOSCOPY WITH ENDOBRONCHIAL ULTRASOUND N/A 10/29/2018   Procedure: VIDEO BRONCHOSCOPY WITH ENDOBRONCHIAL ULTRASOUND;  Surgeon: Laurin Coder, MD;  Location: [facility];  Service: Thoracic;  Laterality: N/A;    REVIEW OF SYSTEMS:   Review of Systems  Constitutional: Negative for appetite change, chills, fatigue, fever and unexpected weight change.  HENT: Negative for mouth sores, nosebleeds, sore throat and trouble swallowing.   Eyes: Negative for eye problems and icterus.  Respiratory: Positive for dyspnea on exertion. Negative for cough, hemoptysis, and wheezing.   Cardiovascular: Positive for calf claudication. Negative for chest pain and leg swelling.  Gastrointestinal: Negative for abdominal pain, constipation,  diarrhea, nausea and vomiting.  Genitourinary: Negative for bladder incontinence, difficulty urinating, dysuria, frequency and hematuria.   Musculoskeletal: Negative for back pain, gait problem, neck pain and neck stiffness.  Skin: Negative for itching and rash.  Neurological: Possible for neuropathy in lower extremities. Negative for dizziness, extremity weakness, gait problem, headaches, light-headedness and seizures.  Hematological: Negative for adenopathy. Does not bruise/bleed easily.  Psychiatric/Behavioral: Negative for confusion, depression and sleep disturbance. The patient is not nervous/anxious.     PHYSICAL EXAMINATION:  There were no vitals taken for this visit.  ECOG PERFORMANCE STATUS: 1 - Symptomatic but completely ambulatory  Physical Exam  Constitutional: Oriented to person, place, and time and well-developed, well-nourished, and in no distress.  HENT:  Head: Normocephalic and atraumatic.  Mouth/Throat: Oropharynx is clear and moist. No oropharyngeal exudate.  Eyes: Conjunctivae are normal. Right eye exhibits no discharge. Left eye exhibits no discharge. No scleral icterus.  Neck: Normal range of motion. Neck supple.  Cardiovascular: Normal rate, regular rhythm, normal heart sounds and intact distal pulses.   Pulmonary/Chest: Effort normal  and breath sounds normal. No respiratory distress. No wheezes. No rales.  Abdominal: Soft. Bowel sounds are normal. Exhibits no distension and no mass. There is no tenderness.  Musculoskeletal: Positive for chronic back pain.Normal range of motion. Exhibits no edema.  Lymphadenopathy:    No cervical adenopathy.  Neurological: Alert and oriented to person, place, and time. Exhibits normal muscle tone. Gait normal. Coordination normal.  Skin: Skin is warm and dry. No rash noted. Not diaphoretic. No erythema. No pallor.  Psychiatric: Mood, memory and judgment normal.  Vitals reviewed.  LABORATORY DATA: Lab Results  Component Value  Date   WBC 6.9 12/23/2019   HGB 13.6 12/23/2019   HCT 43.0 12/23/2019   MCV 95.8 12/23/2019   PLT 255 12/23/2019      Chemistry      Component Value Date/Time   NA 145 12/23/2019 1000   K 4.4 12/23/2019 1000   CL 107 12/23/2019 1000   CO2 29 12/23/2019 1000   BUN 16 12/23/2019 1000   CREATININE 0.92 12/23/2019 1000      Component Value Date/Time   CALCIUM 9.1 12/23/2019 1000   ALKPHOS 96 12/23/2019 1000   AST 13 (L) 12/23/2019 1000   ALT 14 12/23/2019 1000   BILITOT 0.4 12/23/2019 1000       RADIOGRAPHIC STUDIES:  No results found.   ASSESSMENT/PLAN:  This isa very pleasant 64 year old Caucasian male with stage III non-small cell lung cancer, squamous cell carcinoma. He presented with a right upper lobe lung mass in addition to right paratracheal lymphadenopathy. He was diagnosed in December 2019.  The patient completed a course of concurrent chemoradiation with weekly carboplatin and paclitaxel. He is status post 6 cycles. Hehad a partial response to treatment.  The patient is currently undergoing consolidation immunotherapy with Imfinzi 10 mg/kg IV every 2 weeks. He is status post24cycles. He has been tolerating treatment well without any adverse side effects.  Labs were reviewed. Recommend that he proceed with cycle #25 today as scheduled.   We will see him back for a follow up visit in 2 weeks for evaluation before starting cycle #26.   The patient was advised to call immediately if he has any concerning symptoms in the interval. The patient voices understanding of current disease status and treatment options and is in agreement with the current care plan. All questions were answered. The patient knows to call the clinic with any problems, questions or concerns. We can certainly see the patient much sooner if necessary      No orders of the defined types were placed in this encounter.    Delyla Sandeen L Reymond Maynez, PA-C 01/06/20

## 2020-01-07 ENCOUNTER — Inpatient Hospital Stay (HOSPITAL_BASED_OUTPATIENT_CLINIC_OR_DEPARTMENT_OTHER): Payer: Medicare Other | Admitting: Physician Assistant

## 2020-01-07 ENCOUNTER — Inpatient Hospital Stay: Payer: Medicare Other

## 2020-01-07 ENCOUNTER — Other Ambulatory Visit: Payer: Self-pay

## 2020-01-07 ENCOUNTER — Encounter: Payer: Self-pay | Admitting: Physician Assistant

## 2020-01-07 VITALS — BP 122/71 | HR 89 | Temp 98.7°F | Resp 19 | Ht 73.0 in | Wt 339.5 lb

## 2020-01-07 DIAGNOSIS — Z5112 Encounter for antineoplastic immunotherapy: Secondary | ICD-10-CM

## 2020-01-07 DIAGNOSIS — C3491 Malignant neoplasm of unspecified part of right bronchus or lung: Secondary | ICD-10-CM | POA: Diagnosis not present

## 2020-01-07 DIAGNOSIS — Z95828 Presence of other vascular implants and grafts: Secondary | ICD-10-CM

## 2020-01-07 DIAGNOSIS — R635 Abnormal weight gain: Secondary | ICD-10-CM

## 2020-01-07 LAB — CBC WITH DIFFERENTIAL (CANCER CENTER ONLY)
Abs Immature Granulocytes: 0.02 10*3/uL (ref 0.00–0.07)
Basophils Absolute: 0 10*3/uL (ref 0.0–0.1)
Basophils Relative: 1 %
Eosinophils Absolute: 0.4 10*3/uL (ref 0.0–0.5)
Eosinophils Relative: 5 %
HCT: 42.5 % (ref 39.0–52.0)
Hemoglobin: 13.6 g/dL (ref 13.0–17.0)
Immature Granulocytes: 0 %
Lymphocytes Relative: 17 %
Lymphs Abs: 1.4 10*3/uL (ref 0.7–4.0)
MCH: 29.9 pg (ref 26.0–34.0)
MCHC: 32 g/dL (ref 30.0–36.0)
MCV: 93.4 fL (ref 80.0–100.0)
Monocytes Absolute: 0.8 10*3/uL (ref 0.1–1.0)
Monocytes Relative: 10 %
Neutro Abs: 5.2 10*3/uL (ref 1.7–7.7)
Neutrophils Relative %: 67 %
Platelet Count: 250 10*3/uL (ref 150–400)
RBC: 4.55 MIL/uL (ref 4.22–5.81)
RDW: 13.5 % (ref 11.5–15.5)
WBC Count: 7.8 10*3/uL (ref 4.0–10.5)
nRBC: 0 % (ref 0.0–0.2)

## 2020-01-07 LAB — CMP (CANCER CENTER ONLY)
ALT: 13 U/L (ref 0–44)
AST: 14 U/L — ABNORMAL LOW (ref 15–41)
Albumin: 3.8 g/dL (ref 3.5–5.0)
Alkaline Phosphatase: 119 U/L (ref 38–126)
Anion gap: 11 (ref 5–15)
BUN: 11 mg/dL (ref 8–23)
CO2: 29 mmol/L (ref 22–32)
Calcium: 9.1 mg/dL (ref 8.9–10.3)
Chloride: 106 mmol/L (ref 98–111)
Creatinine: 0.89 mg/dL (ref 0.61–1.24)
GFR, Est AFR Am: >60 mL/min (ref >60)
GFR, Estimated: >60 mL/min (ref >60)
Glucose, Bld: 102 mg/dL — ABNORMAL HIGH (ref 70–99)
Potassium: 4.3 mmol/L (ref 3.5–5.1)
Sodium: 146 mmol/L — ABNORMAL HIGH (ref 135–145)
Total Bilirubin: 0.5 mg/dL (ref 0.3–1.2)
Total Protein: 7.2 g/dL (ref 6.5–8.1)

## 2020-01-07 LAB — TSH: TSH: 1.007 u[IU]/mL (ref 0.320–4.118)

## 2020-01-07 MED ORDER — SODIUM CHLORIDE 0.9 % IV SOLN
1500.0000 mg | Freq: Once | INTRAVENOUS | Status: AC
  Administered 2020-01-07: 1500 mg via INTRAVENOUS
  Filled 2020-01-07: qty 30

## 2020-01-07 MED ORDER — SODIUM CHLORIDE 0.9% FLUSH
10.0000 mL | INTRAVENOUS | Status: DC | PRN
  Administered 2020-01-07: 10 mL
  Filled 2020-01-07: qty 10

## 2020-01-07 MED ORDER — HEPARIN SOD (PORK) LOCK FLUSH 100 UNIT/ML IV SOLN
500.0000 [IU] | Freq: Once | INTRAVENOUS | Status: AC | PRN
  Administered 2020-01-07: 12:00:00 500 [IU]
  Filled 2020-01-07: qty 5

## 2020-01-07 MED ORDER — SODIUM CHLORIDE 0.9 % IV SOLN
Freq: Once | INTRAVENOUS | Status: AC
  Filled 2020-01-07: qty 250

## 2020-01-07 MED ORDER — SODIUM CHLORIDE 0.9% FLUSH
10.0000 mL | Freq: Once | INTRAVENOUS | Status: AC
  Administered 2020-01-07: 10:00:00 10 mL
  Filled 2020-01-07: qty 10

## 2020-01-07 NOTE — Patient Instructions (Signed)

## 2020-01-07 NOTE — Patient Instructions (Signed)
[facility] Discharge Instructions for Patients Receiving Chemotherapy  Today you received the following Immunotherapy agent: Durvalumab (Imfinzi)  To help prevent nausea and vomiting after your treatment, we encourage you to take your nausea medication as directed by your MD.   If you develop nausea and vomiting that is not controlled by your nausea medication, call the clinic.   BELOW ARE SYMPTOMS THAT SHOULD BE REPORTED IMMEDIATELY:  *FEVER GREATER THAN 100.5 F  *CHILLS WITH OR WITHOUT FEVER  NAUSEA AND VOMITING THAT IS NOT CONTROLLED WITH YOUR NAUSEA MEDICATION  *UNUSUAL SHORTNESS OF BREATH  *UNUSUAL BRUISING OR BLEEDING  TENDERNESS IN MOUTH AND THROAT WITH OR WITHOUT PRESENCE OF ULCERS  *URINARY PROBLEMS  *BOWEL PROBLEMS  UNUSUAL RASH Items with * indicate a potential emergency and should be followed up as soon as possible.  Feel free to call the clinic should you have any questions or concerns. The clinic phone number is [phone].  Please show the [facility] at check-in to the Emergency Department and triage nurse.  Coronavirus (COVID-19) Are you at risk?  Are you at risk for the Coronavirus (COVID-19)?  To be considered HIGH RISK for Coronavirus (COVID-19), you have to meet the following criteria:  . Traveled to [place], [place], [place], [place] or [place]; or in the [place] to [place], [place], [place], or [place]; and have fever, cough, and shortness of breath within the last 2 weeks of travel OR . Been in close contact with a person diagnosed with COVID-19 within the last 2 weeks and have fever, cough, and shortness of breath . IF YOU DO NOT MEET THESE CRITERIA, YOU ARE CONSIDERED LOW RISK FOR COVID-19.  What to do if you are HIGH RISK for COVID-19?  Marland Kitchen If you are having a medical emergency, call 911. . Seek medical care right away. Before you go to a doctor's office, urgent care or emergency department, call ahead and  tell them about your recent travel, contact with someone diagnosed with COVID-19, and your symptoms. You should receive instructions from your physician's office regarding next steps of care.  . When you arrive at healthcare provider, tell the healthcare staff immediately you have returned from visiting [place], [place], [place], [place] or [place]; or traveled in the [place] to [place], [place], [place], or [place]; in the last two weeks or you have been in close contact with a person diagnosed with COVID-19 in the last 2 weeks.   . Tell the health care staff about your symptoms: fever, cough and shortness of breath. . After you have been seen by a medical provider, you will be either: o Tested for (COVID-19) and discharged home on quarantine except to seek medical care if symptoms worsen, and asked to  - Stay home and avoid contact with others until you get your results (4-5 days)  - Avoid travel on public transportation if possible (such as bus, train, or airplane) or o Sent to the Emergency Department by EMS for evaluation, COVID-19 testing, and possible admission depending on your condition and test results.  What to do if you are LOW RISK for COVID-19?  Reduce your risk of any infection by using the same precautions used for avoiding the common cold or flu:  Marland Kitchen Wash your hands often with soap and warm water for at least 20 seconds.  If soap and water are not readily available, use an alcohol-based hand sanitizer with at least 60% alcohol.  Marland Kitchen  If coughing or sneezing, cover your mouth and nose by coughing or sneezing into the elbow areas of your shirt or coat, into a tissue or into your sleeve (not your hands). . Avoid shaking hands with others and consider head nods or verbal greetings only. . Avoid touching your eyes, nose, or mouth with unwashed hands.  . Avoid close contact with people who are sick. . Avoid places or events with large numbers of people in one location, like  concerts or sporting events. . Carefully consider travel plans you have or are making. . If you are planning any travel outside or inside the [place], visit the CDC's Travelers' Health webpage for the latest health notices. . If you have some symptoms but not all symptoms, continue to monitor at home and seek medical attention if your symptoms worsen. . If you are having a medical emergency, call 911.   [place] / e-Visit: [email or web address]         [facility]: [facility]: [phone]                   [facility]: [phone]

## 2020-01-08 ENCOUNTER — Telehealth: Payer: Self-pay | Admitting: Physician Assistant

## 2020-01-08 NOTE — Telephone Encounter (Signed)
Checked pt out and kept schedule as is per 2/25 los

## 2020-01-11 ENCOUNTER — Other Ambulatory Visit: Payer: Self-pay

## 2020-01-11 ENCOUNTER — Ambulatory Visit (HOSPITAL_COMMUNITY)
Admission: RE | Admit: 2020-01-11 | Discharge: 2020-01-11 | Disposition: A | Discharge status: Home / Self Care | Payer: Medicare Other | Source: Ambulatory Visit | Attending: Cardiovascular Disease | Admitting: Cardiovascular Disease

## 2020-01-11 DIAGNOSIS — I739 Peripheral vascular disease, unspecified: Secondary | ICD-10-CM | POA: Insufficient documentation

## 2020-01-12 ENCOUNTER — Telehealth: Payer: Self-pay

## 2020-01-12 NOTE — Telephone Encounter (Signed)
Called pt and spoke to wife per DPR. Moved appt to earlier date per Dr. Gwenlyn Found request.

## 2020-01-21 ENCOUNTER — Inpatient Hospital Stay: Payer: Medicare Other | Attending: Internal Medicine | Admitting: Internal Medicine

## 2020-01-21 ENCOUNTER — Inpatient Hospital Stay: Payer: Medicare Other

## 2020-01-21 ENCOUNTER — Other Ambulatory Visit: Payer: Self-pay

## 2020-01-21 ENCOUNTER — Encounter: Payer: Self-pay | Admitting: Internal Medicine

## 2020-01-21 VITALS — BP 111/75 | HR 82 | Temp 99.1°F | Resp 18 | Ht 73.0 in | Wt 339.7 lb

## 2020-01-21 DIAGNOSIS — I10 Essential (primary) hypertension: Secondary | ICD-10-CM | POA: Diagnosis not present

## 2020-01-21 DIAGNOSIS — I739 Peripheral vascular disease, unspecified: Secondary | ICD-10-CM | POA: Diagnosis not present

## 2020-01-21 DIAGNOSIS — Z5112 Encounter for antineoplastic immunotherapy: Secondary | ICD-10-CM | POA: Diagnosis present

## 2020-01-21 DIAGNOSIS — Z95828 Presence of other vascular implants and grafts: Secondary | ICD-10-CM

## 2020-01-21 DIAGNOSIS — C3491 Malignant neoplasm of unspecified part of right bronchus or lung: Secondary | ICD-10-CM

## 2020-01-21 DIAGNOSIS — C3411 Malignant neoplasm of upper lobe, right bronchus or lung: Secondary | ICD-10-CM | POA: Insufficient documentation

## 2020-01-21 DIAGNOSIS — C3490 Malignant neoplasm of unspecified part of unspecified bronchus or lung: Secondary | ICD-10-CM | POA: Diagnosis not present

## 2020-01-21 LAB — CBC WITH DIFFERENTIAL (CANCER CENTER ONLY)
Abs Immature Granulocytes: 0.02 10*3/uL (ref 0.00–0.07)
Basophils Absolute: 0 10*3/uL (ref 0.0–0.1)
Basophils Relative: 1 %
Eosinophils Absolute: 0.4 10*3/uL (ref 0.0–0.5)
Eosinophils Relative: 5 %
HCT: 42.8 % (ref 39.0–52.0)
Hemoglobin: 13.5 g/dL (ref 13.0–17.0)
Immature Granulocytes: 0 %
Lymphocytes Relative: 16 %
Lymphs Abs: 1.2 10*3/uL (ref 0.7–4.0)
MCH: 29.8 pg (ref 26.0–34.0)
MCHC: 31.5 g/dL (ref 30.0–36.0)
MCV: 94.5 fL (ref 80.0–100.0)
Monocytes Absolute: 0.7 10*3/uL (ref 0.1–1.0)
Monocytes Relative: 10 %
Neutro Abs: 5.1 10*3/uL (ref 1.7–7.7)
Neutrophils Relative %: 68 %
Platelet Count: 251 10*3/uL (ref 150–400)
RBC: 4.53 MIL/uL (ref 4.22–5.81)
RDW: 13.4 % (ref 11.5–15.5)
WBC Count: 7.5 10*3/uL (ref 4.0–10.5)
nRBC: 0 % (ref 0.0–0.2)

## 2020-01-21 LAB — CMP (CANCER CENTER ONLY)
ALT: 12 U/L (ref 0–44)
AST: 15 U/L (ref 15–41)
Albumin: 3.8 g/dL (ref 3.5–5.0)
Alkaline Phosphatase: 124 U/L (ref 38–126)
Anion gap: 7 (ref 5–15)
BUN: 11 mg/dL (ref 8–23)
CO2: 29 mmol/L (ref 22–32)
Calcium: 9.1 mg/dL (ref 8.9–10.3)
Chloride: 105 mmol/L (ref 98–111)
Creatinine: 0.82 mg/dL (ref 0.61–1.24)
GFR, Est AFR Am: >60 mL/min (ref >60)
GFR, Estimated: >60 mL/min (ref >60)
Glucose, Bld: 104 mg/dL — ABNORMAL HIGH (ref 70–99)
Potassium: 4.3 mmol/L (ref 3.5–5.1)
Sodium: 141 mmol/L (ref 135–145)
Total Bilirubin: 0.7 mg/dL (ref 0.3–1.2)
Total Protein: 7 g/dL (ref 6.5–8.1)

## 2020-01-21 MED ORDER — SODIUM CHLORIDE 0.9% FLUSH
10.0000 mL | Freq: Once | INTRAVENOUS | Status: AC
  Administered 2020-01-21: 10 mL
  Filled 2020-01-21: qty 10

## 2020-01-21 MED ORDER — SODIUM CHLORIDE 0.9% FLUSH
10.0000 mL | INTRAVENOUS | Status: DC | PRN
  Administered 2020-01-21: 10 mL
  Filled 2020-01-21: qty 10

## 2020-01-21 MED ORDER — SODIUM CHLORIDE 0.9 % IV SOLN
Freq: Once | INTRAVENOUS | Status: AC
  Filled 2020-01-21: qty 250

## 2020-01-21 MED ORDER — SODIUM CHLORIDE 0.9 % IV SOLN
1500.0000 mg | Freq: Once | INTRAVENOUS | Status: AC
  Administered 2020-01-21: 1500 mg via INTRAVENOUS
  Filled 2020-01-21: qty 30

## 2020-01-21 MED ORDER — HEPARIN SOD (PORK) LOCK FLUSH 100 UNIT/ML IV SOLN
500.0000 [IU] | Freq: Once | INTRAVENOUS | Status: AC | PRN
  Administered 2020-01-21: 500 [IU]
  Filled 2020-01-21: qty 5

## 2020-01-21 NOTE — Progress Notes (Signed)
[place] [facility] [phone]   [phone]  OFFICE PROGRESS NOTE  Celene Squibb, MD [address] [place] [address]  DIAGNOSIS: stage IIIA (T2b, N2, M0) non-small cell lung cancer, squamous cell carcinoma presented with right upper lobe lung mass in addition to right paratracheal lymphadenopathy diagnosed in December 2019. The patient also has a hypermetabolic lesion in the left parotid gland that need further evaluation.  PRIOR THERAPY:  concurrent chemoradiation with weekly carboplatin for AUC of 2 and paclitaxel 45 mg/M2.  First dose started on 12/02/2018. Status post 6 cycles.  Last cycle was given on January 05, 2019 the patient is receiving radiation closer to home in [place], [place] completed January 12, 2019.   CURRENT THERAPY:  Consolidation treatment with immunotherapy with Imfinzi 10 mg/KG IV every 2 weeks.  First dose was given February 05, 2019. Status post 25 cycles.  INTERVAL HISTORY: Alexander Hatfield. 64 y.o. male returns to the clinic today for follow-up visit.  The patient is feeling fine today with no concerning complaints.  He was recently diagnosed with peripheral vascular disease and he is currently under the care of Dr. Gwenlyn Found for his condition.  The patient denied having any chest pain, shortness of breath, cough or hemoptysis.  He denied having any fever or chills.  He has no nausea, vomiting, diarrhea or constipation.  He denied having any headache or visual changes.  He has no recent weight loss or night sweats.  He is here today for evaluation before starting the last cycle of his consolidation treatment with immunotherapy.   MEDICAL HISTORY: Past Medical History:  Diagnosis Date   Arthritis    Chronic back pain    Chronic back pain    Headache    HOH (hard of hearing)    Hypertension    Lung mass    Pre-diabetes    Sleep apnea    wears cpap   Wears glasses    Wears partial dentures     ALLERGIES:   is allergic to iohexol.  MEDICATIONS:  Current Outpatient Medications  Medication Sig Dispense Refill   albuterol (PROVENTIL) (2.5 MG/3ML) 0.083% nebulizer solution VVN QID PRN     amLODipine (NORVASC) 10 MG tablet Take 10 mg by mouth daily.      atorvastatin (LIPITOR) 40 MG tablet Take 40 mg by mouth at bedtime.     baclofen (LIORESAL) 10 MG tablet Take 10 mg by mouth 3 (three) times daily.     BREO ELLIPTA 200-25 MCG/INH AEPB Inhale 1 puff into the lungs daily.     D3-50 1.25 MG (50000 UT) capsule Take 50,000 Units by mouth once a week.     furosemide (LASIX) 20 MG tablet Take 20 mg by mouth daily.      gabapentin (NEURONTIN) 300 MG capsule Take 600 mg by mouth 3 (three) times daily. Pt states taking 3Tablets- 319m tablet TID     guaifenesin (ROBITUSSIN) 100 MG/5ML syrup Take 200 mg by mouth 3 (three) times daily as needed for cough.     ipratropium-albuterol (DUONEB) 0.5-2.5 (3) MG/3ML SOLN SMARTSIG:1 Vial(s) Via Nebulizer 4 Times Daily PRN     lidocaine-prilocaine (EMLA) cream Apply 1 application topically as needed. 30 g 0   losartan (COZAAR) 100 MG tablet Take 100 mg by mouth daily.      magic mouthwash w/lidocaine SOLN Take by mouth.     meloxicam (MOBIC) 15 MG tablet Take 15 mg by  mouth daily.      NARCAN 4 MG/0.1ML LIQD nasal spray kit Place 1 spray into the nose See admin instructions. Administer a single spray in one nostril upon signs of opioid overdose.  Call 911.  Repeat after 3 minutes if no response.     potassium chloride SA (K-DUR,KLOR-CON) 20 MEQ tablet Take 20 mEq by mouth every evening.      prochlorperazine (COMPAZINE) 10 MG tablet Take 1 tablet (10 mg total) by mouth every 6 (six) hours as needed for nausea or vomiting. 30 tablet 0   Umeclidinium-Vilanterol (ANORO ELLIPTA IN) Inhale into the lungs.     No current facility-administered medications for this visit.    SURGICAL HISTORY:  Past Surgical History:  Procedure Laterality Date   ANTERIOR  CERVICAL DECOMP/DISCECTOMY FUSION N/A 10/27/2018   Procedure: ANTERIOR CERVICAL THREE-FOUR, FOUR-FIVE DECOMPRESSION/DISCECTOMY FUSION TWO LEVELS;  Surgeon: Kary Kos, MD;  Location: [facility];  Service: Neurosurgery;  Laterality: N/A;   ANTERIOR CERVICAL THREE-FOUR, FOUR-FIVE DECOMPRESSION/DISCECTOMY FUSION TWO LEVELS   BACK SURGERY     IR IMAGING GUIDED PORT INSERTION  11/27/2018   MULTIPLE TOOTH EXTRACTIONS     SPINAL CORD STIMULATOR IMPLANT     VIDEO BRONCHOSCOPY N/A 10/29/2018   Procedure: VIDEO BRONCHOSCOPY;  Surgeon: Laurin Coder, MD;  Location: [facility];  Service: Thoracic;  Laterality: N/A;   VIDEO BRONCHOSCOPY WITH ENDOBRONCHIAL ULTRASOUND N/A 10/27/2018   Procedure: VIDEO BRONCHOSCOPY WITH ENDOBRONCHIAL ULTRASOUND;  Surgeon: Laurin Coder, MD;  Location: [facility];  Service: Thoracic;  Laterality: N/A;   VIDEO BRONCHOSCOPY WITH ENDOBRONCHIAL ULTRASOUND N/A 10/29/2018   Procedure: VIDEO BRONCHOSCOPY WITH ENDOBRONCHIAL ULTRASOUND;  Surgeon: Laurin Coder, MD;  Location: [facility];  Service: Thoracic;  Laterality: N/A;    REVIEW OF SYSTEMS:  A comprehensive review of systems was negative.   PHYSICAL EXAMINATION: General appearance: alert, cooperative and no distress Head: Normocephalic, without obvious abnormality, atraumatic Neck: no adenopathy, no JVD, supple, symmetrical, trachea midline and thyroid not enlarged, symmetric, no tenderness/mass/nodules Lymph nodes: Cervical, supraclavicular, and axillary nodes normal. Resp: clear to auscultation bilaterally Back: symmetric, no curvature. ROM normal. No CVA tenderness. Cardio: regular rate and rhythm, S1, S2 normal, no murmur, click, rub or gallop GI: soft, non-tender; bowel sounds normal; no masses,  no organomegaly Extremities: extremities normal, atraumatic, no cyanosis or edema  ECOG PERFORMANCE STATUS: 1 - Symptomatic but completely ambulatory  Blood pressure 111/75, pulse 82, temperature 99.1 F (37.3 C), temperature  source Temporal, resp. rate 18, height 6' 1" (1.854 m), weight (!) 339 lb 11.2 oz (154.1 kg), SpO2 97 %.  LABORATORY DATA: Lab Results  Component Value Date   WBC 7.5 01/21/2020   HGB 13.5 01/21/2020   HCT 42.8 01/21/2020   MCV 94.5 01/21/2020   PLT 251 01/21/2020      Chemistry      Component Value Date/Time   NA 141 01/21/2020 0845   K 4.3 01/21/2020 0845   CL 105 01/21/2020 0845   CO2 29 01/21/2020 0845   BUN 11 01/21/2020 0845   CREATININE 0.82 01/21/2020 0845      Component Value Date/Time   CALCIUM 9.1 01/21/2020 0845   ALKPHOS 124 01/21/2020 0845   AST 15 01/21/2020 0845   ALT 12 01/21/2020 0845   BILITOT 0.7 01/21/2020 0845       RADIOGRAPHIC STUDIES: VAS [place] ABI WITH/WO TBI  Result Date: 01/11/2020 LOWER EXTREMITY DOPPLER STUDY Indications: Claudication, and peripheral artery disease. Patient reports  symmetric bilateral calf claudication. Also reports that his legs              feel weak and they start to hurt almost immediately when standing.              He has also had multiple back surgeries and neuropathy symptoms. High Risk Factors: Hypertension, hyperlipidemia, past history of smoking.  Comparison Study: ABI's performed at [facility] on 10/15/19 were 0.73 on                   the right and 0.75 on the left. Performing Technologist: Mariane Masters RVT  Examination Guidelines: A complete evaluation includes at minimum, Doppler waveform signals and systolic blood pressure reading at the level of bilateral brachial, anterior tibial, and posterior tibial arteries, when vessel segments are accessible. Bilateral testing is considered an integral part of a complete examination. Photoelectric Plethysmograph (PPG) waveforms and toe systolic pressure readings are included as required and additional duplex testing as needed. Limited examinations for reoccurring indications may be performed as noted.  ABI Findings:  +---------+------------------+-----+----------+--------+  Right     Rt Pressure (mmHg) Index Waveform   Comment   +---------+------------------+-----+----------+--------+  Brachial  134                                           +---------+------------------+-----+----------+--------+  ATA       123                0.81  monophasic           +---------+------------------+-----+----------+--------+  PTA       127                0.84  monophasic           +---------+------------------+-----+----------+--------+  PERO      119                0.78  monophasic           +---------+------------------+-----+----------+--------+  Great Toe 98                 0.64  Abnormal             +---------+------------------+-----+----------+--------+ +---------+------------------+-----+-----------+-------+  Left      Lt Pressure (mmHg) Index Waveform    Comment  +---------+------------------+-----+-----------+-------+  Brachial  152                                           +---------+------------------+-----+-----------+-------+  ATA       124                0.82  monophasic           +---------+------------------+-----+-----------+-------+  PTA       140                0.92  monophasic           +---------+------------------+-----+-----------+-------+  PERO      129                0.85  multiphasic          +---------+------------------+-----+-----------+-------+  Great Toe 95                 0.62  Abnormal             +---------+------------------+-----+-----------+-------+ +-------+-----------+-----------+------------+------------+  ABI/TBI Today's ABI Today's TBI Previous ABI Previous TBI  +-------+-----------+-----------+------------+------------+  Right   0.84        0.64        0.73                       +-------+-----------+-----------+------------+------------+  Left    0.92        0.62        0.75                       +-------+-----------+-----------+------------+------------+ Bilateral ABIs appear increased compared to  prior study on 10/15/19.  Summary: Right: Resting right ankle-brachial index indicates mild right lower extremity arterial disease. The right toe-brachial index is abnormal. Left: Resting left ankle-brachial index indicates mild left lower extremity arterial disease. The left toe-brachial index is abnormal.  *See table(s) above for measurements and observations. See arterial duplex report.  Electronically signed by Ida Rogue MD on 01/11/2020 at 6:56:10 PM.    Final    VAS [place] LOWER EXTREMITY ARTERIAL DUPLEX  Result Date: 01/11/2020 LOWER EXTREMITY ARTERIAL DUPLEX STUDY Indications: Claudication, and peripheral artery disease. Patient reports              symmetric bilateral calf claudication. Also reports that his legs              feel weak and they start to hurt almost immediately when standing.              He has also had multiple back surgeries and neuropathy symptoms. High Risk Factors: Hypertension, hyperlipidemia, past history of smoking.  Current ABI: Today's ABIs were 0.84 on the right and 0.92 on the left. Limitations: Aorta-iliac imaging was very limited due to patient morbid obesity,              overlying bowel gas and non-fasting late afternoon study. Comparison Study: On 12/03/19, a bilateral LEA duplex performed at [facility] showed 50-74% stenosis of the right CFA and SFA prox                   and 30-49% stenosis of the left CFA. Monophasic waveforms                   noted. Performing Technologist: Mariane Masters RVT  Examination Guidelines: A complete evaluation includes B-mode imaging, spectral Doppler, color Doppler, and power Doppler as needed of all accessible portions of each vessel. Bilateral testing is considered an integral part of a complete examination. Limited examinations for reoccurring indications may be performed as noted. Aorta: +--------+-------+----------+----------+--------+--------+-----+           AP (cm) Trans (cm) PSV  (cm/s) Waveform Thrombus Shape  +--------+-------+----------+----------+--------+--------+-----+  Proximal         2.60                                           +--------+-------+----------+----------+--------+--------+-----+  Mid      2.60    2.60                                           +--------+-------+----------+----------+--------+--------+-----+  Unable to insonate the aorta due to overlying bowel gas/body habitus. Multiple attempts.  +-----------+--------+-----+---------------+-------------+---------------------+  RIGHT       PSV cm/s Ratio Stenosis        Waveform      Comments               +-----------+--------+-----+---------------+-------------+---------------------+  CIA Distal  283                            biphasic      >50% stenosis          +-----------+--------+-----+---------------+-------------+---------------------+  EIA Prox    173                            biphasic                             +-----------+--------+-----+---------------+-------------+---------------------+  EIA Mid     87                             monophasic                           +-----------+--------+-----+---------------+-------------+---------------------+  EIA Distal  174                            monophasic    <50% stenosis          +-----------+--------+-----+---------------+-------------+---------------------+  CFA Prox    175            30-49% stenosis bi/monophasic plaque                 +-----------+--------+-----+---------------+-------------+---------------------+  DFA         151                            biphasic                             +-----------+--------+-----+---------------+-------------+---------------------+  SFA Prox    261            50-74% stenosis monophasic    diffusely elevated                                                               velocities             +-----------+--------+-----+---------------+-------------+---------------------+  SFA Mid     108                             monophasic    calcific plaque        +-----------+--------+-----+---------------+-------------+---------------------+  SFA Distal  71                             monophasic    calcific plaque        +-----------+--------+-----+---------------+-------------+---------------------+  POP Prox    38  monophasic                           +-----------+--------+-----+---------------+-------------+---------------------+  POP Distal  47                             monophasic                           +-----------+--------+-----+---------------+-------------+---------------------+  TP Trunk    56                             monophasic                           +-----------+--------+-----+---------------+-------------+---------------------+  ATA Distal  40                             monophasic                           +-----------+--------+-----+---------------+-------------+---------------------+  PTA Distal  43                             monophasic                           +-----------+--------+-----+---------------+-------------+---------------------+  PERO Distal                                              not visualized         +-----------+--------+-----+---------------+-------------+---------------------+ Diffusely elevated velocities in the right SFA origin/prox, consistent with 50-74% disease based on PSV and post-stenotic turbulence. Visualized segment of the right common iliac artery demonstrates >50% stenosis.  +-----------+--------+-----+--------+----------+------------------------------+  LEFT        PSV cm/s Ratio Stenosis Waveform   Comments                        +-----------+--------+-----+--------+----------+------------------------------+  CIA Distal  480                     stenotic   suspect high grade >50%                                                         stenosis                        +-----------+--------+-----+--------+----------+------------------------------+   EIA Prox    90                      monophasic                                 +-----------+--------+-----+--------+----------+------------------------------+  EIA Mid     169  monophasic                                 +-----------+--------+-----+--------+----------+------------------------------+  EIA Distal  126                     monophasic                                 +-----------+--------+-----+--------+----------+------------------------------+  CFA Prox    115                     monophasic plaque                          +-----------+--------+-----+--------+----------+------------------------------+  DFA         104                     monophasic                                 +-----------+--------+-----+--------+----------+------------------------------+  SFA Prox    126                     monophasic                                 +-----------+--------+-----+--------+----------+------------------------------+  SFA Mid     149                     monophasic calcific plaque                 +-----------+--------+-----+--------+----------+------------------------------+  SFA Distal  91                      monophasic plaque                          +-----------+--------+-----+--------+----------+------------------------------+  POP Prox    91                      monophasic plaque                          +-----------+--------+-----+--------+----------+------------------------------+  POP Distal  47                      monophasic                                 +-----------+--------+-----+--------+----------+------------------------------+  TP Trunk    71                      monophasic                                 +-----------+--------+-----+--------+----------+------------------------------+  ATA Distal  54                      monophasic                                 +-----------+--------+-----+--------+----------+------------------------------+  PTA Distal  46                       monophasic                                 +-----------+--------+-----+--------+----------+------------------------------+  PERO Distal                                    not visualized                  +-----------+--------+-----+--------+----------+------------------------------+ Visualized segments of the left common iliac artery demonstrate evidence of a high-grade >50% stenosis. Monophasic waveforms in the external iliac artery.  Summary: Technically challenging examination and limited visualization of the aorta/iliac segments. Suggest alternative imaging or dedicated fasting aorta-iliac study if clinically indicated. Atherosclerosis throughout the aorta and bilateral iliac arteries and bilateral lower extremity arteries.  Right: 30-49% stenosis noted in the common femoral artery. >50% stenosis of the right common iliac artery. Diffusely elevated velocities consistent with 50-74% stenosis in the SFA origin/proximal segment based on PSV. Left: High-grade >50% stenosis of the left common iliac artery.  See table(s) above for measurements and observations. See ABI report. Vascular consult recommended. Electronically signed by Ida Rogue MD on 01/11/2020 at 7:44:39 PM.    Final     ASSESSMENT AND PLAN: This is a very pleasant 64 years old white male with a stage IIIa non-small cell lung cancer, squamous cell carcinoma diagnosed in December 2019. The patient completed a course of treatment with concurrent chemoradiation with weekly carboplatin and paclitaxel status post 6 cycles.  He had partial response to this treatment. He was started on consolidation treatment with Imfinzi status post 25 cycles. The patient has been tolerating this treatment well with no concerning adverse effects. I recommended for him to proceed with cycle #26 today as planned. I will see him back for follow-up visit in 2 weeks for evaluation with repeat CT scan of the chest for restaging of his disease. For the peripheral  vascular disease, he is scheduled to see Dr. Alvester Chou in few days. For the chronic back pain he will continue his follow-up visit and evaluation with the [facility]. The patient was advised to call immediately if he has any concerning symptoms in the interval. The patient voices understanding of current disease status and treatment options and is in agreement with the current care plan. All questions were answered. The patient knows to call the clinic with any problems, questions or concerns. We can certainly see the patient much sooner if necessary.  Disclaimer: This note was dictated with voice recognition software. Similar sounding words can inadvertently be transcribed and may not be corrected upon review.

## 2020-01-21 NOTE — Patient Instructions (Signed)
[facility] Discharge Instructions for Patients Receiving Chemotherapy  Today you received the following Immunotherapy agent: Durvalumab (Imfinzi)  To help prevent nausea and vomiting after your treatment, we encourage you to take your nausea medication as directed by your MD.   If you develop nausea and vomiting that is not controlled by your nausea medication, call the clinic.   BELOW ARE SYMPTOMS THAT SHOULD BE REPORTED IMMEDIATELY:  *FEVER GREATER THAN 100.5 F  *CHILLS WITH OR WITHOUT FEVER  NAUSEA AND VOMITING THAT IS NOT CONTROLLED WITH YOUR NAUSEA MEDICATION  *UNUSUAL SHORTNESS OF BREATH  *UNUSUAL BRUISING OR BLEEDING  TENDERNESS IN MOUTH AND THROAT WITH OR WITHOUT PRESENCE OF ULCERS  *URINARY PROBLEMS  *BOWEL PROBLEMS  UNUSUAL RASH Items with * indicate a potential emergency and should be followed up as soon as possible.  Feel free to call the clinic should you have any questions or concerns. The clinic phone number is [phone].  Please show the [facility] at check-in to the Emergency Department and triage nurse.  Coronavirus (COVID-19) Are you at risk?  Are you at risk for the Coronavirus (COVID-19)?  To be considered HIGH RISK for Coronavirus (COVID-19), you have to meet the following criteria:  . Traveled to [place], [place], [place], [place] or [place]; or in the [place] to [place], [place], [employer], or [place]; and have fever, cough, and shortness of breath within the last 2 weeks of travel OR . Been in close contact with a person diagnosed with COVID-19 within the last 2 weeks and have fever, cough, and shortness of breath . IF YOU DO NOT MEET THESE CRITERIA, YOU ARE CONSIDERED LOW RISK FOR COVID-19.  What to do if you are HIGH RISK for COVID-19?  Marland Kitchen If you are having a medical emergency, call 911. . Seek medical care right away. Before you go to a doctor's office, urgent care or emergency department, call ahead and  tell them about your recent travel, contact with someone diagnosed with COVID-19, and your symptoms. You should receive instructions from your physician's office regarding next steps of care.  . When you arrive at healthcare provider, tell the healthcare staff immediately you have returned from visiting [place], [place], [place], [place] or [place]; or traveled in the [place] to [place], [place], [place], or [place]; in the last two weeks or you have been in close contact with a person diagnosed with COVID-19 in the last 2 weeks.   . Tell the health care staff about your symptoms: fever, cough and shortness of breath. . After you have been seen by a medical provider, you will be either: o Tested for (COVID-19) and discharged home on quarantine except to seek medical care if symptoms worsen, and asked to  - Stay home and avoid contact with others until you get your results (4-5 days)  - Avoid travel on public transportation if possible (such as bus, train, or airplane) or o Sent to the Emergency Department by EMS for evaluation, COVID-19 testing, and possible admission depending on your condition and test results.  What to do if you are LOW RISK for COVID-19?  Reduce your risk of any infection by using the same precautions used for avoiding the common cold or flu:  Marland Kitchen Wash your hands often with soap and warm water for at least 20 seconds.  If soap and water are not readily available, use an alcohol-based hand sanitizer with at least 60% alcohol.  Marland Kitchen  If coughing or sneezing, cover your mouth and nose by coughing or sneezing into the elbow areas of your shirt or coat, into a tissue or into your sleeve (not your hands). . Avoid shaking hands with others and consider head nods or verbal greetings only. . Avoid touching your eyes, nose, or mouth with unwashed hands.  . Avoid close contact with people who are sick. . Avoid places or events with large numbers of people in one location, like  concerts or sporting events. . Carefully consider travel plans you have or are making. . If you are planning any travel outside or inside the [place], visit the CDC's Travelers' Health webpage for the latest health notices. . If you have some symptoms but not all symptoms, continue to monitor at home and seek medical attention if your symptoms worsen. . If you are having a medical emergency, call 911.   [facility] / e-Visit: [email or web address]         [facility]: [facility]: [phone]                   [facility]: [phone]

## 2020-01-22 ENCOUNTER — Telehealth: Payer: Self-pay | Admitting: Internal Medicine

## 2020-01-22 NOTE — Telephone Encounter (Signed)
Scheduled per los. Called and spoke with patients wife. Confirmed appt

## 2020-01-28 ENCOUNTER — Telehealth (INDEPENDENT_AMBULATORY_CARE_PROVIDER_SITE_OTHER): Payer: Medicare Other | Admitting: Cardiovascular Disease

## 2020-01-28 ENCOUNTER — Encounter: Payer: Self-pay | Admitting: Cardiovascular Disease

## 2020-01-28 VITALS — BP 136/84 | HR 79 | Temp 98.4°F | Ht 73.0 in | Wt 338.0 lb

## 2020-01-28 DIAGNOSIS — I10 Essential (primary) hypertension: Secondary | ICD-10-CM | POA: Diagnosis not present

## 2020-01-28 DIAGNOSIS — I739 Peripheral vascular disease, unspecified: Secondary | ICD-10-CM | POA: Diagnosis not present

## 2020-01-28 DIAGNOSIS — Z01812 Encounter for preprocedural laboratory examination: Secondary | ICD-10-CM

## 2020-01-28 NOTE — Progress Notes (Signed)
Virtual Visit via Telephone Note   This visit type was conducted due to national recommendations for restrictions regarding the COVID-19 Pandemic (e.g. social distancing) in an effort to limit this patient's exposure and mitigate transmission in our community.  Due to his co-morbid illnesses, this patient is at least at moderate risk for complications without adequate follow up.  This format is felt to be most appropriate for this patient at this time.  The patient did not have access to video technology/had technical difficulties with video requiring transitioning to audio format only (telephone).  All issues noted in this document were discussed and addressed.  No physical exam could be performed with this format.  Please refer to the patient's chart for his  consent to telehealth for [facility].   The patient was identified using 2 identifiers.  Date:  01/28/2020   ID:  Alexander Costain., DOB 13-Dec-1955, MRN [id number]  Patient Location: Home Provider Location: Home  PCP:  Celene Squibb, MD  Cardiologist: Dr. Jenkins Rouge Peripheral vascular cardiologist: Dr. Quay Burow Electrophysiologist:  None   Evaluation Performed:  Follow-Up Visit  Chief Complaint: Peripheral arterial disease  History of Present Illness:    Alexander Troop. is a 64 y.o. severely overweight married Caucasian male father of 1 son referred by Dr. Johnsie Cancel for PAD evaluation.  I last saw him in the office 12/04/2019. He has been disabled since age 16 because of back issues.  He was a Engineer, structural.  He stopped smoking 30 years ago because of the problems include hypertension hyperlipidemia both treated, is never had a heart attack or stroke.  Is no family history for heart disease.  He denies chest pain but has developed shortness of breath since he was diagnosed with squamous cell lung cancer and has undergone radiation therapy.  He does occasionally walk with a cane and has symptomatic leg discomfort when he  walks plus or minus calf claudication.  Previous ABIs have been in the mid 0.7 range.  We obtained Doppler studies on 01/11/2020 revealing a right ankle-brachial index of 0.84 and a left of 0.92.  He did appear to have monophasic waveforms in both common femoral arteries and SFAs with a high-grade left iliac artery stenosis.  He does have lifestyle limiting claudication and is really unable to walk more than 15 or 20 yards without having to stop.  We talked about angiography and endovascular therapy which he wishes to pursue.  The patient does not have symptoms concerning for COVID-19 infection (fever, chills, cough, or new shortness of breath).    Past Medical History:  Diagnosis Date  . Arthritis   . Chronic back pain   . Chronic back pain   . Headache   . HOH (hard of hearing)   . Hypertension   . Lung mass   . Pre-diabetes   . Sleep apnea    wears cpap  . Wears glasses   . Wears partial dentures    Past Surgical History:  Procedure Laterality Date  . ANTERIOR CERVICAL DECOMP/DISCECTOMY FUSION N/A 10/27/2018   Procedure: ANTERIOR CERVICAL THREE-FOUR, FOUR-FIVE DECOMPRESSION/DISCECTOMY FUSION TWO LEVELS;  Surgeon: Kary Kos, MD;  Location: [facility];  Service: Neurosurgery;  Laterality: N/A;   ANTERIOR CERVICAL THREE-FOUR, FOUR-FIVE DECOMPRESSION/DISCECTOMY FUSION TWO LEVELS  . BACK SURGERY    . IR IMAGING GUIDED PORT INSERTION  11/27/2018  . MULTIPLE TOOTH EXTRACTIONS    . SPINAL CORD STIMULATOR IMPLANT    . VIDEO BRONCHOSCOPY N/A 10/29/2018  Procedure: VIDEO BRONCHOSCOPY;  Surgeon: Olalere, Adewale A, MD;  [facility];  Service: Thoracic;  Laterality: N/A;  . VIDEO BRONCHOSCOPY WITH ENDOBRONCHIAL ULTRASOUND N/A 10/27/2018   Procedure: VIDEO BRONCHOSCOPY WITH ENDOBRONCHIAL ULTRASOUND;  Surgeon: Olalere, Adewale A, MD;  Location: [facility];  Service: Thoracic;  Laterality: N/A;  . VIDEO BRONCHOSCOPY WITH ENDOBRONCHIAL ULTRASOUND N/A 10/29/2018   Procedure: VIDEO BRONCHOSCOPY WITH  ENDOBRONCHIAL ULTRASOUND;  Surgeon: Olalere, Adewale A, MD;  [facility];  Service: Thoracic;  Laterality: N/A;     Current Meds  Medication Sig  . albuterol (PROVENTIL) (2.5 MG/3ML) 0.083% nebulizer solution VVN QID PRN  . amLODipine (NORVASC) 10 MG tablet Take 10 mg by mouth daily.   . atorvastatin (LIPITOR) 40 MG tablet Take 40 mg by mouth at bedtime.  . baclofen (LIORESAL) 10 MG tablet Take 10 mg by mouth 3 (three) times daily.  . BREO ELLIPTA 200-25 MCG/INH AEPB Inhale 1 puff into the lungs daily.  . D3-50 1.25 MG (50000 UT) capsule Take 50,000 Units by mouth once a week.  . furosemide (LASIX) 20 MG tablet Take 20 mg by mouth daily.   . gabapentin (NEURONTIN) 300 MG capsule Take 600 mg by mouth 3 (three) times daily. Pt states taking 3Tablets- 300mg tablet TID  . guaifenesin (ROBITUSSIN) 100 MG/5ML syrup Take 200 mg by mouth 3 (three) times daily as needed for cough.  . ipratropium-albuterol (DUONEB) 0.5-2.5 (3) MG/3ML SOLN SMARTSIG:1 Vial(s) Via Nebulizer 4 Times Daily PRN  . lidocaine-prilocaine (EMLA) cream Apply 1 application topically as needed.  . losartan (COZAAR) 100 MG tablet Take 100 mg by mouth daily.   . magic mouthwash w/lidocaine SOLN Take by mouth.  . meloxicam (MOBIC) 15 MG tablet Take 15 mg by mouth daily.   . NARCAN 4 MG/0.1ML LIQD nasal spray kit Place 1 spray into the nose See admin instructions. Administer a single spray in one nostril upon signs of opioid overdose.  Call 911.  Repeat after 3 minutes if no response.  . oxyCODONE (ROXICODONE) 15 MG immediate release tablet Take 1 tablet by mouth daily.  . potassium chloride SA (K-DUR,KLOR-CON) 20 MEQ tablet Take 20 mEq by mouth every evening.   . prochlorperazine (COMPAZINE) 10 MG tablet Take 1 tablet (10 mg total) by mouth every 6 (six) hours as needed for nausea or vomiting.  . Umeclidinium-Vilanterol (ANORO ELLIPTA IN) Inhale into the lungs.     Allergies:   Iohexol   Social History   Tobacco Use  .  Smoking status: Former Smoker    Types: Cigarettes  . Smokeless tobacco: Never Used  . Tobacco comment: Quit smoking cigarettes 15 years ago  Substance Use Topics  . Alcohol use: No  . Drug use: No     Family Hx: The patient's family history includes Lung disease in his father; Lupus in his mother.  ROS:   Please see the history of present illness.     All other systems reviewed and are negative.   Prior CV studies:   The following studies were reviewed today:  Lower extremity arterial Doppler studies performed 01/11/2020  Labs/Other Tests and Data Reviewed:    EKG:  No ECG reviewed.  Recent Labs: 01/07/2020: TSH 1.007 01/21/2020: ALT 12; BUN 11; Creatinine 0.82; Hemoglobin 13.5; Platelet Count 251; Potassium 4.3; Sodium 141   Recent Lipid Panel No results found for: CHOL, TRIG, HDL, CHOLHDL, LDLCALC, LDLDIRECT  Wt Readings from Last 3 Encounters:  01/28/20 (!) 338 lb (153.3 kg)  01/21/20 (!) 339   lb 11.2 oz (154.1 kg)  01/07/20 (!) 339 lb 8 oz (154 kg)     Objective:    Vital Signs:  BP 136/84   Pulse 79   Temp 98.4 F (36.9 C)   Ht 6' 1" (1.854 m)   Wt (!) 338 lb (153.3 kg)   SpO2 91%   BMI 44.59 kg/m    VITAL SIGNS:  reviewed blood pressure obtained by the patient was 136/84 with a pulse of 79.  A complete physical exam was not performed since this was a virtual telemedicine phone visit  ASSESSMENT & PLAN:    1. Peripheral arterial disease-Alexander Hatfield was referred to me by Dr. Nishan for symptomatic PAD.  He does have lifestyle limiting claudication with recent Dopplers performed 01/11/2020 revealing a right ABI of 0.84 and a left of 0.92.  He had monophasic waveforms in his common femoral arteries and what appears to be a high-grade left common iliac artery stenosis.  He wishes to proceed with angiography and potential endovascular therapy.  COVID-19 Education: The signs and symptoms of COVID-19 were discussed with the patient and how to seek care for testing  (follow up with PCP or arrange E-visit).  The importance of social distancing was discussed today.  Time:   Today, I have spent 12 minutes with the patient with telehealth technology discussing the above problems.     Medication Adjustments/Labs and Tests Ordered: Current medicines are reviewed at length with the patient today.  Concerns regarding medicines are outlined above.   Tests Ordered: No orders of the defined types were placed in this encounter.   Medication Changes: No orders of the defined types were placed in this encounter.   Follow Up:  In Person in 1 month(s)  Signed, Cresta Riden, MD  01/28/2020 1:52 PM    [facility]  

## 2020-01-28 NOTE — H&P (View-Only) (Signed)
Virtual Visit via Telephone Note   This visit type was conducted due to national recommendations for restrictions regarding the COVID-19 Pandemic (e.g. social distancing) in an effort to limit this patient's exposure and mitigate transmission in our community.  Due to his co-morbid illnesses, this patient is at least at moderate risk for complications without adequate follow up.  This format is felt to be most appropriate for this patient at this time.  The patient did not have access to video technology/had technical difficulties with video requiring transitioning to audio format only (telephone).  All issues noted in this document were discussed and addressed.  No physical exam could be performed with this format.  Please refer to the patient's chart for his  consent to telehealth for [facility].   The patient was identified using 2 identifiers.  Date:  01/28/2020   ID:  Alexander Costain., DOB [birth year], MRN [id number]  Patient Location: Home Provider Location: Home  PCP:  Celene Squibb, MD  Cardiologist: Dr. Jenkins Rouge Peripheral vascular cardiologist: Dr. Quay Burow Electrophysiologist:  None   Evaluation Performed:  Follow-Up Visit  Chief Complaint: Peripheral arterial disease  History of Present Illness:    Alexander Cumbie. is a 64 y.o. severely overweight married Caucasian male father of 1 son referred by Dr. Johnsie Cancel for PAD evaluation.  I last saw him in the office 12/04/2019. He has been disabled since age 68 because of back issues.  He was a Engineer, structural.  He stopped smoking 30 years ago because of the problems include hypertension hyperlipidemia both treated, is never had a heart attack or stroke.  Is no family history for heart disease.  He denies chest pain but has developed shortness of breath since he was diagnosed with squamous cell lung cancer and has undergone radiation therapy.  He does occasionally walk with a cane and has symptomatic leg discomfort when he  walks plus or minus calf claudication.  Previous ABIs have been in the mid 0.7 range.  We obtained Doppler studies on 01/11/2020 revealing a right ankle-brachial index of 0.84 and a left of 0.92.  He did appear to have monophasic waveforms in both common femoral arteries and SFAs with a high-grade left iliac artery stenosis.  He does have lifestyle limiting claudication and is really unable to walk more than 15 or 20 yards without having to stop.  We talked about angiography and endovascular therapy which he wishes to pursue.  The patient does not have symptoms concerning for COVID-19 infection (fever, chills, cough, or new shortness of breath).    Past Medical History:  Diagnosis Date  . Arthritis   . Chronic back pain   . Chronic back pain   . Headache   . HOH (hard of hearing)   . Hypertension   . Lung mass   . Pre-diabetes   . Sleep apnea    wears cpap  . Wears glasses   . Wears partial dentures    Past Surgical History:  Procedure Laterality Date  . ANTERIOR CERVICAL DECOMP/DISCECTOMY FUSION N/A 10/27/2018   Procedure: ANTERIOR CERVICAL THREE-FOUR, FOUR-FIVE DECOMPRESSION/DISCECTOMY FUSION TWO LEVELS;  Surgeon: Kary Kos, MD;  Location: [facility];  Service: Neurosurgery;  Laterality: N/A;   ANTERIOR CERVICAL THREE-FOUR, FOUR-FIVE DECOMPRESSION/DISCECTOMY FUSION TWO LEVELS  . BACK SURGERY    . IR IMAGING GUIDED PORT INSERTION  11/27/2018  . MULTIPLE TOOTH EXTRACTIONS    . SPINAL CORD STIMULATOR IMPLANT    . VIDEO BRONCHOSCOPY N/A 10/29/2018  Procedure: VIDEO BRONCHOSCOPY;  Surgeon: Olalere, Adewale A, MD;  [facility];  Service: Thoracic;  Laterality: N/A;  . VIDEO BRONCHOSCOPY WITH ENDOBRONCHIAL ULTRASOUND N/A 10/27/2018   Procedure: VIDEO BRONCHOSCOPY WITH ENDOBRONCHIAL ULTRASOUND;  Surgeon: Olalere, Adewale A, MD;  [facility];  Service: Thoracic;  Laterality: N/A;  . VIDEO BRONCHOSCOPY WITH ENDOBRONCHIAL ULTRASOUND N/A 10/29/2018   Procedure: VIDEO BRONCHOSCOPY WITH  ENDOBRONCHIAL ULTRASOUND;  Surgeon: Olalere, Adewale A, MD;  [facility];  Service: Thoracic;  Laterality: N/A;     Current Meds  Medication Sig  . albuterol (PROVENTIL) (2.5 MG/3ML) 0.083% nebulizer solution VVN QID PRN  . amLODipine (NORVASC) 10 MG tablet Take 10 mg by mouth daily.   . atorvastatin (LIPITOR) 40 MG tablet Take 40 mg by mouth at bedtime.  . baclofen (LIORESAL) 10 MG tablet Take 10 mg by mouth 3 (three) times daily.  . BREO ELLIPTA 200-25 MCG/INH AEPB Inhale 1 puff into the lungs daily.  . D3-50 1.25 MG (50000 UT) capsule Take 50,000 Units by mouth once a week.  . furosemide (LASIX) 20 MG tablet Take 20 mg by mouth daily.   . gabapentin (NEURONTIN) 300 MG capsule Take 600 mg by mouth 3 (three) times daily. Pt states taking 3Tablets- 300mg tablet TID  . guaifenesin (ROBITUSSIN) 100 MG/5ML syrup Take 200 mg by mouth 3 (three) times daily as needed for cough.  . ipratropium-albuterol (DUONEB) 0.5-2.5 (3) MG/3ML SOLN SMARTSIG:1 Vial(s) Via Nebulizer 4 Times Daily PRN  . lidocaine-prilocaine (EMLA) cream Apply 1 application topically as needed.  . losartan (COZAAR) 100 MG tablet Take 100 mg by mouth daily.   . magic mouthwash w/lidocaine SOLN Take by mouth.  . meloxicam (MOBIC) 15 MG tablet Take 15 mg by mouth daily.   . NARCAN 4 MG/0.1ML LIQD nasal spray kit Place 1 spray into the nose See admin instructions. Administer a single spray in one nostril upon signs of opioid overdose.  Call 911.  Repeat after 3 minutes if no response.  . oxyCODONE (ROXICODONE) 15 MG immediate release tablet Take 1 tablet by mouth daily.  . potassium chloride SA (K-DUR,KLOR-CON) 20 MEQ tablet Take 20 mEq by mouth every evening.   . prochlorperazine (COMPAZINE) 10 MG tablet Take 1 tablet (10 mg total) by mouth every 6 (six) hours as needed for nausea or vomiting.  . Umeclidinium-Vilanterol (ANORO ELLIPTA IN) Inhale into the lungs.     Allergies:   Iohexol   Social History   Tobacco Use  .  Smoking status: Former Smoker    Types: Cigarettes  . Smokeless tobacco: Never Used  . Tobacco comment: Quit smoking cigarettes 15 years ago  Substance Use Topics  . Alcohol use: No  . Drug use: No     Family Hx: The patient's family history includes Lung disease in his father; Lupus in his mother.  ROS:   Please see the history of present illness.     All other systems reviewed and are negative.   Prior CV studies:   The following studies were reviewed today:  Lower extremity arterial Doppler studies performed 01/11/2020  Labs/Other Tests and Data Reviewed:    EKG:  No ECG reviewed.  Recent Labs: 01/07/2020: TSH 1.007 01/21/2020: ALT 12; BUN 11; Creatinine 0.82; Hemoglobin 13.5; Platelet Count 251; Potassium 4.3; Sodium 141   Recent Lipid Panel No results found for: CHOL, TRIG, HDL, CHOLHDL, LDLCALC, LDLDIRECT  Wt Readings from Last 3 Encounters:  01/28/20 (!) 338 lb (153.3 kg)  01/21/20 (!) 339   lb 11.2 oz (154.1 kg)  01/07/20 (!) 339 lb 8 oz (154 kg)     Objective:    Vital Signs:  BP 136/84   Pulse 79   Temp 98.4 F (36.9 C)   Ht 6' 1" (1.854 m)   Wt (!) 338 lb (153.3 kg)   SpO2 91%   BMI 44.59 kg/m    VITAL SIGNS:  reviewed blood pressure obtained by the patient was 136/84 with a pulse of 79.  A complete physical exam was not performed since this was a virtual telemedicine phone visit  ASSESSMENT & PLAN:    1. Peripheral arterial disease-Mr. Brinson was referred to me by Dr. Nishan for symptomatic PAD.  He does have lifestyle limiting claudication with recent Dopplers performed 01/11/2020 revealing a right ABI of 0.84 and a left of 0.92.  He had monophasic waveforms in his common femoral arteries and what appears to be a high-grade left common iliac artery stenosis.  He wishes to proceed with angiography and potential endovascular therapy.  COVID-19 Education: The signs and symptoms of COVID-19 were discussed with the patient and how to seek care for testing  (follow up with PCP or arrange E-visit).  The importance of social distancing was discussed today.  Time:   Today, I have spent 12 minutes with the patient with telehealth technology discussing the above problems.     Medication Adjustments/Labs and Tests Ordered: Current medicines are reviewed at length with the patient today.  Concerns regarding medicines are outlined above.   Tests Ordered: No orders of the defined types were placed in this encounter.   Medication Changes: No orders of the defined types were placed in this encounter.   Follow Up:  In Person in 1 month(s)  Signed,  , MD  01/28/2020 1:52 PM    [facility]  

## 2020-01-28 NOTE — Patient Instructions (Addendum)
Medication Instructions:  Continue current medication  *If you need a refill on your cardiac medications before your next appointment, please call your pharmacy*   Lab Work: [facility] and [facility] Monday April 5th Covid19 Test at [facility] Monday April 5th @ 3:20 pm  If you have labs (blood work) drawn today and your tests are completely normal, you will receive your results only by: Marland Kitchen MyChart Message (if you have MyChart) OR . A paper copy in the mail If you have any lab test that is abnormal or we need to change your treatment, we will call you to review the results.   Testing/Procedures: Your physician has requested that you have a peripheral vascular angiogram. This exam is performed at the hospital. During this exam IV contrast is used to look at arterial blood flow. Please review the information sheet given for details.   Follow-Up: At [facility], you and your health needs are our priority.  As part of our continuing mission to provide you with exceptional heart care, we have created designated Provider Care Teams.  These Care Teams include your primary Cardiologist (physician) and Advanced Practice Providers (APPs -  Physician Assistants and Nurse Practitioners) who all work together to provide you with the care you need, when you need it.  We recommend signing up for the patient portal called "MyChart".  Sign up information is provided on this After Visit Summary.  MyChart is used to connect with patients for Virtual Visits (Telemedicine).  Patients are able to view lab/test results, encounter notes, upcoming appointments, etc.  Non-urgent messages can be sent to your provider as well.   To learn more about what you can do with MyChart, go to [email or web address].    Your next appointment:   3 month(s)  The format for your next appointment:   In Person  Provider:   Quay Burow, MD   Other Instructions    [place] [address] Dept: [phone] Loc: [place].  01/28/2020  You are scheduled for a Peripheral Angiogram on Thursday, April 8 with Dr. Quay Burow.  1. Please arrive at the [facility] (Main Entrance A) at [facility]: [address] [place], [place] [address] at 5:30 AM (This time is two hours before your procedure to ensure your preparation). Free valet parking service is available.   Special note: Every effort is made to have your procedure done on time. Please understand that emergencies sometimes delay scheduled procedures.  2. Diet: Do not eat solid foods after midnight.  The patient may have clear liquids until 5am upon the day of the procedure.  3. Labs: You will need to have blood drawn on Monday, April 5 at [place]. [address],   Open: 7am - 6pm, Sat 8am - 12 noon   Phone: [phone]. You do not need to be fasting.  4. Medication instructions in preparation for your procedure:   Contrast Allergy: No   Stop taking, Cozaar (Losartan) Thursday, April 8,, Lasix (Furosemide)  Thursday, April 8,   On the morning of your procedure, take your morning medicines NOT listed above.  You may use sips of water.  5. Plan for one night stay--bring personal belongings. 6. Bring a current list of your medications and current insurance cards. 7. You MUST have a responsible person to drive you home. 8. Someone MUST be with you the  first 24 hours after you arrive home or your discharge will be delayed. 9. Please wear clothes that are easy to get on and off and wear slip-on shoes.  Thank you for allowing [place] to care for you!   -- [facility]

## 2020-01-28 NOTE — Addendum Note (Signed)
Addended by: Vennie Homans on: 01/28/2020 02:21 PM   Modules accepted: Orders

## 2020-01-29 ENCOUNTER — Ambulatory Visit: Payer: Medicare Other | Admitting: Cardiovascular Disease

## 2020-02-01 ENCOUNTER — Ambulatory Visit (HOSPITAL_COMMUNITY)
Admission: RE | Admit: 2020-02-01 | Discharge: 2020-02-01 | Disposition: A | Discharge status: Home / Self Care | Payer: Medicare Other | Source: Ambulatory Visit | Attending: Internal Medicine | Admitting: Internal Medicine

## 2020-02-01 ENCOUNTER — Other Ambulatory Visit: Payer: Self-pay

## 2020-02-01 DIAGNOSIS — C3490 Malignant neoplasm of unspecified part of unspecified bronchus or lung: Secondary | ICD-10-CM | POA: Insufficient documentation

## 2020-02-02 ENCOUNTER — Other Ambulatory Visit: Payer: Self-pay | Admitting: *Deleted

## 2020-02-02 ENCOUNTER — Encounter: Payer: Self-pay | Admitting: *Deleted

## 2020-02-02 DIAGNOSIS — I739 Peripheral vascular disease, unspecified: Secondary | ICD-10-CM

## 2020-02-02 MED ORDER — SODIUM CHLORIDE 0.9% FLUSH: 3.0000 mL | Freq: Two times a day (BID) | INTRAVENOUS | Status: DC

## 2020-02-02 MED ORDER — PREDNISONE 50 MG PO TABS: ORAL_TABLET | ORAL | 0 refills | Status: DC

## 2020-02-04 ENCOUNTER — Encounter: Payer: Self-pay | Admitting: Internal Medicine

## 2020-02-04 ENCOUNTER — Inpatient Hospital Stay: Payer: Medicare Other

## 2020-02-04 ENCOUNTER — Other Ambulatory Visit: Payer: Self-pay

## 2020-02-04 ENCOUNTER — Inpatient Hospital Stay (HOSPITAL_BASED_OUTPATIENT_CLINIC_OR_DEPARTMENT_OTHER): Payer: Medicare Other | Admitting: Internal Medicine

## 2020-02-04 VITALS — BP 126/83 | HR 78 | Temp 98.9°F | Resp 19 | Ht 73.0 in | Wt 343.0 lb

## 2020-02-04 DIAGNOSIS — C3490 Malignant neoplasm of unspecified part of unspecified bronchus or lung: Secondary | ICD-10-CM | POA: Diagnosis not present

## 2020-02-04 DIAGNOSIS — Z5112 Encounter for antineoplastic immunotherapy: Secondary | ICD-10-CM | POA: Diagnosis not present

## 2020-02-04 DIAGNOSIS — I739 Peripheral vascular disease, unspecified: Secondary | ICD-10-CM

## 2020-02-04 DIAGNOSIS — I10 Essential (primary) hypertension: Secondary | ICD-10-CM

## 2020-02-04 DIAGNOSIS — C3491 Malignant neoplasm of unspecified part of right bronchus or lung: Secondary | ICD-10-CM | POA: Diagnosis not present

## 2020-02-04 LAB — CBC WITH DIFFERENTIAL (CANCER CENTER ONLY)
Abs Immature Granulocytes: 0.03 10*3/uL (ref 0.00–0.07)
Basophils Absolute: 0 10*3/uL (ref 0.0–0.1)
Basophils Relative: 0 %
Eosinophils Absolute: 0.4 10*3/uL (ref 0.0–0.5)
Eosinophils Relative: 6 %
HCT: 44.7 % (ref 39.0–52.0)
Hemoglobin: 14.1 g/dL (ref 13.0–17.0)
Immature Granulocytes: 0 %
Lymphocytes Relative: 20 %
Lymphs Abs: 1.4 10*3/uL (ref 0.7–4.0)
MCH: 29.3 pg (ref 26.0–34.0)
MCHC: 31.5 g/dL (ref 30.0–36.0)
MCV: 92.7 fL (ref 80.0–100.0)
Monocytes Absolute: 0.6 10*3/uL (ref 0.1–1.0)
Monocytes Relative: 9 %
Neutro Abs: 4.6 10*3/uL (ref 1.7–7.7)
Neutrophils Relative %: 65 %
Platelet Count: 256 10*3/uL (ref 150–400)
RBC: 4.82 MIL/uL (ref 4.22–5.81)
RDW: 13.2 % (ref 11.5–15.5)
WBC Count: 7.2 10*3/uL (ref 4.0–10.5)
nRBC: 0 % (ref 0.0–0.2)

## 2020-02-04 LAB — CMP (CANCER CENTER ONLY)
ALT: 14 U/L (ref 0–44)
AST: 13 U/L — ABNORMAL LOW (ref 15–41)
Albumin: 3.6 g/dL (ref 3.5–5.0)
Alkaline Phosphatase: 129 U/L — ABNORMAL HIGH (ref 38–126)
Anion gap: 9 (ref 5–15)
BUN: 11 mg/dL (ref 8–23)
CO2: 29 mmol/L (ref 22–32)
Calcium: 9.3 mg/dL (ref 8.9–10.3)
Chloride: 104 mmol/L (ref 98–111)
Creatinine: 0.86 mg/dL (ref 0.61–1.24)
GFR, Est AFR Am: >60 mL/min (ref >60)
GFR, Estimated: >60 mL/min (ref >60)
Glucose, Bld: 123 mg/dL — ABNORMAL HIGH (ref 70–99)
Potassium: 4.2 mmol/L (ref 3.5–5.1)
Sodium: 142 mmol/L (ref 135–145)
Total Bilirubin: 0.5 mg/dL (ref 0.3–1.2)
Total Protein: 7 g/dL (ref 6.5–8.1)

## 2020-02-04 NOTE — Progress Notes (Signed)
[facility] [phone]) [phone]   [phone]) [phone]  OFFICE PROGRESS NOTE  Celene Squibb, MD [address] [place] [address]  DIAGNOSIS: stage IIIA (T2b, N2, M0) non-small cell lung cancer, squamous cell carcinoma presented with right upper lobe lung mass in addition to right paratracheal lymphadenopathy diagnosed in December 2019. The patient also has a hypermetabolic lesion in the left parotid gland that need further evaluation.  PRIOR THERAPY:   1) concurrent chemoradiation with weekly carboplatin for AUC of 2 and paclitaxel 45 mg/M2.  First dose started on 12/02/2018. Status post 6 cycles.  Last cycle was given on January 05, 2019 the patient is receiving radiation closer to home in [place], [place] completed January 12, 2019. 2) Consolidation treatment with immunotherapy with Imfinzi 10 mg/KG IV every 2 weeks.  First dose was given February 05, 2019. Status post 26 cycles.  CURRENT THERAPY:  Observation.  INTERVAL HISTORY: Alexander Hatfield. 64 y.o. male returns to the clinic today for follow-up visit.  The patient is feeling fine today with no concerning complaints.  He is scheduled to have vascular surgery in the next few weeks because of the peripheral vascular disease.  The patient denied having any current chest pain, shortness of breath, cough or hemoptysis.  He denied having any fever or chills.  He has no nausea, vomiting, diarrhea or constipation.  He denied having any recent weight loss or night sweats.  He tolerated the previous course of consolidation treatment with immunotherapy fairly well.  He had repeat CT scan of the chest performed recently and he is here for evaluation and discussion of his discuss results.  MEDICAL HISTORY: Past Medical History:  Diagnosis Date  . Arthritis   . Chronic back pain   . Chronic back pain   . Headache   . HOH (hard of hearing)   . Hypertension   . Lung mass   . Pre-diabetes   . Sleep apnea    wears  cpap  . Wears glasses   . Wears partial dentures     ALLERGIES:  is allergic to iohexol.  MEDICATIONS:  Current Outpatient Medications  Medication Sig Dispense Refill  . albuterol (PROVENTIL) (2.5 MG/3ML) 0.083% nebulizer solution VVN QID PRN    . amLODipine (NORVASC) 10 MG tablet Take 10 mg by mouth daily.     Marland Kitchen atorvastatin (LIPITOR) 40 MG tablet Take 40 mg by mouth at bedtime.    . baclofen (LIORESAL) 10 MG tablet Take 10 mg by mouth 3 (three) times daily.    Marland Kitchen BREO ELLIPTA 200-25 MCG/INH AEPB Inhale 1 puff into the lungs daily.    . D3-50 1.25 MG (50000 UT) capsule Take 50,000 Units by mouth once a week.    . furosemide (LASIX) 20 MG tablet Take 20 mg by mouth daily.     Marland Kitchen gabapentin (NEURONTIN) 300 MG capsule Take 600 mg by mouth 3 (three) times daily. Pt states taking 3Tablets- 319m tablet TID    . guaifenesin (ROBITUSSIN) 100 MG/5ML syrup Take 200 mg by mouth 3 (three) times daily as needed for cough.    .Marland Kitchenipratropium-albuterol (DUONEB) 0.5-2.5 (3) MG/3ML SOLN SMARTSIG:1 Vial(s) Via Nebulizer 4 Times Daily PRN    . lidocaine-prilocaine (EMLA) cream Apply 1 application topically as needed. 30 g 0  . losartan (COZAAR) 100 MG tablet Take 100 mg by mouth daily.     . meloxicam (MOBIC) 15 MG tablet Take 15 mg by mouth  daily.     . oxyCODONE (ROXICODONE) 15 MG immediate release tablet Take 1 tablet by mouth daily.    . potassium chloride SA (K-DUR,KLOR-CON) 20 MEQ tablet Take 20 mEq by mouth every evening.     Marland Kitchen Umeclidinium-Vilanterol (ANORO ELLIPTA IN) Inhale into the lungs.    . magic mouthwash w/lidocaine SOLN Take by mouth.    [name] 4 MG/0.1ML LIQD nasal spray kit Place 1 spray into the nose See admin instructions. Administer a single spray in one nostril upon signs of opioid overdose.  Call 911.  Repeat after 3 minutes if no response.    . predniSONE (DELTASONE) 50 MG tablet TAKE 1 TABLET 13 HOURS PRIOR TO PROCEDURE, TAKE 1 TABLET 7 HOURS PRIOR TO PROCEDURE AND 1 TABLET PRIOR  TO LEAVING FOR THE PROCEDURE (Patient not taking: Reported on 02/04/2020) 3 tablet 0  . prochlorperazine (COMPAZINE) 10 MG tablet Take 1 tablet (10 mg total) by mouth every 6 (six) hours as needed for nausea or vomiting. (Patient not taking: Reported on 02/04/2020) 30 tablet 0   Current Facility-Administered Medications  Medication Dose Route Frequency Provider Last Rate Last Admin  . sodium chloride flush (NS) 0.9 % injection 3 mL  3 mL Intravenous Q12H Lorretta Harp, MD        SURGICAL HISTORY:  Past Surgical History:  Procedure Laterality Date  . ANTERIOR CERVICAL DECOMP/DISCECTOMY FUSION N/A 10/27/2018   Procedure: ANTERIOR CERVICAL THREE-FOUR, FOUR-FIVE DECOMPRESSION/DISCECTOMY FUSION TWO LEVELS;  Surgeon: Kary Kos, MD;  Location: [facility];  Service: Neurosurgery;  Laterality: N/A;   ANTERIOR CERVICAL THREE-FOUR, FOUR-FIVE DECOMPRESSION/DISCECTOMY FUSION TWO LEVELS  . BACK SURGERY    . IR IMAGING GUIDED PORT INSERTION  11/27/2018  . MULTIPLE TOOTH EXTRACTIONS    . SPINAL CORD STIMULATOR IMPLANT    . VIDEO BRONCHOSCOPY N/A 10/29/2018   Procedure: VIDEO BRONCHOSCOPY;  Surgeon: Laurin Coder, MD;  Location: [facility];  Service: Thoracic;  Laterality: N/A;  . VIDEO BRONCHOSCOPY WITH ENDOBRONCHIAL ULTRASOUND N/A 10/27/2018   Procedure: VIDEO BRONCHOSCOPY WITH ENDOBRONCHIAL ULTRASOUND;  Surgeon: Laurin Coder, MD;  Location: [facility];  Service: Thoracic;  Laterality: N/A;  . VIDEO BRONCHOSCOPY WITH ENDOBRONCHIAL ULTRASOUND N/A 10/29/2018   Procedure: VIDEO BRONCHOSCOPY WITH ENDOBRONCHIAL ULTRASOUND;  Surgeon: Laurin Coder, MD;  Location: [facility];  Service: Thoracic;  Laterality: N/A;    REVIEW OF SYSTEMS:  Constitutional: negative Eyes: negative Ears, nose, mouth, throat, and face: negative Respiratory: negative Cardiovascular: negative Gastrointestinal: negative Genitourinary:negative Integument/breast: negative Hematologic/lymphatic:  negative Musculoskeletal:negative Neurological: negative Behavioral/Psych: negative Endocrine: negative Allergic/Immunologic: negative   PHYSICAL EXAMINATION: General appearance: alert, cooperative and no distress Head: Normocephalic, without obvious abnormality, atraumatic Neck: no adenopathy, no JVD, supple, symmetrical, trachea midline and thyroid not enlarged, symmetric, no tenderness/mass/nodules Lymph nodes: Cervical, supraclavicular, and axillary nodes normal. Resp: clear to auscultation bilaterally Back: symmetric, no curvature. ROM normal. No CVA tenderness. Cardio: regular rate and rhythm, S1, S2 normal, no murmur, click, rub or gallop GI: soft, non-tender; bowel sounds normal; no masses,  no organomegaly Extremities: extremities normal, atraumatic, no cyanosis or edema Neurologic: Alert and oriented X 3, normal strength and tone. Normal symmetric reflexes. Normal coordination and gait  ECOG PERFORMANCE STATUS: 1 - Symptomatic but completely ambulatory  Blood pressure 126/83, pulse 78, temperature 98.9 F (37.2 C), temperature source Oral, resp. rate 19, height _0  (1.854 m), weight (!) 343 lb (155.6 kg), SpO2 91 %.  LABORATORY DATA: Lab Results  Component Value Date   WBC 7.2 02/04/2020  HGB 14.1 02/04/2020   HCT 44.7 02/04/2020   MCV 92.7 02/04/2020   PLT 256 02/04/2020      Chemistry      Component Value Date/Time   NA 142 02/04/2020 1121   K 4.2 02/04/2020 1121   CL 104 02/04/2020 1121   CO2 29 02/04/2020 1121   BUN 11 02/04/2020 1121   CREATININE 0.86 02/04/2020 1121      Component Value Date/Time   CALCIUM 9.3 02/04/2020 1121   ALKPHOS 129 (H) 02/04/2020 1121   AST 13 (L) 02/04/2020 1121   ALT 14 02/04/2020 1121   BILITOT 0.5 02/04/2020 1121       RADIOGRAPHIC STUDIES: CT Chest Wo Contrast  Result Date: 02/01/2020 CLINICAL DATA:  Non-small cell lung cancer. Restaging. EXAM: CT CHEST WITHOUT CONTRAST TECHNIQUE: Multidetector CT imaging of the  chest was performed following the standard protocol without IV contrast. COMPARISON:  10/12/2019 FINDINGS: Cardiovascular: The heart size is normal. Aortic atherosclerosis noted. Three vessel coronary artery atherosclerotic calcifications. Mediastinum/Nodes: Large right lobe of thyroid gland nodule measures approximately 2.8 cm. The trachea appears patent and is midline. Normal appearance of the esophagus. Index right paratracheal lymph node measures 1.2 cm, image 55/2. Stable from previous exam. Index subcarinal lymph node measures 1.3 cm, image 75/2. Unchanged from previous exam. Lungs/Pleura: No pleural effusion. Mild centrilobular and paraseptal emphysema. Fibrosis and masslike architectural distortion within the paramediastinal right lung is again identified and appears similar to the previous exam. The masslike architectural distortion calcified granuloma identified within the anterolateral right upper lobe. Unchanged 2 mm posterolateral left upper lobe lung nodule. Upper Abdomen: No acute abnormality. Musculoskeletal: A dorsal column stimulator is noted within the thoracic canal. No acute or suspicious osseous lesions. IMPRESSION: 1. Stable CT of the chest. Post treatment changes are identified within the paramediastinal right lung. No specific findings identified to suggest residual or recurrence of tumor. 2. Stable borderline enlarged mediastinal lymph nodes. 3. Three vessel coronary artery atherosclerotic calcifications. 4. Large right lobe of thyroid gland nodule. Recommend thyroid US (ref: J Am Coll Radiol. 2015 Feb;12(2): 143-50). Aortic Atherosclerosis (ICD10-I70.0) and Emphysema (ICD10-J43.9). Electronically Signed   By: Kerby Moors M.D.   On: 02/01/2020 19:13   VAS [place] ABI WITH/WO TBI  Result Date: 01/11/2020 LOWER EXTREMITY DOPPLER STUDY Indications: Claudication, and peripheral artery disease. Patient reports              symmetric bilateral calf claudication. Also reports that his legs               feel weak and they start to hurt almost immediately when standing.              He has also had multiple back surgeries and neuropathy symptoms. High Risk Factors: Hypertension, hyperlipidemia, past history of smoking.  Comparison Study: ABI's performed at [facility] on 10/15/19 were 0.73 on                   the right and 0.75 on the left. Performing Technologist: Mariane Masters RVT  Examination Guidelines: A complete evaluation includes at minimum, Doppler waveform signals and systolic blood pressure reading at the level of bilateral brachial, anterior tibial, and posterior tibial arteries, when vessel segments are accessible. Bilateral testing is considered an integral part of a complete examination. Photoelectric Plethysmograph (PPG) waveforms and toe systolic pressure readings are included as required and additional duplex testing as needed. Limited examinations for reoccurring indications may be performed as noted.  ABI  Findings: +---------+------------------+-----+----------+--------+ Right    Rt Pressure (mmHg)IndexWaveform  Comment  +---------+------------------+-----+----------+--------+ Brachial 134                                       +---------+------------------+-----+----------+--------+ ATA      123               0.81 monophasic         +---------+------------------+-----+----------+--------+ PTA      127               0.84 monophasic         +---------+------------------+-----+----------+--------+ PERO     119               0.78 monophasic         +---------+------------------+-----+----------+--------+ Great Toe98                0.64 Abnormal           +---------+------------------+-----+----------+--------+ +---------+------------------+-----+-----------+-------+ Left     Lt Pressure (mmHg)IndexWaveform   Comment +---------+------------------+-----+-----------+-------+ Brachial 152                                        +---------+------------------+-----+-----------+-------+ ATA      124               0.82 monophasic         +---------+------------------+-----+-----------+-------+ PTA      140               0.92 monophasic         +---------+------------------+-----+-----------+-------+ PERO     129               0.85 multiphasic        +---------+------------------+-----+-----------+-------+ Great Toe95                0.62 Abnormal           +---------+------------------+-----+-----------+-------+ +-------+-----------+-----------+------------+------------+ ABI/TBIToday's ABIToday's TBIPrevious ABIPrevious TBI +-------+-----------+-----------+------------+------------+ Right  0.84       0.64       0.73                     +-------+-----------+-----------+------------+------------+ Left   0.92       0.62       0.75                     +-------+-----------+-----------+------------+------------+ Bilateral ABIs appear increased compared to prior study on 10/15/19.  Summary: Right: Resting right ankle-brachial index indicates mild right lower extremity arterial disease. The right toe-brachial index is abnormal. Left: Resting left ankle-brachial index indicates mild left lower extremity arterial disease. The left toe-brachial index is abnormal.  *See table(s) above for measurements and observations. See arterial duplex report.  Electronically signed by Ida Rogue MD on 01/11/2020 at 6:56:10 PM.    Final    VAS [place] LOWER EXTREMITY ARTERIAL DUPLEX  Result Date: 01/11/2020 LOWER EXTREMITY ARTERIAL DUPLEX STUDY Indications: Claudication, and peripheral artery disease. Patient reports              symmetric bilateral calf claudication. Also reports that his legs              feel weak and they start to hurt almost immediately when standing.  He has also had multiple back surgeries and neuropathy symptoms. High Risk Factors: Hypertension, hyperlipidemia, past history of smoking.   Current ABI: Today's ABIs were 0.84 on the right and 0.92 on the left. Limitations: Aorta-iliac imaging was very limited due to patient morbid obesity,              overlying bowel gas and non-fasting late afternoon study. Comparison Study: On 12/03/19, a bilateral LEA duplex performed at [facility] showed 50-74% stenosis of the right CFA and SFA prox                   and 30-49% stenosis of the left CFA. Monophasic waveforms                   noted. Performing Technologist: Mariane Masters RVT  Examination Guidelines: A complete evaluation includes B-mode imaging, spectral Doppler, color Doppler, and power Doppler as needed of all accessible portions of each vessel. Bilateral testing is considered an integral part of a complete examination. Limited examinations for reoccurring indications may be performed as noted. Aorta: +--------+-------+----------+----------+--------+--------+-----+         AP (cm)Trans (cm)PSV (cm/s)WaveformThrombusShape +--------+-------+----------+----------+--------+--------+-----+ Proximal       2.60                                      +--------+-------+----------+----------+--------+--------+-----+ Mid     2.60   2.60                                      +--------+-------+----------+----------+--------+--------+-----+ Unable to insonate the aorta due to overlying bowel gas/body habitus. Multiple attempts.  +-----------+--------+-----+---------------+-------------+---------------------+ RIGHT      PSV cm/sRatioStenosis       Waveform     Comments              +-----------+--------+-----+---------------+-------------+---------------------+ CIA Distal 283                         biphasic     >50% stenosis         +-----------+--------+-----+---------------+-------------+---------------------+ EIA Prox   173                         biphasic                            +-----------+--------+-----+---------------+-------------+---------------------+ EIA Mid    87                          monophasic                         +-----------+--------+-----+---------------+-------------+---------------------+ EIA Distal 174                         monophasic   <50% stenosis         +-----------+--------+-----+---------------+-------------+---------------------+ CFA Prox   175          30-49% stenosisbi/monophasicplaque                +-----------+--------+-----+---------------+-------------+---------------------+ DFA  151                         biphasic                           +-----------+--------+-----+---------------+-------------+---------------------+ SFA Prox   261          50-74% stenosismonophasic   diffusely elevated                                                        velocities            +-----------+--------+-----+---------------+-------------+---------------------+ SFA Mid    108                         monophasic   calcific plaque       +-----------+--------+-----+---------------+-------------+---------------------+ SFA Distal 71                          monophasic   calcific plaque       +-----------+--------+-----+---------------+-------------+---------------------+ POP Prox   38                          monophasic                         +-----------+--------+-----+---------------+-------------+---------------------+ POP Distal 47                          monophasic                         +-----------+--------+-----+---------------+-------------+---------------------+ TP Trunk   56                          monophasic                         +-----------+--------+-----+---------------+-------------+---------------------+ ATA Distal 40                          monophasic                         +-----------+--------+-----+---------------+-------------+---------------------+ PTA  Distal 43                          monophasic                         +-----------+--------+-----+---------------+-------------+---------------------+ PERO Distal                                         not visualized        +-----------+--------+-----+---------------+-------------+---------------------+ Diffusely elevated velocities in the right SFA origin/prox, consistent with 50-74% disease based on PSV and post-stenotic turbulence. Visualized segment of the right common iliac artery demonstrates >50% stenosis.  +-----------+--------+-----+--------+----------+------------------------------+ LEFT       PSV cm/sRatioStenosisWaveform  Comments                       +-----------+--------+-----+--------+----------+------------------------------+  CIA Distal 480                  stenotic  suspect high grade >50%                                                  stenosis                       +-----------+--------+-----+--------+----------+------------------------------+ EIA Prox   90                   monophasic                               +-----------+--------+-----+--------+----------+------------------------------+ EIA Mid    169                  monophasic                               +-----------+--------+-----+--------+----------+------------------------------+ EIA Distal 126                  monophasic                               +-----------+--------+-----+--------+----------+------------------------------+ CFA Prox   115                  monophasicplaque                         +-----------+--------+-----+--------+----------+------------------------------+ DFA        104                  monophasic                               +-----------+--------+-----+--------+----------+------------------------------+ SFA Prox   126                  monophasic                                +-----------+--------+-----+--------+----------+------------------------------+ SFA Mid    149                  monophasiccalcific plaque                +-----------+--------+-----+--------+----------+------------------------------+ SFA Distal 91                   monophasicplaque                         +-----------+--------+-----+--------+----------+------------------------------+ POP Prox   91                   monophasicplaque                         +-----------+--------+-----+--------+----------+------------------------------+ POP Distal 47                   monophasic                               +-----------+--------+-----+--------+----------+------------------------------+  TP Trunk   71                   monophasic                               +-----------+--------+-----+--------+----------+------------------------------+ ATA Distal 54                   monophasic                               +-----------+--------+-----+--------+----------+------------------------------+ PTA Distal 46                   monophasic                               +-----------+--------+-----+--------+----------+------------------------------+ PERO Distal                               not visualized                 +-----------+--------+-----+--------+----------+------------------------------+ Visualized segments of the left common iliac artery demonstrate evidence of a high-grade >50% stenosis. Monophasic waveforms in the external iliac artery.  Summary: Technically challenging examination and limited visualization of the aorta/iliac segments. Suggest alternative imaging or dedicated fasting aorta-iliac study if clinically indicated. Atherosclerosis throughout the aorta and bilateral iliac arteries and bilateral lower extremity arteries.  Right: 30-49% stenosis noted in the common femoral artery. >50% stenosis of the right common iliac artery. Diffusely elevated  velocities consistent with 50-74% stenosis in the SFA origin/proximal segment based on PSV. Left: High-grade >50% stenosis of the left common iliac artery.  See table(s) above for measurements and observations. See ABI report. Vascular consult recommended. Electronically signed by Ida Rogue MD on 01/11/2020 at 7:44:39 PM.    Final     ASSESSMENT AND PLAN: This is a very pleasant 64 years old white male with a stage IIIa non-small cell lung cancer, squamous cell carcinoma diagnosed in December 2019. The patient completed a course of treatment with concurrent chemoradiation with weekly carboplatin and paclitaxel status post 6 cycles.  He had partial response to this treatment. He was started on consolidation treatment with Imfinzi status post 26 cycles. The patient tolerated the previous course of his treatment well with no concerning adverse effects. He had repeat CT scan of the chest performed recently.  I personally and independently reviewed the scans and discussed the results with the patient today. His scan showed no concerning findings for disease progression or metastasis. I recommended for him to continue on observation with repeat CT scan of the chest in 3 months. For the peripheral arterial disease, he is followed by cardiology and expected to have a surgical procedure in the next few weeks. For the chronic back pain he is currently under treatment with the [facility] pain clinic.  The patient voices understanding of current disease status and treatment options and is in agreement with the current care plan. All questions were answered. The patient knows to call the clinic with any problems, questions or concerns. We can certainly see the patient much sooner if necessary.  Disclaimer: This note was dictated with voice recognition software. Similar sounding words can inadvertently be transcribed and may not be corrected upon review.

## 2020-02-05 ENCOUNTER — Telehealth: Payer: Self-pay | Admitting: Internal Medicine

## 2020-02-05 NOTE — Telephone Encounter (Signed)
Scheduled per los. Called and spoke with patient. Confirmed appt 

## 2020-02-10 ENCOUNTER — Other Ambulatory Visit: Payer: Self-pay | Admitting: Cardiovascular Disease

## 2020-02-10 DIAGNOSIS — I739 Peripheral vascular disease, unspecified: Secondary | ICD-10-CM

## 2020-02-12 ENCOUNTER — Telehealth: Payer: Self-pay | Admitting: Cardiovascular Disease

## 2020-02-12 NOTE — Telephone Encounter (Signed)
Contacted wife, advised of letter and answered all questions.  Patient wife verbalized understanding.

## 2020-02-12 NOTE — Telephone Encounter (Signed)
New message   Patient's wife has question about the letter that was received about his procedure. Please advise.

## 2020-02-15 ENCOUNTER — Other Ambulatory Visit (HOSPITAL_COMMUNITY)
Admission: RE | Admit: 2020-02-15 | Discharge: 2020-02-15 | Disposition: A | Discharge status: Home / Self Care | Payer: Medicare Other | Source: Ambulatory Visit | Attending: Cardiovascular Disease | Admitting: Cardiovascular Disease

## 2020-02-15 ENCOUNTER — Other Ambulatory Visit: Payer: Self-pay

## 2020-02-15 DIAGNOSIS — Z01812 Encounter for preprocedural laboratory examination: Secondary | ICD-10-CM | POA: Insufficient documentation

## 2020-02-15 DIAGNOSIS — Z20822 Contact with and (suspected) exposure to covid-19: Secondary | ICD-10-CM | POA: Insufficient documentation

## 2020-02-15 LAB — SARS CORONAVIRUS 2 (TAT 6-24 HRS): SARS Coronavirus 2: NEGATIVE

## 2020-02-16 ENCOUNTER — Telehealth: Payer: Self-pay | Admitting: *Deleted

## 2020-02-16 NOTE — Telephone Encounter (Addendum)
Pt contacted pre-abdominal aortogram scheduled at [facility] for: Thursday February 18, 2020 7:30 AM Verified arrival time and place: [facility]) at: 5:30 AM   No solid food after midnight prior to cath, clear liquids until 5 AM day of procedure.  Contrast allergy: yes- 13 hour Prednisone and Benadryl Prep reviewed with patient's wife. Prednisone 50 mg 02/17/20 6:30 PM Prednisone 50 mg 02/18/20 12:30 AM Prednisone 50 mg and Benadryl 50 mg 02/18/20-AM of procedure just prior to leaving home for hospital Advised pt should not drive to hospital  Hold: Lasix/KCl- AM of procedure  Except hold medications AM meds can be  taken pre-cath with sip of water including: ASA 81 mg Prednisone 50 mg Benadryl 50 mg  Confirmed patient has responsible adult to drive home post procedure and observe 24 hours after arriving home: yes  Currently, due to Covid-19 pandemic, only one person will be allowed with patient. Must be the same person for patient's entire stay and will be required to wear a mask. They will be asked to wait in the waiting room for the duration of the patient's stay.  Patients are required to wear a mask when they enter the hospital.      COVID-19 Pre-Screening Questions:  . In the past 7 to 10 days have you had a cough,  shortness of breath, headache, congestion, fever (100 or greater) body aches, chills, sore throat, or sudden loss of taste or sense of smell? no . Have you been around anyone with known Covid 19 in the past 7-10 days? no . Have you been around anyone who is awaiting Covid 19 test results in the past 7 to 10 days? no . Have you been around anyone who has mentioned symptoms of Covid 19 within the past 7 to 10 days? No  I reviewed procedure/mask/visitor instructions, COVID-19 screening questions with patient's wife (DPR), [name].

## 2020-02-18 ENCOUNTER — Encounter (HOSPITAL_COMMUNITY)
Admission: RE | Disposition: A | Discharge status: Home / Self Care | Payer: Self-pay | Source: Home / Self Care | Attending: Cardiovascular Disease

## 2020-02-18 ENCOUNTER — Ambulatory Visit (HOSPITAL_COMMUNITY)
Admission: RE | Admit: 2020-02-18 | Discharge: 2020-02-18 | Disposition: A | Discharge status: Home / Self Care | Payer: Medicare Other | Attending: Cardiovascular Disease | Admitting: Cardiovascular Disease

## 2020-02-18 ENCOUNTER — Other Ambulatory Visit: Payer: Self-pay

## 2020-02-18 DIAGNOSIS — H9190 Unspecified hearing loss, unspecified ear: Secondary | ICD-10-CM | POA: Diagnosis not present

## 2020-02-18 DIAGNOSIS — Z7951 Long term (current) use of inhaled steroids: Secondary | ICD-10-CM | POA: Diagnosis not present

## 2020-02-18 DIAGNOSIS — Z79899 Other long term (current) drug therapy: Secondary | ICD-10-CM | POA: Diagnosis not present

## 2020-02-18 DIAGNOSIS — I10 Essential (primary) hypertension: Secondary | ICD-10-CM | POA: Diagnosis not present

## 2020-02-18 DIAGNOSIS — I6522 Occlusion and stenosis of left carotid artery: Secondary | ICD-10-CM | POA: Insufficient documentation

## 2020-02-18 DIAGNOSIS — I70213 Atherosclerosis of native arteries of extremities with intermittent claudication, bilateral legs: Secondary | ICD-10-CM | POA: Diagnosis present

## 2020-02-18 DIAGNOSIS — R7303 Prediabetes: Secondary | ICD-10-CM | POA: Insufficient documentation

## 2020-02-18 DIAGNOSIS — M1990 Unspecified osteoarthritis, unspecified site: Secondary | ICD-10-CM | POA: Insufficient documentation

## 2020-02-18 DIAGNOSIS — Z791 Long term (current) use of non-steroidal anti-inflammatories (NSAID): Secondary | ICD-10-CM | POA: Diagnosis not present

## 2020-02-18 DIAGNOSIS — I739 Peripheral vascular disease, unspecified: Secondary | ICD-10-CM | POA: Diagnosis present

## 2020-02-18 DIAGNOSIS — I70212 Atherosclerosis of native arteries of extremities with intermittent claudication, left leg: Secondary | ICD-10-CM

## 2020-02-18 DIAGNOSIS — Z87891 Personal history of nicotine dependence: Secondary | ICD-10-CM | POA: Insufficient documentation

## 2020-02-18 DIAGNOSIS — I708 Atherosclerosis of other arteries: Secondary | ICD-10-CM | POA: Diagnosis not present

## 2020-02-18 DIAGNOSIS — G4730 Sleep apnea, unspecified: Secondary | ICD-10-CM | POA: Diagnosis not present

## 2020-02-18 HISTORY — PX: ABDOMINAL AORTOGRAM W/LOWER EXTREMITY: CATH118223

## 2020-02-18 SURGERY — ABDOMINAL AORTOGRAM W/LOWER EXTREMITY
Anesthesia: LOCAL | Laterality: Right

## 2020-02-18 MED ORDER — SODIUM CHLORIDE 0.9% FLUSH: 3.0000 mL | Freq: Two times a day (BID) | INTRAVENOUS | Status: DC

## 2020-02-18 MED ORDER — NITROGLYCERIN 1 MG/10 ML FOR IR/CATH LAB
INTRA_ARTERIAL | Status: AC
  Filled 2020-02-18: qty 10

## 2020-02-18 MED ORDER — ASPIRIN 81 MG PO CHEW
CHEWABLE_TABLET | ORAL | Status: AC
  Administered 2020-02-18: 81 mg via ORAL
  Filled 2020-02-18: qty 1

## 2020-02-18 MED ORDER — HEPARIN (PORCINE) IN NACL 1000-0.9 UT/500ML-% IV SOLN
INTRAVENOUS | Status: AC
  Filled 2020-02-18: qty 1000

## 2020-02-18 MED ORDER — HEPARIN (PORCINE) IN NACL 1000-0.9 UT/500ML-% IV SOLN
INTRAVENOUS | Status: DC | PRN
  Administered 2020-02-18 (×2): 500 mL

## 2020-02-18 MED ORDER — NITROGLYCERIN 1 MG/10 ML FOR IR/CATH LAB
INTRA_ARTERIAL | Status: DC | PRN
  Administered 2020-02-18: 200 ug via INTRA_ARTERIAL

## 2020-02-18 MED ORDER — SODIUM CHLORIDE 0.9 % WEIGHT BASED INFUSION: 1.0000 mL/kg/h | INTRAVENOUS | Status: DC

## 2020-02-18 MED ORDER — SODIUM CHLORIDE 0.9 % IV SOLN: INTRAVENOUS | Status: DC

## 2020-02-18 MED ORDER — IODIXANOL 320 MG/ML IV SOLN
INTRAVENOUS | Status: DC | PRN
  Administered 2020-02-18: 90 mL via INTRA_ARTERIAL

## 2020-02-18 MED ORDER — LABETALOL HCL 5 MG/ML IV SOLN: 10.0000 mg | INTRAVENOUS | Status: DC | PRN

## 2020-02-18 MED ORDER — HYDRALAZINE HCL 20 MG/ML IJ SOLN: 5.0000 mg | INTRAMUSCULAR | Status: DC | PRN

## 2020-02-18 MED ORDER — SODIUM CHLORIDE 0.9 % IV SOLN: 250.0000 mL | INTRAVENOUS | Status: DC | PRN

## 2020-02-18 MED ORDER — SODIUM CHLORIDE 0.9 % WEIGHT BASED INFUSION
3.0000 mL/kg/h | INTRAVENOUS | Status: AC
  Administered 2020-02-18: 3 mL/kg/h via INTRAVENOUS

## 2020-02-18 MED ORDER — ASPIRIN EC 81 MG PO TBEC: 81.0000 mg | DELAYED_RELEASE_TABLET | Freq: Every day | ORAL | Status: DC

## 2020-02-18 MED ORDER — ASPIRIN 81 MG PO CHEW: 81.0000 mg | CHEWABLE_TABLET | ORAL | Status: AC

## 2020-02-18 MED ORDER — LIDOCAINE HCL (PF) 1 % IJ SOLN
INTRAMUSCULAR | Status: AC
  Filled 2020-02-18: qty 30

## 2020-02-18 MED ORDER — ONDANSETRON HCL 4 MG/2ML IJ SOLN: 4.0000 mg | Freq: Four times a day (QID) | INTRAMUSCULAR | Status: DC | PRN

## 2020-02-18 MED ORDER — SODIUM CHLORIDE 0.9% FLUSH: 3.0000 mL | INTRAVENOUS | Status: DC | PRN

## 2020-02-18 MED ORDER — LIDOCAINE HCL (PF) 1 % IJ SOLN
INTRAMUSCULAR | Status: DC | PRN
  Administered 2020-02-18: 20 mL via INTRADERMAL

## 2020-02-18 MED ORDER — ACETAMINOPHEN 325 MG PO TABS: 650.0000 mg | ORAL_TABLET | ORAL | Status: DC | PRN

## 2020-02-18 SURGICAL SUPPLY — 14 items
CATH ANGIO 5F PIGTAIL 65CM (CATHETERS) ×2 IMPLANT
CATH STRAIGHT 5FR 65CM (CATHETERS) ×2 IMPLANT
CLOSURE MYNX CONTROL 5F (Vascular Products) ×2 IMPLANT
GUIDEWIRE ANGLED .035X150CM (WIRE) ×2 IMPLANT
KIT PV (KITS) ×2 IMPLANT
NEEDLE SMART 18GX3.5CM LONG (NEEDLE) ×2 IMPLANT
SHEATH PINNACLE 5F 10CM (SHEATH) ×2 IMPLANT
SHEATH PROBE COVER 6X72 (BAG) ×2 IMPLANT
STOPCOCK MORSE 400PSI 3WAY (MISCELLANEOUS) ×2 IMPLANT
SYR MEDRAD MARK 7 150ML (SYRINGE) ×2 IMPLANT
TRANSDUCER W/STOPCOCK (MISCELLANEOUS) ×2 IMPLANT
TRAY PV CATH (CUSTOM PROCEDURE TRAY) ×2 IMPLANT
TUBING CIL FLEX 10 FLL-RA (TUBING) ×2 IMPLANT
WIRE HITORQ VERSACORE ST 145CM (WIRE) ×2 IMPLANT

## 2020-02-18 NOTE — Discharge Instructions (Signed)
Angiogram, Care After This sheet gives you information about how to care for yourself after your procedure. Your health care provider may also give you more specific instructions. If you have problems or questions, contact your health care provider. What can I expect after the procedure? After the procedure, it is common to have bruising and tenderness at the catheter insertion area. Follow these instructions at home: Insertion site care  Follow instructions from your health care provider about how to take care of your insertion site. Make sure you: ? Wash your hands with soap and water before you change your bandage (dressing). If soap and water are not available, use hand sanitizer. ? Change your dressing as told by your health care provider. ? Leave stitches (sutures), skin glue, or adhesive strips in place. These skin closures may need to stay in place for 2 weeks or longer. If adhesive strip edges start to loosen and curl up, you may trim the loose edges. Do not remove adhesive strips completely unless your health care provider tells you to do that.  Do not take baths, swim, or use a hot tub until your health care provider approves.  You may shower 24-48 hours after the procedure or as told by your health care provider. ? Gently wash the site with plain soap and water. ? Pat the area dry with a clean towel. ? Do not rub the site. This may cause bleeding.  Do not apply powder or lotion to the site. Keep the site clean and dry.  Check your insertion site every day for signs of infection. Check for: ? Redness, swelling, or pain. ? Fluid or blood. ? Warmth. ? Pus or a bad smell. Activity  Rest as told by your health care provider, usually for 1-2 days.  Do not lift anything that is heavier than 10 lbs. (4.5 kg) or as told by your health care provider.  Do not drive for 24 hours if you were given a medicine to help you relax (sedative).  Do not drive or use heavy machinery while  taking prescription pain medicine. General instructions   Return to your normal activities as told by your health care provider, usually in about a week. Ask your health care provider what activities are safe for you.  If the catheter site starts bleeding, lie flat and put pressure on the site. If the bleeding does not stop, get help right away. This is a medical emergency.  Drink enough fluid to keep your urine clear or pale yellow. This helps flush the contrast dye from your body.  Take over-the-counter and prescription medicines only as told by your health care provider.  Keep all follow-up visits as told by your health care provider. This is important. Contact a health care provider if:  You have a fever or chills.  You have redness, swelling, or pain around your insertion site.  You have fluid or blood coming from your insertion site.  The insertion site feels warm to the touch.  You have pus or a bad smell coming from your insertion site.  You have bruising around the insertion site.  You notice blood collecting in the tissue around the catheter site (hematoma). The hematoma may be painful to the touch. Get help right away if:  You have severe pain at the catheter insertion area.  The catheter insertion area swells very fast.  The catheter insertion area is bleeding, and the bleeding does not stop when you hold steady pressure on the area.    The area near or just beyond the catheter insertion site becomes pale, cool, tingly, or numb. These symptoms may represent a serious problem that is an emergency. Do not wait to see if the symptoms will go away. Get medical help right away. Call your local emergency services (911 in the [place] Do not drive yourself to the hospital. Summary  After the procedure, it is common to have bruising and tenderness at the catheter insertion area.  After the procedure, it is important to rest and drink plenty of fluids.  Do not take baths,  swim, or use a hot tub until your health care provider says it is okay to do so. You may shower 24-48 hours after the procedure or as told by your health care provider.  If the catheter site starts bleeding, lie flat and put pressure on the site. If the bleeding does not stop, get help right away. This is a medical emergency. This information is not intended to replace advice given to you by your health care provider. Make sure you discuss any questions you have with your health care provider. Document Revised: 10/11/2017 Document Reviewed: 10/03/2016 Elsevier Patient Education  2020 Elsevier Inc.  

## 2020-02-18 NOTE — Interval H&P Note (Signed)
History and Physical Interval Note:  02/18/2020 7:42 AM  Alexander Hatfield.  has presented today for surgery, with the diagnosis of PAD.  The various methods of treatment have been discussed with the patient and family. After consideration of risks, benefits and other options for treatment, the patient has consented to  Procedure(s): ABDOMINAL AORTOGRAM W/LOWER EXTREMITY (Right) as a surgical intervention.  The patient's history has been reviewed, patient examined, no change in status, stable for surgery.  I have reviewed the patient's chart and labs.  Questions were answered to the patient's satisfaction.     Quay Burow

## 2020-02-18 NOTE — Progress Notes (Signed)
Patient and wife were given discharge instructions. Both verbalized understanding.

## 2020-02-22 ENCOUNTER — Telehealth: Payer: Self-pay | Admitting: Cardiovascular Disease

## 2020-02-22 NOTE — Telephone Encounter (Signed)
No reason to do a F/U PV doppler  since there was no intervention performed

## 2020-02-22 NOTE — Telephone Encounter (Signed)
[name] is calling wanting to know if Alexander Hatfield still needs to come in on Thursday for a doppler test since his procedure did not go as planned. Please advise.

## 2020-02-22 NOTE — Telephone Encounter (Signed)
Routed to MD to advise.

## 2020-02-22 NOTE — Telephone Encounter (Signed)
Advised that doppler study has already been cancelled and is not needed. Patient took prednisone peri-procedure and that helped him with his legs. Patient wanted to know if he can get a new prescription for this sent to [facility]. Explained that prednisone was Rx'ed for contrast allergy and is not something typically taken on routine basis and not typically Rx'ed by cardiology but wife wants to check with MD on this anyway.   Route to Dr. Gwenlyn Found

## 2020-02-22 NOTE — Telephone Encounter (Signed)
We cannot prescribe prednisone.  This is for contrast allergy prophylaxis.  He can talk to his PCP.

## 2020-02-23 NOTE — Telephone Encounter (Signed)
Spoke with wife and explained MD advice. She asked about tylenol and ibuprofen PRN. Advised to limit ibuprofen if he already takes meloxicam as both are NSAIDs and if he has liver issues or consumes ETOH to monitor tylenol intake. Suggested they contact PCP for other recommendations.   Per MD PV angiogram note: IMPRESSION: Alexander Hatfield has a hemodynamically significant moderate left external carotid stenosis.  I do not think this is contributing to his leg pain but rather I suspect his pain is all neuropathic from back issues.  I elected not to intervene.  I successfully performed Mynx closure achieving hemostasis.  Patient be discharged home today as an outpatient and follow-up with me in 2 weeks.  He left the lab in stable condition.  He was in considerable amount of pain lying flat on the angiographic table.  I was not able to give sedation or analgesia at the request of his pain physician.

## 2020-02-23 NOTE — Telephone Encounter (Signed)
Patient's wife returning call. 

## 2020-02-25 ENCOUNTER — Encounter (HOSPITAL_COMMUNITY): Payer: Medicare Other

## 2020-02-25 ENCOUNTER — Ambulatory Visit (HOSPITAL_COMMUNITY)
Admission: RE | Admit: 2020-02-25 | Payer: Medicare Other | Source: Ambulatory Visit | Attending: Cardiovascular Disease | Admitting: Cardiovascular Disease

## 2020-03-01 ENCOUNTER — Ambulatory Visit: Payer: Medicare Other | Admitting: Cardiovascular Disease

## 2020-03-02 ENCOUNTER — Encounter: Payer: Self-pay | Admitting: Cardiovascular Disease

## 2020-03-02 ENCOUNTER — Other Ambulatory Visit: Payer: Self-pay

## 2020-03-02 ENCOUNTER — Ambulatory Visit (INDEPENDENT_AMBULATORY_CARE_PROVIDER_SITE_OTHER): Payer: Medicare Other | Admitting: Cardiovascular Disease

## 2020-03-02 DIAGNOSIS — I739 Peripheral vascular disease, unspecified: Secondary | ICD-10-CM | POA: Diagnosis not present

## 2020-03-02 NOTE — Patient Instructions (Signed)
Medication Instructions:  Your physician recommends that you continue on your current medications as directed. Please refer to the Current Medication list given to you today.  *If you need a refill on your cardiac medications before your next appointment, please call your pharmacy*   Follow-Up: At [facility], you and your health needs are our priority.  As part of our continuing mission to provide you with exceptional heart care, we have created designated Provider Care Teams.  These Care Teams include your primary Cardiologist (physician) and Advanced Practice Providers (APPs -  Physician Assistants and Nurse Practitioners) who all work together to provide you with the care you need, when you need it.  We recommend signing up for the patient portal called "MyChart".  Sign up information is provided on this After Visit Summary.  MyChart is used to connect with patients for Virtual Visits (Telemedicine).  Patients are able to view lab/test results, encounter notes, upcoming appointments, etc.  Non-urgent messages can be sent to your provider as well.   To learn more about what you can do with MyChart, go to [email or web address].    Your next appointment:   AS NEEDED with Dr. Gwenlyn Found

## 2020-03-02 NOTE — Progress Notes (Signed)
03/02/2020 Alexander Hatfield   March 05, 1956  [id number]  Primary Physician Celene Squibb, MD Primary Cardiologist: Lorretta Harp MD Lupe Carney, [place]  HPI:  Alexander Hatfield. is a 64 y.o.   severely overweight married Caucasian male father of 1 son referred by Dr. Johnsie Cancel for PAD evaluation.  I last spoke to him for a telephone virtual telemedicine visit 01/28/2020.Marland KitchenHe has been disabled since age 67 because of back issues. He was a Engineer, structural. He stopped smoking 30 years ago because of the problems include hypertension hyperlipidemia both treated, is never had a heart attack or stroke. Is no family history for heart disease. He denies chest pain but has developed shortness of breath since he was diagnosed with squamous cell lung cancer and has undergone radiation therapy. He does occasionally walk with a cane and has symptomatic leg discomfort when he walks plus or minus calf claudication. Previous ABIs have been in the mid 0.7 range.  We obtained Doppler studies on 01/11/2020 revealing a right ankle-brachial index of 0.84 and a left of 0.92.  He did appear to have monophasic waveforms in both common femoral arteries and SFAs with a high-grade left iliac artery stenosis.  He does have lifestyle limiting claudication and is really unable to walk more than 15 or 20 yards without having to stop.    I performed outpatient peripheral angiography on him 02/18/2020 revealing a 60% proximal left extra iliac artery stenosis with a 30 to 30 mm pullback gradient.  I did not think this was contributing to his bilateral lower extremity discomfort but more like likely this was neuropathic.  Current Meds  Medication Sig  . amLODipine (NORVASC) 10 MG tablet Take 10 mg by mouth daily.   Marland Kitchen atorvastatin (LIPITOR) 40 MG tablet Take 40 mg by mouth at bedtime.  . baclofen (LIORESAL) 10 MG tablet Take 10 mg by mouth 3 (three) times daily.  Marland Kitchen BREO ELLIPTA 200-25 MCG/INH AEPB Inhale 1 puff into the lungs  daily.  . calcium carbonate (OSCAL) 1500 (600 Ca) MG TABS tablet Take 600 mg of elemental calcium by mouth daily at 12 noon.  . diphenhydrAMINE (BENADRYL) 25 MG tablet Take 25 mg by mouth every 6 (six) hours as needed.  . furosemide (LASIX) 20 MG tablet Take 20 mg by mouth daily.   Marland Kitchen gabapentin (NEURONTIN) 300 MG capsule Take 600 mg by mouth 3 (three) times daily.   Marland Kitchen ipratropium-albuterol (DUONEB) 0.5-2.5 (3) MG/3ML SOLN Inhale 3 mLs into the lungs every 4 (four) hours as needed (shortness of breath).   . losartan (COZAAR) 100 MG tablet Take 100 mg by mouth daily.   . meloxicam (MOBIC) 15 MG tablet Take 15 mg by mouth daily.   Marland Kitchen NARCAN 4 MG/0.1ML LIQD nasal spray kit Place 1 spray into the nose See admin instructions. Administer a single spray in one nostril upon signs of opioid overdose.  Call 911.  Repeat after 3 minutes if no response.  . oxyCODONE (ROXICODONE) 15 MG immediate release tablet Take 15 mg by mouth 4 (four) times daily.   . potassium chloride SA (K-DUR,KLOR-CON) 20 MEQ tablet Take 20 mEq by mouth every evening.   . predniSONE (DELTASONE) 50 MG tablet TAKE 1 TABLET 13 HOURS PRIOR TO PROCEDURE, TAKE 1 TABLET 7 HOURS PRIOR TO PROCEDURE AND 1 TABLET PRIOR TO LEAVING FOR THE PROCEDURE (Patient taking differently: Take 50 mg by mouth See admin instructions. TAKE 1 TABLET 13 HOURS PRIOR TO PROCEDURE,  TAKE 1 TABLET 7 HOURS PRIOR TO PROCEDURE AND 1 TABLET PRIOR TO LEAVING FOR THE PROCEDURE)  . prochlorperazine (COMPAZINE) 10 MG tablet Take 1 tablet (10 mg total) by mouth every 6 (six) hours as needed for nausea or vomiting.  . Umeclidinium-Vilanterol (ANORO ELLIPTA IN) Inhale 1 puff into the lungs daily at 12 noon.    Current Facility-Administered Medications for the 03/02/20 encounter (Office Visit) with ,  J, MD  Medication  . sodium chloride flush (NS) 0.9 % injection 3 mL     Allergies  Allergen Reactions  . Bee Venom Other (See Comments)    Dizzy/ swim head  . Iohexol  Other (See Comments)     Desc: PT STATES THAT WHEN GIVEN THE CONTRAST HIS LEGS BURN AND HE PASSES OUT, COMA FOR 7DAY. DR BOLES WOULD NOT PRE MEDICATE DUE TO SEVERITY OF REACTION, STUDY WAS CX  Isovue Preservative caused the problem      Social History   Socioeconomic History  . Marital status: Married    Spouse name: Not on file  . Number of children: Not on file  . Years of education: Not on file  . Highest education level: Not on file  Occupational History  . Not on file  Tobacco Use  . Smoking status: Former Smoker    Types: Cigarettes  . Smokeless tobacco: Never Used  . Tobacco comment: Quit smoking cigarettes 15 years ago  Substance and Sexual Activity  . Alcohol use: No  . Drug use: No  . Sexual activity: Not Currently  Other Topics Concern  . Not on file  Social History Narrative  . Not on file   Social Determinants of Health   Financial Resource Strain:   . Difficulty of Paying Living Expenses:   Food Insecurity:   . Worried About Running Out of Food in the Last Year:   . Ran Out of Food in the Last Year:   Transportation Needs:   . Lack of Transportation (Medical):   . Lack of Transportation (Non-Medical):   Physical Activity:   . Days of Exercise per Week:   . Minutes of Exercise per Session:   Stress:   . Feeling of Stress :   Social Connections:   . Frequency of Communication with Friends and Family:   . Frequency of Social Gatherings with Friends and Family:   . Attends Religious Services:   . Active Member of Clubs or Organizations:   . Attends Club or Organization Meetings:   . Marital Status:   Intimate Partner Violence:   . Fear of Current or Ex-Partner:   . Emotionally Abused:   . Physically Abused:   . Sexually Abused:      Review of Systems: General: negative for chills, fever, night sweats or weight changes.  Cardiovascular: negative for chest pain, dyspnea on exertion, edema, orthopnea, palpitations, paroxysmal nocturnal dyspnea or  shortness of breath Dermatological: negative for rash Respiratory: negative for cough or wheezing Urologic: negative for hematuria Abdominal: negative for nausea, vomiting, diarrhea, bright red blood per rectum, melena, or hematemesis Neurologic: negative for visual changes, syncope, or dizziness All other systems reviewed and are otherwise negative except as noted above.    Blood pressure 127/63, pulse 82, height 6' 1" (1.854 m), weight (!) 347 lb (157.4 kg), SpO2 93 %.  General appearance: alert and no distress Neck: no adenopathy, no carotid bruit, no JVD, supple, symmetrical, trachea midline and thyroid not enlarged, symmetric, no tenderness/mass/nodules Lungs: clear to auscultation bilaterally Heart: regular   rate and rhythm, S1, S2 normal, no murmur, click, rub or gallop Extremities: extremities normal, atraumatic, no cyanosis or edema Pulses: 2+ and symmetric Skin: Skin color, texture, turgor normal. No rashes or lesions Neurologic: Alert and oriented X 3, normal strength and tone. Normal symmetric reflexes. Normal coordination and gait  EKG not performed today  ASSESSMENT AND PLAN:   Peripheral arterial disease (HCC) Mr. Panas returns today for his first postoperative visit after his recent peripheral angiogram which I performed 02/18/2020.  He has chronic pain in both legs.  I did demonstrate an 60% left external iliac artery stenosis with a 30 mm pullback gradient.  I did not think this was contributing to his symptoms but more likely symptoms are neuropathic from back issues.  He is seeing a neurosurgeon.  I will see him back as needed.       J.  MD FACP,FACC,FAHA, FSCAI 03/02/2020 12:20 PM 

## 2020-03-02 NOTE — Assessment & Plan Note (Signed)
Alexander Hatfield returns today for his first postoperative visit after his recent peripheral angiogram which I performed 02/18/2020.  He has chronic pain in both legs.  I did demonstrate an 60% left external iliac artery stenosis with a 30 mm pullback gradient.  I did not think this was contributing to his symptoms but more likely symptoms are neuropathic from back issues.  He is seeing a Publishing rights manager.  I will see him back as needed.

## 2020-04-04 ENCOUNTER — Other Ambulatory Visit: Payer: Self-pay | Admitting: Neurosurgery

## 2020-04-04 DIAGNOSIS — G959 Disease of spinal cord, unspecified: Secondary | ICD-10-CM

## 2020-04-14 ENCOUNTER — Other Ambulatory Visit: Payer: Self-pay | Admitting: Physician Assistant

## 2020-04-15 ENCOUNTER — Ambulatory Visit (HOSPITAL_COMMUNITY)
Admission: RE | Admit: 2020-04-15 | Discharge: 2020-04-15 | Disposition: A | Discharge status: Home / Self Care | Payer: Medicare Other | Source: Ambulatory Visit | Attending: Neurosurgery | Admitting: Neurosurgery

## 2020-04-15 ENCOUNTER — Other Ambulatory Visit: Payer: Self-pay

## 2020-04-15 DIAGNOSIS — G039 Meningitis, unspecified: Secondary | ICD-10-CM | POA: Diagnosis not present

## 2020-04-15 DIAGNOSIS — Z981 Arthrodesis status: Secondary | ICD-10-CM | POA: Diagnosis not present

## 2020-04-15 DIAGNOSIS — G959 Disease of spinal cord, unspecified: Secondary | ICD-10-CM

## 2020-04-15 DIAGNOSIS — M5136 Other intervertebral disc degeneration, lumbar region: Secondary | ICD-10-CM | POA: Diagnosis not present

## 2020-04-15 DIAGNOSIS — M47816 Spondylosis without myelopathy or radiculopathy, lumbar region: Secondary | ICD-10-CM | POA: Diagnosis not present

## 2020-04-15 DIAGNOSIS — M4802 Spinal stenosis, cervical region: Secondary | ICD-10-CM | POA: Diagnosis not present

## 2020-04-15 DIAGNOSIS — M48061 Spinal stenosis, lumbar region without neurogenic claudication: Secondary | ICD-10-CM | POA: Insufficient documentation

## 2020-04-15 MED ORDER — LIDOCAINE HCL (PF) 1 % IJ SOLN
5.0000 mL | Freq: Once | INTRAMUSCULAR | Status: AC
  Administered 2020-04-15: 5 mL

## 2020-04-15 MED ORDER — IOHEXOL 300 MG/ML  SOLN
10.0000 mL | Freq: Once | INTRAMUSCULAR | Status: AC | PRN
  Administered 2020-04-15: 10 mL via INTRATHECAL

## 2020-04-15 NOTE — Discharge Instructions (Signed)

## 2020-04-15 NOTE — Procedures (Signed)
Uncomplicated lumbar puncture for myelogram.  10 cc of Omnipaque 300 injected and distributed from the lumbar to cervical canal. See procedure note.  JWatts MD

## 2020-04-22 ENCOUNTER — Telehealth: Payer: Self-pay

## 2020-04-22 NOTE — Telephone Encounter (Signed)
Received TC from patients wife stating that the patient was scheduled for a CT on 6/18 and the CT was scheduled with contrast and patient is allergic to contrast. Returned TC to patients wife Alexander Hatfield and let her know that the CT appointment on 6/18 is scheduled CT CHEST WO contrast. Wife verbalized understanding.

## 2020-04-26 ENCOUNTER — Telehealth: Payer: Self-pay | Admitting: Medical Oncology

## 2020-04-26 NOTE — Telephone Encounter (Signed)
Per Julien Nordmann I told wife that Julien Nordmann wants CT without contrast.  Pt allergic to CT dye .  " Dr Gwenlyn Found gave him the prednisone prep ahead of time  for a CT of spine and Bobak did fine".

## 2020-04-27 ENCOUNTER — Telehealth: Payer: Self-pay | Admitting: Internal Medicine

## 2020-04-27 NOTE — Telephone Encounter (Signed)
Scheduled apt per 6/15 sch msg - pt wife is aware of appts added.

## 2020-04-29 ENCOUNTER — Other Ambulatory Visit: Payer: Self-pay

## 2020-04-29 ENCOUNTER — Ambulatory Visit (HOSPITAL_COMMUNITY)
Admission: RE | Admit: 2020-04-29 | Discharge: 2020-04-29 | Disposition: A | Discharge status: Home / Self Care | Payer: Medicare Other | Source: Ambulatory Visit | Attending: Internal Medicine | Admitting: Internal Medicine

## 2020-04-29 DIAGNOSIS — C3490 Malignant neoplasm of unspecified part of unspecified bronchus or lung: Secondary | ICD-10-CM | POA: Diagnosis present

## 2020-05-05 ENCOUNTER — Inpatient Hospital Stay: Payer: Medicare Other | Attending: Internal Medicine | Admitting: Internal Medicine

## 2020-05-05 ENCOUNTER — Inpatient Hospital Stay: Payer: Medicare Other

## 2020-05-05 ENCOUNTER — Other Ambulatory Visit: Payer: Self-pay

## 2020-05-05 ENCOUNTER — Encounter: Payer: Self-pay | Admitting: Internal Medicine

## 2020-05-05 VITALS — BP 121/79 | HR 83 | Temp 98.6°F | Resp 19 | Ht 73.0 in | Wt 341.3 lb

## 2020-05-05 DIAGNOSIS — I10 Essential (primary) hypertension: Secondary | ICD-10-CM | POA: Diagnosis not present

## 2020-05-05 DIAGNOSIS — I739 Peripheral vascular disease, unspecified: Secondary | ICD-10-CM | POA: Diagnosis not present

## 2020-05-05 DIAGNOSIS — C3491 Malignant neoplasm of unspecified part of right bronchus or lung: Secondary | ICD-10-CM | POA: Diagnosis not present

## 2020-05-05 DIAGNOSIS — G8929 Other chronic pain: Secondary | ICD-10-CM | POA: Diagnosis not present

## 2020-05-05 DIAGNOSIS — C3490 Malignant neoplasm of unspecified part of unspecified bronchus or lung: Secondary | ICD-10-CM | POA: Diagnosis not present

## 2020-05-05 DIAGNOSIS — C3411 Malignant neoplasm of upper lobe, right bronchus or lung: Secondary | ICD-10-CM | POA: Diagnosis not present

## 2020-05-05 DIAGNOSIS — Z95828 Presence of other vascular implants and grafts: Secondary | ICD-10-CM

## 2020-05-05 DIAGNOSIS — M545 Low back pain: Secondary | ICD-10-CM | POA: Diagnosis not present

## 2020-05-05 LAB — CBC WITH DIFFERENTIAL (CANCER CENTER ONLY)
Abs Immature Granulocytes: 0.02 10*3/uL (ref 0.00–0.07)
Basophils Absolute: 0 10*3/uL (ref 0.0–0.1)
Basophils Relative: 1 %
Eosinophils Absolute: 0.5 10*3/uL (ref 0.0–0.5)
Eosinophils Relative: 7 %
HCT: 43.9 % (ref 39.0–52.0)
Hemoglobin: 13.9 g/dL (ref 13.0–17.0)
Immature Granulocytes: 0 %
Lymphocytes Relative: 18 %
Lymphs Abs: 1.2 10*3/uL (ref 0.7–4.0)
MCH: 29.5 pg (ref 26.0–34.0)
MCHC: 31.7 g/dL (ref 30.0–36.0)
MCV: 93.2 fL (ref 80.0–100.0)
Monocytes Absolute: 0.6 10*3/uL (ref 0.1–1.0)
Monocytes Relative: 9 %
Neutro Abs: 4.4 10*3/uL (ref 1.7–7.7)
Neutrophils Relative %: 65 %
Platelet Count: 227 10*3/uL (ref 150–400)
RBC: 4.71 MIL/uL (ref 4.22–5.81)
RDW: 14 % (ref 11.5–15.5)
WBC Count: 6.8 10*3/uL (ref 4.0–10.5)
nRBC: 0 % (ref 0.0–0.2)

## 2020-05-05 LAB — CMP (CANCER CENTER ONLY)
ALT: 14 U/L (ref 0–44)
AST: 15 U/L (ref 15–41)
Albumin: 3.8 g/dL (ref 3.5–5.0)
Alkaline Phosphatase: 112 U/L (ref 38–126)
Anion gap: 8 (ref 5–15)
BUN: 10 mg/dL (ref 8–23)
CO2: 29 mmol/L (ref 22–32)
Calcium: 9.4 mg/dL (ref 8.9–10.3)
Chloride: 105 mmol/L (ref 98–111)
Creatinine: 0.84 mg/dL (ref 0.61–1.24)
GFR, Est AFR Am: >60 mL/min (ref >60)
GFR, Estimated: >60 mL/min (ref >60)
Glucose, Bld: 116 mg/dL — ABNORMAL HIGH (ref 70–99)
Potassium: 4.2 mmol/L (ref 3.5–5.1)
Sodium: 142 mmol/L (ref 135–145)
Total Bilirubin: 0.7 mg/dL (ref 0.3–1.2)
Total Protein: 7 g/dL (ref 6.5–8.1)

## 2020-05-05 MED ORDER — HEPARIN SOD (PORK) LOCK FLUSH 100 UNIT/ML IV SOLN
500.0000 [IU] | Freq: Once | INTRAVENOUS | Status: AC
  Administered 2020-05-05: 500 [IU]
  Filled 2020-05-05: qty 5

## 2020-05-05 MED ORDER — SODIUM CHLORIDE 0.9% FLUSH
10.0000 mL | Freq: Once | INTRAVENOUS | Status: AC
  Administered 2020-05-05: 10 mL
  Filled 2020-05-05: qty 10

## 2020-05-05 NOTE — Progress Notes (Signed)
[facility] Telephone:([phone]) [phone]   [phone]) [phone]  OFFICE PROGRESS NOTE  Celene Squibb, MD [address] [place] [address]  DIAGNOSIS: Stage IIIA (T2b, N2, M0) non-small cell lung cancer, squamous cell carcinoma presented with right upper lobe lung mass in addition to right paratracheal lymphadenopathy diagnosed in December 2019. The patient also has a hypermetabolic lesion in the left parotid gland that need further evaluation.  PRIOR THERAPY:   1) concurrent chemoradiation with weekly carboplatin for AUC of 2 and paclitaxel 45 mg/M2.  First dose started on 12/02/2018. Status post 6 cycles.  Last cycle was given on January 05, 2019 the patient is receiving radiation closer to home in [place], [place] completed January 12, 2019. 2) Consolidation treatment with immunotherapy with Imfinzi 10 mg/KG IV every 2 weeks.  First dose was given February 05, 2019. Status post 26 cycles.  CURRENT THERAPY:  Observation.  INTERVAL HISTORY: Alexander Hatfield. 64 y.o. male returns to the clinic today for follow-up visit.  The patient is feeling fine today with no concerning complaints except for the chronic back pain.  He is currently seen by Dr. Saintclair Halsted and had several studies including MRI of the thoracolumbar spines.  The patient has degenerative disc disease as well as spondylosis.  He is expected to receive a steroid injection to this area soon.  He is not playing golf anymore because of the pain.  He denied having any current chest pain, shortness of breath, cough or hemoptysis.  He denied having any fever or chills.  He has no nausea, vomiting, diarrhea or constipation.  The patient is here today for evaluation with repeat CT scan of the chest for restaging of his disease.   MEDICAL HISTORY: Past Medical History:  Diagnosis Date  . Arthritis   . Chronic back pain   . Chronic back pain   . Headache   . HOH (hard of hearing)   . Hypertension   . Lung mass   .  Pre-diabetes   . Sleep apnea    wears cpap  . Wears glasses   . Wears partial dentures     ALLERGIES:  is allergic to bee venom and iohexol.  MEDICATIONS:  Current Outpatient Medications  Medication Sig Dispense Refill  . amLODipine (NORVASC) 10 MG tablet Take 10 mg by mouth daily.     Marland Kitchen atorvastatin (LIPITOR) 40 MG tablet Take 40 mg by mouth at bedtime.    . baclofen (LIORESAL) 10 MG tablet Take 10 mg by mouth 3 (three) times daily.    Marland Kitchen BREO ELLIPTA 200-25 MCG/INH AEPB Inhale 1 puff into the lungs daily as needed (asthma).     . Calcium-Magnesium-Zinc (CAL-MAG-ZINC PO) Take 1 tablet by mouth daily.    . diphenhydrAMINE (BENADRYL) 25 MG tablet Take 25 mg by mouth every 6 (six) hours as needed for allergies.     . furosemide (LASIX) 20 MG tablet Take 20 mg by mouth daily.     Marland Kitchen gabapentin (NEURONTIN) 300 MG capsule Take 600 mg by mouth 3 (three) times daily.     Marland Kitchen ipratropium-albuterol (DUONEB) 0.5-2.5 (3) MG/3ML SOLN Inhale 3 mLs into the lungs every 4 (four) hours as needed (shortness of breath).     . losartan (COZAAR) 100 MG tablet Take 100 mg by mouth daily.     . meloxicam (MOBIC) 15 MG tablet Take 15 mg by mouth daily.     Marland Kitchen NARCAN 4 MG/0.1ML LIQD  nasal spray kit Place 1 spray into the nose See admin instructions. Administer a single spray in one nostril upon signs of opioid overdose.  Call 911.  Repeat after 3 minutes if no response.    . oxyCODONE (ROXICODONE) 15 MG immediate release tablet Take 15 mg by mouth 4 (four) times daily.     . potassium chloride SA (K-DUR,KLOR-CON) 20 MEQ tablet Take 20 mEq by mouth every evening.     . predniSONE (DELTASONE) 50 MG tablet TAKE 1 TABLET 13 HOURS PRIOR TO PROCEDURE, TAKE 1 TABLET 7 HOURS PRIOR TO PROCEDURE AND 1 TABLET PRIOR TO LEAVING FOR THE PROCEDURE (Patient not taking: Reported on 04/12/2020) 3 tablet 0  . prochlorperazine (COMPAZINE) 10 MG tablet Take 1 tablet (10 mg total) by mouth every 6 (six) hours as needed for nausea or  vomiting. 30 tablet 0  . Umeclidinium-Vilanterol (ANORO ELLIPTA IN) Inhale 1 puff into the lungs daily as needed (SOB/ wheezing).      Current Facility-Administered Medications  Medication Dose Route Frequency Provider Last Rate Last Admin  . sodium chloride flush (NS) 0.9 % injection 3 mL  3 mL Intravenous Q12H Lorretta Harp, MD        SURGICAL HISTORY:  Past Surgical History:  Procedure Laterality Date  . ABDOMINAL AORTOGRAM W/LOWER EXTREMITY Right 02/18/2020   Procedure: ABDOMINAL AORTOGRAM W/LOWER EXTREMITY;  Surgeon: Lorretta Harp, MD;  Location: [facility];  Service: Cardiovascular;  Laterality: Right;  . ANTERIOR CERVICAL DECOMP/DISCECTOMY FUSION N/A 10/27/2018   Procedure: ANTERIOR CERVICAL THREE-FOUR, FOUR-FIVE DECOMPRESSION/DISCECTOMY FUSION TWO LEVELS;  Surgeon: Kary Kos, MD;  Location: [facility];  Service: Neurosurgery;  Laterality: N/A;   ANTERIOR CERVICAL THREE-FOUR, FOUR-FIVE DECOMPRESSION/DISCECTOMY FUSION TWO LEVELS  . BACK SURGERY    . IR IMAGING GUIDED PORT INSERTION  11/27/2018  . MULTIPLE TOOTH EXTRACTIONS    . SPINAL CORD STIMULATOR IMPLANT    . VIDEO BRONCHOSCOPY N/A 10/29/2018   Procedure: VIDEO BRONCHOSCOPY;  Surgeon: Laurin Coder, MD;  Location: [facility];  Service: Thoracic;  Laterality: N/A;  . VIDEO BRONCHOSCOPY WITH ENDOBRONCHIAL ULTRASOUND N/A 10/27/2018   Procedure: VIDEO BRONCHOSCOPY WITH ENDOBRONCHIAL ULTRASOUND;  Surgeon: Laurin Coder, MD;  Location: [facility];  Service: Thoracic;  Laterality: N/A;  . VIDEO BRONCHOSCOPY WITH ENDOBRONCHIAL ULTRASOUND N/A 10/29/2018   Procedure: VIDEO BRONCHOSCOPY WITH ENDOBRONCHIAL ULTRASOUND;  Surgeon: Laurin Coder, MD;  Location: [facility];  Service: Thoracic;  Laterality: N/A;    REVIEW OF SYSTEMS:  A comprehensive review of systems was negative except for: Constitutional: positive for fatigue Musculoskeletal: positive for back pain   PHYSICAL EXAMINATION: General appearance: alert, cooperative,  fatigued and no distress Head: Normocephalic, without obvious abnormality, atraumatic Neck: no adenopathy, no JVD, supple, symmetrical, trachea midline and thyroid not enlarged, symmetric, no tenderness/mass/nodules Lymph nodes: Cervical, supraclavicular, and axillary nodes normal. Resp: clear to auscultation bilaterally Back: symmetric, no curvature. ROM normal. No CVA tenderness. Cardio: regular rate and rhythm, S1, S2 normal, no murmur, click, rub or gallop GI: soft, non-tender; bowel sounds normal; no masses,  no organomegaly Extremities: extremities normal, atraumatic, no cyanosis or edema  ECOG PERFORMANCE STATUS: 1 - Symptomatic but completely ambulatory  Blood pressure 121/79, pulse 83, temperature 98.6 F (37 C), temperature source Temporal, resp. rate 19, height _0  (1.854 m), weight (!) 341 lb 4.8 oz (154.8 kg), SpO2 90 %.  LABORATORY DATA: Lab Results  Component Value Date   WBC 7.2 02/04/2020   HGB 14.1 02/04/2020   HCT 44.7  02/04/2020   MCV 92.7 02/04/2020   PLT 256 02/04/2020      Chemistry      Component Value Date/Time   NA 142 02/04/2020 1121   K 4.2 02/04/2020 1121   CL 104 02/04/2020 1121   CO2 29 02/04/2020 1121   BUN 11 02/04/2020 1121   CREATININE 0.86 02/04/2020 1121      Component Value Date/Time   CALCIUM 9.3 02/04/2020 1121   ALKPHOS 129 (H) 02/04/2020 1121   AST 13 (L) 02/04/2020 1121   ALT 14 02/04/2020 1121   BILITOT 0.5 02/04/2020 1121       RADIOGRAPHIC STUDIES: CT Chest Wo Contrast  Result Date: 05/02/2020 CLINICAL DATA:  Non-small-cell lung cancer diagnosed December of 2019. Status post chemotherapy and radiation therapy. Immunotherapy. Restaging. EXAM: CT CHEST WITHOUT CONTRAST TECHNIQUE: Multidetector CT imaging of the chest was performed following the standard protocol without IV contrast. COMPARISON:  [id number] 02/01/2020 FINDINGS: Cardiovascular: Right Port-A-Cath tip at high right atrium. Aortic and branch vessel atherosclerosis.  Ascending aortic dilatation, 4.1 cm on 79/2, similar. Tortuous thoracic aorta. Mild cardiomegaly, without pericardial effusion. Multivessel coronary artery atherosclerosis. Pulmonary artery enlargement, outflow tract 3.4 cm. Mediastinum/Nodes: Right-sided thyroid enlargement with a nodule or conglomerate of nodules measuring up to 2.6 cm on 17/2, similar. Mildly enlarged right paratracheal node of 1.1 cm on 57/2, similar. A node within the azygoesophageal recess measures 1.1 cm today versus 1.3 cm on the prior. Hilar regions poorly evaluated without intravenous contrast. Lungs/Pleura: No pleural fluid. No pleural fluid. Moderate centrilobular emphysema. Scattered bilateral pulmonary nodules are similar, on the order of 4 mm and less. Some of these are calcified, consistent with granulomas. Similar appearance of architectural distortion and consolidation within the posterior right upper lobe and adjacent superior segment right lower lobe. No locally recurrent disease. Upper Abdomen: Calcifications within the spleen is likely due to prior infection or trauma. Normal imaged portions of the liver, stomach, adrenal glands, kidneys. Fatty replacement throughout the pancreas. Musculoskeletal: Moderate bilateral gynecomastia. No acute osseous abnormality. Dorsal spinal stimulator. IMPRESSION: 1. Similar appearance of radiation induced consolidation within the posterior right upper lobe and adjacent superior segment right lower lobe. No findings of locally recurrent or metastatic disease. 2. Similar to decreased thoracic adenopathy, nonspecific. 3. Bilateral gynecomastia. 4. Right-sided thyroid nodule or conglomerate of nodules. If not already performed, thyroid ultrasound is recommended. (Ref: J Am Coll Radiol. 2015 Feb;12(2): 143-50). 5. Aortic Atherosclerosis (ICD10-I70.0) and Emphysema (ICD10-J43.9). Coronary artery atherosclerosis. Electronically Signed   By: Abigail Miyamoto M.D.   On: 05/02/2020 11:07   CT CERVICAL  SPINE W CONTRAST  Result Date: 04/15/2020 CLINICAL DATA:  Neurogenic claudication.  Spinal stimulator. FLUOROSCOPY TIME:  2.9 minutes.  145.10 mGy PROCEDURE: LUMBAR PUNCTURE FOR CERVICAL LUMBAR AND THORACIC MYELOGRAM CERVICAL AND LUMBAR AND THORACIC MYELOGRAM CT CERVICAL MYELOGRAM CT LUMBAR MYELOGRAM CT THORACIC MYELOGRAM Written and oral informed consent was obtained from the patient personally. Patient has Compazine on his med list but hasnt used it. There is history of severe reaction to prior myelography decades ago. Reportedly this was a reaction to an Isovue preservative. The patient completed his premedication regimen and recently was exposed to iodinated contrast for lower extremity angiogram, without adverse event. Patient was positioned prone on the fluoroscopy table. Local anesthesia was provided with 1% lidocaine without epinephrine after prepped and draped in the usual sterile fashion. Puncture was performed at L2-3 using a 5 inch 22-gauge spinal needle via midline approach. Using a single pass through the dura,  the needle was placed within the thecal sac, with return of clear CSF. 10 mL Isovue M-300 was injected into the thecal sac, with normal opacification of the nerve roots and cauda equina consistent with free flow within the subarachnoid space. The patient was then moved to the trendelenburg position and contrast flowed into the Thoracic and Cervical spine regions. I personally performed the lumbar puncture and administered the intrathecal contrast. I also personally supervised acquisition of the myelogram images. TECHNIQUE: Contiguous axial images were obtained through the Cervical, Thoracic, and Lumbar spine after the intrathecal infusion of infusion. Coronal and sagittal reconstructions were obtained of the axial image sets. FINDINGS: Diagnostic myelography images were not obtained given patient's size , inability to stand, and dose considerations. There has been 2 level PLIF with presumed  arachnoiditis at these levels. There is also adjacent segment degeneration with thecal sac narrowing. The patient's body habitus does not allow for typical table tilting and shoulder support such that contrast was advanced into the thoracic spine in the right lateral decubitus position. There is no impediment to contrast passage through the thoracic spine. Detection of cervical canal opacification is difficult due to the patient's body habitus, but contrast was confidently seen reaching the cervical levels. Standing provocative maneuvers in the lumbar spine were not performed as the patient is very unsteady. CT CERVICAL MYELOGRAM FINDINGS: Alignment: Fused reversal of cervical lordosis. Vertebrae: C3-C7 ACDF. Solid arthrodesis seen at each level, best seen at C3-4 on coronal reformats. Ventral plate at M1-D6 without complicating feature. No superimposed bony erosion or fracture. Cord: No abnormal intrathecal filling defect or cord deformity. Extra-spinal: No significant finding Disc levels: C2-3: Degenerative facet spurring that is mild. Shallow central disc protrusion without neural compression. C3-4: ACDF with solid arthrodesis based on reformats. There is right paracentral residual ridging which mildly deforms the ventral cord. C4-5: ACDF with solid arthrodesis.  No bony impingement C5-6: ACDF with solid arthrodesis and no impingement C6-7: ACDF with solid arthrodesis and no impingement C7-T1:Degenerative facet spurring that is greater on the left where there is mild to moderate foraminal narrowing. Degenerative disc narrowing. No cord compression. CT LUMBAR MYELOGRAM FINDINGS: Segmentation: 5 lumbar type vertebrae Alignment: Loss of lordosis Vertebrae: No evidence of fracture or bone lesion. Solid arthrodesis from L3-S1 Conus: Tip terminates at L1-2. There is prominent clumping of nerve roots at L3 and below, with poor filling of the thecal sac at L5 and S1, due to arachnoiditis. Presumably related saccular,  patulous appearance of the thecal sac towards the right at L5-S1. Extra-spinal: Diffuse atherosclerosis. Disc levels: T12- L1: Disc degeneration and facet spurring without cord impingement. L1-L2: Posterior element hypertrophy and mild disc narrowing with bulge. There is moderate narrowing of the thecal sac which was even more prominent on the myelography. Mild right foraminal narrowing from facet hypertrophy L2-L3: Facet osteoarthritis. Negative disc space. No neural impingement L3-L4: PLIF with solid arthrodesis and no impingement L4-L5: PLIF with solid arthrodesis and no impingement L5-S1:Solid arthrodesis with no visible impingement. CT THORACIC MYELOGRAM FINDINGS: Alignment: Negative for listhesis. Mildly exaggerated thoracic kyphosis. Vertebrae: No evidence of fracture or metastatic lesion. Cord: Normal shape and volume. Extra-spinal: Post treatment changes seen in the right lung. Multifocal atherosclerotic calcification. Spinal stimulator at T9-10, entering the inter spinous space at T10-11. Disc levels: Diffuse degenerative disc narrowing and endplate ridging. Mainly lower thoracic facet hypertrophy/spurring. No cord impingement. Facet spurring causes bilateral foraminal narrowing at T9-10 to T11-12. IMPRESSION: Cervical spine CT: C3-C7 ACDF with solid arthrodesis. Mild spinal stenosis  and ventral cord deformity at C3-4 due to residual endplate ridging. Thoracic spine: 1. Diffuse spondylosis and disc degeneration without cord impingement. 2. Bilateral foraminal narrowing at T9-10 to T11-12. Lumbar spine: 1. Marked arachnoiditis at L3 and below with associated poor filling of the lower thecal sac. 2. L3-S1 solid arthrodesis. 3. L1-2 and L2-3 degenerative disease with moderate spinal stenosis at L1-2. Electronically Signed   By: Monte Fantasia M.D.   On: 04/15/2020 10:59   CT THORACIC SPINE W CONTRAST  Result Date: 04/15/2020 CLINICAL DATA:  Neurogenic claudication.  Spinal stimulator. FLUOROSCOPY TIME:   2.9 minutes.  145.10 mGy PROCEDURE: LUMBAR PUNCTURE FOR CERVICAL LUMBAR AND THORACIC MYELOGRAM CERVICAL AND LUMBAR AND THORACIC MYELOGRAM CT CERVICAL MYELOGRAM CT LUMBAR MYELOGRAM CT THORACIC MYELOGRAM Written and oral informed consent was obtained from the patient personally. Patient has Compazine on his med list but hasnt used it. There is history of severe reaction to prior myelography decades ago. Reportedly this was a reaction to an Isovue preservative. The patient completed his premedication regimen and recently was exposed to iodinated contrast for lower extremity angiogram, without adverse event. Patient was positioned prone on the fluoroscopy table. Local anesthesia was provided with 1% lidocaine without epinephrine after prepped and draped in the usual sterile fashion. Puncture was performed at L2-3 using a 5 inch 22-gauge spinal needle via midline approach. Using a single pass through the dura, the needle was placed within the thecal sac, with return of clear CSF. 10 mL Isovue M-300 was injected into the thecal sac, with normal opacification of the nerve roots and cauda equina consistent with free flow within the subarachnoid space. The patient was then moved to the trendelenburg position and contrast flowed into the Thoracic and Cervical spine regions. I personally performed the lumbar puncture and administered the intrathecal contrast. I also personally supervised acquisition of the myelogram images. TECHNIQUE: Contiguous axial images were obtained through the Cervical, Thoracic, and Lumbar spine after the intrathecal infusion of infusion. Coronal and sagittal reconstructions were obtained of the axial image sets. FINDINGS: Diagnostic myelography images were not obtained given patient's size , inability to stand, and dose considerations. There has been 2 level PLIF with presumed arachnoiditis at these levels. There is also adjacent segment degeneration with thecal sac narrowing. The patient's body  habitus does not allow for typical table tilting and shoulder support such that contrast was advanced into the thoracic spine in the right lateral decubitus position. There is no impediment to contrast passage through the thoracic spine. Detection of cervical canal opacification is difficult due to the patient's body habitus, but contrast was confidently seen reaching the cervical levels. Standing provocative maneuvers in the lumbar spine were not performed as the patient is very unsteady. CT CERVICAL MYELOGRAM FINDINGS: Alignment: Fused reversal of cervical lordosis. Vertebrae: C3-C7 ACDF. Solid arthrodesis seen at each level, best seen at C3-4 on coronal reformats. Ventral plate at B7-S2 without complicating feature. No superimposed bony erosion or fracture. Cord: No abnormal intrathecal filling defect or cord deformity. Extra-spinal: No significant finding Disc levels: C2-3: Degenerative facet spurring that is mild. Shallow central disc protrusion without neural compression. C3-4: ACDF with solid arthrodesis based on reformats. There is right paracentral residual ridging which mildly deforms the ventral cord. C4-5: ACDF with solid arthrodesis.  No bony impingement C5-6: ACDF with solid arthrodesis and no impingement C6-7: ACDF with solid arthrodesis and no impingement C7-T1:Degenerative facet spurring that is greater on the left where there is mild to moderate foraminal narrowing. Degenerative disc  narrowing. No cord compression. CT LUMBAR MYELOGRAM FINDINGS: Segmentation: 5 lumbar type vertebrae Alignment: Loss of lordosis Vertebrae: No evidence of fracture or bone lesion. Solid arthrodesis from L3-S1 Conus: Tip terminates at L1-2. There is prominent clumping of nerve roots at L3 and below, with poor filling of the thecal sac at L5 and S1, due to arachnoiditis. Presumably related saccular, patulous appearance of the thecal sac towards the right at L5-S1. Extra-spinal: Diffuse atherosclerosis. Disc levels: T12-  L1: Disc degeneration and facet spurring without cord impingement. L1-L2: Posterior element hypertrophy and mild disc narrowing with bulge. There is moderate narrowing of the thecal sac which was even more prominent on the myelography. Mild right foraminal narrowing from facet hypertrophy L2-L3: Facet osteoarthritis. Negative disc space. No neural impingement L3-L4: PLIF with solid arthrodesis and no impingement L4-L5: PLIF with solid arthrodesis and no impingement L5-S1:Solid arthrodesis with no visible impingement. CT THORACIC MYELOGRAM FINDINGS: Alignment: Negative for listhesis. Mildly exaggerated thoracic kyphosis. Vertebrae: No evidence of fracture or metastatic lesion. Cord: Normal shape and volume. Extra-spinal: Post treatment changes seen in the right lung. Multifocal atherosclerotic calcification. Spinal stimulator at T9-10, entering the inter spinous space at T10-11. Disc levels: Diffuse degenerative disc narrowing and endplate ridging. Mainly lower thoracic facet hypertrophy/spurring. No cord impingement. Facet spurring causes bilateral foraminal narrowing at T9-10 to T11-12. IMPRESSION: Cervical spine CT: C3-C7 ACDF with solid arthrodesis. Mild spinal stenosis and ventral cord deformity at C3-4 due to residual endplate ridging. Thoracic spine: 1. Diffuse spondylosis and disc degeneration without cord impingement. 2. Bilateral foraminal narrowing at T9-10 to T11-12. Lumbar spine: 1. Marked arachnoiditis at L3 and below with associated poor filling of the lower thecal sac. 2. L3-S1 solid arthrodesis. 3. L1-2 and L2-3 degenerative disease with moderate spinal stenosis at L1-2. Electronically Signed   By: Monte Fantasia M.D.   On: 04/15/2020 10:59   CT LUMBAR SPINE W CONTRAST  Result Date: 04/15/2020 CLINICAL DATA:  Neurogenic claudication.  Spinal stimulator. FLUOROSCOPY TIME:  2.9 minutes.  145.10 mGy PROCEDURE: LUMBAR PUNCTURE FOR CERVICAL LUMBAR AND THORACIC MYELOGRAM CERVICAL AND LUMBAR AND  THORACIC MYELOGRAM CT CERVICAL MYELOGRAM CT LUMBAR MYELOGRAM CT THORACIC MYELOGRAM Written and oral informed consent was obtained from the patient personally. Patient has Compazine on his med list but hasnt used it. There is history of severe reaction to prior myelography decades ago. Reportedly this was a reaction to an Isovue preservative. The patient completed his premedication regimen and recently was exposed to iodinated contrast for lower extremity angiogram, without adverse event. Patient was positioned prone on the fluoroscopy table. Local anesthesia was provided with 1% lidocaine without epinephrine after prepped and draped in the usual sterile fashion. Puncture was performed at L2-3 using a 5 inch 22-gauge spinal needle via midline approach. Using a single pass through the dura, the needle was placed within the thecal sac, with return of clear CSF. 10 mL Isovue M-300 was injected into the thecal sac, with normal opacification of the nerve roots and cauda equina consistent with free flow within the subarachnoid space. The patient was then moved to the trendelenburg position and contrast flowed into the Thoracic and Cervical spine regions. I personally performed the lumbar puncture and administered the intrathecal contrast. I also personally supervised acquisition of the myelogram images. TECHNIQUE: Contiguous axial images were obtained through the Cervical, Thoracic, and Lumbar spine after the intrathecal infusion of infusion. Coronal and sagittal reconstructions were obtained of the axial image sets. FINDINGS: Diagnostic myelography images were not obtained given patient's size ,  inability to stand, and dose considerations. There has been 2 level PLIF with presumed arachnoiditis at these levels. There is also adjacent segment degeneration with thecal sac narrowing. The patient's body habitus does not allow for typical table tilting and shoulder support such that contrast was advanced into the thoracic  spine in the right lateral decubitus position. There is no impediment to contrast passage through the thoracic spine. Detection of cervical canal opacification is difficult due to the patient's body habitus, but contrast was confidently seen reaching the cervical levels. Standing provocative maneuvers in the lumbar spine were not performed as the patient is very unsteady. CT CERVICAL MYELOGRAM FINDINGS: Alignment: Fused reversal of cervical lordosis. Vertebrae: C3-C7 ACDF. Solid arthrodesis seen at each level, best seen at C3-4 on coronal reformats. Ventral plate at E3-O1 without complicating feature. No superimposed bony erosion or fracture. Cord: No abnormal intrathecal filling defect or cord deformity. Extra-spinal: No significant finding Disc levels: C2-3: Degenerative facet spurring that is mild. Shallow central disc protrusion without neural compression. C3-4: ACDF with solid arthrodesis based on reformats. There is right paracentral residual ridging which mildly deforms the ventral cord. C4-5: ACDF with solid arthrodesis.  No bony impingement C5-6: ACDF with solid arthrodesis and no impingement C6-7: ACDF with solid arthrodesis and no impingement C7-T1:Degenerative facet spurring that is greater on the left where there is mild to moderate foraminal narrowing. Degenerative disc narrowing. No cord compression. CT LUMBAR MYELOGRAM FINDINGS: Segmentation: 5 lumbar type vertebrae Alignment: Loss of lordosis Vertebrae: No evidence of fracture or bone lesion. Solid arthrodesis from L3-S1 Conus: Tip terminates at L1-2. There is prominent clumping of nerve roots at L3 and below, with poor filling of the thecal sac at L5 and S1, due to arachnoiditis. Presumably related saccular, patulous appearance of the thecal sac towards the right at L5-S1. Extra-spinal: Diffuse atherosclerosis. Disc levels: T12- L1: Disc degeneration and facet spurring without cord impingement. L1-L2: Posterior element hypertrophy and mild disc  narrowing with bulge. There is moderate narrowing of the thecal sac which was even more prominent on the myelography. Mild right foraminal narrowing from facet hypertrophy L2-L3: Facet osteoarthritis. Negative disc space. No neural impingement L3-L4: PLIF with solid arthrodesis and no impingement L4-L5: PLIF with solid arthrodesis and no impingement L5-S1:Solid arthrodesis with no visible impingement. CT THORACIC MYELOGRAM FINDINGS: Alignment: Negative for listhesis. Mildly exaggerated thoracic kyphosis. Vertebrae: No evidence of fracture or metastatic lesion. Cord: Normal shape and volume. Extra-spinal: Post treatment changes seen in the right lung. Multifocal atherosclerotic calcification. Spinal stimulator at T9-10, entering the inter spinous space at T10-11. Disc levels: Diffuse degenerative disc narrowing and endplate ridging. Mainly lower thoracic facet hypertrophy/spurring. No cord impingement. Facet spurring causes bilateral foraminal narrowing at T9-10 to T11-12. IMPRESSION: Cervical spine CT: C3-C7 ACDF with solid arthrodesis. Mild spinal stenosis and ventral cord deformity at C3-4 due to residual endplate ridging. Thoracic spine: 1. Diffuse spondylosis and disc degeneration without cord impingement. 2. Bilateral foraminal narrowing at T9-10 to T11-12. Lumbar spine: 1. Marked arachnoiditis at L3 and below with associated poor filling of the lower thecal sac. 2. L3-S1 solid arthrodesis. 3. L1-2 and L2-3 degenerative disease with moderate spinal stenosis at L1-2. Electronically Signed   By: Monte Fantasia M.D.   On: 04/15/2020 10:59   DG MYELOGRAPHY LUMBAR INJ MULTI REGION  Result Date: 04/15/2020 CLINICAL DATA:  Neurogenic claudication.  Spinal stimulator. FLUOROSCOPY TIME:  2.9 minutes.  145.10 mGy PROCEDURE: LUMBAR PUNCTURE FOR CERVICAL LUMBAR AND THORACIC MYELOGRAM CERVICAL AND LUMBAR AND THORACIC MYELOGRAM  CT CERVICAL MYELOGRAM CT LUMBAR MYELOGRAM CT THORACIC MYELOGRAM Written and oral informed  consent was obtained from the patient personally. Patient has Compazine on his med list but hasnt used it. There is history of severe reaction to prior myelography decades ago. Reportedly this was a reaction to an Isovue preservative. The patient completed his premedication regimen and recently was exposed to iodinated contrast for lower extremity angiogram, without adverse event. Patient was positioned prone on the fluoroscopy table. Local anesthesia was provided with 1% lidocaine without epinephrine after prepped and draped in the usual sterile fashion. Puncture was performed at L2-3 using a 5 inch 22-gauge spinal needle via midline approach. Using a single pass through the dura, the needle was placed within the thecal sac, with return of clear CSF. 10 mL Isovue M-300 was injected into the thecal sac, with normal opacification of the nerve roots and cauda equina consistent with free flow within the subarachnoid space. The patient was then moved to the trendelenburg position and contrast flowed into the Thoracic and Cervical spine regions. I personally performed the lumbar puncture and administered the intrathecal contrast. I also personally supervised acquisition of the myelogram images. TECHNIQUE: Contiguous axial images were obtained through the Cervical, Thoracic, and Lumbar spine after the intrathecal infusion of infusion. Coronal and sagittal reconstructions were obtained of the axial image sets. FINDINGS: Diagnostic myelography images were not obtained given patient's size , inability to stand, and dose considerations. There has been 2 level PLIF with presumed arachnoiditis at these levels. There is also adjacent segment degeneration with thecal sac narrowing. The patient's body habitus does not allow for typical table tilting and shoulder support such that contrast was advanced into the thoracic spine in the right lateral decubitus position. There is no impediment to contrast passage through the thoracic  spine. Detection of cervical canal opacification is difficult due to the patient's body habitus, but contrast was confidently seen reaching the cervical levels. Standing provocative maneuvers in the lumbar spine were not performed as the patient is very unsteady. CT CERVICAL MYELOGRAM FINDINGS: Alignment: Fused reversal of cervical lordosis. Vertebrae: C3-C7 ACDF. Solid arthrodesis seen at each level, best seen at C3-4 on coronal reformats. Ventral plate at L5-B2 without complicating feature. No superimposed bony erosion or fracture. Cord: No abnormal intrathecal filling defect or cord deformity. Extra-spinal: No significant finding Disc levels: C2-3: Degenerative facet spurring that is mild. Shallow central disc protrusion without neural compression. C3-4: ACDF with solid arthrodesis based on reformats. There is right paracentral residual ridging which mildly deforms the ventral cord. C4-5: ACDF with solid arthrodesis.  No bony impingement C5-6: ACDF with solid arthrodesis and no impingement C6-7: ACDF with solid arthrodesis and no impingement C7-T1:Degenerative facet spurring that is greater on the left where there is mild to moderate foraminal narrowing. Degenerative disc narrowing. No cord compression. CT LUMBAR MYELOGRAM FINDINGS: Segmentation: 5 lumbar type vertebrae Alignment: Loss of lordosis Vertebrae: No evidence of fracture or bone lesion. Solid arthrodesis from L3-S1 Conus: Tip terminates at L1-2. There is prominent clumping of nerve roots at L3 and below, with poor filling of the thecal sac at L5 and S1, due to arachnoiditis. Presumably related saccular, patulous appearance of the thecal sac towards the right at L5-S1. Extra-spinal: Diffuse atherosclerosis. Disc levels: T12- L1: Disc degeneration and facet spurring without cord impingement. L1-L2: Posterior element hypertrophy and mild disc narrowing with bulge. There is moderate narrowing of the thecal sac which was even more prominent on the  myelography. Mild right foraminal narrowing from  facet hypertrophy L2-L3: Facet osteoarthritis. Negative disc space. No neural impingement L3-L4: PLIF with solid arthrodesis and no impingement L4-L5: PLIF with solid arthrodesis and no impingement L5-S1:Solid arthrodesis with no visible impingement. CT THORACIC MYELOGRAM FINDINGS: Alignment: Negative for listhesis. Mildly exaggerated thoracic kyphosis. Vertebrae: No evidence of fracture or metastatic lesion. Cord: Normal shape and volume. Extra-spinal: Post treatment changes seen in the right lung. Multifocal atherosclerotic calcification. Spinal stimulator at T9-10, entering the inter spinous space at T10-11. Disc levels: Diffuse degenerative disc narrowing and endplate ridging. Mainly lower thoracic facet hypertrophy/spurring. No cord impingement. Facet spurring causes bilateral foraminal narrowing at T9-10 to T11-12. IMPRESSION: Cervical spine CT: C3-C7 ACDF with solid arthrodesis. Mild spinal stenosis and ventral cord deformity at C3-4 due to residual endplate ridging. Thoracic spine: 1. Diffuse spondylosis and disc degeneration without cord impingement. 2. Bilateral foraminal narrowing at T9-10 to T11-12. Lumbar spine: 1. Marked arachnoiditis at L3 and below with associated poor filling of the lower thecal sac. 2. L3-S1 solid arthrodesis. 3. L1-2 and L2-3 degenerative disease with moderate spinal stenosis at L1-2. Electronically Signed   By: Monte Fantasia M.D.   On: 04/15/2020 10:59    ASSESSMENT AND PLAN: This is a very pleasant 64 years old white male with a stage IIIa non-small cell lung cancer, squamous cell carcinoma diagnosed in December 2019. The patient completed a course of treatment with concurrent chemoradiation with weekly carboplatin and paclitaxel status post 6 cycles.  He had partial response to this treatment. He was started on consolidation treatment with Imfinzi status post 26 cycles. The patient tolerated the previous course of his  treatment well with no concerning adverse effects. He is currently on observation and doing fine with no concerning issues except for the chronic back pain. The patient had repeat CT scan of the chest performed recently.  I personally and independently reviewed the scans and discussed the results with the patient today. His scan showed no concerning findings for disease progression.  There was some nodularity in the right thyroid gland.  I will order ultrasound of the thyroid for further evaluation of this area and if needed we will order fine-needle aspiration of any suspicious nodule. I recommended for the patient to continue on observation with repeat CT scan of the chest in 3 months. He was advised to call immediately if he has any other concerning symptoms in the interval. For the peripheral arterial disease, he is followed by cardiology and expected to have a surgical procedure in the next few weeks. For the chronic back pain he is currently under treatment with the [facility] pain clinic.  The patient voices understanding of current disease status and treatment options and is in agreement with the current care plan. All questions were answered. The patient knows to call the clinic with any problems, questions or concerns. We can certainly see the patient much sooner if necessary.  Disclaimer: This note was dictated with voice recognition software. Similar sounding words can inadvertently be transcribed and may not be corrected upon review.

## 2020-05-10 ENCOUNTER — Telehealth: Payer: Self-pay | Admitting: Medical Oncology

## 2020-05-10 ENCOUNTER — Telehealth: Payer: Self-pay | Admitting: Internal Medicine

## 2020-05-10 NOTE — Telephone Encounter (Signed)
Faxed prgress notes, tx flowsheet and ct scans to [facility].

## 2020-05-10 NOTE — Telephone Encounter (Signed)
Scheduled per los. Called and left msg. Mailed printout  °

## 2020-05-17 ENCOUNTER — Telehealth: Payer: Self-pay | Admitting: Medical Oncology

## 2020-05-17 NOTE — Telephone Encounter (Signed)
Cancer polices- Faxed 45 pages of med records, tx flow sheet and CT scans to [place] [facility] [place] [place] [facility] and [employer]

## 2020-07-11 ENCOUNTER — Telehealth: Payer: Self-pay | Admitting: Medical Oncology

## 2020-07-11 NOTE — Telephone Encounter (Signed)
Needs to change appt.

## 2020-07-12 ENCOUNTER — Telehealth: Payer: Self-pay | Admitting: Internal Medicine

## 2020-07-12 NOTE — Telephone Encounter (Signed)
Scheduled appt per 8/30 sch msg - pt wife is aware of appt change.

## 2020-07-14 ENCOUNTER — Other Ambulatory Visit: Payer: Self-pay

## 2020-07-14 ENCOUNTER — Ambulatory Visit (HOSPITAL_COMMUNITY)
Admission: RE | Admit: 2020-07-14 | Discharge: 2020-07-14 | Disposition: A | Discharge status: Home / Self Care | Payer: Medicare Other | Source: Ambulatory Visit | Attending: Internal Medicine | Admitting: Internal Medicine

## 2020-07-14 DIAGNOSIS — C3490 Malignant neoplasm of unspecified part of unspecified bronchus or lung: Secondary | ICD-10-CM | POA: Diagnosis not present

## 2020-07-19 ENCOUNTER — Other Ambulatory Visit: Payer: Self-pay | Admitting: Internal Medicine

## 2020-07-19 DIAGNOSIS — E041 Nontoxic single thyroid nodule: Secondary | ICD-10-CM

## 2020-07-28 ENCOUNTER — Other Ambulatory Visit (HOSPITAL_COMMUNITY)
Admission: RE | Admit: 2020-07-28 | Discharge: 2020-07-28 | Disposition: A | Discharge status: Home / Self Care | Payer: Medicare Other | Source: Ambulatory Visit | Attending: Radiology | Admitting: Radiology

## 2020-07-28 ENCOUNTER — Ambulatory Visit
Admission: RE | Admit: 2020-07-28 | Discharge: 2020-07-28 | Disposition: A | Discharge status: Home / Self Care | Payer: Medicare Other | Source: Ambulatory Visit | Attending: Internal Medicine | Admitting: Internal Medicine

## 2020-07-28 DIAGNOSIS — E041 Nontoxic single thyroid nodule: Secondary | ICD-10-CM | POA: Diagnosis present

## 2020-07-29 LAB — CYTOLOGY - NON PAP

## 2020-08-04 ENCOUNTER — Other Ambulatory Visit: Payer: Self-pay | Admitting: Physician Assistant

## 2020-08-04 ENCOUNTER — Ambulatory Visit (HOSPITAL_COMMUNITY)
Admission: RE | Admit: 2020-08-04 | Discharge: 2020-08-04 | Disposition: A | Discharge status: Home / Self Care | Payer: Medicare Other | Source: Ambulatory Visit | Attending: Internal Medicine | Admitting: Internal Medicine

## 2020-08-04 ENCOUNTER — Other Ambulatory Visit: Payer: Self-pay

## 2020-08-04 ENCOUNTER — Other Ambulatory Visit (HOSPITAL_COMMUNITY): Payer: Self-pay | Admitting: General Practice

## 2020-08-04 ENCOUNTER — Other Ambulatory Visit: Payer: Self-pay | Admitting: Internal Medicine

## 2020-08-04 DIAGNOSIS — C3490 Malignant neoplasm of unspecified part of unspecified bronchus or lung: Secondary | ICD-10-CM | POA: Insufficient documentation

## 2020-08-04 DIAGNOSIS — C3491 Malignant neoplasm of unspecified part of right bronchus or lung: Secondary | ICD-10-CM

## 2020-08-05 ENCOUNTER — Other Ambulatory Visit: Payer: Medicare Other

## 2020-08-08 ENCOUNTER — Inpatient Hospital Stay: Payer: Medicare Other | Attending: Internal Medicine | Admitting: Internal Medicine

## 2020-08-08 ENCOUNTER — Encounter: Payer: Self-pay | Admitting: Internal Medicine

## 2020-08-08 ENCOUNTER — Other Ambulatory Visit: Payer: Self-pay

## 2020-08-08 ENCOUNTER — Telehealth: Payer: Self-pay | Admitting: Internal Medicine

## 2020-08-08 ENCOUNTER — Inpatient Hospital Stay: Payer: Medicare Other

## 2020-08-08 ENCOUNTER — Ambulatory Visit: Payer: Medicare Other | Admitting: Internal Medicine

## 2020-08-08 VITALS — BP 128/72 | HR 87 | Temp 97.8°F | Resp 17 | Ht 73.0 in | Wt 325.1 lb

## 2020-08-08 DIAGNOSIS — C3491 Malignant neoplasm of unspecified part of right bronchus or lung: Secondary | ICD-10-CM | POA: Diagnosis not present

## 2020-08-08 DIAGNOSIS — G8929 Other chronic pain: Secondary | ICD-10-CM | POA: Insufficient documentation

## 2020-08-08 DIAGNOSIS — I739 Peripheral vascular disease, unspecified: Secondary | ICD-10-CM

## 2020-08-08 DIAGNOSIS — C3490 Malignant neoplasm of unspecified part of unspecified bronchus or lung: Secondary | ICD-10-CM

## 2020-08-08 DIAGNOSIS — Z9221 Personal history of antineoplastic chemotherapy: Secondary | ICD-10-CM | POA: Insufficient documentation

## 2020-08-08 DIAGNOSIS — C3411 Malignant neoplasm of upper lobe, right bronchus or lung: Secondary | ICD-10-CM | POA: Diagnosis present

## 2020-08-08 DIAGNOSIS — M545 Low back pain: Secondary | ICD-10-CM | POA: Insufficient documentation

## 2020-08-08 DIAGNOSIS — Z923 Personal history of irradiation: Secondary | ICD-10-CM | POA: Insufficient documentation

## 2020-08-08 LAB — CBC WITH DIFFERENTIAL (CANCER CENTER ONLY)
Abs Immature Granulocytes: 0.03 10*3/uL (ref 0.00–0.07)
Basophils Absolute: 0.1 10*3/uL (ref 0.0–0.1)
Basophils Relative: 1 %
Eosinophils Absolute: 0.4 10*3/uL (ref 0.0–0.5)
Eosinophils Relative: 6 %
HCT: 47 % (ref 39.0–52.0)
Hemoglobin: 14.7 g/dL (ref 13.0–17.0)
Immature Granulocytes: 0 %
Lymphocytes Relative: 12 %
Lymphs Abs: 0.9 10*3/uL (ref 0.7–4.0)
MCH: 29.2 pg (ref 26.0–34.0)
MCHC: 31.3 g/dL (ref 30.0–36.0)
MCV: 93.4 fL (ref 80.0–100.0)
Monocytes Absolute: 0.6 10*3/uL (ref 0.1–1.0)
Monocytes Relative: 8 %
Neutro Abs: 5.7 10*3/uL (ref 1.7–7.7)
Neutrophils Relative %: 73 %
Platelet Count: 243 10*3/uL (ref 150–400)
RBC: 5.03 MIL/uL (ref 4.22–5.81)
RDW: 14.5 % (ref 11.5–15.5)
WBC Count: 7.7 10*3/uL (ref 4.0–10.5)
nRBC: 0 % (ref 0.0–0.2)

## 2020-08-08 LAB — CMP (CANCER CENTER ONLY)
ALT: 16 U/L (ref 0–44)
AST: 16 U/L (ref 15–41)
Albumin: 3.6 g/dL (ref 3.5–5.0)
Alkaline Phosphatase: 108 U/L (ref 38–126)
Anion gap: 6 (ref 5–15)
BUN: 7 mg/dL — ABNORMAL LOW (ref 8–23)
CO2: 30 mmol/L (ref 22–32)
Calcium: 9.5 mg/dL (ref 8.9–10.3)
Chloride: 106 mmol/L (ref 98–111)
Creatinine: 0.84 mg/dL (ref 0.61–1.24)
GFR, Est AFR Am: >60 mL/min (ref >60)
GFR, Estimated: >60 mL/min (ref >60)
Glucose, Bld: 93 mg/dL (ref 70–99)
Potassium: 4.2 mmol/L (ref 3.5–5.1)
Sodium: 142 mmol/L (ref 135–145)
Total Bilirubin: 0.7 mg/dL (ref 0.3–1.2)
Total Protein: 7.2 g/dL (ref 6.5–8.1)

## 2020-08-08 NOTE — Progress Notes (Signed)
[place] Telephone:([phone]) [phone]   Fax:(336) [phone]  OFFICE PROGRESS NOTE  Celene Squibb, MD [address] [place] [address]  DIAGNOSIS: Stage IIIA (T2b, N2, M0) non-small cell lung cancer, squamous cell carcinoma presented with right upper lobe lung mass in addition to right paratracheal lymphadenopathy diagnosed in December 2019. The patient also has a hypermetabolic lesion in the left parotid gland that need further evaluation.  PRIOR THERAPY:   1) concurrent chemoradiation with weekly carboplatin for AUC of 2 and paclitaxel 45 mg/M2.  First dose started on 12/02/2018. Status post 6 cycles.  Last cycle was given on January 05, 2019 the patient is receiving radiation closer to home in [place], [place] completed January 12, 2019. 2) Consolidation treatment with immunotherapy with Imfinzi 10 mg/KG IV every 2 weeks.  First dose was given February 05, 2019. Status post 26 cycles.  CURRENT THERAPY:  Observation.  INTERVAL HISTORY: Alexander Hatfield. 64 y.o. male returns to the clinic today for follow-up visit.  The patient is feeling fine today with no concerning complaints except for the persistent low back pain and he is followed by neurosurgery as well as vascular surgery.  He denied having any current chest pain but has shortness of breath at baseline increased with exertion.  He has no cough or hemoptysis.  He does not play golf as he used to do in the past.  He denied having any nausea, vomiting, diarrhea or constipation.  He has no headache or visual changes.  He had repeat CT scan of the chest performed recently and is here for evaluation and discussion of his scan results.  MEDICAL HISTORY: Past Medical History:  Diagnosis Date   Arthritis    Chronic back pain    Chronic back pain    Headache    HOH (hard of hearing)    Hypertension    Lung mass    Pre-diabetes    Sleep apnea    wears cpap   Wears glasses    Wears partial dentures       ALLERGIES:  is allergic to bee venom and iohexol.  MEDICATIONS:  Current Outpatient Medications  Medication Sig Dispense Refill   amLODipine (NORVASC) 10 MG tablet Take 10 mg by mouth daily.      atorvastatin (LIPITOR) 40 MG tablet Take 40 mg by mouth at bedtime.     baclofen (LIORESAL) 10 MG tablet Take 10 mg by mouth 3 (three) times daily.     BREO ELLIPTA 200-25 MCG/INH AEPB Inhale 1 puff into the lungs daily as needed (asthma).      Calcium-Magnesium-Zinc (CAL-MAG-ZINC PO) Take 1 tablet by mouth daily.     diphenhydrAMINE (BENADRYL) 25 MG tablet Take 25 mg by mouth every 6 (six) hours as needed for allergies.      furosemide (LASIX) 20 MG tablet Take 20 mg by mouth daily.      ipratropium-albuterol (DUONEB) 0.5-2.5 (3) MG/3ML SOLN Inhale 3 mLs into the lungs every 4 (four) hours as needed (shortness of breath).      losartan (COZAAR) 100 MG tablet Take 100 mg by mouth daily.      meloxicam (MOBIC) 15 MG tablet Take 15 mg by mouth daily.      NARCAN 4 MG/0.1ML LIQD nasal spray kit Place 1 spray into the nose See admin instructions. Administer a single spray in one nostril upon signs of opioid overdose.  Call 911.  Repeat after 3  minutes if no response.     oxyCODONE (ROXICODONE) 15 MG immediate release tablet Take 15 mg by mouth 4 (four) times daily.      potassium chloride SA (K-DUR,KLOR-CON) 20 MEQ tablet Take 20 mEq by mouth every evening.      pregabalin (LYRICA) 150 MG capsule Take 1 capsule by mouth in the morning and at bedtime.     Umeclidinium-Vilanterol (ANORO ELLIPTA IN) Inhale 1 puff into the lungs daily as needed (SOB/ wheezing).      prochlorperazine (COMPAZINE) 10 MG tablet Take 1 tablet (10 mg total) by mouth every 6 (six) hours as needed for nausea or vomiting. (Patient not taking: Reported on 08/08/2020) 30 tablet 0   Current Facility-Administered Medications  Medication Dose Route Frequency Provider Last Rate Last Admin   sodium chloride flush (NS)  0.9 % injection 3 mL  3 mL Intravenous Q12H Lorretta Harp, MD        SURGICAL HISTORY:  Past Surgical History:  Procedure Laterality Date   ABDOMINAL AORTOGRAM W/LOWER EXTREMITY Right 02/18/2020   Procedure: ABDOMINAL AORTOGRAM W/LOWER EXTREMITY;  Surgeon: Lorretta Harp, MD;  Location: [facility];  Service: Cardiovascular;  Laterality: Right;   ANTERIOR CERVICAL DECOMP/DISCECTOMY FUSION N/A 10/27/2018   Procedure: ANTERIOR CERVICAL THREE-FOUR, FOUR-FIVE DECOMPRESSION/DISCECTOMY FUSION TWO LEVELS;  Surgeon: Kary Kos, MD;  Location: [facility];  Service: Neurosurgery;  Laterality: N/A;   ANTERIOR CERVICAL THREE-FOUR, FOUR-FIVE DECOMPRESSION/DISCECTOMY FUSION TWO LEVELS   BACK SURGERY     IR IMAGING GUIDED PORT INSERTION  11/27/2018   MULTIPLE TOOTH EXTRACTIONS     SPINAL CORD STIMULATOR IMPLANT     VIDEO BRONCHOSCOPY N/A 10/29/2018   Procedure: VIDEO BRONCHOSCOPY;  Surgeon: Laurin Coder, MD;  Location: [facility];  Service: Thoracic;  Laterality: N/A;   VIDEO BRONCHOSCOPY WITH ENDOBRONCHIAL ULTRASOUND N/A 10/27/2018   Procedure: VIDEO BRONCHOSCOPY WITH ENDOBRONCHIAL ULTRASOUND;  Surgeon: Laurin Coder, MD;  Location: [facility];  Service: Thoracic;  Laterality: N/A;   VIDEO BRONCHOSCOPY WITH ENDOBRONCHIAL ULTRASOUND N/A 10/29/2018   Procedure: VIDEO BRONCHOSCOPY WITH ENDOBRONCHIAL ULTRASOUND;  Surgeon: Laurin Coder, MD;  Location: [facility];  Service: Thoracic;  Laterality: N/A;    REVIEW OF SYSTEMS:  Constitutional: positive for fatigue Eyes: negative Ears, nose, mouth, throat, and face: negative Respiratory: positive for dyspnea on exertion Cardiovascular: negative Gastrointestinal: negative Genitourinary:negative Integument/breast: negative Hematologic/lymphatic: negative Musculoskeletal:positive for back pain Neurological: negative Behavioral/Psych: negative Endocrine: negative Allergic/Immunologic: negative   PHYSICAL EXAMINATION: General appearance:  alert, cooperative, fatigued and no distress Head: Normocephalic, without obvious abnormality, atraumatic Neck: no adenopathy, no JVD, supple, symmetrical, trachea midline and thyroid not enlarged, symmetric, no tenderness/mass/nodules Lymph nodes: Cervical, supraclavicular, and axillary nodes normal. Resp: wheezes bilaterally Back: symmetric, no curvature. ROM normal. No CVA tenderness. Cardio: regular rate and rhythm, S1, S2 normal, no murmur, click, rub or gallop GI: soft, non-tender; bowel sounds normal; no masses,  no organomegaly Extremities: extremities normal, atraumatic, no cyanosis or edema Neurologic: Alert and oriented X 3, normal strength and tone. Normal symmetric reflexes. Normal coordination and gait  ECOG PERFORMANCE STATUS: 1 - Symptomatic but completely ambulatory  Blood pressure 128/72, pulse 87, temperature 97.8 F (36.6 C), temperature source Tympanic, resp. rate 17, height 6' 1" (1.854 m), weight (!) 325 lb 1.6 oz (147.5 kg), SpO2 92 %.  LABORATORY DATA: Lab Results  Component Value Date   WBC 7.7 08/08/2020   HGB 14.7 08/08/2020   HCT 47.0 08/08/2020   MCV 93.4 08/08/2020   PLT  243 08/08/2020      Chemistry      Component Value Date/Time   NA 142 08/08/2020 0902   K 4.2 08/08/2020 0902   CL 106 08/08/2020 0902   CO2 30 08/08/2020 0902   BUN 7 (L) 08/08/2020 0902   CREATININE 0.84 08/08/2020 0902      Component Value Date/Time   CALCIUM 9.5 08/08/2020 0902   ALKPHOS 108 08/08/2020 0902   AST 16 08/08/2020 0902   ALT 16 08/08/2020 0902   BILITOT 0.7 08/08/2020 0902       RADIOGRAPHIC STUDIES: CT CHEST WO CONTRAST  Result Date: 08/05/2020 CLINICAL DATA:  Lung cancer.  Restaging.  IV contrast allergy. EXAM: CT CHEST WITHOUT CONTRAST TECHNIQUE: Multidetector CT imaging of the chest was performed following the standard protocol without IV contrast. COMPARISON:  04/29/2020 FINDINGS: Cardiovascular: The heart size is normal. No substantial pericardial  effusion. Coronary artery calcification is evident. Atherosclerotic calcification is noted in the wall of the thoracic aorta. Right Port-A-Cath tip is positioned at the SVC/RA junction. Mediastinum/Nodes: Subcarinal lymph node has progressed in the interval, now enlarged at 17 mm short axis (image 79/5) compared to 11 mm short axis previously. Right paratracheal node measured previously at 11 mm short axis is 12 mm short axis today (56/5). No evidence for gross hilar lymphadenopathy although assessment is limited by the lack of intravenous contrast on today's study. Similar appearance 2.6 cm right thyroid nodule. The esophagus has normal imaging features. There is no axillary lymphadenopathy. Lungs/Pleura: Suprahilar scarring in the right lung is similar with progressing platelike consolidative opacity in the retro hilar right lower lobe (compare axial 66/7 today to axial 66/4 previously). This progression is also demonstrated on sagittal image 67/9 today (compare sagittal 83/6 previously) with thickness on the current study measuring approximately 13 mm compared to 7 mm (remeasured) previously. Dominant portion of this changes most likely related to radiation scar, but local recurrence cannot be excluded given the interval progression. Interval development of central patchy/nodular areas of ground-glass opacity in the left upper lobe and both lower lobes. Calcified granuloma in the anterior right lower lobe identified previously is stable. New small posterior right pleural effusion. Upper Abdomen: The liver shows diffusely decreased attenuation suggesting fat deposition. Musculoskeletal: Thoracic spinal stimulator device again noted. No worrisome lytic or sclerotic osseous abnormality. IMPRESSION: 1. Interval development of central patchy/nodular areas of ground-glass opacity in the left upper lobe and both lower lobes. Imaging features likely related to infectious/inflammatory etiology. 2. Interval progression of  platelike consolidative opacity in the retro hilar right lower lobe associated with mild, but new subcarinal lymphadenopathy and a new tiny right pleural effusion. Imaging features are concerning for disease progression. PET-CT may prove helpful to further evaluate. 3. Hepatic steatosis. 4. Stable 2.6 cm right thyroid nodule. This has been evaluated on previous imaging. (ref: J Am Coll Radiol. 2015 Feb;12(2): 143-50). 5. Aortic Atherosclerosis (ICD10-I70.0). Electronically Signed   By: Misty Stanley M.D.   On: 08/05/2020 07:47   US Soft Tissue Head/Neck  Result Date: 07/16/2020 CLINICAL DATA:  Incidental on CT. EXAM: THYROID ULTRASOUND TECHNIQUE: Ultrasound examination of the thyroid gland and adjacent soft tissues was performed. COMPARISON:  CT scan of the chest 04/29/2020 FINDINGS: Parenchymal Echotexture: Mildly heterogenous Isthmus: 0.3 cm Right lobe: 6.5 x 3.5 x 3.0 cm Left lobe: 5.6 x 2.9 x 2.1 cm _________________________________________________________ Estimated total number of nodules >/= 1 cm: 2 Number of spongiform nodules >/=  2 cm not described below (TR1): 0 Number  of mixed cystic and solid nodules >/= 1.5 cm not described below ([place]): 0 _________________________________________________________ Nodule # 1: Location: Right; Inferior Maximum size: 3.9 cm; Other 2 dimensions: 3.6 x 2.8 cm Composition: solid/almost completely solid (2) Echogenicity: isoechoic (1) Shape: not taller-than-wide (0) Margins: ill-defined (0) Echogenic foci: none (0) ACR TI-RADS total points: 3. ACR TI-RADS risk category: TR3 (3 points). ACR TI-RADS recommendations: **Given size (>/= 2.5 cm) and appearance, fine needle aspiration of this mildly suspicious nodule should be considered based on TI-RADS criteria. _________________________________________________________ Nodule # 2: Location: Left; Inferior Maximum size: 1.4 cm; Other 2 dimensions: 1.3 x 1.1 cm Composition: solid/almost completely solid (2) Echogenicity: hypoechoic  (2) Shape: not taller-than-wide (0) Margins: ill-defined (0) Echogenic foci: none (0) ACR TI-RADS total points: 4. ACR TI-RADS risk category: TR4 (4-6 points). ACR TI-RADS recommendations: *Given size (>/= 0.5 - 0.9 cm) and appearance, a follow-up ultrasound in 1 year should be considered based on TI-RADS criteria. _________________________________________________________ IMPRESSION: 1. Approximately 3.9 cm TI-RADS category 3 nodule in the right inferior gland meets criteria to consider fine-needle aspiration biopsy. 2. An approximately 1.4 cm ill-defined TI-RADS category 4 nodule versus pseudo nodule in the left inferior gland meets criteria for surveillance. Recommend follow-up ultrasound in 1 year. The above is in keeping with the ACR TI-RADS recommendations - J Am Coll Radiol 2017;14:587-595. Electronically Signed   By: Jacqulynn Cadet M.D.   On: 07/16/2020 10:47   [place] FNA BX THYROID 1ST LESION AFIRMA  Result Date: 07/28/2020 INDICATION: Patient with history of lung cancer and recent CT showing incidental thyroid nodule. Follow-up thyroid ultrasound on 07/14/2020 revealed a 3.9 cm right inferior nodule which meets criteria for biopsy. He presents today for the procedure. EXAM: ULTRASOUND GUIDED FINE NEEDLE ASPIRATION BIOPSY OF RIGHT INFERIOR THYROID NODULE COMPARISON:  Thyroid ultrasound dated 07/14/2020 MEDICATIONS: 1% lidocaine to skin and subcutaneous tissue COMPLICATIONS: None immediate. TECHNIQUE: Informed written consent was obtained from the patient after a discussion of the risks, benefits and alternatives to treatment. Questions regarding the procedure were encouraged and answered. A timeout was performed prior to the initiation of the procedure. Pre-procedural ultrasound scanning demonstrated unchanged size and appearance of the indeterminate nodule within the right inferior thyroid lobe The procedure was planned. The neck was prepped in the usual sterile fashion, and a sterile drape was applied  covering the operative field. A timeout was performed prior to the initiation of the procedure. Local anesthesia was provided with 1% lidocaine. Under direct ultrasound guidance, 6 FNA biopsies were performed of the right inferior thyroid nodule with 25 gauge needles. Multiple ultrasound images were saved for procedural documentation purposes. The samples were prepared and submitted to pathology as well as for Afirma testing. Limited post procedural scanning was negative for hematoma or additional complication. Dressings were placed. The patient tolerated the above procedures procedure well without immediate postprocedural complication. FINDINGS: Nodule reference number based on prior diagnostic ultrasound: 1 Maximum size: 3.9 cm Location: Right; Inferior ACR TI-RADS risk category: TR3 (3 points) Reason for biopsy: meets ACR TI-RADS criteria Ultrasound imaging confirms appropriate placement of the needles within the thyroid nodule. IMPRESSION: Technically successful ultrasound guided fine needle aspiration biopsy of right inferior thyroid nodule. Final pathology pending. Read by: Rowe Robert, PA-C Electronically Signed   By: Sandi Mariscal M.D.   On: 07/28/2020 10:16    ASSESSMENT AND PLAN: This is a very pleasant 64 years old white male with a stage IIIa non-small cell lung cancer, squamous cell carcinoma diagnosed in December 2019. The  patient completed a course of treatment with concurrent chemoradiation with weekly carboplatin and paclitaxel status post 6 cycles.  He had partial response to this treatment. He was started on consolidation treatment with Imfinzi status post 26 cycles. The patient tolerated his previous treatment fairly well.  He is currently on observation with no concerning complaints except for the chronic back pain as well as shortness of breath at baseline increased with exertion. He had repeat CT scan of the chest performed recently.  I personally and independently reviewed the scan  images and discussed the results with the patient today. His scan showed interval development of central patchy nodular areas of groundglass opacity in the left upper lobe and both lower lobes as well as increased in the size of subcarinal lymphadenopathy suspicious for disease recurrence. I discussed the scan results with the patient and recommended for him to have a PET scan for further evaluation of his disease and to rule out any disease recurrence. For the peripheral arterial disease, he is followed by cardiology and expected to have a surgical procedure in the next few weeks. For the chronic back pain he is currently under treatment with the [facility] pain clinic. The patient will come back for follow-up visit in 2 weeks for evaluation. He was advised to call immediately if he has any concerning symptoms in the interval. The patient voices understanding of current disease status and treatment options and is in agreement with the current care plan. All questions were answered. The patient knows to call the clinic with any problems, questions or concerns. We can certainly see the patient much sooner if necessary.  Disclaimer: This note was dictated with voice recognition software. Similar sounding words can inadvertently be transcribed and may not be corrected upon review.

## 2020-08-08 NOTE — Telephone Encounter (Signed)
Scheduled appointment per 9/27 los. Gave patient updated calendar at check out.

## 2020-08-09 ENCOUNTER — Telehealth: Payer: Self-pay | Admitting: Internal Medicine

## 2020-08-09 NOTE — Telephone Encounter (Signed)
R/s 10/11 appt to accomodate PAL schedule for MD. Hulen Skains and spoke with patients wife. Confirmed new appt

## 2020-08-17 ENCOUNTER — Encounter (HOSPITAL_COMMUNITY): Payer: Self-pay

## 2020-08-18 ENCOUNTER — Ambulatory Visit (HOSPITAL_COMMUNITY)
Admission: RE | Admit: 2020-08-18 | Discharge: 2020-08-18 | Disposition: A | Discharge status: Home / Self Care | Payer: Medicare Other | Source: Ambulatory Visit | Attending: Internal Medicine | Admitting: Internal Medicine

## 2020-08-18 ENCOUNTER — Other Ambulatory Visit: Payer: Self-pay

## 2020-08-18 ENCOUNTER — Encounter (HOSPITAL_COMMUNITY): Admission: RE | Admit: 2020-08-18 | Payer: Medicare Other | Source: Ambulatory Visit

## 2020-08-18 DIAGNOSIS — J439 Emphysema, unspecified: Secondary | ICD-10-CM | POA: Insufficient documentation

## 2020-08-18 DIAGNOSIS — I700 Atherosclerosis of aorta: Secondary | ICD-10-CM | POA: Insufficient documentation

## 2020-08-18 DIAGNOSIS — I2510 Atherosclerotic heart disease of native coronary artery without angina pectoris: Secondary | ICD-10-CM | POA: Diagnosis not present

## 2020-08-18 DIAGNOSIS — C3490 Malignant neoplasm of unspecified part of unspecified bronchus or lung: Secondary | ICD-10-CM | POA: Diagnosis not present

## 2020-08-18 DIAGNOSIS — N62 Hypertrophy of breast: Secondary | ICD-10-CM | POA: Insufficient documentation

## 2020-08-18 LAB — GLUCOSE, CAPILLARY: Glucose-Capillary: 105 mg/dL — ABNORMAL HIGH (ref 70–99)

## 2020-08-18 MED ORDER — FLUDEOXYGLUCOSE F - 18 (FDG) INJECTION
15.8000 | Freq: Once | INTRAVENOUS | Status: AC | PRN
  Administered 2020-08-18: 15.8 via INTRAVENOUS

## 2020-08-22 ENCOUNTER — Ambulatory Visit: Payer: Medicare Other | Admitting: Internal Medicine

## 2020-08-29 ENCOUNTER — Inpatient Hospital Stay: Payer: Medicare Other | Attending: Internal Medicine | Admitting: Internal Medicine

## 2020-08-29 ENCOUNTER — Other Ambulatory Visit: Payer: Self-pay

## 2020-08-29 ENCOUNTER — Encounter: Payer: Self-pay | Admitting: Internal Medicine

## 2020-08-29 VITALS — BP 136/64 | HR 57 | Temp 98.9°F | Resp 18 | Ht 73.0 in | Wt 321.8 lb

## 2020-08-29 DIAGNOSIS — G8929 Other chronic pain: Secondary | ICD-10-CM | POA: Diagnosis not present

## 2020-08-29 DIAGNOSIS — M549 Dorsalgia, unspecified: Secondary | ICD-10-CM | POA: Diagnosis not present

## 2020-08-29 DIAGNOSIS — C3490 Malignant neoplasm of unspecified part of unspecified bronchus or lung: Secondary | ICD-10-CM

## 2020-08-29 DIAGNOSIS — C3491 Malignant neoplasm of unspecified part of right bronchus or lung: Secondary | ICD-10-CM | POA: Diagnosis not present

## 2020-08-29 DIAGNOSIS — I739 Peripheral vascular disease, unspecified: Secondary | ICD-10-CM | POA: Diagnosis not present

## 2020-08-29 DIAGNOSIS — C3411 Malignant neoplasm of upper lobe, right bronchus or lung: Secondary | ICD-10-CM | POA: Diagnosis present

## 2020-08-29 NOTE — Progress Notes (Signed)
[facility] [phone]   [phone]  OFFICE PROGRESS NOTE  Celene Squibb, MD [address] [place] [address]  DIAGNOSIS: Stage IIIA (T2b, N2, M0) non-small cell lung cancer, squamous cell carcinoma presented with right upper lobe lung mass in addition to right paratracheal lymphadenopathy diagnosed in December 2019. The patient also has a hypermetabolic lesion in the left parotid gland that need further evaluation.  PRIOR THERAPY:   1) concurrent chemoradiation with weekly carboplatin for AUC of 2 and paclitaxel 45 mg/M2.  First dose started on 12/02/2018. Status post 6 cycles.  Last cycle was given on January 05, 2019 the patient is receiving radiation closer to home in [place], [place] completed January 12, 2019. 2) Consolidation treatment with immunotherapy with Imfinzi 10 mg/KG IV every 2 weeks.  First dose was given February 05, 2019. Status post 26 cycles.  CURRENT THERAPY:  Observation.  INTERVAL HISTORY: Alexander Hatfield. 64 y.o. male returns to the clinic today for follow-up visit.  The patient is feeling fine today with no concerning complaints except for the baseline back pain and he is followed by neurosurgery and orthopedic surgery.  He denied having any current chest pain but has shortness of breath with exertion with no cough or hemoptysis.  The patient denied having any weight loss or night sweats.  He has no nausea, vomiting, diarrhea or constipation.  He denied having any headache or visual changes.  He was found on previous CT scan of the chest to have suspicious new subcarinal lymphadenopathy.  The patient had a PET scan performed recently and he is here for evaluation and discussion of his PET scan results.  MEDICAL HISTORY: Past Medical History:  Diagnosis Date  . Arthritis   . Chronic back pain   . Chronic back pain   . Headache   . HOH (hard of hearing)   . Hypertension   . Lung mass   . Pre-diabetes   . Sleep  apnea    wears cpap  . Wears glasses   . Wears partial dentures     ALLERGIES:  is allergic to bee venom and iohexol.  MEDICATIONS:  Current Outpatient Medications  Medication Sig Dispense Refill  . amLODipine (NORVASC) 10 MG tablet Take 10 mg by mouth daily.     Marland Kitchen atorvastatin (LIPITOR) 40 MG tablet Take 40 mg by mouth at bedtime.    . baclofen (LIORESAL) 10 MG tablet Take 10 mg by mouth 3 (three) times daily.    Marland Kitchen BREO ELLIPTA 200-25 MCG/INH AEPB Inhale 1 puff into the lungs daily as needed (asthma).     . buprenorphine-naloxone (SUBOXONE) 2-0.5 mg SUBL SL tablet Place 1 tablet under the tongue daily.    . Calcium-Magnesium-Zinc (CAL-MAG-ZINC PO) Take 1 tablet by mouth daily.    . diphenhydrAMINE (BENADRYL) 25 MG tablet Take 25 mg by mouth every 6 (six) hours as needed for allergies.     . furosemide (LASIX) 20 MG tablet Take 20 mg by mouth daily.     Marland Kitchen ipratropium-albuterol (DUONEB) 0.5-2.5 (3) MG/3ML SOLN Inhale 3 mLs into the lungs every 4 (four) hours as needed (shortness of breath).     . losartan (COZAAR) 100 MG tablet Take 100 mg by mouth daily.     . meloxicam (MOBIC) 15 MG tablet Take 15 mg by mouth daily.     Marland Kitchen NARCAN 4 MG/0.1ML LIQD nasal spray kit Place 1 spray into the nose  See admin instructions. Administer a single spray in one nostril upon signs of opioid overdose.  Call 911.  Repeat after 3 minutes if no response.    . oxyCODONE (ROXICODONE) 15 MG immediate release tablet Take 15 mg by mouth 4 (four) times daily.     . potassium chloride SA (K-DUR,KLOR-CON) 20 MEQ tablet Take 20 mEq by mouth every evening.     . pregabalin (LYRICA) 150 MG capsule Take 1 capsule by mouth in the morning and at bedtime.    . prochlorperazine (COMPAZINE) 10 MG tablet Take 1 tablet (10 mg total) by mouth every 6 (six) hours as needed for nausea or vomiting. (Patient not taking: Reported on 08/08/2020) 30 tablet 0  . Umeclidinium-Vilanterol (ANORO ELLIPTA IN) Inhale 1 puff into the lungs daily  as needed (SOB/ wheezing).      Current Facility-Administered Medications  Medication Dose Route Frequency Provider Last Rate Last Admin  . sodium chloride flush (NS) 0.9 % injection 3 mL  3 mL Intravenous Q12H Lorretta Harp, MD        SURGICAL HISTORY:  Past Surgical History:  Procedure Laterality Date  . ABDOMINAL AORTOGRAM W/LOWER EXTREMITY Right 02/18/2020   Procedure: ABDOMINAL AORTOGRAM W/LOWER EXTREMITY;  Surgeon: Lorretta Harp, MD;  Location: [facility];  Service: Cardiovascular;  Laterality: Right;  . ANTERIOR CERVICAL DECOMP/DISCECTOMY FUSION N/A 10/27/2018   Procedure: ANTERIOR CERVICAL THREE-FOUR, FOUR-FIVE DECOMPRESSION/DISCECTOMY FUSION TWO LEVELS;  Surgeon: Kary Kos, MD;  Location: [facility];  Service: Neurosurgery;  Laterality: N/A;   ANTERIOR CERVICAL THREE-FOUR, FOUR-FIVE DECOMPRESSION/DISCECTOMY FUSION TWO LEVELS  . BACK SURGERY    . IR IMAGING GUIDED PORT INSERTION  11/27/2018  . MULTIPLE TOOTH EXTRACTIONS    . SPINAL CORD STIMULATOR IMPLANT    . VIDEO BRONCHOSCOPY N/A 10/29/2018   Procedure: VIDEO BRONCHOSCOPY;  Surgeon: Laurin Coder, MD;  Location: [facility];  Service: Thoracic;  Laterality: N/A;  . VIDEO BRONCHOSCOPY WITH ENDOBRONCHIAL ULTRASOUND N/A 10/27/2018   Procedure: VIDEO BRONCHOSCOPY WITH ENDOBRONCHIAL ULTRASOUND;  Surgeon: Laurin Coder, MD;  Location: [facility];  Service: Thoracic;  Laterality: N/A;  . VIDEO BRONCHOSCOPY WITH ENDOBRONCHIAL ULTRASOUND N/A 10/29/2018   Procedure: VIDEO BRONCHOSCOPY WITH ENDOBRONCHIAL ULTRASOUND;  Surgeon: Laurin Coder, MD;  Location: [facility];  Service: Thoracic;  Laterality: N/A;    REVIEW OF SYSTEMS:  A comprehensive review of systems was negative except for: Constitutional: positive for fatigue Respiratory: positive for dyspnea on exertion Musculoskeletal: positive for back pain   PHYSICAL EXAMINATION: General appearance: alert, cooperative, fatigued and no distress Head: Normocephalic, without  obvious abnormality, atraumatic Neck: no adenopathy, no JVD, supple, symmetrical, trachea midline and thyroid not enlarged, symmetric, no tenderness/mass/nodules Lymph nodes: Cervical, supraclavicular, and axillary nodes normal. Resp: clear to auscultation bilaterally Back: symmetric, no curvature. ROM normal. No CVA tenderness. Cardio: regular rate and rhythm, S1, S2 normal, no murmur, click, rub or gallop GI: soft, non-tender; bowel sounds normal; no masses,  no organomegaly Extremities: extremities normal, atraumatic, no cyanosis or edema  ECOG PERFORMANCE STATUS: 1 - Symptomatic but completely ambulatory  Blood pressure 136/64, pulse (!) 57, temperature 98.9 F (37.2 C), temperature source Tympanic, resp. rate 18, height _0  (1.854 m), weight (!) 321 lb 12.8 oz (146 kg), SpO2 97 %.  LABORATORY DATA: Lab Results  Component Value Date   WBC 7.7 08/08/2020   HGB 14.7 08/08/2020   HCT 47.0 08/08/2020   MCV 93.4 08/08/2020   PLT 243 08/08/2020      Chemistry  Component Value Date/Time   NA 142 08/08/2020 0902   K 4.2 08/08/2020 0902   CL 106 08/08/2020 0902   CO2 30 08/08/2020 0902   BUN 7 (L) 08/08/2020 0902   CREATININE 0.84 08/08/2020 0902      Component Value Date/Time   CALCIUM 9.5 08/08/2020 0902   ALKPHOS 108 08/08/2020 0902   AST 16 08/08/2020 0902   ALT 16 08/08/2020 0902   BILITOT 0.7 08/08/2020 0902       RADIOGRAPHIC STUDIES: CT CHEST WO CONTRAST  Result Date: 08/05/2020 CLINICAL DATA:  Lung cancer.  Restaging.  IV contrast allergy. EXAM: CT CHEST WITHOUT CONTRAST TECHNIQUE: Multidetector CT imaging of the chest was performed following the standard protocol without IV contrast. COMPARISON:  04/29/2020 FINDINGS: Cardiovascular: The heart size is normal. No substantial pericardial effusion. Coronary artery calcification is evident. Atherosclerotic calcification is noted in the wall of the thoracic aorta. Right Port-A-Cath tip is positioned at the SVC/RA  junction. Mediastinum/Nodes: Subcarinal lymph node has progressed in the interval, now enlarged at 17 mm short axis (image 79/5) compared to 11 mm short axis previously. Right paratracheal node measured previously at 11 mm short axis is 12 mm short axis today (56/5). No evidence for gross hilar lymphadenopathy although assessment is limited by the lack of intravenous contrast on today's study. Similar appearance 2.6 cm right thyroid nodule. The esophagus has normal imaging features. There is no axillary lymphadenopathy. Lungs/Pleura: Suprahilar scarring in the right lung is similar with progressing platelike consolidative opacity in the retro hilar right lower lobe (compare axial 66/7 today to axial 66/4 previously). This progression is also demonstrated on sagittal image 67/9 today (compare sagittal 83/6 previously) with thickness on the current study measuring approximately 13 mm compared to 7 mm (remeasured) previously. Dominant portion of this changes most likely related to radiation scar, but local recurrence cannot be excluded given the interval progression. Interval development of central patchy/nodular areas of ground-glass opacity in the left upper lobe and both lower lobes. Calcified granuloma in the anterior right lower lobe identified previously is stable. New small posterior right pleural effusion. Upper Abdomen: The liver shows diffusely decreased attenuation suggesting fat deposition. Musculoskeletal: Thoracic spinal stimulator device again noted. No worrisome lytic or sclerotic osseous abnormality. IMPRESSION: 1. Interval development of central patchy/nodular areas of ground-glass opacity in the left upper lobe and both lower lobes. Imaging features likely related to infectious/inflammatory etiology. 2. Interval progression of platelike consolidative opacity in the retro hilar right lower lobe associated with mild, but new subcarinal lymphadenopathy and a new tiny right pleural effusion. Imaging  features are concerning for disease progression. PET-CT may prove helpful to further evaluate. 3. Hepatic steatosis. 4. Stable 2.6 cm right thyroid nodule. This has been evaluated on previous imaging. (ref: J Am Coll Radiol. 2015 Feb;12(2): 143-50). 5. Aortic Atherosclerosis (ICD10-I70.0). Electronically Signed   By: Misty Stanley M.D.   On: 08/05/2020 07:47   NM PET Image Restag (PS) Skull Base To Thigh  Result Date: 08/18/2020 CLINICAL DATA:  Subsequent treatment strategy for non-small-cell lung cancer diagnosed in 2019. Status post chemotherapy and radiation therapy. Immunotherapy. Recent CT demonstrating new subcarinal adenopathy and tiny right pleural effusion. EXAM: NUCLEAR MEDICINE PET SKULL BASE TO THIGH TECHNIQUE: 15.8 mCi F-18 FDG was injected intravenously. Full-ring PET imaging was performed from the skull base to thigh after the radiotracer. CT data was obtained and used for attenuation correction and anatomic localization. Fasting blood glucose: 105 mg/dl COMPARISON:  Multiple chest CTs, most recent 08/04/2020.  Prior PET of 10/24/2018 FINDINGS: Mediastinal blood pool activity: SUV max 2.9 Liver activity: SUV max NA NECK: Hypermetabolic nodule favored to be within the deep portion of the left parotid gland measures 9 mm and a S.U.V. max of 19.4 on 21/4. Similar in size and a S.U.V. max of 21.9 on the prior PET. No cervical nodal hypermetabolism. Incidental CT findings: No cervical adenopathy. Bilateral carotid atherosclerosis. CHEST: Hypermetabolism corresponding to presumed radiation induced right perihilar and suprahilar consolidation. Example at a S.U.V. max of 3.1 on 38/8. The node within the azygoesophageal recess of interest on the prior chest CT measures 1.2 cm and a S.U.V. max of 3.3 on 74/5. Decreased in size from 1.7 cm on the prior diagnostic CT. 1.1 cm and a S.U.V. max of 2.8 back on the PET of 10/24/2018. The right-sided pleural effusion has resolved since the prior diagnostic CT. No  pleural based hypermetabolism. Incidental CT findings: Deferred to recent diagnostic CT. Centrilobular emphysema. Right Port-A-Cath tip mid right atrium. Aortic and coronary artery atherosclerosis. Resolution of ground-glass opacities since 08/04/2020. ABDOMEN/PELVIS: No abdominopelvic parenchymal or nodal hypermetabolism. Incidental CT findings: Normal adrenal glands. Abdominal aortic atherosclerosis. SKELETON: Hypermetabolism superficial the left side of the coccyx measures a S.U.V. max of 4.6 including on approximately 186/4. Presumably posttraumatic. No suspicious osseous hypermetabolism. Incidental CT findings: Mild to moderate bilateral gynecomastia. Lumbar spine fixation. Dorsal spinal stimulator. Cervical spine fixation. IMPRESSION: 1. No findings to strongly suggest hypermetabolic residual or recurrent disease. 2. The mediastinal node of interest on 08/04/2020 CT is decreased in size and demonstrates relatively similar low-level hypermetabolism compared to 10/24/2018 PET. Favor this node having been reactive in the setting of either infection or fluid overload (given interval resolution of pleural fluid since that CT). 3. Incidental findings, including: Aortic atherosclerosis (ICD10-I70.0), coronary artery atherosclerosis and emphysema (ICD10-J43.9). Bilateral gynecomastia. 4. Left parotid hypermetabolic nodule is similar in size and hypermetabolism compared to 10/24/2018, favoring an indolent, likely benign lesion. Electronically Signed   By: Abigail Miyamoto M.D.   On: 08/18/2020 10:19    ASSESSMENT AND PLAN: This is a very pleasant 64 years old white male with a stage IIIa non-small cell lung cancer, squamous cell carcinoma diagnosed in December 2019. The patient completed a course of treatment with concurrent chemoradiation with weekly carboplatin and paclitaxel status post 6 cycles.  He had partial response to this treatment. He was started on consolidation treatment with Imfinzi status post 26  cycles. The patient tolerated his previous treatment fairly well.  He is currently on observation with no concerning complaints. His previous CT scan of the chest showed increased size of subcarinal lymphadenopathy suspicious for disease recurrence.  The patient underwent a PET scan recently.  I personally and independently reviewed the PET scan results and discussed that with the patient today. The PET scan showed no concerning findings for disease recurrence or metastasis. I recommended for the patient to continue on observation with repeat CT scan of the chest in 3 months. For the peripheral arterial disease, he is followed by cardiology and expected to have a surgical procedure in the next few weeks. For the chronic back pain he is currently under treatment with the [facility]. The patient was advised to call immediately if he has any concerning symptoms in the interval. The patient voices understanding of current disease status and treatment options and is in agreement with the current care plan. All questions were answered. The patient knows to call the clinic with any problems, questions or concerns.  We can certainly see the patient much sooner if necessary.  Disclaimer: This note was dictated with voice recognition software. Similar sounding words can inadvertently be transcribed and may not be corrected upon review.

## 2020-11-07 ENCOUNTER — Other Ambulatory Visit: Payer: Self-pay | Admitting: Medical Oncology

## 2020-11-07 ENCOUNTER — Telehealth: Payer: Self-pay | Admitting: Medical Oncology

## 2020-11-07 DIAGNOSIS — C3490 Malignant neoplasm of unspecified part of unspecified bronchus or lung: Secondary | ICD-10-CM

## 2020-11-07 NOTE — Telephone Encounter (Signed)
labs faxed to [facility] lab to be drawn around 11/25/2020

## 2020-11-09 ENCOUNTER — Telehealth: Payer: Self-pay

## 2020-11-09 NOTE — Telephone Encounter (Signed)
I confirmed CBC and CMP orders are in the system. I also called the [facility] lab and spoke with Lovena Le who confirmed she is able to see the orders in Epic.  I then called the pts wife back and advised as indicated. Pt does not need an appt as the hospital lab at [facility] is a walk in 8-4:30pm with luch from 11:30a to 12:10p. Pts wife was advised of this as well and expressed understanding of this information.

## 2020-11-09 NOTE — Telephone Encounter (Signed)
-----   Message from Tami Lin, RN sent at 11/03/2020  4:19 PM EST ----- Patient's wife called and said she got CT scheduled for 11/25/2020 at [facility] but she also wants labs drawn at [facility].  Lanelle Bal

## 2020-11-22 ENCOUNTER — Telehealth: Payer: Self-pay | Admitting: Medical Oncology

## 2020-11-22 NOTE — Telephone Encounter (Signed)
Requests to get port flush on the 1/17 Schedule message sent.

## 2020-11-25 ENCOUNTER — Other Ambulatory Visit: Payer: Medicare Other

## 2020-11-25 ENCOUNTER — Ambulatory Visit (HOSPITAL_COMMUNITY)
Admission: RE | Admit: 2020-11-25 | Discharge: 2020-11-25 | Disposition: A | Discharge status: Home / Self Care | Payer: Medicare Other | Source: Ambulatory Visit | Attending: Internal Medicine | Admitting: Internal Medicine

## 2020-11-25 ENCOUNTER — Other Ambulatory Visit: Payer: Self-pay

## 2020-11-25 ENCOUNTER — Encounter: Payer: Self-pay | Admitting: Internal Medicine

## 2020-11-25 DIAGNOSIS — C3490 Malignant neoplasm of unspecified part of unspecified bronchus or lung: Secondary | ICD-10-CM | POA: Insufficient documentation

## 2020-11-28 ENCOUNTER — Telehealth (HOSPITAL_BASED_OUTPATIENT_CLINIC_OR_DEPARTMENT_OTHER): Payer: Medicare Other | Admitting: Internal Medicine

## 2020-11-28 ENCOUNTER — Ambulatory Visit: Payer: Medicare Other | Admitting: Internal Medicine

## 2020-11-28 DIAGNOSIS — C3490 Malignant neoplasm of unspecified part of unspecified bronchus or lung: Secondary | ICD-10-CM

## 2020-11-28 DIAGNOSIS — C3491 Malignant neoplasm of unspecified part of right bronchus or lung: Secondary | ICD-10-CM | POA: Diagnosis not present

## 2020-11-28 NOTE — Progress Notes (Signed)
[facility] [phone]) [phone]   [phone]  PROGRESS NOTE FOR TELEMEDICINE VISITS  Celene Squibb, MD [address] [place] [address]  I connected with@ on 11/28/20 at  8:15 AM EST by video enabled telemedicine visit and verified that I am speaking with the correct person using two identifiers.   I discussed the limitations, risks, security and privacy concerns of performing an evaluation and management service by telemedicine and the availability of in-person appointments. I also discussed with the patient that there may be a patient responsible charge related to this service. The patient expressed understanding and agreed to proceed.  Other persons participating in the visit and their role in the encounter: Wife  Patient's location: Home Provider's location: [place]  DIAGNOSIS:Stage IIIA (T2b, N2, M0) non-small cell lung cancer, squamous cell carcinoma presented with right upper lobe lung mass in addition to right paratracheal lymphadenopathy diagnosed in December 2019. The patient also has a hypermetabolic lesion in the left parotid gland that need further evaluation.  PRIOR THERAPY:  1) concurrent chemoradiation with weekly carboplatin for AUC of 2 and paclitaxel 45 mg/M2.First dose started on 12/02/2018.Status post 6 cycles.  Last cycle was given on January 05, 2019 the patient is receiving radiation closer to home in [place], [place] completed January 12, 2019. 2) Consolidation treatment with immunotherapy with Imfinzi 10 mg/KG IV every 2 weeks.  First dose was given February 05, 2019. Status post 26 cycles.  CURRENT THERAPY: Observation.  INTERVAL HISTORY: Alexander Hatfield. 65 y.o. male has a visual mychart video visit with me today for evaluation and discussion of his scan results.  The patient is feeling fine today with no concerning complaints except for the persistent low back pain and peripheral neuropathy.  He  denied having any current chest pain, shortness of breath except with exertion and no cough or hemoptysis.  He denied having any fever or chills.  He has no nausea, vomiting, diarrhea or constipation.  He had repeat CT scan of the chest performed recently when we are having the visit for evaluation and discussion of his scan results.  MEDICAL HISTORY: Past Medical History:  Diagnosis Date  . Arthritis   . Chronic back pain   . Chronic back pain   . Headache   . HOH (hard of hearing)   . Hypertension   . Lung mass   . Pre-diabetes   . Sleep apnea    wears cpap  . Wears glasses   . Wears partial dentures     ALLERGIES:  is allergic to bee venom and iohexol.  MEDICATIONS:  Current Outpatient Medications  Medication Sig Dispense Refill  . amLODipine (NORVASC) 10 MG tablet Take 10 mg by mouth daily.     Marland Kitchen atorvastatin (LIPITOR) 40 MG tablet Take 40 mg by mouth at bedtime.    . baclofen (LIORESAL) 10 MG tablet Take 10 mg by mouth 3 (three) times daily.    Marland Kitchen BREO ELLIPTA 200-25 MCG/INH AEPB Inhale 1 puff into the lungs daily as needed (asthma).     . buprenorphine-naloxone (SUBOXONE) 2-0.5 mg SUBL SL tablet Place 1 tablet under the tongue daily.    . Calcium-Magnesium-Zinc (CAL-MAG-ZINC PO) Take 1 tablet by mouth daily.    . diphenhydrAMINE (BENADRYL) 25 MG tablet Take 25 mg by mouth every 6 (six) hours as needed for allergies.     . furosemide (LASIX) 20 MG tablet Take 20 mg by mouth daily.     Marland Kitchen  ipratropium-albuterol (DUONEB) 0.5-2.5 (3) MG/3ML SOLN Inhale 3 mLs into the lungs every 4 (four) hours as needed (shortness of breath).     . losartan (COZAAR) 100 MG tablet Take 100 mg by mouth daily.     . meloxicam (MOBIC) 15 MG tablet Take 15 mg by mouth daily.     Marland Kitchen NARCAN 4 MG/0.1ML LIQD nasal spray kit Place 1 spray into the nose See admin instructions. Administer a single spray in one nostril upon signs of opioid overdose.  Call 911.  Repeat after 3 minutes if no response.    .  oxyCODONE (ROXICODONE) 15 MG immediate release tablet Take 15 mg by mouth 4 (four) times daily.     . potassium chloride SA (K-DUR,KLOR-CON) 20 MEQ tablet Take 20 mEq by mouth every evening.     . pregabalin (LYRICA) 150 MG capsule Take 1 capsule by mouth in the morning and at bedtime.    . prochlorperazine (COMPAZINE) 10 MG tablet Take 1 tablet (10 mg total) by mouth every 6 (six) hours as needed for nausea or vomiting. (Patient not taking: Reported on 08/29/2020) 30 tablet 0  . Umeclidinium-Vilanterol (ANORO ELLIPTA IN) Inhale 1 puff into the lungs daily as needed (SOB/ wheezing).      Current Facility-Administered Medications  Medication Dose Route Frequency Provider Last Rate Last Admin  . sodium chloride flush (NS) 0.9 % injection 3 mL  3 mL Intravenous Q12H Lorretta Harp, MD        SURGICAL HISTORY:  Past Surgical History:  Procedure Laterality Date  . ABDOMINAL AORTOGRAM W/LOWER EXTREMITY Right 02/18/2020   Procedure: ABDOMINAL AORTOGRAM W/LOWER EXTREMITY;  Surgeon: Lorretta Harp, MD;  Location: [facility];  Service: Cardiovascular;  Laterality: Right;  . ANTERIOR CERVICAL DECOMP/DISCECTOMY FUSION N/A 10/27/2018   Procedure: ANTERIOR CERVICAL THREE-FOUR, FOUR-FIVE DECOMPRESSION/DISCECTOMY FUSION TWO LEVELS;  Surgeon: Kary Kos, MD;  Location: [facility];  Service: Neurosurgery;  Laterality: N/A;   ANTERIOR CERVICAL THREE-FOUR, FOUR-FIVE DECOMPRESSION/DISCECTOMY FUSION TWO LEVELS  . BACK SURGERY    . IR IMAGING GUIDED PORT INSERTION  11/27/2018  . MULTIPLE TOOTH EXTRACTIONS    . SPINAL CORD STIMULATOR IMPLANT    . VIDEO BRONCHOSCOPY N/A 10/29/2018   Procedure: VIDEO BRONCHOSCOPY;  Surgeon: Laurin Coder, MD;  Location: [facility];  Service: Thoracic;  Laterality: N/A;  . VIDEO BRONCHOSCOPY WITH ENDOBRONCHIAL ULTRASOUND N/A 10/27/2018   Procedure: VIDEO BRONCHOSCOPY WITH ENDOBRONCHIAL ULTRASOUND;  Surgeon: Laurin Coder, MD;  Location: [facility];  Service: Thoracic;   Laterality: N/A;  . VIDEO BRONCHOSCOPY WITH ENDOBRONCHIAL ULTRASOUND N/A 10/29/2018   Procedure: VIDEO BRONCHOSCOPY WITH ENDOBRONCHIAL ULTRASOUND;  Surgeon: Laurin Coder, MD;  Location: [facility];  Service: Thoracic;  Laterality: N/A;    REVIEW OF SYSTEMS:  A comprehensive review of systems was negative except for: Respiratory: positive for dyspnea on exertion Musculoskeletal: positive for back pain    LABORATORY DATA: Lab Results  Component Value Date   WBC 7.7 08/08/2020   HGB 14.7 08/08/2020   HCT 47.0 08/08/2020   MCV 93.4 08/08/2020   PLT 243 08/08/2020      Chemistry      Component Value Date/Time   NA 142 08/08/2020 0902   K 4.2 08/08/2020 0902   CL 106 08/08/2020 0902   CO2 30 08/08/2020 0902   BUN 7 (L) 08/08/2020 0902   CREATININE 0.84 08/08/2020 0902      Component Value Date/Time   CALCIUM 9.5 08/08/2020 0902   ALKPHOS 108 08/08/2020  0902   AST 16 08/08/2020 0902   ALT 16 08/08/2020 0902   BILITOT 0.7 08/08/2020 0902       RADIOGRAPHIC STUDIES: CT Chest Wo Contrast  Result Date: 11/25/2020 CLINICAL DATA:  Non-small cell lung cancer diagnosed December 2019 status post chemo radiation and immunotherapy. EXAM: CT CHEST WITHOUT CONTRAST TECHNIQUE: Multidetector CT imaging of the chest was performed following the standard protocol without IV contrast. COMPARISON:  PET-CT August 18, 2020 and chest CT August 04, 2020. FINDINGS: Cardiovascular: Normal size heart. No pericardial effusion. Aortic atherosclerosis. Coronary artery calcifications. Right chest wall Port-A-Cath with tip at the superior cavoatrial junction. Mediastinum/Nodes: In comparison to prior PET-CT there is similar size of the subcarinal lymph node which now measures 1.3 cm in short axis previously 1.2 cm (series 2, image 80). Unchanged size of the right paratracheal lymph node which measures 1.2 cm in short axis (series 2, image 56). No overt hilar adenopathy though evaluation is limited by lack of  intravenous contrast. Similar size of the 3.0 cm right thyroid nodule. Esophagus is unremarkable. Lungs/Pleura: Similar suprahilar plate like post treatment consolidative scarring in the right lung. Centrilobular and paraseptal emphysema. Unchanged size of solid nodule in the paramedian right middle lobe measuring 6 mm (series 4, image 94). Upper Abdomen: Similar splenic capsular calcifications. Musculoskeletal: Thoracic spinal stimulator again seen. No suspicious lytic or blastic lesions of bone. Similar appearance of bilateral gynecomastia. IMPRESSION: 1. Similar appearance of the post treatment consolidative scarring in the right lung. No new suspicious pulmonary nodules or masses. 2. Similar size of the index mediastinal lymph nodes. 3. Emphysema and aortic atherosclerosis. Aortic Atherosclerosis (ICD10-I70.0) and Emphysema (ICD10-J43.9). Electronically Signed   By: Dahlia Bailiff MD   On: 11/25/2020 19:06    ASSESSMENT AND PLAN: This is a very pleasant 65 years old white male with a stage IIIa non-small cell lung cancer, squamous cell carcinoma diagnosed in December 2019. The patient completed a course of treatment with concurrent chemoradiation with weekly carboplatin and paclitaxel status post 6 cycles.  He had partial response to this treatment. He was started on consolidation treatment with Imfinzi status post 26 cycles. The patient tolerated his previous treatment fairly well. The patient has been in observation since that time and he is feeling fine with no concerning complaints except for the persistent chronic back pain from degenerative disc disease as well as peripheral neuropathy. He had repeat CT scan of the chest performed recently.  I personally and independently reviewed the scans and discussed the results with the patient and his wife. His scan showed no concerning findings for disease progression. I recommended for the patient to continue on observation with repeat CT scan of the  chest in 4 months. I will also arrange for the patient to have Port-A-Cath flush every 2 months at [facility]. The patient was advised to call immediately if he has any concerning symptoms in the interval. I discussed the assessment and treatment plan with the patient. The patient was provided an opportunity to ask questions and all were answered. The patient agreed with the plan and demonstrated an understanding of the instructions.   The patient was advised to call back or seek an in-person evaluation if the symptoms worsen or if the condition fails to improve as anticipated.  I provided 20 minutes of face-to-face video visit time during this encounter, and > 50% was spent counseling as documented under my assessment & plan.  Eilleen Kempf, MD 11/28/2020 8:25 AM  Disclaimer:  This note was dictated with voice recognition software. Similar sounding words can inadvertently be transcribed and may not be corrected upon review.

## 2021-01-13 ENCOUNTER — Other Ambulatory Visit: Payer: Self-pay | Admitting: Physician Assistant

## 2021-01-16 ENCOUNTER — Telehealth: Payer: Self-pay | Admitting: Medical Oncology

## 2021-01-16 ENCOUNTER — Telehealth: Payer: Self-pay | Admitting: Physician Assistant

## 2021-01-16 NOTE — Telephone Encounter (Signed)
Scheduled appointment per 03/04 schedule message. Contacted patients wife, patient is aware.

## 2021-01-16 NOTE — Telephone Encounter (Signed)
I requested disability forms from [employer] and [employer].

## 2021-01-23 ENCOUNTER — Other Ambulatory Visit: Payer: Self-pay

## 2021-01-23 ENCOUNTER — Inpatient Hospital Stay (HOSPITAL_COMMUNITY): Payer: Medicare Other | Attending: Hematology

## 2021-01-23 ENCOUNTER — Encounter (HOSPITAL_COMMUNITY): Payer: Self-pay

## 2021-01-23 VITALS — BP 157/75 | HR 65 | Temp 97.1°F | Resp 18

## 2021-01-23 DIAGNOSIS — C3411 Malignant neoplasm of upper lobe, right bronchus or lung: Secondary | ICD-10-CM | POA: Insufficient documentation

## 2021-01-23 DIAGNOSIS — Z452 Encounter for adjustment and management of vascular access device: Secondary | ICD-10-CM | POA: Diagnosis present

## 2021-01-23 DIAGNOSIS — C3490 Malignant neoplasm of unspecified part of unspecified bronchus or lung: Secondary | ICD-10-CM

## 2021-01-23 MED ORDER — SODIUM CHLORIDE 0.9% FLUSH
10.0000 mL | Freq: Once | INTRAVENOUS | Status: AC
  Administered 2021-01-23: 10 mL via INTRAVENOUS

## 2021-01-23 MED ORDER — HEPARIN SOD (PORK) LOCK FLUSH 100 UNIT/ML IV SOLN
500.0000 [IU] | Freq: Once | INTRAVENOUS | Status: AC
  Administered 2021-01-23: 500 [IU] via INTRAVENOUS

## 2021-01-23 NOTE — Patient Instructions (Signed)
[facility]  Discharge Instructions:   _______________________________________________________________  Thank you for choosing [facility] to provide your oncology and hematology care.  To afford each patient quality time with our providers, please arrive at least 15 minutes before your scheduled appointment.  You need to re-schedule your appointment if you arrive 10 or more minutes late.  We strive to give you quality time with our providers, and arriving late affects you and other patients whose appointments are after yours.  Also, if you no show three or more times for appointments you may be dismissed from the clinic.  Again, thank you for choosing [facility] at [facility]. Our hope is that these requests will allow you access to exceptional care and in a timely manner. _______________________________________________________________  If you have questions after your visit, please contact our office at [phone] between the hours of 8:30 a.m. and 5:00 p.m. Voicemails left after 4:30 p.m. will not be returned until the following business day. _______________________________________________________________  For prescription refill requests, have your pharmacy contact our office. _______________________________________________________________  Recommendations made by the consultant and any test results will be sent to your referring physician. _______________________________________________________________ 

## 2021-01-23 NOTE — Progress Notes (Signed)
Patients port flushed without difficulty.  Good blood return noted with no bruising or swelling noted at site.  Band aid applied.  VSS with discharge and left in satisfactory condition with no s/s of distress noted.   

## 2021-01-24 ENCOUNTER — Telehealth: Payer: Self-pay | Admitting: Medical Oncology

## 2021-01-24 NOTE — Telephone Encounter (Signed)
[employer] [place] [employer] disability form -started working on it.

## 2021-03-20 ENCOUNTER — Other Ambulatory Visit: Payer: Self-pay

## 2021-03-20 ENCOUNTER — Inpatient Hospital Stay (HOSPITAL_COMMUNITY): Payer: Medicare Other | Attending: Hematology

## 2021-03-20 DIAGNOSIS — C3411 Malignant neoplasm of upper lobe, right bronchus or lung: Secondary | ICD-10-CM | POA: Diagnosis present

## 2021-03-20 DIAGNOSIS — Z87891 Personal history of nicotine dependence: Secondary | ICD-10-CM | POA: Diagnosis not present

## 2021-03-20 DIAGNOSIS — C3490 Malignant neoplasm of unspecified part of unspecified bronchus or lung: Secondary | ICD-10-CM

## 2021-03-20 LAB — COMPREHENSIVE METABOLIC PANEL
ALT: 19 U/L (ref 0–44)
AST: 19 U/L (ref 15–41)
Albumin: 4.2 g/dL (ref 3.5–5.0)
Alkaline Phosphatase: 94 U/L (ref 38–126)
Anion gap: 8 (ref 5–15)
BUN: 13 mg/dL (ref 8–23)
CO2: 29 mmol/L (ref 22–32)
Calcium: 9.2 mg/dL (ref 8.9–10.3)
Chloride: 101 mmol/L (ref 98–111)
Creatinine, Ser: 0.88 mg/dL (ref 0.61–1.24)
GFR, Estimated: >60 mL/min (ref >60)
Glucose, Bld: 102 mg/dL — ABNORMAL HIGH (ref 70–99)
Potassium: 3.9 mmol/L (ref 3.5–5.1)
Sodium: 138 mmol/L (ref 135–145)
Total Bilirubin: 0.9 mg/dL (ref 0.3–1.2)
Total Protein: 7.6 g/dL (ref 6.5–8.1)

## 2021-03-20 LAB — CBC WITH DIFFERENTIAL/PLATELET
Abs Immature Granulocytes: 0.03 10*3/uL (ref 0.00–0.07)
Basophils Absolute: 0 10*3/uL (ref 0.0–0.1)
Basophils Relative: 0 %
Eosinophils Absolute: 0.3 10*3/uL (ref 0.0–0.5)
Eosinophils Relative: 3 %
HCT: 47.5 % (ref 39.0–52.0)
Hemoglobin: 15.1 g/dL (ref 13.0–17.0)
Immature Granulocytes: 0 %
Lymphocytes Relative: 27 %
Lymphs Abs: 2.4 10*3/uL (ref 0.7–4.0)
MCH: 31 pg (ref 26.0–34.0)
MCHC: 31.8 g/dL (ref 30.0–36.0)
MCV: 97.5 fL (ref 80.0–100.0)
Monocytes Absolute: 0.7 10*3/uL (ref 0.1–1.0)
Monocytes Relative: 8 %
Neutro Abs: 5.5 10*3/uL (ref 1.7–7.7)
Neutrophils Relative %: 62 %
Platelets: 280 10*3/uL (ref 150–400)
RBC: 4.87 MIL/uL (ref 4.22–5.81)
RDW: 14.3 % (ref 11.5–15.5)
WBC: 8.9 10*3/uL (ref 4.0–10.5)
nRBC: 0 % (ref 0.0–0.2)

## 2021-03-20 MED ORDER — SODIUM CHLORIDE 0.9% FLUSH
10.0000 mL | INTRAVENOUS | Status: DC | PRN
  Administered 2021-03-20: 10 mL via INTRAVENOUS

## 2021-03-20 MED ORDER — HEPARIN SOD (PORK) LOCK FLUSH 100 UNIT/ML IV SOLN
500.0000 [IU] | Freq: Once | INTRAVENOUS | Status: AC
  Administered 2021-03-20: 500 [IU] via INTRAVENOUS

## 2021-03-20 NOTE — Progress Notes (Signed)
Patients port flushed without difficulty.  Good blood return noted with no bruising or swelling noted at site.  Band aid applied.  VSS with discharge and left in satisfactory condition with no s/s of distress noted.   

## 2021-03-23 ENCOUNTER — Ambulatory Visit (HOSPITAL_COMMUNITY)
Admission: RE | Admit: 2021-03-23 | Discharge: 2021-03-23 | Disposition: A | Discharge status: Home / Self Care | Payer: Medicare Other | Source: Ambulatory Visit | Attending: Internal Medicine | Admitting: Internal Medicine

## 2021-03-23 ENCOUNTER — Other Ambulatory Visit: Payer: Self-pay

## 2021-03-23 DIAGNOSIS — C3490 Malignant neoplasm of unspecified part of unspecified bronchus or lung: Secondary | ICD-10-CM | POA: Insufficient documentation

## 2021-03-24 NOTE — Progress Notes (Signed)
[place] HEMATOLOGY-ONCOLOGY TeleHEALTH VISIT PROGRESS NOTE   I connected with [name]. on 03/27/21 at  8:00 AM EDT by telephone and verified that I am speaking with the correct person using two identifiers.  I discussed the limitations, risks, security and privacy concerns of performing an evaluation and management service by telemedicine and the availability of in-person appointments. I also discussed with the patient that there may be a patient responsible charge related to this service. The patient expressed understanding and agreed to proceed.  Other persons participating in the visit and their role in the encounter: Dr. Julien Nordmann  Patient's location: Home  Provider's location: Office  Celene Squibb, MD [place] [address]   DIAGNOSIS: Stage IIIA(T2b, N2, M0) non-small cell lung cancer, squamous cell carcinoma presented with right upper lobe lung mass in addition to right paratracheal lymphadenopathy diagnosed in December 2019. The patient also has a hypermetabolic lesion in the left parotid gland that need further evaluation.  PRIOR THERAPY:  1) concurrent chemoradiation with weekly carboplatin for AUC of 2 and paclitaxel 45 mg/M2.First dose started on 12/02/2018.Status post 6 cycles.  Last cycle was given on January 05, 2019 the patient is receiving radiation closer to home in [place], [place] completed January 12, 2019. 2) Consolidation treatment with immunotherapy with Imfinzi 10 mg/KG IV every 2 weeks.  First dose was given February 05, 2019. Status post 26 cycles. Last dose given 01/20/20  CURRENT THERAPY: Observation   INTERVAL HISTORY: Nathaniel Wakeley. 65 y.o. male is a patient who Dr. Julien Nordmann and I connected with via a telephone visit. There was some technology issues with the mychart video visit. The patient is feeling well today without any concerning complaints except for he reports a "funny sound" in his throat when breathing for  the last few months. Unclear if this is wheezing or stidor, etc. He does not see a pulmonologist. Denies seasonal allergies. Denies dysphagia or odynophagia.  The patient has been on observation since March 2021. Denies any fever, chills, night sweats, or weight loss. Denies any chest pain or hemoptysis. He reports his baseline dyspnea on mild exertion which has been ongoing. Denies cigarette use. Denies any nausea, vomiting, diarrhea, or constipation. Denies any headache or visual changes. Denies any rashes or skin changes. The patient recently had a restaging CT scan performed. The patient is here today for evaluation and to review his scan results.    MEDICAL HISTORY: Past Medical History:  Diagnosis Date  . Arthritis   . Chronic back pain   . Chronic back pain   . Headache   . HOH (hard of hearing)   . Hypertension   . Lung mass   . Pre-diabetes   . Sleep apnea    wears cpap  . Wears glasses   . Wears partial dentures     ALLERGIES:  is allergic to bee venom and iohexol.  MEDICATIONS:  Current Outpatient Medications  Medication Sig Dispense Refill  . amLODipine (NORVASC) 10 MG tablet Take 10 mg by mouth daily.     Marland Kitchen atorvastatin (LIPITOR) 40 MG tablet Take 40 mg by mouth at bedtime.    . baclofen (LIORESAL) 10 MG tablet Take 10 mg by mouth 3 (three) times daily.    Marland Kitchen BREO ELLIPTA 200-25 MCG/INH AEPB Inhale 1 puff into the lungs daily as needed (asthma).     . buprenorphine-naloxone (SUBOXONE) 2-0.5 mg SUBL SL tablet Place 1 tablet under the  tongue daily.    . Calcium-Magnesium-Zinc (CAL-MAG-ZINC PO) Take 1 tablet by mouth daily.    . diphenhydrAMINE (BENADRYL) 25 MG tablet Take 25 mg by mouth every 6 (six) hours as needed for allergies.     . furosemide (LASIX) 20 MG tablet Take 20 mg by mouth daily.     Marland Kitchen ipratropium-albuterol (DUONEB) 0.5-2.5 (3) MG/3ML SOLN Inhale 3 mLs into the lungs every 4 (four) hours as needed (shortness of breath).     . losartan (COZAAR) 100 MG tablet  Take 100 mg by mouth daily.     . meloxicam (MOBIC) 15 MG tablet Take 15 mg by mouth daily.     Marland Kitchen NARCAN 4 MG/0.1ML LIQD nasal spray kit Place 1 spray into the nose See admin instructions. Administer a single spray in one nostril upon signs of opioid overdose.  Call 911.  Repeat after 3 minutes if no response.    . oxyCODONE (ROXICODONE) 15 MG immediate release tablet Take 15 mg by mouth 4 (four) times daily.     . potassium chloride SA (K-DUR,KLOR-CON) 20 MEQ tablet Take 20 mEq by mouth every evening.     . pregabalin (LYRICA) 150 MG capsule Take 1 capsule by mouth in the morning and at bedtime.    . prochlorperazine (COMPAZINE) 10 MG tablet Take 1 tablet (10 mg total) by mouth every 6 (six) hours as needed for nausea or vomiting. 30 tablet 0  . Umeclidinium-Vilanterol (ANORO ELLIPTA IN) Inhale 1 puff into the lungs daily as needed (SOB/ wheezing).      Current Facility-Administered Medications  Medication Dose Route Frequency Provider Last Rate Last Admin  . sodium chloride flush (NS) 0.9 % injection 3 mL  3 mL Intravenous Q12H Lorretta Harp, MD        SURGICAL HISTORY:  Past Surgical History:  Procedure Laterality Date  . ABDOMINAL AORTOGRAM W/LOWER EXTREMITY Right 02/18/2020   Procedure: ABDOMINAL AORTOGRAM W/LOWER EXTREMITY;  Surgeon: Lorretta Harp, MD;  Location: [facility];  Service: Cardiovascular;  Laterality: Right;  . ANTERIOR CERVICAL DECOMP/DISCECTOMY FUSION N/A 10/27/2018   Procedure: ANTERIOR CERVICAL THREE-FOUR, FOUR-FIVE DECOMPRESSION/DISCECTOMY FUSION TWO LEVELS;  Surgeon: Kary Kos, MD;  Location: [facility];  Service: Neurosurgery;  Laterality: N/A;   ANTERIOR CERVICAL THREE-FOUR, FOUR-FIVE DECOMPRESSION/DISCECTOMY FUSION TWO LEVELS  . BACK SURGERY    . IR IMAGING GUIDED PORT INSERTION  11/27/2018  . MULTIPLE TOOTH EXTRACTIONS    . SPINAL CORD STIMULATOR IMPLANT    . VIDEO BRONCHOSCOPY N/A 10/29/2018   Procedure: VIDEO BRONCHOSCOPY;  Surgeon: Laurin Coder,  MD;  Location: [facility];  Service: Thoracic;  Laterality: N/A;  . VIDEO BRONCHOSCOPY WITH ENDOBRONCHIAL ULTRASOUND N/A 10/27/2018   Procedure: VIDEO BRONCHOSCOPY WITH ENDOBRONCHIAL ULTRASOUND;  Surgeon: Laurin Coder, MD;  Location: [facility];  Service: Thoracic;  Laterality: N/A;  . VIDEO BRONCHOSCOPY WITH ENDOBRONCHIAL ULTRASOUND N/A 10/29/2018   Procedure: VIDEO BRONCHOSCOPY WITH ENDOBRONCHIAL ULTRASOUND;  Surgeon: Laurin Coder, MD;  Location: [facility];  Service: Thoracic;  Laterality: N/A;    REVIEW OF SYSTEMS:   Review of Systems  Constitutional: Negative for appetite change, chills, fatigue, fever and unexpected weight change.  HENT: Negative for mouth sores, nosebleeds, sore throat and trouble swallowing.   Eyes: Negative for eye problems and icterus.  Respiratory: Positive for abnormal sound with breathing. Stidor or wheezing? Positive for shortness of breath with exertion. Negative for cough, hemoptysis, and wheezing.   Cardiovascular: Negative for chest pain and leg swelling.  Gastrointestinal:  Negative for abdominal pain, constipation, diarrhea, nausea and vomiting.  Genitourinary: Negative for bladder incontinence, difficulty urinating, dysuria, frequency and hematuria.   Musculoskeletal: Positive for chronic back pain. Negative for gait problem, neck pain and neck stiffness.  Skin: Negative for itching and rash.  Neurological: Negative for dizziness, extremity weakness, gait problem, headaches, light-headedness and seizures.  Hematological: Negative for adenopathy. Does not bruise/bleed easily.  Psychiatric/Behavioral: Negative for confusion, depression and sleep disturbance. The patient is not nervous/anxious.     PHYSICAL EXAMINATION:  There were no vitals taken for this visit.  ECOG PERFORMANCE STATUS: 2 - Symptomatic, <50% confined to bed  Physical Exam  Unable to assess  LABORATORY DATA: Lab Results  Component Value Date   WBC 8.9 03/20/2021   HGB 15.1 03/20/2021    HCT 47.5 03/20/2021   MCV 97.5 03/20/2021   PLT 280 03/20/2021      Chemistry      Component Value Date/Time   NA 138 03/20/2021 1544   K 3.9 03/20/2021 1544   CL 101 03/20/2021 1544   CO2 29 03/20/2021 1544   BUN 13 03/20/2021 1544   CREATININE 0.88 03/20/2021 1544   CREATININE 0.84 08/08/2020 0902      Component Value Date/Time   CALCIUM 9.2 03/20/2021 1544   ALKPHOS 94 03/20/2021 1544   AST 19 03/20/2021 1544   AST 16 08/08/2020 0902   ALT 19 03/20/2021 1544   ALT 16 08/08/2020 0902   BILITOT 0.9 03/20/2021 1544   BILITOT 0.7 08/08/2020 0902       RADIOGRAPHIC STUDIES:  CT Chest Wo Contrast  Result Date: 03/24/2021 CLINICAL DATA:  Non-small cell lung cancer.  Restaging. EXAM: CT CHEST WITHOUT CONTRAST TECHNIQUE: Multidetector CT imaging of the chest was performed following the standard protocol without IV contrast. COMPARISON:  CT chest 08/04/2020 FINDINGS: Cardiovascular: Normal heart size. Aortic atherosclerosis. Mild aneurysmal dilatation of the ascending thoracic aorta measures 4.2 cm. Coronary artery calcifications identified. Mediastinum/Nodes: 2.8 cm right lobe of thyroid gland nodule is identified, image 10/2. This has been evaluated on previous imaging. (ref: J Am Coll Radiol. 2015 Feb;12(2): 143-50). The trachea appears patent and is midline. Normal appearance of the esophagus. No enlarged axillary or supraclavicular lymph nodes. Low right paratracheal lymph node is stable measuring 1 cm, image 55/2. Subcarinal lymph node measures 1 cm, image 75/2. Also unchanged. Lungs/Pleura: Mild to moderate centrilobular emphysema. No pleural effusion. Fibrosis and masslike architectural distortion within the perihilar right upper and lower lobes is again noted compatible with changes secondary to external beam radiation. Subpleural calcification within the posterior right upper lobe is unchanged. Scattered calcified granulomas noted bilaterally. No suspicious pulmonary nodule or  mass Upper Abdomen: No acute abnormality. Similar appearance of peripheral calcifications involving the spleen. Aortic atherosclerosis. Normal appearance of the adrenal glands. Musculoskeletal: Spondylosis identified within the thoracic spine. Dorsal column stimulator noted. IMPRESSION: 1. Stable CT of the chest. No specific findings identified to suggest residual or recurrence of tumor or metastatic disease. 2. Coronary artery calcifications noted. 3. 2.8 cm right lobe of thyroid gland nodule is identified. This has been evaluated on previous imaging. (ref: J Am Coll Radiol. 2015 Feb;12(2): 143-50). 4. Mild aneurysmal dilatation of the ascending thoracic aorta. Recommend annual imaging followup by CTA or MRA. This recommendation follows 2010 ACCF/AHA/AATS/ACR/ASA/SCA/SCAI/SIR/STS/SVM Guidelines for the Diagnosis and Management of Patients with Thoracic Aortic Disease. Circulation. 2010; 121: O878-M767. Aortic aneurysm NOS (ICD10-I71.9) Aortic Atherosclerosis (ICD10-I70.0) and Emphysema (ICD10-J43.9). Electronically Signed   By: Queen Slough.D.  On: 03/24/2021 10:12     ASSESSMENT/PLAN:  This isa very pleasant 65 year old Caucasian male with stage III non-small cell lung cancer, squamous cell carcinoma. He presented with a right upper lobe lung mass in addition to right paratracheal lymphadenopathy. He was diagnosed in December 2019.  The patient completed a course of concurrent chemoradiation with weekly carboplatin and paclitaxel. He is status post 6 cycles. Hehad a partial response to treatment.  He completed 26 cycles of consolidation immunotherapy with Imfinzi in March 2021.   He has been on observation since that time and feeling well.   The patient was seen with Dr. Julien Nordmann.  Dr. Julien Nordmann personally and independently reviewed his scan and discussed results with the patient today.  The scan showed no evidence for disease progression. The scan noted a previous thyroid nodule that was  biopsied and benign.  Dr. Julien Nordmann recommends that he continue on observation with a restaging CT scan of the chest and 6 months.   We will see him back for follow-up visit a few days after his next restaging CT scan and 6 months.  I will make port flush appointments at [facility] every 6 to 8 weeks.  The patient is reporting an abnormal sound with inspiration. Unable to assess via telephone visit. The patient lives out of town and it is challenging getting him to the clinic for evaluation. Therefore, we recommend he follow up with his PCP to evaluate him so he can be appropriately managed or referred to the correct specialist whether ENT vs. Pulmonology.    I discussed the assessment and treatment plan with the patient. The patient was provided an opportunity to ask questions and all were answered. The patient agreed with the plan and demonstrated an understanding of the instructions.  The patient was advised to call back or seek an in-person evaluation if the symptoms worsen or if the condition fails to improve as anticipated.  I provided 15 minutes of non face-to-face telephone visit time during this encounter, and > 50% was spent counseling as documented under my assessment & plan.  [place], PA-C 03/27/2021 8:31 AM  Orders Placed This Encounter  Procedures  . CT Chest Wo Contrast    PLEASE SCHEDULE AT [facility]    Standing Status:   Future    Standing Expiration Date:   03/27/2022    Scheduling Instructions:     PLEASE SCHEDULE AT [facility]    Order Specific Question:   Preferred imaging location?    Answer:   [facility]  . CBC with Differential ([facility] Only)    Standing Status:   Future    Standing Expiration Date:   03/27/2022  . CMP ([facility] only)    Standing Status:   Future    Standing Expiration Date:   03/27/2022     Tobe Sos Vayla Wilhelmi, PA-C 03/27/21  ADDENDUM: Hematology/oncology Attending: I had a face-to-face encounter  with the patient today.  I reviewed his record, scan and recommended his care plan.  This is a very pleasant 65 years old white male with history of stage IIIa non-small cell lung cancer, squamous cell carcinoma diagnosed in December 2019 status post a course of concurrent chemoradiation followed by 1 year of treatment with Imfinzi completed in March 2021.  He has been on observation since that time.  The patient is feeling fine except for occasional upper respiratory noise.  He also has a history of chronic low back pain. He had repeat CT  scan of the chest performed recently.  I personally and independently reviewed the scans and discussed the results with the patient today. His scan showed no concerning findings for disease progression. I recommended for him to continue on observation with repeat CT scan of the chest in 6 months. He will consult with his primary care physician regarding the upper respiratory noise and questionable seasonal allergy and sinusitis. He was advised to call immediately if he has any concerning symptoms in the interval. Disclaimer: This note was dictated with voice recognition software. Similar sounding words can inadvertently be transcribed and may be missed upon review. Eilleen Kempf, MD 03/27/21

## 2021-03-27 ENCOUNTER — Inpatient Hospital Stay: Payer: Medicare Other | Attending: Physician Assistant | Admitting: Physician Assistant

## 2021-03-27 ENCOUNTER — Other Ambulatory Visit: Payer: Self-pay

## 2021-03-27 DIAGNOSIS — C3491 Malignant neoplasm of unspecified part of right bronchus or lung: Secondary | ICD-10-CM

## 2021-10-13 ENCOUNTER — Telehealth: Payer: Self-pay

## 2021-10-13 NOTE — Telephone Encounter (Signed)
Pts wife is requesting pts follow-up appt be a video visit. She has been advised that pt has not been seen in the office in 2022, so this will need to be evaluated by the provider for approval.

## 2021-10-26 ENCOUNTER — Ambulatory Visit (HOSPITAL_COMMUNITY)
Admission: RE | Admit: 2021-10-26 | Discharge: 2021-10-26 | Disposition: A | Discharge status: Home / Self Care | Payer: Medicare Other | Source: Ambulatory Visit | Attending: Physician Assistant | Admitting: Physician Assistant

## 2021-10-26 ENCOUNTER — Inpatient Hospital Stay (HOSPITAL_COMMUNITY): Payer: Medicare Other | Attending: Hematology

## 2021-10-26 ENCOUNTER — Other Ambulatory Visit: Payer: Self-pay

## 2021-10-26 DIAGNOSIS — C3491 Malignant neoplasm of unspecified part of right bronchus or lung: Secondary | ICD-10-CM | POA: Insufficient documentation

## 2021-10-26 LAB — CBC WITH DIFFERENTIAL/PLATELET
Abs Immature Granulocytes: 0.04 10*3/uL (ref 0.00–0.07)
Basophils Absolute: 0.1 10*3/uL (ref 0.0–0.1)
Basophils Relative: 1 %
Eosinophils Absolute: 0.5 10*3/uL (ref 0.0–0.5)
Eosinophils Relative: 6 %
HCT: 46 % (ref 39.0–52.0)
Hemoglobin: 14.7 g/dL (ref 13.0–17.0)
Immature Granulocytes: 1 %
Lymphocytes Relative: 25 %
Lymphs Abs: 1.8 10*3/uL (ref 0.7–4.0)
MCH: 31.1 pg (ref 26.0–34.0)
MCHC: 32 g/dL (ref 30.0–36.0)
MCV: 97.3 fL (ref 80.0–100.0)
Monocytes Absolute: 0.8 10*3/uL (ref 0.1–1.0)
Monocytes Relative: 10 %
Neutro Abs: 4.1 10*3/uL (ref 1.7–7.7)
Neutrophils Relative %: 57 %
Platelets: 274 10*3/uL (ref 150–400)
RBC: 4.73 MIL/uL (ref 4.22–5.81)
RDW: 13.8 % (ref 11.5–15.5)
WBC: 7.2 10*3/uL (ref 4.0–10.5)
nRBC: 0 % (ref 0.0–0.2)

## 2021-10-26 LAB — COMPREHENSIVE METABOLIC PANEL
ALT: 16 U/L (ref 0–44)
AST: 17 U/L (ref 15–41)
Albumin: 3.6 g/dL (ref 3.5–5.0)
Alkaline Phosphatase: 90 U/L (ref 38–126)
Anion gap: 8 (ref 5–15)
BUN: 14 mg/dL (ref 8–23)
CO2: 27 mmol/L (ref 22–32)
Calcium: 8.3 mg/dL — ABNORMAL LOW (ref 8.9–10.3)
Chloride: 101 mmol/L (ref 98–111)
Creatinine, Ser: 0.95 mg/dL (ref 0.61–1.24)
GFR, Estimated: >60 mL/min (ref >60)
Glucose, Bld: 118 mg/dL — ABNORMAL HIGH (ref 70–99)
Potassium: 3.7 mmol/L (ref 3.5–5.1)
Sodium: 136 mmol/L (ref 135–145)
Total Bilirubin: 0.4 mg/dL (ref 0.3–1.2)
Total Protein: 6.7 g/dL (ref 6.5–8.1)

## 2021-10-26 MED ORDER — HEPARIN SOD (PORK) LOCK FLUSH 100 UNIT/ML IV SOLN
500.0000 [IU] | Freq: Once | INTRAVENOUS | Status: AC
  Administered 2021-10-26: 500 [IU] via INTRAVENOUS

## 2021-10-26 MED ORDER — SODIUM CHLORIDE 0.9% FLUSH
10.0000 mL | INTRAVENOUS | Status: DC | PRN
  Administered 2021-10-26: 10 mL via INTRAVENOUS

## 2021-10-27 ENCOUNTER — Other Ambulatory Visit (HOSPITAL_COMMUNITY): Payer: Medicare Other

## 2021-10-31 ENCOUNTER — Inpatient Hospital Stay: Payer: Medicare Other | Attending: Internal Medicine | Admitting: Internal Medicine

## 2021-10-31 ENCOUNTER — Other Ambulatory Visit: Payer: Self-pay

## 2021-10-31 VITALS — BP 136/79 | HR 93 | Temp 97.5°F | Resp 18

## 2021-10-31 DIAGNOSIS — G629 Polyneuropathy, unspecified: Secondary | ICD-10-CM | POA: Insufficient documentation

## 2021-10-31 DIAGNOSIS — M549 Dorsalgia, unspecified: Secondary | ICD-10-CM | POA: Diagnosis not present

## 2021-10-31 DIAGNOSIS — C3490 Malignant neoplasm of unspecified part of unspecified bronchus or lung: Secondary | ICD-10-CM

## 2021-10-31 DIAGNOSIS — G8929 Other chronic pain: Secondary | ICD-10-CM | POA: Diagnosis not present

## 2021-10-31 DIAGNOSIS — I739 Peripheral vascular disease, unspecified: Secondary | ICD-10-CM | POA: Insufficient documentation

## 2021-10-31 DIAGNOSIS — C3411 Malignant neoplasm of upper lobe, right bronchus or lung: Secondary | ICD-10-CM | POA: Diagnosis present

## 2021-10-31 DIAGNOSIS — Z87891 Personal history of nicotine dependence: Secondary | ICD-10-CM | POA: Diagnosis not present

## 2021-10-31 DIAGNOSIS — Z79899 Other long term (current) drug therapy: Secondary | ICD-10-CM | POA: Insufficient documentation

## 2021-10-31 DIAGNOSIS — I2510 Atherosclerotic heart disease of native coronary artery without angina pectoris: Secondary | ICD-10-CM | POA: Diagnosis not present

## 2021-10-31 NOTE — Progress Notes (Signed)
[facility] [phone]   [phone]  OFFICE PROGRESS NOTE  Celene Squibb, MD [address] [place] [address]  DIAGNOSIS: Stage IIIA (T2b, N2, M0) non-small cell lung cancer, squamous cell carcinoma presented with right upper lobe lung mass in addition to right paratracheal lymphadenopathy diagnosed in December 2019.  The patient also has a hypermetabolic lesion in the left parotid gland that need further evaluation.   PRIOR THERAPY:   1) concurrent chemoradiation with weekly carboplatin for AUC of 2 and paclitaxel 45 mg/M2.  First dose started on 12/02/2018. Status post 6 cycles.  Last cycle was given on January 05, 2019 the patient is receiving radiation closer to home in [place], [place] completed January 12, 2019. 2) Consolidation treatment with immunotherapy with Imfinzi 10 mg/KG IV every 2 weeks.  First dose was given February 05, 2019. Status post 26 cycles.   CURRENT THERAPY:  Observation.  INTERVAL HISTORY: Alexander Hatfield. 65 y.o. male returns to the clinic today for follow-up visit.  The patient is feeling fine today with no concerning complaints except for the neuropathy in the lower extremities as well as shortness of breath at baseline increased with exertion.  He denied having any current chest pain, cough or hemoptysis.  He has no nausea, vomiting, diarrhea or constipation.  He has no headache or visual changes.  He has no significant weight loss or night sweats.  He had repeat CT scan of the chest performed recently and he is here for evaluation and discussion of his scan results.  MEDICAL HISTORY: Past Medical History:  Diagnosis Date   Arthritis    Chronic back pain    Chronic back pain    Headache    HOH (hard of hearing)    Hypertension    Lung mass    Pre-diabetes    Sleep apnea    wears cpap   Wears glasses    Wears partial dentures     ALLERGIES:  is allergic to bee venom and iohexol.  MEDICATIONS:  Current  Outpatient Medications  Medication Sig Dispense Refill   amLODipine (NORVASC) 10 MG tablet Take 10 mg by mouth daily.      atorvastatin (LIPITOR) 40 MG tablet Take 40 mg by mouth at bedtime.     baclofen (LIORESAL) 10 MG tablet Take 10 mg by mouth 3 (three) times daily.     BREO ELLIPTA 200-25 MCG/INH AEPB Inhale 1 puff into the lungs daily as needed (asthma).      buprenorphine-naloxone (SUBOXONE) 2-0.5 mg SUBL SL tablet Place 1 tablet under the tongue daily.     Calcium-Magnesium-Zinc (CAL-MAG-ZINC PO) Take 1 tablet by mouth daily.     diphenhydrAMINE (BENADRYL) 25 MG tablet Take 25 mg by mouth every 6 (six) hours as needed for allergies.      furosemide (LASIX) 20 MG tablet Take 20 mg by mouth daily.      ipratropium-albuterol (DUONEB) 0.5-2.5 (3) MG/3ML SOLN Inhale 3 mLs into the lungs every 4 (four) hours as needed (shortness of breath).      losartan (COZAAR) 100 MG tablet Take 100 mg by mouth daily.      meloxicam (MOBIC) 15 MG tablet Take 15 mg by mouth daily.      NARCAN 4 MG/0.1ML LIQD nasal spray kit Place 1 spray into the nose See admin instructions. Administer a single spray in one nostril upon signs of opioid overdose.  Call 911.  Repeat  after 3 minutes if no response.     oxyCODONE (ROXICODONE) 15 MG immediate release tablet Take 15 mg by mouth 4 (four) times daily.      potassium chloride SA (K-DUR,KLOR-CON) 20 MEQ tablet Take 20 mEq by mouth every evening.      pregabalin (LYRICA) 150 MG capsule Take 1 capsule by mouth in the morning and at bedtime.     prochlorperazine (COMPAZINE) 10 MG tablet Take 1 tablet (10 mg total) by mouth every 6 (six) hours as needed for nausea or vomiting. 30 tablet 0   Umeclidinium-Vilanterol (ANORO ELLIPTA IN) Inhale 1 puff into the lungs daily as needed (SOB/ wheezing).      Current Facility-Administered Medications  Medication Dose Route Frequency Provider Last Rate Last Admin   sodium chloride flush (NS) 0.9 % injection 3 mL  3 mL Intravenous  Q12H Lorretta Harp, MD        SURGICAL HISTORY:  Past Surgical History:  Procedure Laterality Date   ABDOMINAL AORTOGRAM W/LOWER EXTREMITY Right 02/18/2020   Procedure: ABDOMINAL AORTOGRAM W/LOWER EXTREMITY;  Surgeon: Lorretta Harp, MD;  Location: [facility];  Service: Cardiovascular;  Laterality: Right;   ANTERIOR CERVICAL DECOMP/DISCECTOMY FUSION N/A 10/27/2018   Procedure: ANTERIOR CERVICAL THREE-FOUR, FOUR-FIVE DECOMPRESSION/DISCECTOMY FUSION TWO LEVELS;  Surgeon: Kary Kos, MD;  Location: [facility];  Service: Neurosurgery;  Laterality: N/A;   ANTERIOR CERVICAL THREE-FOUR, FOUR-FIVE DECOMPRESSION/DISCECTOMY FUSION TWO LEVELS   BACK SURGERY     IR IMAGING GUIDED PORT INSERTION  11/27/2018   MULTIPLE TOOTH EXTRACTIONS     SPINAL CORD STIMULATOR IMPLANT     VIDEO BRONCHOSCOPY N/A 10/29/2018   Procedure: VIDEO BRONCHOSCOPY;  Surgeon: Laurin Coder, MD;  Location: [facility];  Service: Thoracic;  Laterality: N/A;   VIDEO BRONCHOSCOPY WITH ENDOBRONCHIAL ULTRASOUND N/A 10/27/2018   Procedure: VIDEO BRONCHOSCOPY WITH ENDOBRONCHIAL ULTRASOUND;  Surgeon: Laurin Coder, MD;  Location: [facility];  Service: Thoracic;  Laterality: N/A;   VIDEO BRONCHOSCOPY WITH ENDOBRONCHIAL ULTRASOUND N/A 10/29/2018   Procedure: VIDEO BRONCHOSCOPY WITH ENDOBRONCHIAL ULTRASOUND;  Surgeon: Laurin Coder, MD;  Location: [facility];  Service: Thoracic;  Laterality: N/A;    REVIEW OF SYSTEMS:  A comprehensive review of systems was negative except for: Constitutional: positive for fatigue Respiratory: positive for dyspnea on exertion Musculoskeletal: positive for arthralgias and muscle weakness Neurological: positive for paresthesia   PHYSICAL EXAMINATION: General appearance: alert, cooperative, fatigued, and no distress Head: Normocephalic, without obvious abnormality, atraumatic Neck: no adenopathy, no JVD, supple, symmetrical, trachea midline, and thyroid not enlarged, symmetric, no  tenderness/mass/nodules Lymph nodes: Cervical, supraclavicular, and axillary nodes normal. Resp: clear to auscultation bilaterally Back: symmetric, no curvature. ROM normal. No CVA tenderness. Cardio: regular rate and rhythm, S1, S2 normal, no murmur, click, rub or gallop GI: soft, non-tender; bowel sounds normal; no masses,  no organomegaly Extremities: extremities normal, atraumatic, no cyanosis or edema  ECOG PERFORMANCE STATUS: 1 - Symptomatic but completely ambulatory  Blood pressure 136/79, pulse 93, temperature (!) 97.5 F (36.4 C), temperature source Tympanic, resp. rate 18, SpO2 93 %.  LABORATORY DATA: Lab Results  Component Value Date   WBC 7.2 10/26/2021   HGB 14.7 10/26/2021   HCT 46.0 10/26/2021   MCV 97.3 10/26/2021   PLT 274 10/26/2021      Chemistry      Component Value Date/Time   NA 136 10/26/2021 1513   K 3.7 10/26/2021 1513   CL 101 10/26/2021 1513   CO2 27 10/26/2021 1513  BUN 14 10/26/2021 1513   CREATININE 0.95 10/26/2021 1513   CREATININE 0.84 08/08/2020 0902      Component Value Date/Time   CALCIUM 8.3 (L) 10/26/2021 1513   ALKPHOS 90 10/26/2021 1513   AST 17 10/26/2021 1513   AST 16 08/08/2020 0902   ALT 16 10/26/2021 1513   ALT 16 08/08/2020 0902   BILITOT 0.4 10/26/2021 1513   BILITOT 0.7 08/08/2020 0902       RADIOGRAPHIC STUDIES: CT Chest Wo Contrast  Result Date: 10/29/2021 CLINICAL DATA:  Stage III right lung adenocarcinoma diagnosed in 2019 status post chemo radiation therapy and consolidation immunotherapy. Interval observation. Restaging. EXAM: CT CHEST WITHOUT CONTRAST TECHNIQUE: Multidetector CT imaging of the chest was performed following the standard protocol without IV contrast. COMPARISON:  03/23/2021 chest CT. FINDINGS: Cardiovascular: Normal heart size. No significant pericardial effusion/thickening. Three-vessel coronary atherosclerosis. Right internal jugular Port-A-Cath terminates at the cavoatrial junction.  Atherosclerotic thoracic aorta with dilated 4.4 cm ascending thoracic aorta, stable using similar measurement technique. Stable dilated main pulmonary artery (3.7 cm diameter). Mediastinum/Nodes: Stable heterogeneous hypodense 3.0 cm right thyroid nodule. This has been evaluated on previous imaging. (ref: J Am Coll Radiol. 2015 Feb;12(2): 143-50). Unremarkable esophagus. No axillary adenopathy. Mildly enlarged 1.0 cm right paratracheal lymph node (series 2/image 53), previously 1.0 cm, stable. Mildly enlarged 1.1 cm right subcarinal node (series 2/image 74), previously 1.0 cm, not substantially changed. No new pathologically enlarged mediastinal nodes. No discrete hilar adenopathy on these noncontrast images. Lungs/Pleura: No pneumothorax. No pleural effusion. Sharply marginated bandlike consolidation in the superior right perihilar region with associated mild bronchiectasis, volume loss and distortion, unchanged, compatible with radiation fibrosis. Scattered subcentimeter coarsely calcified right pulmonary granulomas, stable. No acute consolidative airspace disease or new significant pulmonary nodules. Upper abdomen: Mild diffuse hepatic steatosis. Stable coarse capsular calcifications in the periphery of the spleen. Musculoskeletal: No aggressive appearing focal osseous lesions. Mild to moderate symmetric bilateral gynecomastia, unchanged. Inferior approach left spinal stimulator lead seen terminating in the posterior lower thoracic spinal canal. Marked thoracic spondylosis. IMPRESSION: 1. 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 2. No evidence of recurrent metastatic disease in the chest. Mildly enlarged right subcarinal and right paratracheal lymph nodes are stable. 3. Stable dilated main pulmonary artery, suggesting chronic pulmonary arterial hypertension. 4. Stable dilated 4.4 cm ascending thoracic aorta. Recommend annual imaging followup by  CTA or MRA. This recommendation follows 2010 ACCF/AHA/AATS/ACR/ASA/SCA/SCAI/SIR/STS/SVM Guidelines for the Diagnosis and Management of Patients with Thoracic Aortic Disease. Circulation. 2010; 121: N629-B284. Aortic aneurysm NOS (ICD10-I71.9). 5. Three-vessel coronary atherosclerosis. 6. Mild diffuse hepatic steatosis. 7. Aortic Atherosclerosis (ICD10-I70.0). Electronically Signed   By: Ilona Sorrel M.D.   On: 10/29/2021 11:57     ASSESSMENT AND PLAN: This is a very pleasant 65 years old white male with stage IIIA non-small cell lung cancer, squamous cell carcinoma diagnosed in December 2019. The patient completed a course of treatment with concurrent chemoradiation with weekly carboplatin and paclitaxel status post 6 cycles.  He had partial response to this treatment. He was started on consolidation treatment with Imfinzi status post 26 cycles. The patient tolerated his previous treatment fairly well with no concerning adverse effects. He has been on observation since that time and feeling well except for the generalized fatigue and weakness in the lower extremity with paresthesia.  He also has shortness of breath at baseline increased with exertion likely secondary to pulmonary hypertension and coronary artery disease.  He  does not have any follow-up or evaluation by cardiology. He had repeat CT scan of the chest performed recently.  I personally and independently reviewed the scans and discussed the results with the patient today. His scan showed no concerning findings for disease recurrence. I recommended for the patient to continue on observation with repeat CT scan of the chest in 6 months. For the coronary artery disease seen on the scan and the persistent shortness of breath, I will refer the patient to cardiology in [place] for evaluation and management of his condition. For the peripheral arterial disease, he is followed by cardiology and expected to have a surgical procedure in the next few  weeks. For the chronic back pain he is currently under treatment with the [facility] pain clinic. For the peripheral neuropathy, he will try over-the-counter PEA. The patient was advised to call immediately if he has any other concerning symptoms in the interval. The patient voices understanding of current disease status and treatment options and is in agreement with the current care plan. All questions were answered. The patient knows to call the clinic with any problems, questions or concerns. We can certainly see the patient much sooner if necessary.  Disclaimer: This note was dictated with voice recognition software. Similar sounding words can inadvertently be transcribed and may not be corrected upon review.

## 2021-11-03 ENCOUNTER — Telehealth: Payer: Self-pay | Admitting: Internal Medicine

## 2021-11-03 NOTE — Telephone Encounter (Signed)
Sch per 12/20 los, pt wife aware

## 2021-11-20 NOTE — H&P (View-Only) (Signed)
Cardiology Office Note    Date:  11/29/2021   ID:  Alexander Costain., DOB Apr 20, 1956, MRN [id number]   PCP:  Celene Squibb, MD   [place]  Cardiologist:  Jenkins Rouge, MD   Advanced Practice Provider:  No care team member to display Electrophysiologist:  None   [id number]   Chief Complaint  Patient presents with   Shortness of Breath    History of Present Illness:  Alexander Knight. is a 66 y.o. male with history of hypertension, hyperlipidemia, severe obesity, PAD with 60% left external iliac artery on peripheral angiogram 02/18/2020.  Pain felt to be neuropathic and was seeing a neurosurgeon.  Also has stage IIIa non-small cell lung CA squamous cell carcinoma right upper lobe with mets to the left parotid.  Recent CT scan 10/26/21 showed three-vessel coronary atherosclerosis and a 4.4 cm ascending thoracic aorta needs yearly scanning and they wanted him to be seen. He complains of significant dyspnea just with standing and O2 drops to 81%. He saw pulmonary back in 2019 and wasn't able to due PFT's. He is frustrated to because he used to be so active. He's gained 15 lbs in the past month, 170 lbs in 12 yrs.  Wife sick and he can't cook. He has occasional right chest pain at rest-lasts a few minutes and goes away-like someone is pushing on him.     Past Medical History:  Diagnosis Date   Arthritis    Chronic back pain    Chronic back pain    Headache    HOH (hard of hearing)    Hypertension    Lung mass    Pre-diabetes    Sleep apnea    wears cpap   Wears glasses    Wears partial dentures     Past Surgical History:  Procedure Laterality Date   ABDOMINAL AORTOGRAM W/LOWER EXTREMITY Right 02/18/2020   Procedure: ABDOMINAL AORTOGRAM W/LOWER EXTREMITY;  Surgeon: Lorretta Harp, MD;  Location: [facility];  Service: Cardiovascular;  Laterality: Right;   ANTERIOR CERVICAL DECOMP/DISCECTOMY FUSION N/A 10/27/2018   Procedure: ANTERIOR CERVICAL  THREE-FOUR, FOUR-FIVE DECOMPRESSION/DISCECTOMY FUSION TWO LEVELS;  Surgeon: Kary Kos, MD;  Location: [facility];  Service: Neurosurgery;  Laterality: N/A;   ANTERIOR CERVICAL THREE-FOUR, FOUR-FIVE DECOMPRESSION/DISCECTOMY FUSION TWO LEVELS   BACK SURGERY     IR IMAGING GUIDED PORT INSERTION  11/27/2018   MULTIPLE TOOTH EXTRACTIONS     SPINAL CORD STIMULATOR IMPLANT     VIDEO BRONCHOSCOPY N/A 10/29/2018   Procedure: VIDEO BRONCHOSCOPY;  Surgeon: Laurin Coder, MD;  Location: [facility];  Service: Thoracic;  Laterality: N/A;   VIDEO BRONCHOSCOPY WITH ENDOBRONCHIAL ULTRASOUND N/A 10/27/2018   Procedure: VIDEO BRONCHOSCOPY WITH ENDOBRONCHIAL ULTRASOUND;  Surgeon: Laurin Coder, MD;  Location: [facility];  Service: Thoracic;  Laterality: N/A;   VIDEO BRONCHOSCOPY WITH ENDOBRONCHIAL ULTRASOUND N/A 10/29/2018   Procedure: VIDEO BRONCHOSCOPY WITH ENDOBRONCHIAL ULTRASOUND;  Surgeon: Laurin Coder, MD;  Location: [facility];  Service: Thoracic;  Laterality: N/A;    Current Medications: Current Meds  Medication Sig   amLODipine (NORVASC) 10 MG tablet Take 10 mg by mouth daily.    atorvastatin (LIPITOR) 40 MG tablet Take 40 mg by mouth at bedtime.   baclofen (LIORESAL) 10 MG tablet Take 10 mg by mouth 3 (three) times daily.   BREO ELLIPTA 200-25 MCG/INH AEPB Inhale 1 puff into the lungs daily as needed (asthma).    buprenorphine-naloxone (SUBOXONE) 2-0.5  mg SUBL SL tablet Place 1 tablet under the tongue daily.   Calcium-Magnesium-Zinc (CAL-MAG-ZINC PO) Take 1 tablet by mouth daily.   diphenhydrAMINE (BENADRYL) 25 MG tablet Take 25 mg by mouth every 6 (six) hours as needed for allergies.    furosemide (LASIX) 20 MG tablet Take 20 mg by mouth daily.    ipratropium-albuterol (DUONEB) 0.5-2.5 (3) MG/3ML SOLN Inhale 3 mLs into the lungs every 4 (four) hours as needed (shortness of breath).    losartan (COZAAR) 100 MG tablet Take 100 mg by mouth daily.    meloxicam (MOBIC) 15 MG tablet Take 15 mg by mouth daily.     NARCAN 4 MG/0.1ML LIQD nasal spray kit Place 1 spray into the nose See admin instructions. Administer a single spray in one nostril upon signs of opioid overdose.  Call 911.  Repeat after 3 minutes if no response.   oxyCODONE (ROXICODONE) 15 MG immediate release tablet Take 15 mg by mouth 4 (four) times daily.    potassium chloride SA (K-DUR,KLOR-CON) 20 MEQ tablet Take 20 mEq by mouth every evening.    prochlorperazine (COMPAZINE) 10 MG tablet Take 1 tablet (10 mg total) by mouth every 6 (six) hours as needed for nausea or vomiting.   Umeclidinium-Vilanterol (ANORO ELLIPTA IN) Inhale 1 puff into the lungs daily as needed (SOB/ wheezing).    Current Facility-Administered Medications for the 11/27/21 encounter (Office Visit) with Imogene Burn, PA-C  Medication   sodium chloride flush (NS) 0.9 % injection 3 mL     Allergies:   Bee venom and Iohexol   Social History   Socioeconomic History   Marital status: Married    Spouse name: Not on file   Number of children: Not on file   Years of education: Not on file   Highest education level: Not on file  Occupational History   Not on file  Tobacco Use   Smoking status: Former    Types: Cigarettes   Smokeless tobacco: Never   Tobacco comments:    Quit smoking cigarettes 15 years ago  Vaping Use   Vaping Use: Never used  Substance and Sexual Activity   Alcohol use: No   Drug use: No   Sexual activity: Not Currently  Other Topics Concern   Not on file  Social History Narrative   Not on file   Social Determinants of Health   Financial Resource Strain: Not on file  Food Insecurity: Not on file  Transportation Needs: Not on file  Physical Activity: Not on file  Stress: Not on file  Social Connections: Not on file     Family History:  The patient's  family history includes Lung disease in his father; Lupus in his mother.   ROS:   Please see the history of present illness.    ROS All other systems reviewed and are  negative.   PHYSICAL EXAM:   VS:  BP 115/81    Pulse (!) 120    Ht $R'6\' 1"'mV$  (1.854 m)    Wt (!) 349 lb (158.3 kg)    SpO2 94%    BMI 46.04 kg/m   Physical Exam  GEN: Obese, in no acute distress  Neck: no JVD, carotid bruits, or masses Cardiac:RRR; no murmurs, rubs, or gallops  Respiratory:  decrease breath sounds with scattered wheezing. GI: soft, nontender, nondistended, + BS Ext: without cyanosis, clubbing, or edema, Good distal pulses bilaterally Neuro:  Alert and Oriented x 3 Psych: euthymic mood, full affect  Wt Readings  from Last 3 Encounters:  11/27/21 (!) 349 lb (158.3 kg)  08/29/20 (!) 321 lb 12.8 oz (146 kg)  08/08/20 (!) 325 lb 1.6 oz (147.5 kg)      Studies/Labs Reviewed:   EKG:  EKG is  ordered today.  The ekg ordered today demonstrates sinus tachycardia 112/m otherwise normal  Recent Labs: 10/26/2021: ALT 16; BUN 14; Creatinine, Ser 0.95; Hemoglobin 14.7; Platelets 274; Potassium 3.7; Sodium 136   Lipid Panel No results found for: CHOL, TRIG, HDL, CHOLHDL, VLDL, LDLCALC, LDLDIRECT  Additional studies/ records that were reviewed today include:  CT of the chest 10/26/2021 IMPRESSION: 1. 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 2. No evidence of recurrent metastatic disease in the chest. Mildly enlarged right subcarinal and right paratracheal lymph nodes are stable. 3. Stable dilated main pulmonary artery, suggesting chronic pulmonary arterial hypertension. 4. Stable dilated 4.4 cm ascending thoracic aorta. Recommend annual imaging followup by CTA or MRA. This recommendation follows 2010 ACCF/AHA/AATS/ACR/ASA/SCA/SCAI/SIR/STS/SVM Guidelines for the Diagnosis and Management of Patients with Thoracic Aortic Disease. Circulation. 2010; 121: Y859-B965. Aortic aneurysm NOS (ICD10-I71.9). 5. Three-vessel coronary atherosclerosis. 6. Mild diffuse hepatic steatosis. 7. Aortic Atherosclerosis  (ICD10-I70.0).    FINDINGS: Cardiovascular: Normal heart size. No significant pericardial effusion/thickening. Three-vessel coronary atherosclerosis. Right internal jugular Port-A-Cath terminates at the cavoatrial junction. Atherosclerotic thoracic aorta with dilated 4.4 cm ascending thoracic aorta, stable using similar measurement technique. Stable dilated main pulmonary artery (3.7 cm diameter).     Risk Assessment/Calculations:         ASSESSMENT:    1. DOE (dyspnea on exertion)   2. Abnormal nuclear stress test   3. Essential hypertension   4. Hyperlipidemia, unspecified hyperlipidemia type   5. Thoracic aortic aneurysm without rupture, unspecified part   6. Coronary artery calcification seen on CT scan   7. Peripheral arterial disease (HCC)      PLAN:  In order of problems listed above:  DOE with decrease in O2 sats to low 80's just with standing, also sinus tachycardia. Recent CT coronary calcification, occasional rest chest pain, has gained 170 lbs in past 10 yrs, severely deconditioned, lung CA treated with chemo. Suspect much of this is pulmonary/obesity/deconditioning but many CV risk factors. Will order lexican myoview/echo(had chemo) and 2 week ZIO. Wheezing today but not using inhalers-I asked him to use these regularly or we can't do lexiscan. Will refer back to pulmonary.   Addendum: NST 11/29/21 mild to mod ischemia-see above for details. Dr. Eden Emms reviewed and recommends R/L heart cath to further assess. Discussed with patient and wife who are agreeable to proceed. Will check labs and schedule for next week.  I have reviewed the risks, indications, and alternatives to angioplasty and stenting with the patient. Risks include but are not limited to bleeding, infection, vascular injury, stroke, myocardial infection, arrhythmia, kidney injury, radiation-related injury in the case of prolonged fluoroscopy use, emergency cardiac surgery, and death. The patient  understands the risks of serious complication is low (<1%) and patient agrees to proceed.    Hypertension-well controlled  Hyperlipidemia on atorvastatin  4.4 cm ascending thoracic aorta needs yearly follow-up  Coronary calcification on CT 10/2021-contrast dye allergy so won't order CTA  PAD 60% left external iliac on angiogram 02/18/2020 followed by Dr. Allyson Sabal  Obesity-significant weight gain in the past 10 yrs. Needs to lose weight-wife is sick and he doesn't know how to cook. Eating all processed/frozen meals.  Shared Decision Making/Informed Consent  Shared Decision Making/Informed Consent The risks [chest pain, shortness of breath, cardiac arrhythmias, dizziness, blood pressure fluctuations, myocardial infarction, stroke/transient ischemic attack, nausea, vomiting, allergic reaction, radiation exposure, metallic taste sensation and life-threatening complications (estimated to be 1 in 10,000)], benefits (risk stratification, diagnosing coronary artery disease, treatment guidance) and alternatives of a nuclear stress test were discussed in detail with Mr. Disney and he agrees to proceed.    Medication Adjustments/Labs and Tests Ordered: Current medicines are reviewed at length with the patient today.  Concerns regarding medicines are outlined above.  Medication changes, Labs and Tests ordered today are listed in the Patient Instructions below. Patient Instructions  Medication Instructions:  Your physician recommends that you continue on your current medications as directed. Please refer to the Current Medication list given to you today.   Labwork: None today  Testing/Procedures: Your physician has requested that you have an echocardiogram. Echocardiography is a painless test that uses sound waves to create images of your heart. It provides your doctor with information about the size and shape of your heart and how well your hearts chambers and valves are working. This procedure takes  approximately one hour. There are no restrictions for this procedure.  Your physician has requested that you have a lexiscan myoview. For further information please visit [email or web address]. Please follow instruction sheet, as given.     ZIO XT- Long Term Monitor Instructions   Your physician has requested you wear your ZIO patch monitor____14___days.   This is a single patch monitor.  [employer] supplies one patch monitor per enrollment.  Additional stickers are not available.   Please do not apply patch if you will be having a Nuclear Stress Test, Echocardiogram, Cardiac CT, MRI, or Chest Xray during the time frame you would be wearing the monitor. The patch cannot be worn during these tests.  You cannot remove and re-apply the ZIO XT patch monitor.   Your ZIO patch monitor will be sent USPS Priority mail from [facility] directly to your home address. The monitor may also be mailed to a PO BOX if home delivery is not available.   It may take 3-5 days to receive your monitor after you have been enrolled.   Once you have received you monitor, please review enclosed instructions.  Your monitor has already been registered assigning a specific monitor serial # to you.   Applying the monitor   Shave hair from upper left chest.   Hold abrader disc by orange tab.  Rub abrader in 40 strokes over left upper chest as indicated in your monitor instructions.   Clean area with 4 enclosed alcohol pads .  Use all pads to assure are is cleaned thoroughly.  Let dry.   Apply patch as indicated in monitor instructions.  Patch will be place under collarbone on left side of chest with arrow pointing upward.   Rub patch adhesive wings for 2 minutes.Remove white label marked "1".  Remove white label marked "2".  Rub patch adhesive wings for 2 additional minutes.   While looking in a mirror, press and release button in center of patch.  A small green light will flash 3-4 times .  This will be your  only indicator the monitor has been turned on.     Do not shower for the first 24 hours.  You may shower after the first 24 hours.   Press button if you feel a symptom. You will hear a small click.  Record Date, Time and Symptom in the  Patient Log Book.   When you are ready to remove patch, follow instructions on last 2 pages of Patient Log Book.  Stick patch monitor onto last page of Patient Log Book.   Place Patient Log Book in [place] box.  Use locking tab on box and tape box closed securely.  The [employer] and [employer] has [employer] on it.  Please place in mailbox as soon as possible.  Your physician should have your test results approximately 7 days after the monitor has been mailed back to [facility].   Call [place] at [phone] if you have questions regarding your ZIO XT patch monitor.  Call them immediately if you see an orange light blinking on your monitor.   If your monitor falls off in less than 4 days contact our Monitor department at [phone].  If your monitor becomes loose or falls off after 4 days call [employer] at [phone] for suggestions on securing your monitor.    Follow-Up: Dr.Nishan after tests completed  Any Other Special Instructions Will Be Listed Below (If Applicable).     Two Gram Sodium Diet 2000 mg  What is Sodium? Sodium is a mineral found naturally in many foods. The most significant source of sodium in the diet is table salt, which is about 40% sodium.  Processed, convenience, and preserved foods also contain a large amount of sodium.  The body needs only 500 mg of sodium daily to function,  A normal diet provides more than enough sodium even if you do not use salt.  Why Limit Sodium? A build up of sodium in the body can cause thirst, increased blood pressure, shortness of breath, and water retention.  Decreasing sodium in the diet can reduce edema and risk of heart attack or stroke associated with high blood pressure.   Keep in mind that there are many other factors involved in these health problems.  Heredity, obesity, lack of exercise, cigarette smoking, stress and what you eat all play a role.  General Guidelines: Do not add salt at the table or in cooking.  One teaspoon of salt contains over 2 grams of sodium. Read food labels Avoid processed and convenience foods Ask your dietitian before eating any foods not dicussed in the menu planning guidelines Consult your physician if you wish to use a salt substitute or a sodium containing medication such as antacids.  Limit milk and milk products to 16 oz (2 cups) per day.  Shopping Hints: READ LABELS!! "Dietetic" does not necessarily mean low sodium. Salt and other sodium ingredients are often added to foods during processing.    Menu Planning Guidelines Food Group Choose More Often Avoid  Beverages (see also the milk group All fruit juices, low-sodium, salt-free vegetables juices, low-sodium carbonated beverages Regular vegetable or tomato juices, commercially softened water used for drinking or cooking  Breads and Cereals Enriched white, wheat, rye and pumpernickel bread, hard rolls and dinner rolls; muffins, cornbread and waffles; most dry cereals, cooked cereal without added salt; unsalted crackers and breadsticks; low sodium or homemade bread crumbs Bread, rolls and crackers with salted tops; quick breads; instant hot cereals; pancakes; commercial bread stuffing; self-rising flower and biscuit mixes; regular bread crumbs or cracker crumbs  Desserts and Sweets Desserts and sweets mad with mild should be within allowance Instant pudding mixes and cake mixes  Fats Butter or margarine; vegetable oils; unsalted salad dressings, regular salad dressings limited to 1 Tbs; light, sour and heavy cream Regular salad dressings containing bacon  fat, bacon bits, and salt pork; snack dips made with instant soup mixes or processed cheese; salted nuts  Fruits Most fresh,  frozen and canned fruits Fruits processed with salt or sodium-containing ingredient (some dried fruits are processed with sodium sulfites        Vegetables Fresh, frozen vegetables and low- sodium canned vegetables Regular canned vegetables, sauerkraut, pickled vegetables, and others prepared in brine; frozen vegetables in sauces; vegetables seasoned with ham, bacon or salt pork  Condiments, Sauces, Miscellaneous  Salt substitute with physician's approval; pepper, herbs, spices; vinegar, lemon or lime juice; hot pepper sauce; garlic powder, onion powder, low sodium soy sauce (1 Tbs.); low sodium condiments (ketchup, chili sauce, mustard) in limited amounts (1 tsp.) fresh ground horseradish; unsalted tortilla chips, pretzels, potato chips, popcorn, salsa (1/4 cup) Any seasoning made with salt including garlic salt, celery salt, onion salt, and seasoned salt; sea salt, rock salt, kosher salt; meat tenderizers; monosodium glutamate; mustard, regular soy sauce, barbecue, sauce, chili sauce, teriyaki sauce, steak sauce, Worcestershire sauce, and most flavored vinegars; canned gravy and mixes; regular condiments; salted snack foods, olives, picles, relish, horseradish sauce, catsup   Food preparation: Try these seasonings Meats:    Pork Sage, onion Serve with applesauce  Chicken Poultry seasoning, thyme, parsley Serve with cranberry sauce  Lamb Curry powder, rosemary, garlic, thyme Serve with mint sauce or jelly  Veal Marjoram, basil Serve with current jelly, cranberry sauce  Beef Pepper, bay leaf Serve with dry mustard, unsalted chive butter  Fish [place] leaf, dill Serve with unsalted lemon butter, unsalted parsley butter  Vegetables:    Asparagus Lemon juice   Broccoli Lemon juice   Carrots Mustard dressing parsley, mint, nutmeg, glazed with unsalted butter and sugar   Green beans Marjoram, lemon juice, nutmeg,dill seed   Tomatoes Basil, marjoram, onion   Spice /blend for [employer]" 4 tsp ground  thyme 1 tsp ground sage 3 tsp ground rosemary 4 tsp ground marjoram   Test your knowledge A product that says "Salt Free" may still contain sodium. True or False Garlic Powder and Hot Pepper Sauce an be used as alternative seasonings.True or False Processed foods have more sodium than fresh foods.  True or False Canned Vegetables have less sodium than froze True or False   WAYS TO DECREASE YOUR SODIUM INTAKE Avoid the use of added salt in cooking and at the table.  Table salt (and other prepared seasonings which contain salt) is probably one of the greatest sources of sodium in the diet.  Unsalted foods can gain flavor from the sweet, sour, and butter taste sensations of herbs and spices.  Instead of using salt for seasoning, try the following seasonings with the foods listed.  Remember: how you use them to enhance natural food flavors is limited only by your creativity... Allspice-Meat, fish, eggs, fruit, peas, red and yellow vegetables Almond Extract-Fruit baked goods Anise Seed-Sweet breads, fruit, carrots, beets, cottage cheese, cookies (tastes like licorice) Basil-Meat, fish, eggs, vegetables, rice, vegetables salads, soups, sauces Bay Leaf-Meat, fish, stews, poultry Burnet-Salad, vegetables (cucumber-like flavor) Caraway Seed-Bread, cookies, cottage cheese, meat, vegetables, cheese, rice Cardamon-Baked goods, fruit, soups Celery Powder or seed-Salads, salad dressings, sauces, meatloaf, soup, bread.Do not use  celery salt Chervil-Meats, salads, fish, eggs, vegetables, cottage cheese (parsley-like flavor) Chili Power-Meatloaf, chicken cheese, corn, eggplant, egg dishes Chives-Salads cottage cheese, egg dishes, soups, vegetables, sauces Cilantro-Salsa, casseroles Cinnamon-Baked goods, fruit, pork, lamb, chicken, carrots Cloves-Fruit, baked goods, fish, pot roast, green beans, beets, carrots Coriander-Pastry, cookies, meat,  salads, cheese (lemon-orange flavor) Cumin-Meatloaf,  fish,cheese, eggs, cabbage,fruit pie (caraway flavor) [employer], fruit, eggs, fish, poultry, cottage cheese, vegetables Dill Seed-Meat, cottage cheese, poultry, vegetables, fish, salads, bread Fennel Seed-Bread, cookies, apples, pork, eggs, fish, beets, cabbage, cheese, Licorice-like flavor Garlic-(buds or powder) Salads, meat, poultry, fish, bread, butter, vegetables, potatoes.Do not  use garlic salt Ginger-Fruit, vegetables, baked goods, meat, fish, poultry Horseradish Root-Meet, vegetables, butter Lemon Juice or Extract-Vegetables, fruit, tea, baked goods, fish salads Mace-Baked goods fruit, vegetables, fish, poultry (taste like nutmeg) Maple Extract-Syrups Marjoram-Meat, chicken, fish, vegetables, breads, green salads (taste like Sage) Mint-Tea, lamb, sherbet, vegetables, desserts, carrots, cabbage Mustard, Dry or Seed-Cheese, eggs, meats, vegetables, poultry Nutmeg-Baked goods, fruit, chicken, eggs, vegetables, desserts Onion Powder-Meat, fish, poultry, vegetables, cheese, eggs, bread, rice salads (Do not use   Onion salt) Orange Extract-Desserts, baked goods Oregano-Pasta, eggs, cheese, onions, pork, lamb, fish, chicken, vegetables, green salads Paprika-Meat, fish, poultry, eggs, cheese, vegetables Parsley Flakes-Butter, vegetables, meat fish, poultry, eggs, bread, salads (certain forms may   Contain sodium Pepper-Meat fish, poultry, vegetables, eggs Peppermint Extract-Desserts, baked goods Poppy Seed-Eggs, bread, cheese, fruit dressings, baked goods, noodles, vegetables, cottage  [employer], poultry, meat, fish, cauliflower, turnips,eggs bread Saffron-Rice, bread, veal, chicken, fish, eggs Sage-Meat, fish, poultry, onions, eggplant, tomateos, pork, stews Savory-Eggs, salads, poultry, meat, rice, vegetables, soups, pork Tarragon-Meat, poultry, fish, eggs, butter, vegetables (licorice-like flavor)  Thyme-Meat, poultry, fish, eggs,  vegetables, (clover-like flavor), sauces, soups Tumeric-Salads, butter, eggs, fish, rice, vegetables (saffron-like flavor) Vanilla Extract-Baked goods, candy Vinegar-Salads, vegetables, meat marinades Walnut Extract-baked goods, candy   2. Choose your Foods Wisely   The following is a list of foods to avoid which are high in sodium:  Meats-Avoid all smoked, canned, salt cured, dried and kosher meat and fish as well as Anchovies   [employer] meats:Bologna, Liverwurst, Pastrami Canned meat or fish  Marinated herring Caviar    Pepperoni Corned Beef   Pizza Dried chipped beef  Salami Frozen breaded fish or meat Salt pork Frankfurters or hot dogs  Sardines Gefilte fish   Sausage Ham (boiled ham, Proscuitto Smoked butt    spiced ham)   Spam      TV Dinners Vegetables Canned vegetables (Regular) Relish Canned mushrooms  Sauerkraut Olives    Tomato juice Pickles  Bakery and Dessert Products Canned puddings  Cream pies Cheesecake   Decorated cakes Cookies  Beverages/Juices Tomato juice, regular  Gatorade   V-8 vegetable juice, regular  Breads and Cereals Biscuit mixes   Salted potato chips, corn chips, pretzels Bread stuffing mixes  Salted crackers and rolls Pancake and waffle mixes Self-rising flour  Seasonings Accent    Meat sauces Barbecue sauce  Meat tenderizer Catsup    Monosodium glutamate (MSG) Celery salt   Onion salt Chili sauce   Prepared mustard Garlic salt   Salt, seasoned salt, sea salt Gravy mixes   Soy sauce Horseradish   Steak sauce Ketchup   Tartar sauce Lite salt    Teriyaki sauce Marinade mixes   Worcestershire sauce  Others Baking powder   Cocoa and cocoa mixes Baking soda   Commercial casserole mixes Candy-caramels, chocolate  Dehydrated soups    Bars, fudge,nougats  Instant rice and pasta mixes Canned broth or soup  Maraschino cherries Cheese, aged and processed cheese and cheese spreads  Learning Assessment Quiz  Indicated T  (for True) or F (for False) for each of the following statements:  _____ Fresh fruits and vegetables and unprocessed grains  are generally low in sodium _____ Water may contain a considerable amount of sodium, depending on the source _____ You can always tell if a food is high in sodium by tasting it _____ Certain laxatives my be high in sodium and should be avoided unless prescribed   by a physician or pharmacist _____ Salt substitutes may be used freely by anyone on a sodium restricted diet _____ Sodium is present in table salt, food additives and as a natural component of   most foods _____ Table salt is approximately 90% sodium _____ Limiting sodium intake may help prevent excess fluid accumulation in the body _____ On a sodium-restricted diet, seasonings such as bouillon soy sauce, and    cooking wine should be used in place of table salt _____ On an ingredient list, a product which lists monosodium glutamate as the first   ingredient is an appropriate food to include on a low sodium diet  Circle the best answer(s) to the following statements (Hint: there may be more than one correct answer)  11. On a low-sodium diet, some acceptable snack items are:    A. Olives  F. Bean dip   K. Grapefruit juice    B. Salted Pretzels G. Commercial Popcorn   L. Canned peaches    C. Carrot Sticks  H. Bouillon   M. Unsalted nuts   D. [place] fries  I. Peanut butter crackers N. Salami   E. Sweet pickles J. Tomato Juice   O. Pizza  12.  Seasonings that may be used freely on a reduced - sodium diet include   A. Lemon wedges F.Monosodium glutamate K. Celery seed    B.Soysauce   G. Pepper   L. Mustard powder   C. Sea salt  H. Cooking wine  M. Onion flakes   D. Vinegar  E. Prepared horseradish N. Salsa   E. Sage   J. Worcestershire sauce  O. [address]     Sumner Boast, PA-C  11/29/2021 12:26 PM    [facility] [place], [place], [place]  [address] Phone: [phone]; Fax: [phone]

## 2021-11-20 NOTE — Progress Notes (Addendum)
Cardiology Office Note    Date:  11/29/2021   ID:  Alexander Costain., DOB Apr 20, 1956, MRN [id number]   PCP:  Celene Squibb, MD   [place]  Cardiologist:  Jenkins Rouge, MD   Advanced Practice Provider:  No care team member to display Electrophysiologist:  None   [id number]   Chief Complaint  Patient presents with   Shortness of Breath    History of Present Illness:  Alexander Knight. is a 66 y.o. male with history of hypertension, hyperlipidemia, severe obesity, PAD with 60% left external iliac artery on peripheral angiogram 02/18/2020.  Pain felt to be neuropathic and was seeing a neurosurgeon.  Also has stage IIIa non-small cell lung CA squamous cell carcinoma right upper lobe with mets to the left parotid.  Recent CT scan 10/26/21 showed three-vessel coronary atherosclerosis and a 4.4 cm ascending thoracic aorta needs yearly scanning and they wanted him to be seen. He complains of significant dyspnea just with standing and O2 drops to 81%. He saw pulmonary back in 2019 and wasn't able to due PFT's. He is frustrated to because he used to be so active. He's gained 15 lbs in the past month, 170 lbs in 12 yrs.  Wife sick and he can't cook. He has occasional right chest pain at rest-lasts a few minutes and goes away-like someone is pushing on him.     Past Medical History:  Diagnosis Date   Arthritis    Chronic back pain    Chronic back pain    Headache    HOH (hard of hearing)    Hypertension    Lung mass    Pre-diabetes    Sleep apnea    wears cpap   Wears glasses    Wears partial dentures     Past Surgical History:  Procedure Laterality Date   ABDOMINAL AORTOGRAM W/LOWER EXTREMITY Right 02/18/2020   Procedure: ABDOMINAL AORTOGRAM W/LOWER EXTREMITY;  Surgeon: Lorretta Harp, MD;  Location: [facility];  Service: Cardiovascular;  Laterality: Right;   ANTERIOR CERVICAL DECOMP/DISCECTOMY FUSION N/A 10/27/2018   Procedure: ANTERIOR CERVICAL  THREE-FOUR, FOUR-FIVE DECOMPRESSION/DISCECTOMY FUSION TWO LEVELS;  Surgeon: Kary Kos, MD;  Location: [facility];  Service: Neurosurgery;  Laterality: N/A;   ANTERIOR CERVICAL THREE-FOUR, FOUR-FIVE DECOMPRESSION/DISCECTOMY FUSION TWO LEVELS   BACK SURGERY     IR IMAGING GUIDED PORT INSERTION  11/27/2018   MULTIPLE TOOTH EXTRACTIONS     SPINAL CORD STIMULATOR IMPLANT     VIDEO BRONCHOSCOPY N/A 10/29/2018   Procedure: VIDEO BRONCHOSCOPY;  Surgeon: Laurin Coder, MD;  Location: [facility];  Service: Thoracic;  Laterality: N/A;   VIDEO BRONCHOSCOPY WITH ENDOBRONCHIAL ULTRASOUND N/A 10/27/2018   Procedure: VIDEO BRONCHOSCOPY WITH ENDOBRONCHIAL ULTRASOUND;  Surgeon: Laurin Coder, MD;  Location: [facility];  Service: Thoracic;  Laterality: N/A;   VIDEO BRONCHOSCOPY WITH ENDOBRONCHIAL ULTRASOUND N/A 10/29/2018   Procedure: VIDEO BRONCHOSCOPY WITH ENDOBRONCHIAL ULTRASOUND;  Surgeon: Laurin Coder, MD;  Location: [facility];  Service: Thoracic;  Laterality: N/A;    Current Medications: Current Meds  Medication Sig   amLODipine (NORVASC) 10 MG tablet Take 10 mg by mouth daily.    atorvastatin (LIPITOR) 40 MG tablet Take 40 mg by mouth at bedtime.   baclofen (LIORESAL) 10 MG tablet Take 10 mg by mouth 3 (three) times daily.   BREO ELLIPTA 200-25 MCG/INH AEPB Inhale 1 puff into the lungs daily as needed (asthma).    buprenorphine-naloxone (SUBOXONE) 2-0.5  mg SUBL SL tablet Place 1 tablet under the tongue daily.   Calcium-Magnesium-Zinc (CAL-MAG-ZINC PO) Take 1 tablet by mouth daily.   diphenhydrAMINE (BENADRYL) 25 MG tablet Take 25 mg by mouth every 6 (six) hours as needed for allergies.    furosemide (LASIX) 20 MG tablet Take 20 mg by mouth daily.    ipratropium-albuterol (DUONEB) 0.5-2.5 (3) MG/3ML SOLN Inhale 3 mLs into the lungs every 4 (four) hours as needed (shortness of breath).    losartan (COZAAR) 100 MG tablet Take 100 mg by mouth daily.    meloxicam (MOBIC) 15 MG tablet Take 15 mg by mouth daily.     NARCAN 4 MG/0.1ML LIQD nasal spray kit Place 1 spray into the nose See admin instructions. Administer a single spray in one nostril upon signs of opioid overdose.  Call 911.  Repeat after 3 minutes if no response.   oxyCODONE (ROXICODONE) 15 MG immediate release tablet Take 15 mg by mouth 4 (four) times daily.    potassium chloride SA (K-DUR,KLOR-CON) 20 MEQ tablet Take 20 mEq by mouth every evening.    prochlorperazine (COMPAZINE) 10 MG tablet Take 1 tablet (10 mg total) by mouth every 6 (six) hours as needed for nausea or vomiting.   Umeclidinium-Vilanterol (ANORO ELLIPTA IN) Inhale 1 puff into the lungs daily as needed (SOB/ wheezing).    Current Facility-Administered Medications for the 11/27/21 encounter (Office Visit) with Imogene Burn, PA-C  Medication   sodium chloride flush (NS) 0.9 % injection 3 mL     Allergies:   Bee venom and Iohexol   Social History   Socioeconomic History   Marital status: Married    Spouse name: Not on file   Number of children: Not on file   Years of education: Not on file   Highest education level: Not on file  Occupational History   Not on file  Tobacco Use   Smoking status: Former    Types: Cigarettes   Smokeless tobacco: Never   Tobacco comments:    Quit smoking cigarettes 15 years ago  Vaping Use   Vaping Use: Never used  Substance and Sexual Activity   Alcohol use: No   Drug use: No   Sexual activity: Not Currently  Other Topics Concern   Not on file  Social History Narrative   Not on file   Social Determinants of Health   Financial Resource Strain: Not on file  Food Insecurity: Not on file  Transportation Needs: Not on file  Physical Activity: Not on file  Stress: Not on file  Social Connections: Not on file     Family History:  The patient's  family history includes Lung disease in his father; Lupus in his mother.   ROS:   Please see the history of present illness.    ROS All other systems reviewed and are  negative.   PHYSICAL EXAM:   VS:  BP 115/81    Pulse (!) 120    Ht $R'6\' 1"'RA$  (1.854 m)    Wt (!) 349 lb (158.3 kg)    SpO2 94%    BMI 46.04 kg/m   Physical Exam  GEN: Obese, in no acute distress  Neck: no JVD, carotid bruits, or masses Cardiac:RRR; no murmurs, rubs, or gallops  Respiratory:  decrease breath sounds with scattered wheezing. GI: soft, nontender, nondistended, + BS Ext: without cyanosis, clubbing, or edema, Good distal pulses bilaterally Neuro:  Alert and Oriented x 3 Psych: euthymic mood, full affect  Wt Readings  from Last 3 Encounters:  11/27/21 (!) 349 lb (158.3 kg)  08/29/20 (!) 321 lb 12.8 oz (146 kg)  08/08/20 (!) 325 lb 1.6 oz (147.5 kg)      Studies/Labs Reviewed:   EKG:  EKG is  ordered today.  The ekg ordered today demonstrates sinus tachycardia 112/m otherwise normal  Recent Labs: 10/26/2021: ALT 16; BUN 14; Creatinine, Ser 0.95; Hemoglobin 14.7; Platelets 274; Potassium 3.7; Sodium 136   Lipid Panel No results found for: CHOL, TRIG, HDL, CHOLHDL, VLDL, LDLCALC, LDLDIRECT  Additional studies/ records that were reviewed today include:  CT of the chest 10/26/2021 IMPRESSION: 1. 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 2. No evidence of recurrent metastatic disease in the chest. Mildly enlarged right subcarinal and right paratracheal lymph nodes are stable. 3. Stable dilated main pulmonary artery, suggesting chronic pulmonary arterial hypertension. 4. Stable dilated 4.4 cm ascending thoracic aorta. Recommend annual imaging followup by CTA or MRA. This recommendation follows 2010 ACCF/AHA/AATS/ACR/ASA/SCA/SCAI/SIR/STS/SVM Guidelines for the Diagnosis and Management of Patients with Thoracic Aortic Disease. Circulation. 2010; 121: T885-A076. Aortic aneurysm NOS (ICD10-I71.9). 5. Three-vessel coronary atherosclerosis. 6. Mild diffuse hepatic steatosis. 7. Aortic Atherosclerosis  (ICD10-I70.0).    FINDINGS: Cardiovascular: Normal heart size. No significant pericardial effusion/thickening. Three-vessel coronary atherosclerosis. Right internal jugular Port-A-Cath terminates at the cavoatrial junction. Atherosclerotic thoracic aorta with dilated 4.4 cm ascending thoracic aorta, stable using similar measurement technique. Stable dilated main pulmonary artery (3.7 cm diameter).     Risk Assessment/Calculations:         ASSESSMENT:    1. DOE (dyspnea on exertion)   2. Abnormal nuclear stress test   3. Essential hypertension   4. Hyperlipidemia, unspecified hyperlipidemia type   5. Thoracic aortic aneurysm without rupture, unspecified part   6. Coronary artery calcification seen on CT scan   7. Peripheral arterial disease (HCC)      PLAN:  In order of problems listed above:  DOE with decrease in O2 sats to low 80's just with standing, also sinus tachycardia. Recent CT coronary calcification, occasional rest chest pain, has gained 170 lbs in past 10 yrs, severely deconditioned, lung CA treated with chemo. Suspect much of this is pulmonary/obesity/deconditioning but many CV risk factors. Will order lexican myoview/echo(had chemo) and 2 week ZIO. Wheezing today but not using inhalers-I asked him to use these regularly or we can't do lexiscan. Will refer back to pulmonary.   Addendum: NST 11/29/21 mild to mod ischemia-see above for details. Dr. Eden Emms reviewed and recommends R/L heart cath to further assess. Discussed with patient and wife who are agreeable to proceed. Will check labs and schedule for next week.  I have reviewed the risks, indications, and alternatives to angioplasty and stenting with the patient. Risks include but are not limited to bleeding, infection, vascular injury, stroke, myocardial infection, arrhythmia, kidney injury, radiation-related injury in the case of prolonged fluoroscopy use, emergency cardiac surgery, and death. The patient  understands the risks of serious complication is low (<1%) and patient agrees to proceed.    Hypertension-well controlled  Hyperlipidemia on atorvastatin  4.4 cm ascending thoracic aorta needs yearly follow-up  Coronary calcification on CT 10/2021-contrast dye allergy so won't order CTA  PAD 60% left external iliac on angiogram 02/18/2020 followed by Dr. Allyson Sabal  Obesity-significant weight gain in the past 10 yrs. Needs to lose weight-wife is sick and he doesn't know how to cook. Eating all processed/frozen meals.  Shared Decision Making/Informed Consent  Shared Decision Making/Informed Consent The risks [chest pain, shortness of breath, cardiac arrhythmias, dizziness, blood pressure fluctuations, myocardial infarction, stroke/transient ischemic attack, nausea, vomiting, allergic reaction, radiation exposure, metallic taste sensation and life-threatening complications (estimated to be 1 in 10,000)], benefits (risk stratification, diagnosing coronary artery disease, treatment guidance) and alternatives of a nuclear stress test were discussed in detail with Alexander Hatfield and he agrees to proceed.    Medication Adjustments/Labs and Tests Ordered: Current medicines are reviewed at length with the patient today.  Concerns regarding medicines are outlined above.  Medication changes, Labs and Tests ordered today are listed in the Patient Instructions below. Patient Instructions  Medication Instructions:  Your physician recommends that you continue on your current medications as directed. Please refer to the Current Medication list given to you today.   Labwork: None today  Testing/Procedures: Your physician has requested that you have an echocardiogram. Echocardiography is a painless test that uses sound waves to create images of your heart. It provides your doctor with information about the size and shape of your heart and how well your hearts chambers and valves are working. This procedure takes  approximately one hour. There are no restrictions for this procedure.  Your physician has requested that you have a lexiscan myoview. For further information please visit [email or web address]. Please follow instruction sheet, as given.     ZIO XT- Long Term Monitor Instructions   Your physician has requested you wear your ZIO patch monitor____14___days.   This is a single patch monitor.  [employer] supplies one patch monitor per enrollment.  Additional stickers are not available.   Please do not apply patch if you will be having a Nuclear Stress Test, Echocardiogram, Cardiac CT, MRI, or Chest Xray during the time frame you would be wearing the monitor. The patch cannot be worn during these tests.  You cannot remove and re-apply the ZIO XT patch monitor.   Your ZIO patch monitor will be sent USPS Priority mail from [facility] directly to your home address. The monitor may also be mailed to a PO BOX if home delivery is not available.   It may take 3-5 days to receive your monitor after you have been enrolled.   Once you have received you monitor, please review enclosed instructions.  Your monitor has already been registered assigning a specific monitor serial # to you.   Applying the monitor   Shave hair from upper left chest.   Hold abrader disc by orange tab.  Rub abrader in 40 strokes over left upper chest as indicated in your monitor instructions.   Clean area with 4 enclosed alcohol pads .  Use all pads to assure are is cleaned thoroughly.  Let dry.   Apply patch as indicated in monitor instructions.  Patch will be place under collarbone on left side of chest with arrow pointing upward.   Rub patch adhesive wings for 2 minutes.Remove white label marked "1".  Remove white label marked "2".  Rub patch adhesive wings for 2 additional minutes.   While looking in a mirror, press and release button in center of patch.  A small green light will flash 3-4 times .  This will be your  only indicator the monitor has been turned on.     Do not shower for the first 24 hours.  You may shower after the first 24 hours.   Press button if you feel a symptom. You will hear a small click.  Record Date, Time and Symptom in the  Patient Log Book.   When you are ready to remove patch, follow instructions on last 2 pages of Patient Log Book.  Stick patch monitor onto last page of Patient Log Book.   Place Patient Log Book in [place] box.  Use locking tab on box and tape box closed securely.  The [employer] and [employer] has [employer] on it.  Please place in mailbox as soon as possible.  Your physician should have your test results approximately 7 days after the monitor has been mailed back to [facility].   Call [place] at [phone] if you have questions regarding your ZIO XT patch monitor.  Call them immediately if you see an orange light blinking on your monitor.   If your monitor falls off in less than 4 days contact our Monitor department at [phone].  If your monitor becomes loose or falls off after 4 days call [employer] at [phone] for suggestions on securing your monitor.    Follow-Up: Dr.Nishan after tests completed  Any Other Special Instructions Will Be Listed Below (If Applicable).     Two Gram Sodium Diet 2000 mg  What is Sodium? Sodium is a mineral found naturally in many foods. The most significant source of sodium in the diet is table salt, which is about 40% sodium.  Processed, convenience, and preserved foods also contain a large amount of sodium.  The body needs only 500 mg of sodium daily to function,  A normal diet provides more than enough sodium even if you do not use salt.  Why Limit Sodium? A build up of sodium in the body can cause thirst, increased blood pressure, shortness of breath, and water retention.  Decreasing sodium in the diet can reduce edema and risk of heart attack or stroke associated with high blood pressure.   Keep in mind that there are many other factors involved in these health problems.  Heredity, obesity, lack of exercise, cigarette smoking, stress and what you eat all play a role.  General Guidelines: Do not add salt at the table or in cooking.  One teaspoon of salt contains over 2 grams of sodium. Read food labels Avoid processed and convenience foods Ask your dietitian before eating any foods not dicussed in the menu planning guidelines Consult your physician if you wish to use a salt substitute or a sodium containing medication such as antacids.  Limit milk and milk products to 16 oz (2 cups) per day.  Shopping Hints: READ LABELS!! "Dietetic" does not necessarily mean low sodium. Salt and other sodium ingredients are often added to foods during processing.    Menu Planning Guidelines Food Group Choose More Often Avoid  Beverages (see also the milk group All fruit juices, low-sodium, salt-free vegetables juices, low-sodium carbonated beverages Regular vegetable or tomato juices, commercially softened water used for drinking or cooking  Breads and Cereals Enriched white, wheat, rye and pumpernickel bread, hard rolls and dinner rolls; muffins, cornbread and waffles; most dry cereals, cooked cereal without added salt; unsalted crackers and breadsticks; low sodium or homemade bread crumbs Bread, rolls and crackers with salted tops; quick breads; instant hot cereals; pancakes; commercial bread stuffing; self-rising flower and biscuit mixes; regular bread crumbs or cracker crumbs  Desserts and Sweets Desserts and sweets mad with mild should be within allowance Instant pudding mixes and cake mixes  Fats Butter or margarine; vegetable oils; unsalted salad dressings, regular salad dressings limited to 1 Tbs; light, sour and heavy cream Regular salad dressings containing bacon  fat, bacon bits, and salt pork; snack dips made with instant soup mixes or processed cheese; salted nuts  Fruits Most fresh,  frozen and canned fruits Fruits processed with salt or sodium-containing ingredient (some dried fruits are processed with sodium sulfites        Vegetables Fresh, frozen vegetables and low- sodium canned vegetables Regular canned vegetables, sauerkraut, pickled vegetables, and others prepared in brine; frozen vegetables in sauces; vegetables seasoned with ham, bacon or salt pork  Condiments, Sauces, Miscellaneous  Salt substitute with physician's approval; pepper, herbs, spices; vinegar, lemon or lime juice; hot pepper sauce; garlic powder, onion powder, low sodium soy sauce (1 Tbs.); low sodium condiments (ketchup, chili sauce, mustard) in limited amounts (1 tsp.) fresh ground horseradish; unsalted tortilla chips, pretzels, potato chips, popcorn, salsa (1/4 cup) Any seasoning made with salt including garlic salt, celery salt, onion salt, and seasoned salt; sea salt, rock salt, kosher salt; meat tenderizers; monosodium glutamate; mustard, regular soy sauce, barbecue, sauce, chili sauce, teriyaki sauce, steak sauce, Worcestershire sauce, and most flavored vinegars; canned gravy and mixes; regular condiments; salted snack foods, olives, picles, relish, horseradish sauce, catsup   Food preparation: Try these seasonings Meats:    Pork Sage, onion Serve with applesauce  Chicken Poultry seasoning, thyme, parsley Serve with cranberry sauce  Lamb Curry powder, rosemary, garlic, thyme Serve with mint sauce or jelly  Veal Marjoram, basil Serve with current jelly, cranberry sauce  Beef Pepper, bay leaf Serve with dry mustard, unsalted chive butter  Fish Bay leaf, dill Serve with unsalted lemon butter, unsalted parsley butter  Vegetables:    Asparagus Lemon juice   Broccoli Lemon juice   Carrots Mustard dressing parsley, mint, nutmeg, glazed with unsalted butter and sugar   Green beans Marjoram, lemon juice, nutmeg,dill seed   Tomatoes Basil, marjoram, onion   Spice /blend for [employer]" 4 tsp ground  thyme 1 tsp ground sage 3 tsp ground rosemary 4 tsp ground marjoram   Test your knowledge A product that says "Salt Free" may still contain sodium. True or False Garlic Powder and Hot Pepper Sauce an be used as alternative seasonings.True or False Processed foods have more sodium than fresh foods.  True or False Canned Vegetables have less sodium than froze True or False   WAYS TO DECREASE YOUR SODIUM INTAKE Avoid the use of added salt in cooking and at the table.  Table salt (and other prepared seasonings which contain salt) is probably one of the greatest sources of sodium in the diet.  Unsalted foods can gain flavor from the sweet, sour, and butter taste sensations of herbs and spices.  Instead of using salt for seasoning, try the following seasonings with the foods listed.  Remember: how you use them to enhance natural food flavors is limited only by your creativity... Allspice-Meat, fish, eggs, fruit, peas, red and yellow vegetables Almond Extract-Fruit baked goods Anise Seed-Sweet breads, fruit, carrots, beets, cottage cheese, cookies (tastes like licorice) Basil-Meat, fish, eggs, vegetables, rice, vegetables salads, soups, sauces Bay Leaf-Meat, fish, stews, poultry Burnet-Salad, vegetables (cucumber-like flavor) Caraway Seed-Bread, cookies, cottage cheese, meat, vegetables, cheese, rice Cardamon-Baked goods, fruit, soups Celery Powder or seed-Salads, salad dressings, sauces, meatloaf, soup, bread.Do not use  celery salt Chervil-Meats, salads, fish, eggs, vegetables, cottage cheese (parsley-like flavor) Chili Power-Meatloaf, chicken cheese, corn, eggplant, egg dishes Chives-Salads cottage cheese, egg dishes, soups, vegetables, sauces Cilantro-Salsa, casseroles Cinnamon-Baked goods, fruit, pork, lamb, chicken, carrots Cloves-Fruit, baked goods, fish, pot roast, green beans, beets, carrots Coriander-Pastry, cookies, meat,  salads, cheese (lemon-orange flavor) Cumin-Meatloaf,  fish,cheese, eggs, cabbage,fruit pie (caraway flavor) [employer], fruit, eggs, fish, poultry, cottage cheese, vegetables Dill Seed-Meat, cottage cheese, poultry, vegetables, fish, salads, bread Fennel Seed-Bread, cookies, apples, pork, eggs, fish, beets, cabbage, cheese, Licorice-like flavor Garlic-(buds or powder) Salads, meat, poultry, fish, bread, butter, vegetables, potatoes.Do not  use garlic salt Ginger-Fruit, vegetables, baked goods, meat, fish, poultry Horseradish Root-Meet, vegetables, butter Lemon Juice or Extract-Vegetables, fruit, tea, baked goods, fish salads Mace-Baked goods fruit, vegetables, fish, poultry (taste like nutmeg) Maple Extract-Syrups Marjoram-Meat, chicken, fish, vegetables, breads, green salads (taste like Sage) Mint-Tea, lamb, sherbet, vegetables, desserts, carrots, cabbage Mustard, Dry or Seed-Cheese, eggs, meats, vegetables, poultry Nutmeg-Baked goods, fruit, chicken, eggs, vegetables, desserts Onion Powder-Meat, fish, poultry, vegetables, cheese, eggs, bread, rice salads (Do not use   Onion salt) Orange Extract-Desserts, baked goods Oregano-Pasta, eggs, cheese, onions, pork, lamb, fish, chicken, vegetables, green salads Paprika-Meat, fish, poultry, eggs, cheese, vegetables Parsley Flakes-Butter, vegetables, meat fish, poultry, eggs, bread, salads (certain forms may   Contain sodium Pepper-Meat fish, poultry, vegetables, eggs Peppermint Extract-Desserts, baked goods Poppy Seed-Eggs, bread, cheese, fruit dressings, baked goods, noodles, vegetables, cottage  [employer], poultry, meat, fish, cauliflower, turnips,eggs bread Saffron-Rice, bread, veal, chicken, fish, eggs Sage-Meat, fish, poultry, onions, eggplant, tomateos, pork, stews Savory-Eggs, salads, poultry, meat, rice, vegetables, soups, pork Tarragon-Meat, poultry, fish, eggs, butter, vegetables (licorice-like flavor)  Thyme-Meat, poultry, fish, eggs,  vegetables, (clover-like flavor), sauces, soups Tumeric-Salads, butter, eggs, fish, rice, vegetables (saffron-like flavor) Vanilla Extract-Baked goods, candy Vinegar-Salads, vegetables, meat marinades Walnut Extract-baked goods, candy   2. Choose your Foods Wisely   The following is a list of foods to avoid which are high in sodium:  Meats-Avoid all smoked, canned, salt cured, dried and kosher meat and fish as well as Anchovies   [employer] meats:Bologna, Liverwurst, Pastrami Canned meat or fish  Marinated herring Caviar    Pepperoni Corned Beef   Pizza Dried chipped beef  Salami Frozen breaded fish or meat Salt pork Frankfurters or hot dogs  Sardines Gefilte fish   Sausage Ham (boiled ham, Proscuitto Smoked butt    spiced ham)   Spam      TV Dinners Vegetables Canned vegetables (Regular) Relish Canned mushrooms  Sauerkraut Olives    Tomato juice Pickles  Bakery and Dessert Products Canned puddings  Cream pies Cheesecake   Decorated cakes Cookies  Beverages/Juices Tomato juice, regular  Gatorade   V-8 vegetable juice, regular  Breads and Cereals Biscuit mixes   Salted potato chips, corn chips, pretzels Bread stuffing mixes  Salted crackers and rolls Pancake and waffle mixes Self-rising flour  Seasonings Accent    Meat sauces Barbecue sauce  Meat tenderizer Catsup    Monosodium glutamate (MSG) Celery salt   Onion salt Chili sauce   Prepared mustard Garlic salt   Salt, seasoned salt, sea salt Gravy mixes   Soy sauce Horseradish   Steak sauce Ketchup   Tartar sauce Lite salt    Teriyaki sauce Marinade mixes   Worcestershire sauce  Others Baking powder   Cocoa and cocoa mixes Baking soda   Commercial casserole mixes Candy-caramels, chocolate  Dehydrated soups    Bars, fudge,nougats  Instant rice and pasta mixes Canned broth or soup  Maraschino cherries Cheese, aged and processed cheese and cheese spreads  Learning Assessment Quiz  Indicated T  (for True) or F (for False) for each of the following statements:  _____ Fresh fruits and vegetables and unprocessed grains  are generally low in sodium _____ Water may contain a considerable amount of sodium, depending on the source _____ You can always tell if a food is high in sodium by tasting it _____ Certain laxatives my be high in sodium and should be avoided unless prescribed   by a physician or pharmacist _____ Salt substitutes may be used freely by anyone on a sodium restricted diet _____ Sodium is present in table salt, food additives and as a natural component of   most foods _____ Table salt is approximately 90% sodium _____ Limiting sodium intake may help prevent excess fluid accumulation in the body _____ On a sodium-restricted diet, seasonings such as bouillon soy sauce, and    cooking wine should be used in place of table salt _____ On an ingredient list, a product which lists monosodium glutamate as the first   ingredient is an appropriate food to include on a low sodium diet  Circle the best answer(s) to the following statements (Hint: there may be more than one correct answer)  11. On a low-sodium diet, some acceptable snack items are:    A. Olives  F. Bean dip   K. Grapefruit juice    B. Salted Pretzels G. Commercial Popcorn   L. Canned peaches    C. Carrot Sticks  H. Bouillon   M. Unsalted nuts   D. [place] fries  I. Peanut butter crackers N. Salami   E. Sweet pickles J. Tomato Juice   O. Pizza  12.  Seasonings that may be used freely on a reduced - sodium diet include   A. Lemon wedges F.Monosodium glutamate K. Celery seed    B.Soysauce   G. Pepper   L. Mustard powder   C. Sea salt  H. Cooking wine  M. Onion flakes   D. Vinegar  E. Prepared horseradish N. Salsa   E. Sage   J. Worcestershire sauce  O. [address]     Sumner Boast, PA-C  11/29/2021 12:26 PM    [facility] [address], [place], [place]  [address] Phone: [phone]; Fax: [phone]

## 2021-11-27 ENCOUNTER — Ambulatory Visit (INDEPENDENT_AMBULATORY_CARE_PROVIDER_SITE_OTHER): Payer: Medicare Other

## 2021-11-27 ENCOUNTER — Encounter: Payer: Self-pay | Admitting: Physician Assistant

## 2021-11-27 ENCOUNTER — Ambulatory Visit (INDEPENDENT_AMBULATORY_CARE_PROVIDER_SITE_OTHER): Payer: Medicare Other | Admitting: Physician Assistant

## 2021-11-27 VITALS — BP 115/81 | HR 120 | Ht 73.0 in | Wt 349.0 lb

## 2021-11-27 DIAGNOSIS — R9439 Abnormal result of other cardiovascular function study: Secondary | ICD-10-CM | POA: Diagnosis not present

## 2021-11-27 DIAGNOSIS — I2510 Atherosclerotic heart disease of native coronary artery without angina pectoris: Secondary | ICD-10-CM

## 2021-11-27 DIAGNOSIS — E785 Hyperlipidemia, unspecified: Secondary | ICD-10-CM

## 2021-11-27 DIAGNOSIS — R0609 Other forms of dyspnea: Secondary | ICD-10-CM

## 2021-11-27 DIAGNOSIS — I712 Thoracic aortic aneurysm, without rupture, unspecified: Secondary | ICD-10-CM | POA: Diagnosis not present

## 2021-11-27 DIAGNOSIS — I10 Essential (primary) hypertension: Secondary | ICD-10-CM | POA: Diagnosis not present

## 2021-11-27 DIAGNOSIS — I739 Peripheral vascular disease, unspecified: Secondary | ICD-10-CM

## 2021-11-27 NOTE — Patient Instructions (Signed)
Medication Instructions:  Your physician recommends that you continue on your current medications as directed. Please refer to the Current Medication list given to you today.   Labwork: None today  Testing/Procedures: Your physician has requested that you have an echocardiogram. Echocardiography is a painless test that uses sound waves to create images of your heart. It provides your doctor with information about the size and shape of your heart and how well your hearts chambers and valves are working. This procedure takes approximately one hour. There are no restrictions for this procedure.  Your physician has requested that you have a lexiscan myoview. For further information please visit [email or web address]. Please follow instruction sheet, as given.     ZIO XT- Long Term Monitor Instructions   Your physician has requested you wear your ZIO patch monitor____14___days.   This is a single patch monitor.  [employer] supplies one patch monitor per enrollment.  Additional stickers are not available.   Please do not apply patch if you will be having a Nuclear Stress Test, Echocardiogram, Cardiac CT, MRI, or Chest Xray during the time frame you would be wearing the monitor. The patch cannot be worn during these tests.  You cannot remove and re-apply the ZIO XT patch monitor.   Your ZIO patch monitor will be sent USPS Priority mail from [facility] directly to your home address. The monitor may also be mailed to a PO BOX if home delivery is not available.   It may take 3-5 days to receive your monitor after you have been enrolled.   Once you have received you monitor, please review enclosed instructions.  Your monitor has already been registered assigning a specific monitor serial # to you.   Applying the monitor   Shave hair from upper left chest.   Hold abrader disc by orange tab.  Rub abrader in 40 strokes over left upper chest as indicated in your monitor instructions.   Clean  area with 4 enclosed alcohol pads .  Use all pads to assure are is cleaned thoroughly.  Let dry.   Apply patch as indicated in monitor instructions.  Patch will be place under collarbone on left side of chest with arrow pointing upward.   Rub patch adhesive wings for 2 minutes.Remove white label marked "1".  Remove white label marked "2".  Rub patch adhesive wings for 2 additional minutes.   While looking in a mirror, press and release button in center of patch.  A small green light will flash 3-4 times .  This will be your only indicator the monitor has been turned on.     Do not shower for the first 24 hours.  You may shower after the first 24 hours.   Press button if you feel a symptom. You will hear a small click.  Record Date, Time and Symptom in the Patient Log Book.   When you are ready to remove patch, follow instructions on last 2 pages of Patient Log Book.  Stick patch monitor onto last page of Patient Log Book.   Place Patient Log Book in [place] box.  Use locking tab on box and tape box closed securely.  The [employer] has [employer] on it.  Please place in mailbox as soon as possible.  Your physician should have your test results approximately 7 days after the monitor has been mailed back to [facility].   Call [place] at [phone] if you have questions regarding your ZIO XT patch monitor.  Call them immediately if you see an orange light blinking on your monitor.   If your monitor falls off in less than 4 days contact our Monitor department at [phone].  If your monitor becomes loose or falls off after 4 days call [employer] at [phone] for suggestions on securing your monitor.    Follow-Up: Dr.Nishan after tests completed  Any Other Special Instructions Will Be Listed Below (If Applicable).     Two Gram Sodium Diet 2000 mg  What is Sodium? Sodium is a mineral found naturally in many foods. The most significant source of  sodium in the diet is table salt, which is about 40% sodium.  Processed, convenience, and preserved foods also contain a large amount of sodium.  The body needs only 500 mg of sodium daily to function,  A normal diet provides more than enough sodium even if you do not use salt.  Why Limit Sodium? A build up of sodium in the body can cause thirst, increased blood pressure, shortness of breath, and water retention.  Decreasing sodium in the diet can reduce edema and risk of heart attack or stroke associated with high blood pressure.  Keep in mind that there are many other factors involved in these health problems.  Heredity, obesity, lack of exercise, cigarette smoking, stress and what you eat all play a role.  General Guidelines: Do not add salt at the table or in cooking.  One teaspoon of salt contains over 2 grams of sodium. Read food labels Avoid processed and convenience foods Ask your dietitian before eating any foods not dicussed in the menu planning guidelines Consult your physician if you wish to use a salt substitute or a sodium containing medication such as antacids.  Limit milk and milk products to 16 oz (2 cups) per day.  Shopping Hints: READ LABELS!! "Dietetic" does not necessarily mean low sodium. Salt and other sodium ingredients are often added to foods during processing.    Menu Planning Guidelines Food Group Choose More Often Avoid  Beverages (see also the milk group All fruit juices, low-sodium, salt-free vegetables juices, low-sodium carbonated beverages Regular vegetable or tomato juices, commercially softened water used for drinking or cooking  Breads and Cereals Enriched white, wheat, rye and pumpernickel bread, hard rolls and dinner rolls; muffins, cornbread and waffles; most dry cereals, cooked cereal without added salt; unsalted crackers and breadsticks; low sodium or homemade bread crumbs Bread, rolls and crackers with salted tops; quick breads; instant hot cereals;  pancakes; commercial bread stuffing; self-rising flower and biscuit mixes; regular bread crumbs or cracker crumbs  Desserts and Sweets Desserts and sweets mad with mild should be within allowance Instant pudding mixes and cake mixes  Fats Butter or margarine; vegetable oils; unsalted salad dressings, regular salad dressings limited to 1 Tbs; light, sour and heavy cream Regular salad dressings containing bacon fat, bacon bits, and salt pork; snack dips made with instant soup mixes or processed cheese; salted nuts  Fruits Most fresh, frozen and canned fruits Fruits processed with salt or sodium-containing ingredient (some dried fruits are processed with sodium sulfites        Vegetables Fresh, frozen vegetables and low- sodium canned vegetables Regular canned vegetables, sauerkraut, pickled vegetables, and others prepared in brine; frozen vegetables in sauces; vegetables seasoned with ham, bacon or salt pork  Condiments, Sauces, Miscellaneous  Salt substitute with physician's approval; pepper, herbs, spices; vinegar, lemon or lime juice; hot pepper sauce; garlic powder, onion powder, low sodium soy sauce (1 Tbs.); low  sodium condiments (ketchup, chili sauce, mustard) in limited amounts (1 tsp.) fresh ground horseradish; unsalted tortilla chips, pretzels, potato chips, popcorn, salsa (1/4 cup) Any seasoning made with salt including garlic salt, celery salt, onion salt, and seasoned salt; sea salt, rock salt, kosher salt; meat tenderizers; monosodium glutamate; mustard, regular soy sauce, barbecue, sauce, chili sauce, teriyaki sauce, steak sauce, Worcestershire sauce, and most flavored vinegars; canned gravy and mixes; regular condiments; salted snack foods, olives, picles, relish, horseradish sauce, catsup   Food preparation: Try these seasonings Meats:    Pork Sage, onion Serve with applesauce  Chicken Poultry seasoning, thyme, parsley Serve with cranberry sauce  Lamb Curry powder, rosemary, garlic,  thyme Serve with mint sauce or jelly  Veal Marjoram, basil Serve with current jelly, cranberry sauce  Beef Pepper, bay leaf Serve with dry mustard, unsalted chive butter  Fish [place] leaf, dill Serve with unsalted lemon butter, unsalted parsley butter  Vegetables:    Asparagus Lemon juice   Broccoli Lemon juice   Carrots Mustard dressing parsley, mint, nutmeg, glazed with unsalted butter and sugar   Green beans Marjoram, lemon juice, nutmeg,dill seed   Tomatoes Basil, marjoram, onion   Spice /blend for [employer]" 4 tsp ground thyme 1 tsp ground sage 3 tsp ground rosemary 4 tsp ground marjoram   Test your knowledge A product that says "Salt Free" may still contain sodium. True or False Garlic Powder and Hot Pepper Sauce an be used as alternative seasonings.True or False Processed foods have more sodium than fresh foods.  True or False Canned Vegetables have less sodium than froze True or False   WAYS TO DECREASE YOUR SODIUM INTAKE Avoid the use of added salt in cooking and at the table.  Table salt (and other prepared seasonings which contain salt) is probably one of the greatest sources of sodium in the diet.  Unsalted foods can gain flavor from the sweet, sour, and butter taste sensations of herbs and spices.  Instead of using salt for seasoning, try the following seasonings with the foods listed.  Remember: how you use them to enhance natural food flavors is limited only by your creativity... Allspice-Meat, fish, eggs, fruit, peas, red and yellow vegetables Almond Extract-Fruit baked goods Anise Seed-Sweet breads, fruit, carrots, beets, cottage cheese, cookies (tastes like licorice) Basil-Meat, fish, eggs, vegetables, rice, vegetables salads, soups, sauces Bay Leaf-Meat, fish, stews, poultry Burnet-Salad, vegetables (cucumber-like flavor) Caraway Seed-Bread, cookies, cottage cheese, meat, vegetables, cheese, rice Cardamon-Baked goods, fruit, soups Celery Powder or seed-Salads, salad  dressings, sauces, meatloaf, soup, bread.Do not use  celery salt Chervil-Meats, salads, fish, eggs, vegetables, cottage cheese (parsley-like flavor) Chili Power-Meatloaf, chicken cheese, corn, eggplant, egg dishes Chives-Salads cottage cheese, egg dishes, soups, vegetables, sauces Cilantro-Salsa, casseroles Cinnamon-Baked goods, fruit, pork, lamb, chicken, carrots Cloves-Fruit, baked goods, fish, pot roast, green beans, beets, carrots Coriander-Pastry, cookies, meat, salads, cheese (lemon-orange flavor) Cumin-Meatloaf, fish,cheese, eggs, cabbage,fruit pie (caraway flavor) [employer], fruit, eggs, fish, poultry, cottage cheese, vegetables Dill Seed-Meat, cottage cheese, poultry, vegetables, fish, salads, bread Fennel Seed-Bread, cookies, apples, pork, eggs, fish, beets, cabbage, cheese, Licorice-like flavor Garlic-(buds or powder) Salads, meat, poultry, fish, bread, butter, vegetables, potatoes.Do not  use garlic salt Ginger-Fruit, vegetables, baked goods, meat, fish, poultry Horseradish Root-Meet, vegetables, butter Lemon Juice or Extract-Vegetables, fruit, tea, baked goods, fish salads Mace-Baked goods fruit, vegetables, fish, poultry (taste like nutmeg) Maple Extract-Syrups Marjoram-Meat, chicken, fish, vegetables, breads, green salads (taste like Sage) Mint-Tea, lamb, sherbet, vegetables, desserts, carrots, cabbage Mustard, Dry or Seed-Cheese, eggs, meats,  vegetables, poultry Nutmeg-Baked goods, fruit, chicken, eggs, vegetables, desserts Onion Powder-Meat, fish, poultry, vegetables, cheese, eggs, bread, rice salads (Do not use   Onion salt) Orange Extract-Desserts, baked goods Oregano-Pasta, eggs, cheese, onions, pork, lamb, fish, chicken, vegetables, green salads Paprika-Meat, fish, poultry, eggs, cheese, vegetables Parsley Flakes-Butter, vegetables, meat fish, poultry, eggs, bread, salads (certain forms may   Contain sodium Pepper-Meat fish, poultry, vegetables,  eggs Peppermint Extract-Desserts, baked goods Poppy Seed-Eggs, bread, cheese, fruit dressings, baked goods, noodles, vegetables, cottage  [employer], poultry, meat, fish, cauliflower, turnips,eggs bread Saffron-Rice, bread, veal, chicken, fish, eggs Sage-Meat, fish, poultry, onions, eggplant, tomateos, pork, stews Savory-Eggs, salads, poultry, meat, rice, vegetables, soups, pork Tarragon-Meat, poultry, fish, eggs, butter, vegetables (licorice-like flavor)  Thyme-Meat, poultry, fish, eggs, vegetables, (clover-like flavor), sauces, soups Tumeric-Salads, butter, eggs, fish, rice, vegetables (saffron-like flavor) Vanilla Extract-Baked goods, candy Vinegar-Salads, vegetables, meat marinades Walnut Extract-baked goods, candy   2. Choose your Foods Wisely   The following is a list of foods to avoid which are high in sodium:  Meats-Avoid all smoked, canned, salt cured, dried and kosher meat and fish as well as Anchovies   [employer] meats:Bologna, Liverwurst, Pastrami Canned meat or fish  Marinated herring Caviar    Pepperoni Corned Beef   Pizza Dried chipped beef  Salami Frozen breaded fish or meat Salt pork Frankfurters or hot dogs  Sardines Gefilte fish   Sausage Ham (boiled ham, Proscuitto Smoked butt    spiced ham)   Spam      TV Dinners Vegetables Canned vegetables (Regular) Relish Canned mushrooms  Sauerkraut Olives    Tomato juice Pickles  Bakery and Dessert Products Canned puddings  Cream pies Cheesecake   Decorated cakes Cookies  Beverages/Juices Tomato juice, regular  Gatorade   V-8 vegetable juice, regular  Breads and Cereals Biscuit mixes   Salted potato chips, corn chips, pretzels Bread stuffing mixes  Salted crackers and rolls Pancake and waffle mixes Self-rising flour  Seasonings Accent    Meat sauces Barbecue sauce  Meat tenderizer Catsup    Monosodium glutamate (MSG) Celery salt   Onion salt Chili  sauce   Prepared mustard Garlic salt   Salt, seasoned salt, sea salt Gravy mixes   Soy sauce Horseradish   Steak sauce Ketchup   Tartar sauce Lite salt    Teriyaki sauce Marinade mixes   Worcestershire sauce  Others Baking powder   Cocoa and cocoa mixes Baking soda   Commercial casserole mixes Candy-caramels, chocolate  Dehydrated soups    Bars, fudge,nougats  Instant rice and pasta mixes Canned broth or soup  Maraschino cherries Cheese, aged and processed cheese and cheese spreads  Learning Assessment Quiz  Indicated T (for True) or F (for False) for each of the following statements:  _____ Fresh fruits and vegetables and unprocessed grains are generally low in sodium _____ Water may contain a considerable amount of sodium, depending on the source _____ You can always tell if a food is high in sodium by tasting it _____ Certain laxatives my be high in sodium and should be avoided unless prescribed   by a physician or pharmacist _____ Salt substitutes may be used freely by anyone on a sodium restricted diet _____ Sodium is present in table salt, food additives and as a natural component of   most foods _____ Table salt is approximately 90% sodium _____ Limiting sodium intake may help prevent excess fluid accumulation in the body _____ On a sodium-restricted diet, seasonings  such as bouillon soy sauce, and    cooking wine should be used in place of table salt _____ On an ingredient list, a product which lists monosodium glutamate as the first   ingredient is an appropriate food to include on a low sodium diet  Circle the best answer(s) to the following statements (Hint: there may be more than one correct answer)  11. On a low-sodium diet, some acceptable snack items are:    A. Olives  F. Bean dip   K. Grapefruit juice    B. Salted Pretzels G. Commercial Popcorn   L. Canned peaches    C. Carrot Sticks  H. Bouillon   M. Unsalted nuts   D. [place] fries  I. Peanut butter  crackers N. Salami   E. Sweet pickles J. Tomato Juice   O. Pizza  12.  Seasonings that may be used freely on a reduced - sodium diet include   A. Lemon wedges F.Monosodium glutamate K. Celery seed    B.Soysauce   G. Pepper   L. Mustard powder   C. Sea salt  H. Cooking wine  M. Onion flakes   D. Vinegar  E. Prepared horseradish N. Salsa   E. Sage   J. Worcestershire sauce  O. Chutney

## 2021-11-28 ENCOUNTER — Encounter (HOSPITAL_BASED_OUTPATIENT_CLINIC_OR_DEPARTMENT_OTHER)
Admission: RE | Admit: 2021-11-28 | Discharge: 2021-11-28 | Disposition: A | Discharge status: Home / Self Care | Payer: Medicare Other | Source: Ambulatory Visit | Attending: Physician Assistant | Admitting: Physician Assistant

## 2021-11-28 ENCOUNTER — Other Ambulatory Visit: Payer: Self-pay

## 2021-11-28 ENCOUNTER — Ambulatory Visit (HOSPITAL_COMMUNITY)
Admission: RE | Admit: 2021-11-28 | Discharge: 2021-11-28 | Disposition: A | Discharge status: Home / Self Care | Payer: Medicare Other | Source: Ambulatory Visit | Attending: Physician Assistant | Admitting: Physician Assistant

## 2021-11-28 DIAGNOSIS — R0609 Other forms of dyspnea: Secondary | ICD-10-CM | POA: Diagnosis present

## 2021-11-28 LAB — NM MYOCAR MULTI W/SPECT W/WALL MOTION / EF
LV dias vol: 116 mL (ref 62–150)
LV sys vol: 50 mL
Nuc Stress EF: 57 %
Peak HR: 104 {beats}/min
RATE: 0.4
Rest HR: 81 {beats}/min
Rest Nuclear Isotope Dose: 11 mCi
SDS: 5
SRS: 10
SSS: 15
ST Depression (mm): 0 mm
Stress Nuclear Isotope Dose: 30 mCi
TID: 0.98

## 2021-11-28 MED ORDER — SODIUM CHLORIDE FLUSH 0.9 % IV SOLN
INTRAVENOUS | Status: AC
  Administered 2021-11-28: 10 mL via INTRAVENOUS
  Filled 2021-11-28: qty 10

## 2021-11-28 MED ORDER — TECHNETIUM TC 99M TETROFOSMIN IV KIT
10.0000 | PACK | Freq: Once | INTRAVENOUS | Status: AC | PRN
  Administered 2021-11-28: 11 via INTRAVENOUS

## 2021-11-28 MED ORDER — TECHNETIUM TC 99M TETROFOSMIN IV KIT
30.0000 | PACK | Freq: Once | INTRAVENOUS | Status: AC | PRN
  Administered 2021-11-28: 33 via INTRAVENOUS

## 2021-11-28 MED ORDER — REGADENOSON 0.4 MG/5ML IV SOLN
INTRAVENOUS | Status: AC
  Administered 2021-11-28: 0.4 mg via INTRAVENOUS
  Filled 2021-11-28: qty 5

## 2021-11-29 ENCOUNTER — Telehealth: Payer: Self-pay | Admitting: *Deleted

## 2021-11-29 ENCOUNTER — Encounter: Payer: Self-pay | Admitting: *Deleted

## 2021-11-29 DIAGNOSIS — R0609 Other forms of dyspnea: Secondary | ICD-10-CM

## 2021-11-29 DIAGNOSIS — Z01818 Encounter for other preprocedural examination: Secondary | ICD-10-CM

## 2021-11-29 NOTE — Telephone Encounter (Signed)
Pt scheduled for Left and right heart cath on Tuesday, January 24 with Dr. Irish Lack at 10:30. Pt notified and voiced understanding

## 2021-11-29 NOTE — Addendum Note (Signed)
Addended by: Imogene Burn on: 11/29/2021 12:26 PM   Modules accepted: Orders

## 2021-11-29 NOTE — Addendum Note (Signed)
Addended by: Levonne Hubert on: 11/29/2021 10:01 AM   Modules accepted: Orders

## 2021-11-30 ENCOUNTER — Telehealth: Payer: Self-pay | Admitting: *Deleted

## 2021-11-30 MED ORDER — PREDNISONE 50 MG PO TABS: ORAL_TABLET | ORAL | 0 refills | Status: DC

## 2021-11-30 NOTE — Telephone Encounter (Addendum)
Cardiac catheterization scheduled at [facility] for: Tuesday December 05, 2021 10:30 AM [facility] Main Entrance A [facility]) at: 8:30 AM   Diet-no solid food after midnight prior to cath, clear liquids until 5 AM day of procedure.  CONTRAST ALLERGY: yes- 13 hour Prednisone and Benadryl Prep reviewed with patient: 12/04/21 Prednisone 50 mg 9:30 PM 12/05/21 Prednisone 50 mg 3:30 AM 12/05/21 Prednisone 50 mg and Benadryl 50 mg just prior to leaving home for hospital AM of procedure Patient advised not to drive.  Medication instructions for procedure: -Hold:  Metformin-day of procedure and 48 hours post procedure   Lasix/KCl-AM of procedure -Except hold medications usual morning medications can be taken pre-cath with sips of water including aspirin 81 mg.    Confirmed patient has responsible adult to drive home post procedure and be with patient first 24 hours after arriving home.  [facility] does allow one visitor to accompany you and wait in the hospital waiting room while you are there for your procedure. You and your visitor will be asked to wear a mask once you enter the hospital.   Reviewed with patient's wife (DPR) 11/30/21.

## 2021-12-01 ENCOUNTER — Other Ambulatory Visit: Payer: Self-pay

## 2021-12-01 ENCOUNTER — Other Ambulatory Visit (HOSPITAL_COMMUNITY)
Admission: RE | Admit: 2021-12-01 | Discharge: 2021-12-01 | Disposition: A | Discharge status: Home / Self Care | Payer: Medicare Other | Source: Ambulatory Visit | Attending: Cardiovascular Disease | Admitting: Cardiovascular Disease

## 2021-12-01 DIAGNOSIS — Z01818 Encounter for other preprocedural examination: Secondary | ICD-10-CM | POA: Insufficient documentation

## 2021-12-01 DIAGNOSIS — R0609 Other forms of dyspnea: Secondary | ICD-10-CM | POA: Diagnosis present

## 2021-12-01 LAB — BASIC METABOLIC PANEL
Anion gap: 11 (ref 5–15)
BUN: 14 mg/dL (ref 8–23)
CO2: 26 mmol/L (ref 22–32)
Calcium: 8.6 mg/dL — ABNORMAL LOW (ref 8.9–10.3)
Chloride: 102 mmol/L (ref 98–111)
Creatinine, Ser: 1.11 mg/dL (ref 0.61–1.24)
GFR, Estimated: >60 mL/min (ref >60)
Glucose, Bld: 130 mg/dL — ABNORMAL HIGH (ref 70–99)
Potassium: 4.7 mmol/L (ref 3.5–5.1)
Sodium: 139 mmol/L (ref 135–145)

## 2021-12-01 LAB — CBC
HCT: 49.1 % (ref 39.0–52.0)
Hemoglobin: 14.6 g/dL (ref 13.0–17.0)
MCH: 30.4 pg (ref 26.0–34.0)
MCHC: 29.7 g/dL — ABNORMAL LOW (ref 30.0–36.0)
MCV: 102.1 fL — ABNORMAL HIGH (ref 80.0–100.0)
Platelets: 223 10*3/uL (ref 150–400)
RBC: 4.81 MIL/uL (ref 4.22–5.81)
RDW: 14.6 % (ref 11.5–15.5)
WBC: 8.4 10*3/uL (ref 4.0–10.5)
nRBC: 0 % (ref 0.0–0.2)

## 2021-12-04 NOTE — Telephone Encounter (Signed)
I spoke with patient's wife and reviewed medication instructions:  Contrast allergy: 12/04/21 Prednisone 50 mg 9:30 PM 12/05/21 Prednisone 50 mg 3:30 AM 12/05/21 Prednisone 50 mg and Benadryl 50 mg just prior to leaving home for hospital AM of procedure  Hold: -Metformin-day of procedure and 48 hours post procedure -Lasix/KCl-AM of procedure  Except hold medications usual morning medication can be taken with sips of water including aspirin 81 mg, benadryl 50 mg,Prednisone 50 mg.

## 2021-12-04 NOTE — Telephone Encounter (Signed)
Wife calling with question concerning the medication. She states is not in the instructions. Please advise

## 2021-12-05 ENCOUNTER — Other Ambulatory Visit: Payer: Self-pay

## 2021-12-05 ENCOUNTER — Inpatient Hospital Stay (HOSPITAL_COMMUNITY)
Admission: RE | Admit: 2021-12-05 | Discharge: 2021-12-07 | DRG: 287 | Disposition: A | Discharge status: Home / Self Care | Payer: Medicare Other | Attending: Interventional Cardiology | Admitting: Interventional Cardiology

## 2021-12-05 ENCOUNTER — Encounter (HOSPITAL_COMMUNITY)
Admission: RE | Disposition: A | Discharge status: Home / Self Care | Payer: Self-pay | Source: Home / Self Care | Attending: Interventional Cardiology

## 2021-12-05 ENCOUNTER — Encounter (HOSPITAL_COMMUNITY): Payer: Self-pay | Admitting: Interventional Cardiology

## 2021-12-05 DIAGNOSIS — I272 Pulmonary hypertension, unspecified: Secondary | ICD-10-CM | POA: Diagnosis present

## 2021-12-05 DIAGNOSIS — I5042 Chronic combined systolic (congestive) and diastolic (congestive) heart failure: Secondary | ICD-10-CM | POA: Diagnosis present

## 2021-12-05 DIAGNOSIS — I739 Peripheral vascular disease, unspecified: Secondary | ICD-10-CM | POA: Diagnosis present

## 2021-12-05 DIAGNOSIS — Z791 Long term (current) use of non-steroidal anti-inflammatories (NSAID): Secondary | ICD-10-CM

## 2021-12-05 DIAGNOSIS — E785 Hyperlipidemia, unspecified: Secondary | ICD-10-CM | POA: Diagnosis present

## 2021-12-05 DIAGNOSIS — C3411 Malignant neoplasm of upper lobe, right bronchus or lung: Secondary | ICD-10-CM | POA: Diagnosis present

## 2021-12-05 DIAGNOSIS — Z79899 Other long term (current) drug therapy: Secondary | ICD-10-CM

## 2021-12-05 DIAGNOSIS — G4730 Sleep apnea, unspecified: Secondary | ICD-10-CM | POA: Diagnosis present

## 2021-12-05 DIAGNOSIS — I10 Essential (primary) hypertension: Secondary | ICD-10-CM | POA: Diagnosis present

## 2021-12-05 DIAGNOSIS — I7121 Aneurysm of the ascending aorta, without rupture: Secondary | ICD-10-CM | POA: Diagnosis present

## 2021-12-05 DIAGNOSIS — I110 Hypertensive heart disease with heart failure: Secondary | ICD-10-CM | POA: Diagnosis present

## 2021-12-05 DIAGNOSIS — R0609 Other forms of dyspnea: Secondary | ICD-10-CM

## 2021-12-05 DIAGNOSIS — R0902 Hypoxemia: Secondary | ICD-10-CM | POA: Diagnosis present

## 2021-12-05 DIAGNOSIS — R7303 Prediabetes: Secondary | ICD-10-CM | POA: Diagnosis present

## 2021-12-05 DIAGNOSIS — Z6841 Body Mass Index (BMI) 40.0 and over, adult: Secondary | ICD-10-CM

## 2021-12-05 DIAGNOSIS — Z91041 Radiographic dye allergy status: Secondary | ICD-10-CM

## 2021-12-05 DIAGNOSIS — R0602 Shortness of breath: Secondary | ICD-10-CM | POA: Diagnosis present

## 2021-12-05 DIAGNOSIS — Z981 Arthrodesis status: Secondary | ICD-10-CM

## 2021-12-05 DIAGNOSIS — I2510 Atherosclerotic heart disease of native coronary artery without angina pectoris: Principal | ICD-10-CM

## 2021-12-05 DIAGNOSIS — Z87891 Personal history of nicotine dependence: Secondary | ICD-10-CM

## 2021-12-05 DIAGNOSIS — I5031 Acute diastolic (congestive) heart failure: Secondary | ICD-10-CM

## 2021-12-05 DIAGNOSIS — I5043 Acute on chronic combined systolic (congestive) and diastolic (congestive) heart failure: Secondary | ICD-10-CM | POA: Diagnosis not present

## 2021-12-05 HISTORY — PX: RIGHT/LEFT HEART CATH AND CORONARY ANGIOGRAPHY: CATH118266

## 2021-12-05 LAB — POCT I-STAT EG7
Acid-Base Excess: 1 mmol/L (ref 0.0–2.0)
Acid-Base Excess: 1 mmol/L (ref 0.0–2.0)
Bicarbonate: 27.7 mmol/L (ref 20.0–28.0)
Bicarbonate: 28.4 mmol/L — ABNORMAL HIGH (ref 20.0–28.0)
Calcium, Ion: 1.28 mmol/L (ref 1.15–1.40)
Calcium, Ion: 1.29 mmol/L (ref 1.15–1.40)
HCT: 47 % (ref 39.0–52.0)
HCT: 45 % (ref 39.0–52.0)
Hemoglobin: 16 g/dL (ref 13.0–17.0)
Hemoglobin: 15.3 g/dL (ref 13.0–17.0)
O2 Saturation: 72 %
O2 Saturation: 73 %
Potassium: 4.6 mmol/L (ref 3.5–5.1)
Potassium: 4.6 mmol/L (ref 3.5–5.1)
Sodium: 143 mmol/L (ref 135–145)
Sodium: 143 mmol/L (ref 135–145)
TCO2: 29 mmol/L (ref 22–32)
TCO2: 30 mmol/L (ref 22–32)
pCO2, Ven: 51.7 mmHg (ref 44.0–60.0)
pCO2, Ven: 52.6 mmHg (ref 44.0–60.0)
pH, Ven: 7.337 (ref 7.250–7.430)
pH, Ven: 7.34 (ref 7.250–7.430)
pO2, Ven: 41 mmHg (ref 32.0–45.0)
pO2, Ven: 42 mmHg (ref 32.0–45.0)

## 2021-12-05 LAB — CREATININE, SERUM
Creatinine, Ser: 0.86 mg/dL (ref 0.61–1.24)
GFR, Estimated: >60 mL/min (ref >60)

## 2021-12-05 LAB — POCT I-STAT 7, (LYTES, BLD GAS, ICA,H+H)
Acid-Base Excess: 1 mmol/L (ref 0.0–2.0)
Bicarbonate: 27.7 mmol/L (ref 20.0–28.0)
Calcium, Ion: 1.28 mmol/L (ref 1.15–1.40)
HCT: 47 % (ref 39.0–52.0)
Hemoglobin: 16 g/dL (ref 13.0–17.0)
O2 Saturation: 97 %
Potassium: 4.6 mmol/L (ref 3.5–5.1)
Sodium: 143 mmol/L (ref 135–145)
TCO2: 29 mmol/L (ref 22–32)
pCO2 arterial: 50.8 mmHg — ABNORMAL HIGH (ref 32.0–48.0)
pH, Arterial: 7.344 — ABNORMAL LOW (ref 7.350–7.450)
pO2, Arterial: 93 mmHg (ref 83.0–108.0)

## 2021-12-05 LAB — CBC
HCT: 48.4 % (ref 39.0–52.0)
Hemoglobin: 15.3 g/dL (ref 13.0–17.0)
MCH: 30.1 pg (ref 26.0–34.0)
MCHC: 31.6 g/dL (ref 30.0–36.0)
MCV: 95.3 fL (ref 80.0–100.0)
Platelets: 188 10*3/uL (ref 150–400)
RBC: 5.08 MIL/uL (ref 4.22–5.81)
RDW: 14.5 % (ref 11.5–15.5)
WBC: 10 10*3/uL (ref 4.0–10.5)
nRBC: 0 % (ref 0.0–0.2)

## 2021-12-05 LAB — GLUCOSE, CAPILLARY: Glucose-Capillary: 171 mg/dL — ABNORMAL HIGH (ref 70–99)

## 2021-12-05 SURGERY — RIGHT/LEFT HEART CATH AND CORONARY ANGIOGRAPHY
Anesthesia: LOCAL

## 2021-12-05 MED ORDER — PREGABALIN 100 MG PO CAPS
200.0000 mg | ORAL_CAPSULE | Freq: Two times a day (BID) | ORAL | Status: DC
  Administered 2021-12-05 – 2021-12-07 (×4): 200 mg via ORAL
  Filled 2021-12-05 (×4): qty 2

## 2021-12-05 MED ORDER — POTASSIUM CHLORIDE CRYS ER 20 MEQ PO TBCR
20.0000 meq | EXTENDED_RELEASE_TABLET | Freq: Every evening | ORAL | Status: DC
  Administered 2021-12-05 – 2021-12-06 (×2): 20 meq via ORAL
  Filled 2021-12-05 (×2): qty 1

## 2021-12-05 MED ORDER — FENTANYL CITRATE (PF) 100 MCG/2ML IJ SOLN
INTRAMUSCULAR | Status: DC | PRN
  Administered 2021-12-05 (×2): 25 ug via INTRAVENOUS
  Administered 2021-12-05: 50 ug via INTRAVENOUS

## 2021-12-05 MED ORDER — MELOXICAM 7.5 MG PO TABS
15.0000 mg | ORAL_TABLET | Freq: Every day | ORAL | Status: DC
  Administered 2021-12-05 – 2021-12-07 (×3): 15 mg via ORAL
  Filled 2021-12-05 (×3): qty 2

## 2021-12-05 MED ORDER — MIDAZOLAM HCL 2 MG/2ML IJ SOLN
INTRAMUSCULAR | Status: AC
  Filled 2021-12-05: qty 2

## 2021-12-05 MED ORDER — SODIUM CHLORIDE 0.9 % WEIGHT BASED INFUSION
3.0000 mL/kg/h | INTRAVENOUS | Status: DC
  Administered 2021-12-05: 10:00:00 3 mL/kg/h via INTRAVENOUS

## 2021-12-05 MED ORDER — FLUTICASONE FUROATE-VILANTEROL 200-25 MCG/ACT IN AEPB
1.0000 | INHALATION_SPRAY | Freq: Every day | RESPIRATORY_TRACT | Status: DC | PRN
  Filled 2021-12-05: qty 28

## 2021-12-05 MED ORDER — HEPARIN (PORCINE) IN NACL 1000-0.9 UT/500ML-% IV SOLN
INTRAVENOUS | Status: AC
  Filled 2021-12-05: qty 1000

## 2021-12-05 MED ORDER — ACETAMINOPHEN 325 MG PO TABS: 650.0000 mg | ORAL_TABLET | ORAL | Status: DC | PRN

## 2021-12-05 MED ORDER — HEPARIN SODIUM (PORCINE) 1000 UNIT/ML IJ SOLN
INTRAMUSCULAR | Status: AC
  Filled 2021-12-05: qty 10

## 2021-12-05 MED ORDER — HYDRALAZINE HCL 20 MG/ML IJ SOLN: 10.0000 mg | INTRAMUSCULAR | Status: AC | PRN

## 2021-12-05 MED ORDER — IPRATROPIUM-ALBUTEROL 20-100 MCG/ACT IN AERS
1.0000 | INHALATION_SPRAY | RESPIRATORY_TRACT | Status: DC | PRN
  Filled 2021-12-05: qty 4

## 2021-12-05 MED ORDER — ASPIRIN 81 MG PO CHEW: 81.0000 mg | CHEWABLE_TABLET | ORAL | Status: DC

## 2021-12-05 MED ORDER — PROCHLORPERAZINE MALEATE 10 MG PO TABS
10.0000 mg | ORAL_TABLET | Freq: Four times a day (QID) | ORAL | Status: DC | PRN
  Filled 2021-12-05: qty 1

## 2021-12-05 MED ORDER — OXYCODONE HCL 5 MG PO TABS
15.0000 mg | ORAL_TABLET | Freq: Four times a day (QID) | ORAL | Status: DC
  Administered 2021-12-05 – 2021-12-07 (×7): 15 mg via ORAL
  Filled 2021-12-05 (×7): qty 3

## 2021-12-05 MED ORDER — SODIUM CHLORIDE 0.9 % WEIGHT BASED INFUSION: 1.0000 mL/kg/h | INTRAVENOUS | Status: DC

## 2021-12-05 MED ORDER — HEPARIN (PORCINE) IN NACL 1000-0.9 UT/500ML-% IV SOLN
INTRAVENOUS | Status: DC | PRN
  Administered 2021-12-05 (×2): 500 mL

## 2021-12-05 MED ORDER — SODIUM CHLORIDE 0.9% FLUSH: 3.0000 mL | INTRAVENOUS | Status: DC | PRN

## 2021-12-05 MED ORDER — FENTANYL CITRATE (PF) 100 MCG/2ML IJ SOLN
INTRAMUSCULAR | Status: AC
  Filled 2021-12-05: qty 2

## 2021-12-05 MED ORDER — IOHEXOL 350 MG/ML SOLN
INTRAVENOUS | Status: DC | PRN
  Administered 2021-12-05 (×2): 90 mL

## 2021-12-05 MED ORDER — ALBUTEROL SULFATE HFA 108 (90 BASE) MCG/ACT IN AERS: 2.0000 | INHALATION_SPRAY | Freq: Four times a day (QID) | RESPIRATORY_TRACT | Status: DC | PRN

## 2021-12-05 MED ORDER — ONDANSETRON HCL 4 MG/2ML IJ SOLN: 4.0000 mg | Freq: Four times a day (QID) | INTRAMUSCULAR | Status: DC | PRN

## 2021-12-05 MED ORDER — ATORVASTATIN CALCIUM 40 MG PO TABS
40.0000 mg | ORAL_TABLET | Freq: Every day | ORAL | Status: DC
  Administered 2021-12-05: 22:00:00 40 mg via ORAL
  Filled 2021-12-05: qty 1

## 2021-12-05 MED ORDER — SODIUM CHLORIDE 0.9% FLUSH
3.0000 mL | Freq: Two times a day (BID) | INTRAVENOUS | Status: DC
  Administered 2021-12-05 – 2021-12-07 (×4): 3 mL via INTRAVENOUS

## 2021-12-05 MED ORDER — AMLODIPINE BESYLATE 10 MG PO TABS
10.0000 mg | ORAL_TABLET | Freq: Every day | ORAL | Status: DC
  Administered 2021-12-05 – 2021-12-07 (×3): 10 mg via ORAL
  Filled 2021-12-05 (×3): qty 1

## 2021-12-05 MED ORDER — VERAPAMIL HCL 2.5 MG/ML IV SOLN
INTRAVENOUS | Status: DC | PRN
  Administered 2021-12-05: 12:00:00 10 mL via INTRA_ARTERIAL

## 2021-12-05 MED ORDER — LIDOCAINE HCL (PF) 1 % IJ SOLN
INTRAMUSCULAR | Status: AC
  Filled 2021-12-05: qty 30

## 2021-12-05 MED ORDER — LOSARTAN POTASSIUM 50 MG PO TABS
100.0000 mg | ORAL_TABLET | Freq: Every day | ORAL | Status: DC
  Administered 2021-12-05 – 2021-12-07 (×3): 100 mg via ORAL
  Filled 2021-12-05 (×3): qty 2

## 2021-12-05 MED ORDER — VITAMIN D 25 MCG (1000 UNIT) PO TABS
5000.0000 [IU] | ORAL_TABLET | ORAL | Status: DC
  Administered 2021-12-05: 17:00:00 5000 [IU] via ORAL
  Filled 2021-12-05: qty 5

## 2021-12-05 MED ORDER — LIDOCAINE HCL (PF) 1 % IJ SOLN
INTRAMUSCULAR | Status: DC | PRN
  Administered 2021-12-05 (×2): 2 mL via INTRADERMAL

## 2021-12-05 MED ORDER — SODIUM CHLORIDE 0.9 % IV SOLN: 250.0000 mL | INTRAVENOUS | Status: DC | PRN

## 2021-12-05 MED ORDER — BACLOFEN 10 MG PO TABS
10.0000 mg | ORAL_TABLET | Freq: Three times a day (TID) | ORAL | Status: DC
  Administered 2021-12-05 – 2021-12-07 (×6): 10 mg via ORAL
  Filled 2021-12-05 (×6): qty 1

## 2021-12-05 MED ORDER — VERAPAMIL HCL 2.5 MG/ML IV SOLN
INTRAVENOUS | Status: AC
  Filled 2021-12-05: qty 2

## 2021-12-05 MED ORDER — AMITRIPTYLINE HCL 25 MG PO TABS
100.0000 mg | ORAL_TABLET | Freq: Every day | ORAL | Status: DC
  Administered 2021-12-05 – 2021-12-06 (×2): 100 mg via ORAL
  Filled 2021-12-05 (×2): qty 4

## 2021-12-05 MED ORDER — HEPARIN SODIUM (PORCINE) 1000 UNIT/ML IJ SOLN
INTRAMUSCULAR | Status: DC | PRN
  Administered 2021-12-05: 6000 [IU] via INTRAVENOUS

## 2021-12-05 MED ORDER — MIDAZOLAM HCL 2 MG/2ML IJ SOLN
INTRAMUSCULAR | Status: DC | PRN
  Administered 2021-12-05: 1 mg via INTRAVENOUS
  Administered 2021-12-05: 2 mg via INTRAVENOUS
  Administered 2021-12-05: 1 mg via INTRAVENOUS

## 2021-12-05 MED ORDER — FUROSEMIDE 10 MG/ML IJ SOLN
60.0000 mg | Freq: Once | INTRAMUSCULAR | Status: AC
  Administered 2021-12-05: 17:00:00 60 mg via INTRAVENOUS
  Filled 2021-12-05: qty 6

## 2021-12-05 MED ORDER — SODIUM CHLORIDE 0.9% FLUSH: 3.0000 mL | Freq: Two times a day (BID) | INTRAVENOUS | Status: DC

## 2021-12-05 MED ORDER — DIPHENHYDRAMINE HCL 25 MG PO CAPS
25.0000 mg | ORAL_CAPSULE | Freq: Four times a day (QID) | ORAL | Status: DC | PRN
  Filled 2021-12-05: qty 1

## 2021-12-05 MED ORDER — NALOXONE HCL 4 MG/0.1ML NA LIQD: 1.0000 | NASAL | Status: DC

## 2021-12-05 MED ORDER — HEPARIN SODIUM (PORCINE) 5000 UNIT/ML IJ SOLN
5000.0000 [IU] | Freq: Three times a day (TID) | INTRAMUSCULAR | Status: DC
  Administered 2021-12-05 – 2021-12-07 (×6): 5000 [IU] via SUBCUTANEOUS
  Filled 2021-12-05 (×6): qty 1

## 2021-12-05 MED ORDER — LABETALOL HCL 5 MG/ML IV SOLN: 10.0000 mg | INTRAVENOUS | Status: AC | PRN

## 2021-12-05 MED ORDER — SODIUM CHLORIDE 0.9% FLUSH
3.0000 mL | Freq: Two times a day (BID) | INTRAVENOUS | Status: DC
  Administered 2021-12-05 – 2021-12-07 (×2): 3 mL via INTRAVENOUS

## 2021-12-05 SURGICAL SUPPLY — 13 items
CATH 5FR JL3.5 JR4 ANG PIG MP (CATHETERS) ×1 IMPLANT
CATH BALLN WEDGE 5F 110CM (CATHETERS) ×1 IMPLANT
CATH INFINITI 5FR JL4 (CATHETERS) ×1 IMPLANT
DEVICE RAD COMP TR BAND LRG (VASCULAR PRODUCTS) ×1 IMPLANT
GLIDESHEATH SLEND SS 6F .021 (SHEATH) ×1 IMPLANT
GUIDEWIRE INQWIRE 1.5J.035X260 (WIRE) IMPLANT
INQWIRE 1.5J .035X260CM (WIRE) ×2
KIT HEART LEFT (KITS) ×2 IMPLANT
PACK CARDIAC CATHETERIZATION (CUSTOM PROCEDURE TRAY) ×2 IMPLANT
SHEATH GLIDE SLENDER 4/5FR (SHEATH) ×1 IMPLANT
SHEATH PROBE COVER 6X72 (BAG) ×1 IMPLANT
TRANSDUCER W/STOPCOCK (MISCELLANEOUS) ×2 IMPLANT
TUBING CIL FLEX 10 FLL-RA (TUBING) ×2 IMPLANT

## 2021-12-05 NOTE — Interval H&P Note (Signed)
Cath Lab Visit (complete for each Cath Lab visit)  Clinical Evaluation Leading to the Procedure:   ACS: No.  Non-ACS:    Anginal Classification: CCS III  Anti-ischemic medical therapy: Minimal Therapy (1 class of medications)  Non-Invasive Test Results: Intermediate-risk stress test findings: cardiac mortality 1-3%/year  Prior CABG: No previous CABG      History and Physical Interval Note:  12/05/2021 11:48 AM  Alexander Hatfield.  has presented today for surgery, with the diagnosis of DOE,abnormal myoview.  The various methods of treatment have been discussed with the patient and family. After consideration of risks, benefits and other options for treatment, the patient has consented to  Procedure(s): RIGHT/LEFT HEART CATH AND CORONARY ANGIOGRAPHY (N/A) as a surgical intervention.  The patient's history has been reviewed, patient examined, no change in status, stable for surgery.  I have reviewed the patient's chart and labs.  Questions were answered to the patient's satisfaction.     Larae Grooms

## 2021-12-05 NOTE — Progress Notes (Signed)
TR BAND REMOVAL  LOCATION:    Right radial   DEFLATED PER PROTOCOL:    Yes.    TIME BAND OFF / DRESSING APPLIED:    1530 a clean dry dressing applied with gauze and tegaderm   SITE UPON ARRIVAL:    Level 0  SITE AFTER BAND REMOVAL:    Level 0  CIRCULATION SENSATION AND MOVEMENT:    Within Normal Limits   Yes.    COMMENTS:   Care instruction given to patient

## 2021-12-05 NOTE — Progress Notes (Signed)
Patient transferred from cath lab at 1558hrs.  Oriented to unit and plan of care for shift.  Right radial site with transparent dressing with gauze in place, level zero.  Reviewed post cath instructions, patient verbalized understanding.

## 2021-12-05 NOTE — Progress Notes (Signed)
°  Transition of Care [facility]) Screening Note   Patient Details  Name: Alexander Hatfield. Date of Birth: 11/05/1956   Transition of Care [facility]) CM/SW Contact:    Milas Gain, [place] Phone Number: 12/05/2021, 7:47 PM    Transition of Care Department [facility]) has reviewed patient and no TOC needs have been identified at this time. We will continue to monitor patient advancement through interdisciplinary progression rounds. If new patient transition needs arise, please place a TOC consult.

## 2021-12-06 ENCOUNTER — Inpatient Hospital Stay (HOSPITAL_COMMUNITY): Payer: Medicare Other

## 2021-12-06 ENCOUNTER — Encounter (HOSPITAL_COMMUNITY): Payer: Self-pay | Admitting: Interventional Cardiology

## 2021-12-06 DIAGNOSIS — I5031 Acute diastolic (congestive) heart failure: Secondary | ICD-10-CM | POA: Diagnosis not present

## 2021-12-06 DIAGNOSIS — R0609 Other forms of dyspnea: Secondary | ICD-10-CM | POA: Diagnosis not present

## 2021-12-06 DIAGNOSIS — I2510 Atherosclerotic heart disease of native coronary artery without angina pectoris: Secondary | ICD-10-CM | POA: Diagnosis not present

## 2021-12-06 LAB — ECHOCARDIOGRAM COMPLETE BUBBLE STUDY
AR max vel: 5.6 cm2
AV Area VTI: 6.57 cm2
AV Area mean vel: 5.23 cm2
AV Mean grad: 3 mmHg
AV Peak grad: 4.7 mmHg
Ao pk vel: 1.08 m/s
Area-P 1/2: 2.13 cm2
Height: 73 in
S' Lateral: 4.81 cm
Weight: 5593.6 oz

## 2021-12-06 LAB — BASIC METABOLIC PANEL
Anion gap: 7 (ref 5–15)
BUN: 16 mg/dL (ref 8–23)
CO2: 27 mmol/L (ref 22–32)
Calcium: 8.7 mg/dL — ABNORMAL LOW (ref 8.9–10.3)
Chloride: 104 mmol/L (ref 98–111)
Creatinine, Ser: 1.09 mg/dL (ref 0.61–1.24)
GFR, Estimated: >60 mL/min (ref >60)
Glucose, Bld: 119 mg/dL — ABNORMAL HIGH (ref 70–99)
Potassium: 3.7 mmol/L (ref 3.5–5.1)
Sodium: 138 mmol/L (ref 135–145)

## 2021-12-06 MED ORDER — PERFLUTREN LIPID MICROSPHERE
1.0000 mL | INTRAVENOUS | Status: AC | PRN
  Administered 2021-12-06: 16:00:00 3 mL via INTRAVENOUS
  Filled 2021-12-06: qty 10

## 2021-12-06 MED ORDER — ASPIRIN EC 81 MG PO TBEC
81.0000 mg | DELAYED_RELEASE_TABLET | Freq: Every day | ORAL | Status: DC
  Administered 2021-12-06 – 2021-12-07 (×2): 81 mg via ORAL
  Filled 2021-12-06 (×2): qty 1

## 2021-12-06 MED ORDER — ATORVASTATIN CALCIUM 80 MG PO TABS
80.0000 mg | ORAL_TABLET | Freq: Every day | ORAL | Status: DC
  Administered 2021-12-06: 22:00:00 80 mg via ORAL
  Filled 2021-12-06: qty 1

## 2021-12-06 NOTE — Progress Notes (Signed)
Mobility Specialist Progress Note    12/06/21 1530  Mobility  Bed Position Chair  Activity Ambulated with assistance in hallway  Level of Assistance Contact guard assist, steadying assist  Assistive Device Front wheel walker  Distance Ambulated (ft) 120 ft  Activity Response Tolerated fair  $Mobility charge 1 Mobility   Pre-Mobility: 71 HR, 90% SpO2 During Mobility: 115 HR, 88% SpO2 Post-Mobility: 84 HR, 92% SpO2  Pt received in bed and agreeable. Ambulated on RA. Pt SOB with exertion but encouraged focus on pursed lip breathing. Returned to chair with call bell in reach.   [facility] Mobility Specialist  M.S. 2C and 6E: [phone] M.S. 4E: [phone]

## 2021-12-06 NOTE — Progress Notes (Addendum)
Progress Note  Patient Name: Alexander Hatfield. Date of Encounter: 12/06/2021  [facility] Cardiologist: Jenkins Rouge, MD Estella Husk PA-C  Subjective   3-year history of oxygen desaturation with standing, physical immobility, significant weight gain, and referred for left and right heart cath because of an abnormal nuclear perfusion study in the absence of angina.  Has known three-vessel coronary atherosclerosis based upon noncardiac CT scan.  Admitted after cath because of concern about anatomy.  The greatest concern was the appearance of the ostial LAD, however the nuclear study demonstrated no evidence of anterior ischemia.  The defect on the nuclear study was in the inferior wall.  There is a calcified/hazy 68% mid to distal RCA stenosis.  Inpatient Medications    Scheduled Meds:  amitriptyline  100 mg Oral QHS   amLODipine  10 mg Oral Daily   atorvastatin  40 mg Oral QHS   baclofen  10 mg Oral TID   cholecalciferol  5,000 Units Oral Weekly   heparin  5,000 Units Subcutaneous Q8H   losartan  100 mg Oral Daily   meloxicam  15 mg Oral Daily   oxyCODONE  15 mg Oral QID   potassium chloride SA  20 mEq Oral QPM   pregabalin  200 mg Oral BID   sodium chloride flush  3 mL Intravenous Q12H   sodium chloride flush  3 mL Intravenous Q12H   Continuous Infusions:  sodium chloride     PRN Meds: sodium chloride, acetaminophen, diphenhydrAMINE, fluticasone furoate-vilanterol, Ipratropium-Albuterol, ondansetron (ZOFRAN) IV, prochlorperazine, sodium chloride flush   Vital Signs    Vitals:   12/05/21 1726 12/05/21 2104 12/06/21 0155 12/06/21 0527  BP: (!) 154/80 (!) 107/59 119/63 110/78  Pulse: 73 86 69 62  Resp: (!) 24 20 20 19   Temp:  98 F (36.7 C) 97.9 F (36.6 C) 97.7 F (36.5 C)  TempSrc:  Oral Oral Oral  SpO2: 93% 94% 95% 92%  Weight:      Height:        Intake/Output Summary (Last 24 hours) at 12/06/2021 0819 Last data filed at 12/06/2021 0157 Gross per 24 hour   Intake 1118.57 ml  Output 2275 ml  Net -1156.43 ml   Last 3 Weights 12/05/2021 12/05/2021 11/27/2021  Weight (lbs) 349 lb 9.6 oz 348 lb 349 lb  Weight (kg) 158.578 kg 157.852 kg 158.305 kg      Telemetry    Normal sinus rhythm- Personally Reviewed  ECG    Not repeated at- Personally Reviewed  Physical Exam  Morbidly obese Radial cath site unremarkable GEN: No acute distress.   Neck: No JVD Cardiac: RRR, no murmurs, rubs, or gallops.  Respiratory: Clear to auscultation bilaterally. GI: Soft, nontender, non-distended  MS: No edema; No deformity. Neuro:  Nonfocal  Psych: Normal affect   Labs    High Sensitivity Troponin:  No results for input(s): TROPONINIHS in the last 720 hours.   Chemistry Recent Labs  Lab 12/01/21 1040 12/05/21 1204 12/05/21 1211 12/05/21 1615 12/06/21 0357  NA 139 143 143   143  --  138  K 4.7 4.6 4.6   4.6  --  3.7  CL 102  --   --   --  104  CO2 26  --   --   --  27  GLUCOSE 130*  --   --   --  119*  BUN 14  --   --   --  16  CREATININE 1.11  --   --  0.86 1.09  CALCIUM 8.6*  --   --   --  8.7*  GFRNONAA >60  --   --  >60 >60  ANIONGAP 11  --   --   --  7    Lipids No results for input(s): CHOL, TRIG, HDL, LABVLDL, LDLCALC, CHOLHDL in the last 168 hours.  Hematology Recent Labs  Lab 12/01/21 1040 12/05/21 1204 12/05/21 1211 12/05/21 1615  WBC 8.4  --   --  10.0  RBC 4.81  --   --  5.08  HGB 14.6 16.0 15.3   16.0 15.3  HCT 49.1 47.0 45.0   47.0 48.4  MCV 102.1*  --   --  95.3  MCH 30.4  --   --  30.1  MCHC 29.7*  --   --  31.6  RDW 14.6  --   --  14.5  PLT 223  --   --  188   Thyroid No results for input(s): TSH, FREET4 in the last 168 hours.  BNPNo results for input(s): BNP, PROBNP in the last 168 hours.  DDimer No results for input(s): DDIMER in the last 168 hours.   Radiology    CARDIAC CATHETERIZATION  Addendum Date: 12/05/2021     Mid RCA lesion is 75% stenosed.   RPDA lesion is 75% stenosed.   RPAV lesion is 70%  stenosed.   Ost LAD to Prox LAD lesion is 80% stenosed.   Ost Cx lesion is 25% stenosed.   Mid LAD lesion is 40% stenosed.   LV end diastolic pressure is mildly elevated.   The left ventricular ejection fraction is 50-55% by visual estimate.   Hemodynamic findings consistent with mild pulmonary hypertension.   There is no aortic valve stenosis.   Aortic saturation 97%, PA saturation 73%, pulmonary artery pressure 45/25, mean PA pressure 34 mmHg, mean pulmonary capillary wedge pressure 22 mmHg, cardiac output 7.6 L/min, cardiac index 2.79. Complicated situation in this patient with multiple comorbidities.  Ostial LAD lesion and RCA disease would best be treated with bypass surgery.  He does have lung cancer, morbid obesity and likely some lung disease causing intermittent hypoxemia.  He is volume overloaded.  Will diurese and start medical therapy. Treatment of the ostial LAD would require stent placement in the left main jailing the circumflex.  Result Date: 12/05/2021   Mid RCA lesion is 75% stenosed.   RPDA lesion is 75% stenosed.   RPAV lesion is 70% stenosed.   Ost LAD to Prox LAD lesion is 80% stenosed.   Ost Cx lesion is 25% stenosed.   Mid LAD lesion is 40% stenosed.   LV end diastolic pressure is mildly elevated.   The left ventricular ejection fraction is 50-55% by visual estimate.   Hemodynamic findings consistent with mild pulmonary hypertension.   There is no aortic valve stenosis. Complicated situation in this patient with multiple comorbidities.  Ostial LAD lesion and RCA disease would best be treated with bypass surgery.  He does have lung cancer, morbid obesity and likely some lung disease causing intermittent hypoxemia.  He is volume overloaded.  Will diurese and start medical therapy. Treatment of the ostial LAD would require stent placement in the left main jailing the circumflex.    Cardiac Studies   Cardiac catheterization 12/06/2021: Conclusion      Mid RCA lesion is 75% stenosed.    RPDA lesion is 75% stenosed.   RPAV lesion is 70% stenosed.   Ost LAD to Prox LAD lesion is  80% stenosed.   Ost Cx lesion is 25% stenosed.   Mid LAD lesion is 40% stenosed.   LV end diastolic pressure is mildly elevated.   The left ventricular ejection fraction is 50-55% by visual estimate.   Hemodynamic findings consistent with mild pulmonary hypertension.   There is no aortic valve stenosis.   Aortic saturation 97%, PA saturation 73%, pulmonary artery pressure 45/25, mean PA pressure 34 mmHg, mean pulmonary capillary wedge pressure 22 mmHg, cardiac output 7.6 L/min, cardiac index 2.79  Diagnostic Dominance: Right      Patient Profile     66 y.o. male with coronary artery disease without angina based upon three-vessel coronary calcification on CT scan, obstructive sleep apnea, ascending aortic aneurysm, primary hypertension, hyperlipidemia, severe obesity, PAD with 60% left external iliac artery on peripheral angiogram 02/18/2020 , stage IIIa non-small cell lung CA squamous cell carcinoma right upper lobe with mets to the left parotid, ascending aortic aneurysm and 3-year and history of positional dyspnea and desaturation with standing.  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  Document fall and O2 saturation with standing.  May need transesophageal echo.  If documented, PFO closure would help the patient significantly. Coronary artery disease without angina pectoris: Moderate CAD in the mid to distal RCA and ostial LAD but without symptoms of angina.  Continue medical therapy and risk factor modification. Chronic combined systolic and diastolic heart failure: 2D Doppler echocardiogram to assess LV systolic function.  Nuclear study suggested EF greater than 50%.  50 to 55% by hand-injection yesterday.  LVEDP and mean wedge pressures are elevated above 19 mmHg.  After echo, plan to  institute guideline directed heart failure strategy.  Addendum: 12/06/2021 at 14:20: Pt SpO2 while laying flat 91%, sitting up >92% and 2 minutes after Standing up 92% on room air. While standing up, pt's SpO2 initially dropped to 85% and then came back up to 92%  For questions or updates, please contact [place] Please consult [email or web address] for contact info under        Signed, Sinclair Grooms, MD  12/06/2021, 8:19 AM

## 2021-12-06 NOTE — Progress Notes (Signed)
Pt SpO2 while laying flat 91%, sitting up >92% and 2 minutes after Standing up 92% on room air. While standing up, pt's SpO2 initially dropped to 85% and then came back up to 92%.

## 2021-12-06 NOTE — Progress Notes (Signed)
°  Echocardiogram 2D Echocardiogram has been performed.  Alexander Hatfield 12/06/2021, 3:35 PM

## 2021-12-07 ENCOUNTER — Encounter: Payer: Self-pay | Admitting: Internal Medicine

## 2021-12-07 ENCOUNTER — Other Ambulatory Visit (HOSPITAL_COMMUNITY): Payer: Self-pay

## 2021-12-07 ENCOUNTER — Other Ambulatory Visit: Payer: Self-pay | Admitting: Cardiology

## 2021-12-07 DIAGNOSIS — R0609 Other forms of dyspnea: Secondary | ICD-10-CM | POA: Diagnosis not present

## 2021-12-07 DIAGNOSIS — Z79899 Other long term (current) drug therapy: Secondary | ICD-10-CM

## 2021-12-07 DIAGNOSIS — R0902 Hypoxemia: Secondary | ICD-10-CM

## 2021-12-07 DIAGNOSIS — I5043 Acute on chronic combined systolic (congestive) and diastolic (congestive) heart failure: Secondary | ICD-10-CM | POA: Diagnosis not present

## 2021-12-07 DIAGNOSIS — I2510 Atherosclerotic heart disease of native coronary artery without angina pectoris: Secondary | ICD-10-CM

## 2021-12-07 DIAGNOSIS — E785 Hyperlipidemia, unspecified: Secondary | ICD-10-CM

## 2021-12-07 LAB — BASIC METABOLIC PANEL
Anion gap: 10 (ref 5–15)
BUN: 25 mg/dL — ABNORMAL HIGH (ref 8–23)
CO2: 26 mmol/L (ref 22–32)
Calcium: 8.5 mg/dL — ABNORMAL LOW (ref 8.9–10.3)
Chloride: 105 mmol/L (ref 98–111)
Creatinine, Ser: 1.23 mg/dL (ref 0.61–1.24)
GFR, Estimated: >60 mL/min (ref >60)
Glucose, Bld: 113 mg/dL — ABNORMAL HIGH (ref 70–99)
Potassium: 3.9 mmol/L (ref 3.5–5.1)
Sodium: 141 mmol/L (ref 135–145)

## 2021-12-07 LAB — LIPID PANEL
Cholesterol: 135 mg/dL (ref 0–200)
HDL: 36 mg/dL — ABNORMAL LOW (ref >40)
LDL Cholesterol: 64 mg/dL (ref 0–99)
Total CHOL/HDL Ratio: 3.8 RATIO
Triglycerides: 173 mg/dL — ABNORMAL HIGH (ref <150)
VLDL: 35 mg/dL (ref 0–40)

## 2021-12-07 LAB — HEMOGLOBIN A1C
Hgb A1c MFr Bld: 6.7 % — ABNORMAL HIGH (ref 4.8–5.6)
Mean Plasma Glucose: 146 mg/dL

## 2021-12-07 MED ORDER — FUROSEMIDE 20 MG PO TABS
20.0000 mg | ORAL_TABLET | Freq: Every day | ORAL | Status: DC
  Administered 2021-12-07: 20 mg via ORAL
  Filled 2021-12-07: qty 1

## 2021-12-07 MED ORDER — ATORVASTATIN CALCIUM 80 MG PO TABS: 80.0000 mg | ORAL_TABLET | Freq: Every day | ORAL | 1 refills | Status: AC

## 2021-12-07 MED ORDER — OXYCODONE HCL 5 MG PO TABS: 15.0000 mg | ORAL_TABLET | Freq: Four times a day (QID) | ORAL | Status: DC

## 2021-12-07 MED ORDER — ASPIRIN 81 MG PO TBEC
81.0000 mg | DELAYED_RELEASE_TABLET | Freq: Every day | ORAL | 11 refills | Status: AC
  Filled 2021-12-07: qty 30, 30d supply, fill #0

## 2021-12-07 MED ORDER — SACUBITRIL-VALSARTAN 24-26 MG PO TABS
1.0000 | ORAL_TABLET | Freq: Two times a day (BID) | ORAL | 2 refills | Status: DC
Start: 2021-12-08 — End: 2022-03-30
  Filled 2021-12-07: qty 60, 30d supply, fill #0

## 2021-12-07 MED ORDER — SACUBITRIL-VALSARTAN 24-26 MG PO TABS: 1.0000 | ORAL_TABLET | Freq: Two times a day (BID) | ORAL | Status: DC

## 2021-12-07 NOTE — Plan of Care (Signed)
°  Problem: Education: Goal: Understanding of CV disease, CV risk reduction, and recovery process will improve Outcome: Progressing Goal: Individualized Educational Video(s) Outcome: Progressing   Problem: Activity: Goal: Ability to return to baseline activity level will improve Outcome: Progressing   Problem: Cardiovascular: Goal: Ability to achieve and maintain adequate cardiovascular perfusion will improve Outcome: Progressing Goal: Vascular access site(s) Level 0-1 will be maintained Outcome: Progressing   Problem: Health Behavior/Discharge Planning: Goal: Ability to safely manage health-related needs after discharge will improve Outcome: Progressing   Problem: Education: Goal: Knowledge of General Education information will improve Description: Including pain rating scale, medication(s)/side effects and non-pharmacologic comfort measures Outcome: Progressing   Problem: Health Behavior/Discharge Planning: Goal: Ability to manage health-related needs will improve Outcome: Progressing   Problem: Clinical Measurements: Goal: Ability to maintain clinical measurements within normal limits will improve Outcome: Progressing Goal: Will remain free from infection Outcome: Progressing Goal: Diagnostic test results will improve Outcome: Progressing Goal: Respiratory complications will improve Outcome: Progressing Goal: Cardiovascular complication will be avoided Outcome: Progressing   Problem: Activity: Goal: Risk for activity intolerance will decrease Outcome: Progressing   Problem: Nutrition: Goal: Adequate nutrition will be maintained Outcome: Progressing   Problem: Coping: Goal: Level of anxiety will decrease Outcome: Progressing   Problem: Elimination: Goal: Will not experience complications related to bowel motility Outcome: Progressing Goal: Will not experience complications related to urinary retention Outcome: Progressing   Problem: Pain Managment: Goal:  General experience of comfort will improve Outcome: Progressing   Problem: Safety: Goal: Ability to remain free from injury will improve Outcome: Progressing   Problem: Skin Integrity: Goal: Risk for impaired skin integrity will decrease Outcome: Progressing   Problem: Cardiovascular: Goal: Ability to achieve and maintain adequate cardiovascular perfusion will improve Outcome: Progressing Goal: Vascular access site(s) Level 0-1 will be maintained Outcome: Progressing

## 2021-12-07 NOTE — Discharge Summary (Addendum)
The patient has been seen in conjunction with Reino Bellis, NP. All aspects of care have been considered and discussed. The patient has been personally interviewed, examined, and all clinical data has been reviewed.  Please see the note from earlier this AM. Goal is guideline directed medical therapy for systolic HF. Next add on BB therapy ( can DC amlodipine to allow BP room) and SGLT-2.   Discharge Summary    Patient ID: Alexander Hatfield. MRN: [id number]; DOB: 1956-06-02  Admit date: 12/05/2021 Discharge date: 12/07/2021  PCP:  Celene Squibb, MD   [facility] Providers Cardiologist:  Jenkins Rouge, MD     Discharge Diagnoses    Principal Problem:   Acute diastolic heart failure [facility]) Active Problems:   Essential hypertension   Hyperlipidemia   CAD (coronary artery disease)  Diagnostic Studies/Procedures    Cath: 12/05/21    Mid RCA lesion is 75% stenosed.   RPDA lesion is 75% stenosed.   RPAV lesion is 70% stenosed.   Ost LAD to Prox LAD lesion is 80% stenosed.   Ost Cx lesion is 25% stenosed.   Mid LAD lesion is 40% stenosed.   LV end diastolic pressure is mildly elevated.   The left ventricular ejection fraction is 50-55% by visual estimate.   Hemodynamic findings consistent with mild pulmonary hypertension.   There is no aortic valve stenosis.   Aortic saturation 97%, PA saturation 73%, pulmonary artery pressure 45/25, mean PA pressure 34 mmHg, mean pulmonary capillary wedge pressure 22 mmHg, cardiac output 7.6 L/min, cardiac index 2.79.   Complicated situation in this patient with multiple comorbidities.  Ostial LAD lesion and RCA disease would best be treated with bypass surgery.  He does have lung cancer, morbid obesity and likely some lung disease causing intermittent hypoxemia.  He is volume overloaded.  Will diurese and start medical therapy.   Treatment of the ostial LAD would require stent placement in the left main jailing the  circumflex.  Diagnostic Dominance: Right  _____________   History of Present Illness     Alexander Hatfield. is a 66 y.o. male with history of hypertension, hyperlipidemia, severe obesity, PAD with 60% left external iliac artery on peripheral angiogram 02/18/2020.  Pain felt to be neuropathic and was seeing a neurosurgeon.  Also has stage IIIa non-small cell lung CA squamous cell carcinoma right upper lobe with mets to the left parotid.   Recent CT scan 10/26/21 showed three-vessel coronary atherosclerosis and a 4.4 cm ascending thoracic aorta needs yearly scanning and they wanted him to be seen. He complained of significant dyspnea just with standing and O2 drops to 81%. He saw pulmonary back in 2019 and wasn't able to due PFT's. He was frustrated to because he used to be so active. He's gained 15 lbs in the past month, 170 lbs in 12 yrs.  Wife sick and he can't cook. He has occasional right chest pain at rest-lasts a few minutes and goes away-like someone is pushing on him.   He was set up for outpatient Lexiscan which he did on 11/30/27 with mild to moderate ischemia. This was reviewed with Dr. Johnsie Cancel with recommendations for R/L [facility].    Hospital Course     CAD: Presented for outpatient cardiac catheterization with ostial LAD lesion of 80% as well as 75% mid RCA.  He was admitted and films were reviewed with interventional colleagues.  It was felt that without symptoms of angina best option would be  to treat medically as PCI of the LAD with jail left circumflex vessel.   -- on asa, statin and amlodipine   HFrEF: LVEF noted at 40-45% with global hypokinesis, mild LVH, G1DD. 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  -- recommendations for outpatient TEE to delineate interatrial septum and determine if there is right to left shunting with assumption of upright posture  HLD: atorvastatin increased to 8011maily  -- will need FLP/LFTs in 8 weeks  Patient was seen by Dr. SmiTamala Juliand deemed stable for discharge home. Follow up in the office has been arranged. [place] have an acute coronary syndrome (MI, NSTEMI, STEMI, etc) this admission?:  No                               Did the patient have a percutaneous coronary intervention (stent / angioplasty)?:  No.     _____________  Discharge Vitals Blood pressure (!) 155/82, pulse 78, temperature (!) 97.1 F (36.2 C), temperature source Axillary, resp. rate 20, height 6' 1" (1.854 m), weight (!) 158.6 kg, SpO2 92 %.  Filed Weights   12/05/21 0836 12/05/21 1558  Weight: (!) 157.9 kg (!) 158.6 kg    Labs & Radiologic Studies    CBC Recent Labs    12/05/21 1211 12/05/21 1615  WBC  --  10.0  HGB 15.3   16.0 15.3  HCT 45.0   47.0 48.4  MCV  --  95.3  PLT  --  [id number] Metabolic Panel Recent Labs    12/06/21 0357 12/07/21 0559  NA 138 141  K 3.7 3.9  CL 104 105  CO2 27 26  GLUCOSE 119* 113*  BUN 16 25*  CREATININE 1.09 1.23  CALCIUM 8.7* 8.5*   Liver Function Tests No results for input(s): AST, ALT, ALKPHOS, BILITOT, PROT, ALBUMIN in the last 72 hours. No results for input(s): LIPASE, AMYLASE in the last 72 hours. High Sensitivity Troponin:   No results for input(s): TROPONINIHS in the last 720 hours.  BNP Invalid input(s): POCBNP D-Dimer No results for input(s): DDIMER in the last 72 hours. Hemoglobin A1C No results for input(s): HGBA1C in the last 72 hours. Fasting Lipid Panel Recent Labs    12/07/21 0559  CHOL 135  HDL 36*  LDLCALC 64  TRIG 173*  CHOLHDL 3.8   Thyroid Function Tests No results for input(s):  TSH, T4TOTAL, T3FREE, THYROIDAB in the last 72 hours.  Invalid input(s): FREET3 _____________  CARDIAC CATHETERIZATION  Addendum Date: 12/05/2021     Mid RCA lesion is 75% stenosed.   RPDA lesion is 75% stenosed.   RPAV lesion is 70% stenosed.   Ost LAD to Prox LAD lesion is 80% stenosed.   Ost Cx lesion is 25% stenosed.   Mid LAD lesion is 40% stenosed.   LV end diastolic pressure is mildly elevated.   The left ventricular ejection fraction is 50-55% by visual estimate.   Hemodynamic findings consistent with mild pulmonary hypertension.   There is no aortic valve stenosis.   Aortic saturation 97%, PA saturation  73%, pulmonary artery pressure 45/25, mean PA pressure 34 mmHg, mean pulmonary capillary wedge pressure 22 mmHg, cardiac output 7.6 L/min, cardiac index 2.79. Complicated situation in this patient with multiple comorbidities.  Ostial LAD lesion and RCA disease would best be treated with bypass surgery.  He does have lung cancer, morbid obesity and likely some lung disease causing intermittent hypoxemia.  He is volume overloaded.  Will diurese and start medical therapy. Treatment of the ostial LAD would require stent placement in the left main jailing the circumflex.  Result Date: 12/05/2021   Mid RCA lesion is 75% stenosed.   RPDA lesion is 75% stenosed.   RPAV lesion is 70% stenosed.   Ost LAD to Prox LAD lesion is 80% stenosed.   Ost Cx lesion is 25% stenosed.   Mid LAD lesion is 40% stenosed.   LV end diastolic pressure is mildly elevated.   The left ventricular ejection fraction is 50-55% by visual estimate.   Hemodynamic findings consistent with mild pulmonary hypertension.   There is no aortic valve stenosis. Complicated situation in this patient with multiple comorbidities.  Ostial LAD lesion and RCA disease would best be treated with bypass surgery.  He does have lung cancer, morbid obesity and likely some lung disease causing intermittent hypoxemia.  He is volume overloaded.  Will diurese  and start medical therapy. Treatment of the ostial LAD would require stent placement in the left main jailing the circumflex.   NM Myocar Multi W/Spect W/Wall Motion / EF  Result Date: 11/28/2021   Findings are consistent with prior inferior myocardial infarction with mild to moderate peri-infarct ischemia. Low to intermediate risk   No ST deviation was noted.   LV perfusion is abnormal. Defect 1: There is a large defect with moderate reduction in uptake present in the apical to basal inferior location(s).There is mild to moderate reversibility. There is abnormal wall motion in the defect area.   Left ventricular function is normal. Nuclear stress EF: 57 %. The left ventricular ejection fraction is normal (55-65%). End diastolic cavity size is normal.   ECHOCARDIOGRAM COMPLETE BUBBLE STUDY  Result Date: 12/06/2021    ECHOCARDIOGRAM REPORT   Patient Name:   Alexander Hatfield. Date of Exam: 12/06/2021 Medical Rec #:  [id number]         Height:       73.0 in Accession #:    [id number]        Weight:       349.6 lb Date of Birth:  12/23/1955         BSA:          2.728 m Patient Age:    18 years          BP:           113/74 mmHg Patient Gender: M                 HR:           72 bpm. Exam Location:  Inpatient Procedure: 2D Echo, Cardiac Doppler, Color Doppler, Saline Contrast Bubble Study            and Intracardiac Opacification Agent Indications:    CHF  History:        Patient has no prior history of Echocardiogram examinations.  Sonographer:    Clayton Lefort RDCS (AE) Referring Phys: [place]  Sonographer Comments: Technically difficult study due to poor echo windows, suboptimal parasternal window, suboptimal apical window, suboptimal subcostal  window and patient is morbidly obese. Image acquisition challenging due to patient body habitus. IMPRESSIONS  1. Left ventricular ejection fraction, by estimation, is 40 to 45%. The left ventricle has mildly decreased function. The left ventricle demonstrates  global hypokinesis. The left ventricular internal cavity size was mildly dilated. There is mild left ventricular hypertrophy. Left ventricular diastolic parameters are consistent with Grade I diastolic dysfunction (impaired relaxation).  2. Right ventricular systolic function is normal. The right ventricular size is normal. Tricuspid regurgitation signal is inadequate for assessing PA pressure.  3. The mitral valve is normal in structure. No evidence of mitral valve regurgitation. No evidence of mitral stenosis.  4. The aortic valve is tricuspid. Aortic valve regurgitation is not visualized. Aortic valve sclerosis/calcification is present, without any evidence of aortic stenosis.  5. Aortic dilatation noted. There is mild dilatation of the aortic root and of the ascending aorta, measuring 42 mm. FINDINGS  Left Ventricle: Left ventricular ejection fraction, by estimation, is 40 to 45%. The left ventricle has mildly decreased function. The left ventricle demonstrates global hypokinesis. Definity contrast agent was given IV to delineate the left ventricular  endocardial borders. The left ventricular internal cavity size was mildly dilated. There is mild left ventricular hypertrophy. Left ventricular diastolic parameters are consistent with Grade I diastolic dysfunction (impaired relaxation). Right Ventricle: The right ventricular size is normal. No increase in right ventricular wall thickness. Right ventricular systolic function is normal. Tricuspid regurgitation signal is inadequate for assessing PA pressure. Left Atrium: Left atrial size was normal in size. Right Atrium: Right atrial size was normal in size. Pericardium: There is no evidence of pericardial effusion. Mitral Valve: The mitral valve is normal in structure. No evidence of mitral valve regurgitation. No evidence of mitral valve stenosis. Tricuspid Valve: The tricuspid valve is normal in structure. Tricuspid valve regurgitation is not demonstrated. Aortic  Valve: The aortic valve is tricuspid. Aortic valve regurgitation is not visualized. Aortic valve sclerosis/calcification is present, without any evidence of aortic stenosis. Aortic valve mean gradient measures 3.0 mmHg. Aortic valve peak gradient measures 4.7 mmHg. Aortic valve area, by VTI measures 6.57 cm. Pulmonic Valve: The pulmonic valve was normal in structure. Pulmonic valve regurgitation is not visualized. Aorta: Aortic dilatation noted. There is mild dilatation of the aortic root and of the ascending aorta, measuring 42 mm. Venous: The inferior vena cava was not well visualized. IAS/Shunts: No atrial level shunt detected by color flow Doppler. Agitated saline contrast was given intravenously to evaluate for intracardiac shunting.  LEFT VENTRICLE PLAX 2D LVIDd:         5.76 cm   Diastology LVIDs:         4.81 cm   LV e' medial:    10.30 cm/s LV PW:         1.21 cm   LV E/e' medial:  6.8 LV IVS:        1.30 cm   LV e' lateral:   10.10 cm/s LVOT diam:     2.60 cm   LV E/e' lateral: 6.9 LV SV:         116 LV SV Index:   43 LVOT Area:     5.31 cm  RIGHT VENTRICLE RV Basal diam:  3.61 cm RV S prime:     15.10 cm/s TAPSE (M-mode): 2.9 cm LEFT ATRIUM             Index LA diam:        4.60 cm 1.69 cm/m LA Vol (  A2C):   69.1 ml 25.33 ml/m LA Vol (A4C):   53.8 ml 19.72 ml/m LA Biplane Vol: 70.9 ml 25.99 ml/m  AORTIC VALVE AV Area (Vmax):    5.60 cm AV Area (Vmean):   5.23 cm AV Area (VTI):     6.57 cm AV Vmax:           108.00 cm/s AV Vmean:          77.600 cm/s AV VTI:            0.177 m AV Peak Grad:      4.7 mmHg AV Mean Grad:      3.0 mmHg LVOT Vmax:         114.00 cm/s LVOT Vmean:        76.500 cm/s LVOT VTI:          0.219 m LVOT/AV VTI ratio: 1.24  AORTA Ao Root diam: 4.20 cm Ao Asc diam:  4.10 cm MITRAL VALVE MV Area (PHT): 2.13 cm    SHUNTS MV Decel Time: 356 msec    Systemic VTI:  0.22 m MV E velocity: 69.80 cm/s  Systemic Diam: 2.60 cm MV A velocity: 72.80 cm/s MV E/A ratio:  0.96 Dalton McleanMD  Electronically signed by Franki Monte Signature Date/Time: 12/06/2021/6:20:39 PM    Final    Disposition   Pt is being discharged home today in good condition.  Follow-up Plans & Appointments     Follow-up Information     [facility] Follow up on 12/14/2021.   Specialty: Cardiology Why: please come in for repeat labs _0 :45pm Contact information: [address], [address] [place]        [name], MD Follow up on 12/28/2021.   Specialty: Cardiology Why: at 1pm for your follow up appt Contact information: [address] [address] [phone]                Discharge Instructions     (HEART FAILURE PATIENTS) Call MD:  Anytime you have any of the following symptoms: 1) 3 pound weight gain in 24 hours or 5 pounds in 1 week 2) shortness of breath, with or without a dry hacking cough 3) swelling in the hands, feet or stomach 4) if you have to sleep on extra pillows at night in order to breathe.   Complete by: As directed    Diet - low sodium heart healthy   Complete by: As directed    Increase activity slowly   Complete by: As directed        Discharge Medications   Allergies as of 12/07/2021       Reactions   Bee Venom Other (See Comments)   Dizzy/ swim head   Iohexol Other (See Comments)    Desc: PT STATES THAT WHEN GIVEN THE CONTRAST HIS LEGS BURN AND HE PASSES OUT, COMA FOR 7DAY. DR Thornton Papas WOULD NOT PRE MEDICATE DUE TO SEVERITY OF REACTION, STUDY WAS CX Isovue Preservative caused the problem        Medication List     STOP taking these medications    losartan 100 MG tablet Commonly known as: COZAAR   predniSONE 50 MG tablet Commonly known as: DELTASONE       TAKE these medications    albuterol 108 (90 Base) MCG/ACT inhaler Commonly known as: VENTOLIN HFA Inhale 2 puffs into the lungs every 6 (six) hours as needed for wheezing or  shortness of  breath.   amitriptyline 100 MG tablet Commonly known as: ELAVIL Take 100 mg by mouth at bedtime.   amLODipine 10 MG tablet Commonly known as: NORVASC Take 10 mg by mouth daily.   Aspirin Low Dose 81 MG EC tablet Generic drug: aspirin Take 1 tablet (81 mg total) by mouth daily. Swallow whole. Start taking on: December 08, 2021   atorvastatin 80 MG tablet Commonly known as: LIPITOR Take 1 tablet (80 mg total) by mouth at bedtime. What changed:  medication strength how much to take   baclofen 10 MG tablet Commonly known as: LIORESAL Take 10 mg by mouth 3 (three) times daily.   Breo Ellipta 200-25 MCG/ACT Aepb Generic drug: fluticasone furoate-vilanterol Inhale 1 puff into the lungs daily as needed (asthma).   diphenhydrAMINE 25 MG tablet Commonly known as: BENADRYL Take 25 mg by mouth every 6 (six) hours as needed for allergies.   Entresto 24-26 MG Generic drug: sacubitril-valsartan Take 1 tablet by mouth 2 (two) times daily. Start taking on: December 08, 2021   furosemide 20 MG tablet Commonly known as: LASIX Take 20 mg by mouth daily.   ipratropium-albuterol 0.5-2.5 (3) MG/3ML Soln Commonly known as: DUONEB Inhale 3 mLs into the lungs every 4 (four) hours as needed (shortness of breath).   meloxicam 15 MG tablet Commonly known as: MOBIC Take 15 mg by mouth daily.   metFORMIN 500 MG tablet Commonly known as: GLUCOPHAGE Take 500 mg by mouth daily.   Narcan 4 MG/0.1ML Liqd nasal spray kit Generic drug: naloxone Place 1 spray into the nose See admin instructions. Administer a single spray in one nostril upon signs of opioid overdose.  Call 911.  Repeat after 3 minutes if no response.   oxyCODONE 15 MG immediate release tablet Commonly known as: ROXICODONE Take 15 mg by mouth 4 (four) times daily.   potassium chloride SA 20 MEQ tablet Commonly known as: KLOR-CON M Take 20 mEq by mouth every evening.   pregabalin 200 MG capsule Commonly known as:  LYRICA Take 200 mg by mouth 2 (two) times daily.   prochlorperazine 10 MG tablet Commonly known as: COMPAZINE Take 1 tablet (10 mg total) by mouth every 6 (six) hours as needed for nausea or vomiting.   Vitamin D3 125 MCG (5000 UT) Tabs Take 5,000 Units by mouth once a week.         Outstanding Labs/Studies   BMET 2/2 FLP/LFTs in 8 weeks  Duration of Discharge Encounter   Greater than 30 minutes including physician time.  Signed, Reino Bellis, NP 12/07/2021, 1:26 PM

## 2021-12-07 NOTE — Plan of Care (Signed)
°  Problem: Cardiovascular: Goal: Ability to achieve and maintain adequate cardiovascular perfusion will improve Outcome: Progressing Goal: Vascular access site(s) Level 0-1 will be maintained Outcome: Progressing   Problem: Cardiovascular: Goal: Ability to achieve and maintain adequate cardiovascular perfusion will improve Outcome: Progressing Goal: Vascular access site(s) Level 0-1 will be maintained Outcome: Progressing   Problem: Pain Managment: Goal: General experience of comfort will improve Outcome: Progressing   Problem: Safety: Goal: Ability to remain free from injury will improve Outcome: Progressing   Problem: Skin Integrity: Goal: Risk for impaired skin integrity will decrease Outcome: Progressing

## 2021-12-07 NOTE — Progress Notes (Signed)
Explained discharge instructions to patient and his wife (via telephone). Removed IV and Telemetry monitoring. Called CCMD to notify that the patient was discharging home.   Reviewed all changes with patient's meds and upcoming appointments. Patient verbalized having an understanding. He has meds from [facility] in his possession. No further questions. Transporting down to the discharge lounge to await his ride.

## 2021-12-07 NOTE — Progress Notes (Signed)
And  Progress Note  Patient Name: Alexander Hatfield. Date of Encounter: 12/07/2021  [facility] Cardiologist: Jenkins Rouge, MD Estella Husk PA-C  Subjective   He feels the same.  We did document mild desaturation with standing noted in yesterday's chart.  Awaiting today's echo report with bubble study to look for evidence of right to left shunting with upright position.  Inpatient Medications    Scheduled Meds:  amitriptyline  100 mg Oral QHS   amLODipine  10 mg Oral Daily   aspirin EC  81 mg Oral Daily   atorvastatin  80 mg Oral QHS   baclofen  10 mg Oral TID   cholecalciferol  5,000 Units Oral Weekly   heparin  5,000 Units Subcutaneous Q8H   losartan  100 mg Oral Daily   meloxicam  15 mg Oral Daily   oxyCODONE  15 mg Oral QID   potassium chloride SA  20 mEq Oral QPM   pregabalin  200 mg Oral BID   sodium chloride flush  3 mL Intravenous Q12H   sodium chloride flush  3 mL Intravenous Q12H   Continuous Infusions:  sodium chloride     PRN Meds: sodium chloride, acetaminophen, diphenhydrAMINE, fluticasone furoate-vilanterol, Ipratropium-Albuterol, ondansetron (ZOFRAN) IV, prochlorperazine, sodium chloride flush   Vital Signs    Vitals:   12/06/21 1418 12/06/21 1657 12/06/21 2038 12/07/21 0512  BP: 128/83 123/76 129/79 117/71  Pulse: 69 73 87 61  Resp: 19 16 18 18   Temp:   97.9 F (36.6 C) 97.8 F (36.6 C)  TempSrc:   Oral Oral  SpO2: 93% 90% 92% 93%  Weight:      Height:        Intake/Output Summary (Last 24 hours) at 12/07/2021 1141 Last data filed at 12/07/2021 0531 Gross per 24 hour  Intake 640 ml  Output --  Net 640 ml   Last 3 Weights 12/05/2021 12/05/2021 11/27/2021  Weight (lbs) 349 lb 9.6 oz 348 lb 349 lb  Weight (kg) 158.578 kg 157.852 kg 158.305 kg      Telemetry    Normal sinus rhythm- Personally Reviewed  ECG    Not repeated at- Personally Reviewed  Physical Exam  Morbidly obese Radial cath site unremarkable GEN: No acute distress.    Neck: No JVD Cardiac: RRR, no murmurs, rubs, or gallops.  Respiratory: Clear to auscultation bilaterally. GI: Soft, nontender, non-distended  MS: No edema; No deformity. Neuro:  Nonfocal  Psych: Normal affect   Labs    High Sensitivity Troponin:  No results for input(s): TROPONINIHS in the last 720 hours.   Chemistry Recent Labs  Lab 12/01/21 1040 12/05/21 1204 12/05/21 1211 12/05/21 1615 12/06/21 0357 12/07/21 0559  NA 139   < > 143   143  --  138 141  K 4.7   < > 4.6   4.6  --  3.7 3.9  CL 102  --   --   --  104 105  CO2 26  --   --   --  27 26  GLUCOSE 130*  --   --   --  119* 113*  BUN 14  --   --   --  16 25*  CREATININE 1.11  --   --  0.86 1.09 1.23  CALCIUM 8.6*  --   --   --  8.7* 8.5*  GFRNONAA >60  --   --  >60 >60 >60  ANIONGAP 11  --   --   --  7 10   < > = values in this interval not displayed.    Lipids  Recent Labs  Lab 12/07/21 0559  CHOL 135  TRIG 173*  HDL 36*  LDLCALC 64  CHOLHDL 3.8    Hematology Recent Labs  Lab 12/01/21 1040 12/05/21 1204 12/05/21 1211 12/05/21 1615  WBC 8.4  --   --  10.0  RBC 4.81  --   --  5.08  HGB 14.6 16.0 15.3   16.0 15.3  HCT 49.1 47.0 45.0   47.0 48.4  MCV 102.1*  --   --  95.3  MCH 30.4  --   --  30.1  MCHC 29.7*  --   --  31.6  RDW 14.6  --   --  14.5  PLT 223  --   --  188   Thyroid No results for input(s): TSH, FREET4 in the last 168 hours.  BNPNo results for input(s): BNP, PROBNP in the last 168 hours.  DDimer No results for input(s): DDIMER in the last 168 hours.   Radiology    CARDIAC CATHETERIZATION  Addendum Date: 12/05/2021     Mid RCA lesion is 75% stenosed.   RPDA lesion is 75% stenosed.   RPAV lesion is 70% stenosed.   Ost LAD to Prox LAD lesion is 80% stenosed.   Ost Cx lesion is 25% stenosed.   Mid LAD lesion is 40% stenosed.   LV end diastolic pressure is mildly elevated.   The left ventricular ejection fraction is 50-55% by visual estimate.   Hemodynamic findings consistent with mild  pulmonary hypertension.   There is no aortic valve stenosis.   Aortic saturation 97%, PA saturation 73%, pulmonary artery pressure 45/25, mean PA pressure 34 mmHg, mean pulmonary capillary wedge pressure 22 mmHg, cardiac output 7.6 L/min, cardiac index 2.79. Complicated situation in this patient with multiple comorbidities.  Ostial LAD lesion and RCA disease would best be treated with bypass surgery.  He does have lung cancer, morbid obesity and likely some lung disease causing intermittent hypoxemia.  He is volume overloaded.  Will diurese and start medical therapy. Treatment of the ostial LAD would require stent placement in the left main jailing the circumflex.  Result Date: 12/05/2021   Mid RCA lesion is 75% stenosed.   RPDA lesion is 75% stenosed.   RPAV lesion is 70% stenosed.   Ost LAD to Prox LAD lesion is 80% stenosed.   Ost Cx lesion is 25% stenosed.   Mid LAD lesion is 40% stenosed.   LV end diastolic pressure is mildly elevated.   The left ventricular ejection fraction is 50-55% by visual estimate.   Hemodynamic findings consistent with mild pulmonary hypertension.   There is no aortic valve stenosis. Complicated situation in this patient with multiple comorbidities.  Ostial LAD lesion and RCA disease would best be treated with bypass surgery.  He does have lung cancer, morbid obesity and likely some lung disease causing intermittent hypoxemia.  He is volume overloaded.  Will diurese and start medical therapy. Treatment of the ostial LAD would require stent placement in the left main jailing the circumflex.   ECHOCARDIOGRAM COMPLETE BUBBLE STUDY  Result Date: 12/06/2021    ECHOCARDIOGRAM REPORT   Patient Name:   Alexander Hatfield. Date of Exam: 12/06/2021 Medical Rec #:  [id number]         Height:       73.0 in Accession #:    [id number]  Weight:       349.6 lb Date of Birth:  [birth year]         BSA:          2.728 m Patient Age:    66 years          BP:           113/74 mmHg Patient Gender: M                  HR:           72 bpm. Exam Location:  Inpatient Procedure: 2D Echo, Cardiac Doppler, Color Doppler, Saline Contrast Bubble Study            and Intracardiac Opacification Agent Indications:    CHF  History:        Patient has no prior history of Echocardiogram examinations.  Sonographer:    Clayton Lefort RDCS (AE) Referring Phys: [place]  Sonographer Comments: Technically difficult study due to poor echo windows, suboptimal parasternal window, suboptimal apical window, suboptimal subcostal window and patient is morbidly obese. Image acquisition challenging due to patient body habitus. IMPRESSIONS  1. Left ventricular ejection fraction, by estimation, is 40 to 45%. The left ventricle has mildly decreased function. The left ventricle demonstrates global hypokinesis. The left ventricular internal cavity size was mildly dilated. There is mild left ventricular hypertrophy. Left ventricular diastolic parameters are consistent with Grade I diastolic dysfunction (impaired relaxation).  2. Right ventricular systolic function is normal. The right ventricular size is normal. Tricuspid regurgitation signal is inadequate for assessing PA pressure.  3. The mitral valve is normal in structure. No evidence of mitral valve regurgitation. No evidence of mitral stenosis.  4. The aortic valve is tricuspid. Aortic valve regurgitation is not visualized. Aortic valve sclerosis/calcification is present, without any evidence of aortic stenosis.  5. Aortic dilatation noted. There is mild dilatation of the aortic root and of the ascending aorta, measuring 42 mm. FINDINGS  Left Ventricle: Left ventricular ejection fraction, by estimation, is 40 to 45%. The left ventricle has mildly decreased function. The left ventricle demonstrates global hypokinesis. Definity contrast agent was given IV to delineate the left ventricular  endocardial borders. The left ventricular internal cavity size was mildly dilated. There is mild  left ventricular hypertrophy. Left ventricular diastolic parameters are consistent with Grade I diastolic dysfunction (impaired relaxation). Right Ventricle: The right ventricular size is normal. No increase in right ventricular wall thickness. Right ventricular systolic function is normal. Tricuspid regurgitation signal is inadequate for assessing PA pressure. Left Atrium: Left atrial size was normal in size. Right Atrium: Right atrial size was normal in size. Pericardium: There is no evidence of pericardial effusion. Mitral Valve: The mitral valve is normal in structure. No evidence of mitral valve regurgitation. No evidence of mitral valve stenosis. Tricuspid Valve: The tricuspid valve is normal in structure. Tricuspid valve regurgitation is not demonstrated. Aortic Valve: The aortic valve is tricuspid. Aortic valve regurgitation is not visualized. Aortic valve sclerosis/calcification is present, without any evidence of aortic stenosis. Aortic valve mean gradient measures 3.0 mmHg. Aortic valve peak gradient measures 4.7 mmHg. Aortic valve area, by VTI measures 6.57 cm. Pulmonic Valve: The pulmonic valve was normal in structure. Pulmonic valve regurgitation is not visualized. Aorta: Aortic dilatation noted. There is mild dilatation of the aortic root and of the ascending aorta, measuring 42 mm. Venous: The inferior vena cava was not well visualized. IAS/Shunts: No atrial level shunt detected by color flow Doppler.  Agitated saline contrast was given intravenously to evaluate for intracardiac shunting.  LEFT VENTRICLE PLAX 2D LVIDd:         5.76 cm   Diastology LVIDs:         4.81 cm   LV e' medial:    10.30 cm/s LV PW:         1.21 cm   LV E/e' medial:  6.8 LV IVS:        1.30 cm   LV e' lateral:   10.10 cm/s LVOT diam:     2.60 cm   LV E/e' lateral: 6.9 LV SV:         116 LV SV Index:   43 LVOT Area:     5.31 cm  RIGHT VENTRICLE RV Basal diam:  3.61 cm RV S prime:     15.10 cm/s TAPSE (M-mode): 2.9 cm LEFT  ATRIUM             Index LA diam:        4.60 cm 1.69 cm/m LA Vol (A2C):   69.1 ml 25.33 ml/m LA Vol (A4C):   53.8 ml 19.72 ml/m LA Biplane Vol: 70.9 ml 25.99 ml/m  AORTIC VALVE AV Area (Vmax):    5.60 cm AV Area (Vmean):   5.23 cm AV Area (VTI):     6.57 cm AV Vmax:           108.00 cm/s AV Vmean:          77.600 cm/s AV VTI:            0.177 m AV Peak Grad:      4.7 mmHg AV Mean Grad:      3.0 mmHg LVOT Vmax:         114.00 cm/s LVOT Vmean:        76.500 cm/s LVOT VTI:          0.219 m LVOT/AV VTI ratio: 1.24  AORTA Ao Root diam: 4.20 cm Ao Asc diam:  4.10 cm MITRAL VALVE MV Area (PHT): 2.13 cm    SHUNTS MV Decel Time: 356 msec    Systemic VTI:  0.22 m MV E velocity: 69.80 cm/s  Systemic Diam: 2.60 cm MV A velocity: 72.80 cm/s MV E/A ratio:  0.96 Dalton McleanMD Electronically signed by Franki Monte Signature Date/Time: 12/06/2021/6:20:39 PM    Final     Cardiac Studies   Cardiac catheterization 12/06/2021: Conclusion      Mid RCA lesion is 75% stenosed.   RPDA lesion is 75% stenosed.   RPAV lesion is 70% stenosed.   Ost LAD to Prox LAD lesion is 80% stenosed.   Ost Cx lesion is 25% stenosed.   Mid LAD lesion is 40% stenosed.   LV end diastolic pressure is mildly elevated.   The left ventricular ejection fraction is 50-55% by visual estimate.   Hemodynamic findings consistent with mild pulmonary hypertension.   There is no aortic valve stenosis.   Aortic saturation 97%, PA saturation 73%, pulmonary artery pressure 45/25, mean PA pressure 34 mmHg, mean pulmonary capillary wedge pressure 22 mmHg, cardiac output 7.6 L/min, cardiac index 2.79  Diagnostic Dominance: Right      Patient Profile     66 y.o. male with coronary artery disease without angina based upon three-vessel coronary calcification on CT scan, obstructive sleep apnea, ascending aortic aneurysm, primary hypertension, hyperlipidemia, severe obesity, PAD with 60% left external iliac artery on peripheral angiogram  02/18/2020 , stage IIIa non-small cell lung  CA squamous cell carcinoma right upper lobe with mets to the left parotid, ascending aortic aneurysm and 3-year and history of positional dyspnea and desaturation with standing.  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  Obesity caused limited echo quality.  May need transesophageal echo to delineate interatrial septum and determine if there is right to left shunting with assumption of upright posture.  This will need to be done as an outpatient.   Coronary artery disease without angina pectoris: Moderate CAD in the mid to distal RCA and ostial LAD but without symptoms of angina.  Continue medical therapy and risk factor modification. Chronic combined systolic and diastolic heart failure: Needs guideline directed quadruple therapy if possible.  We will discontinue losartan start Entresto 24/26 mg p.o. twice daily.  Needs follow-up basic metabolic panel in 1 week.  Will subsequently need to have SGLT2 therapy started.  Continue outpatient furosemide 20 mg/day.  Consider discontinuation of amlodipine to allow use of MRA and beta-blocker therapy.  Follow-up with primary cardiologist in 1 week with basic metabolic panel and further up titration of  Okay to discharge today.  For questions or updates, please contact [place] Please consult [email or web address] for contact info under        Signed, Sinclair Grooms, MD  12/07/2021, 11:41 AM

## 2021-12-07 NOTE — Progress Notes (Signed)
Mobility Specialist Progress Note    12/07/21 1221  Mobility  Bed Position Chair  Activity Ambulated with assistance in hallway  Level of Assistance Contact guard assist, steadying assist  Assistive Device Cane  Distance Ambulated (ft) 120 ft  Activity Response Tolerated fair  $Mobility charge 1 Mobility   During Mobility: 119 HR, 84% SpO2 Post-Mobility: 107 HR, 134/95 BP, 94% SpO2  Pt received in bed and agreeable. C/o SOB with exertion and desatted into low-mid 80s on RA, RN notified. Pt left on RA satting in low-mid 90s. C/o knee pain. Left in chair with call bell in reach.   [facility] Mobility Specialist  M.S. 2C and 6E: [phone] M.S. 4E: [phone]

## 2021-12-11 ENCOUNTER — Telehealth: Payer: Self-pay | Admitting: Cardiovascular Disease

## 2021-12-11 DIAGNOSIS — R0609 Other forms of dyspnea: Secondary | ICD-10-CM | POA: Diagnosis not present

## 2021-12-11 NOTE — Telephone Encounter (Signed)
Notified wife Ronnie Doo that pt still needs to wear monitor. Wife voiced understanding.

## 2021-12-11 NOTE — Telephone Encounter (Signed)
Patient's wife calling to find out if the patient still needs to wear the heart monitor, because he had the procedure done and they said he would just need medication.

## 2021-12-14 ENCOUNTER — Other Ambulatory Visit (HOSPITAL_COMMUNITY)
Admission: RE | Admit: 2021-12-14 | Discharge: 2021-12-14 | Disposition: A | Discharge status: Home / Self Care | Payer: Medicare Other | Source: Ambulatory Visit | Attending: Internal Medicine | Admitting: Internal Medicine

## 2021-12-14 ENCOUNTER — Other Ambulatory Visit (HOSPITAL_COMMUNITY)
Admission: RE | Admit: 2021-12-14 | Discharge: 2021-12-14 | Disposition: A | Discharge status: Home / Self Care | Payer: Medicare Other | Source: Ambulatory Visit | Attending: Cardiovascular Disease | Admitting: Cardiovascular Disease

## 2021-12-14 ENCOUNTER — Other Ambulatory Visit: Payer: Medicare Other

## 2021-12-14 DIAGNOSIS — E1140 Type 2 diabetes mellitus with diabetic neuropathy, unspecified: Secondary | ICD-10-CM | POA: Insufficient documentation

## 2021-12-14 DIAGNOSIS — Z79899 Other long term (current) drug therapy: Secondary | ICD-10-CM | POA: Insufficient documentation

## 2021-12-14 LAB — CBC WITH DIFFERENTIAL/PLATELET
Abs Immature Granulocytes: 0.04 K/uL (ref 0.00–0.07)
Basophils Absolute: 0.1 K/uL (ref 0.0–0.1)
Basophils Relative: 1 %
Eosinophils Absolute: 0.3 K/uL (ref 0.0–0.5)
Eosinophils Relative: 3 %
HCT: 50.7 % (ref 39.0–52.0)
Hemoglobin: 15.8 g/dL (ref 13.0–17.0)
Immature Granulocytes: 1 %
Lymphocytes Relative: 17 %
Lymphs Abs: 1.4 K/uL (ref 0.7–4.0)
MCH: 29.5 pg (ref 26.0–34.0)
MCHC: 31.2 g/dL (ref 30.0–36.0)
MCV: 94.8 fL (ref 80.0–100.0)
Monocytes Absolute: 0.7 K/uL (ref 0.1–1.0)
Monocytes Relative: 8 %
Neutro Abs: 5.9 K/uL (ref 1.7–7.7)
Neutrophils Relative %: 70 %
Platelets: 249 K/uL (ref 150–400)
RBC: 5.35 MIL/uL (ref 4.22–5.81)
RDW: 14.6 % (ref 11.5–15.5)
WBC: 8.4 K/uL (ref 4.0–10.5)
nRBC: 0 % (ref 0.0–0.2)

## 2021-12-14 LAB — COMPREHENSIVE METABOLIC PANEL
ALT: 19 U/L (ref 0–44)
AST: 17 U/L (ref 15–41)
Albumin: 4.2 g/dL (ref 3.5–5.0)
Alkaline Phosphatase: 104 U/L (ref 38–126)
Anion gap: 7 (ref 5–15)
BUN: 11 mg/dL (ref 8–23)
CO2: 25 mmol/L (ref 22–32)
Calcium: 9.1 mg/dL (ref 8.9–10.3)
Chloride: 105 mmol/L (ref 98–111)
Creatinine, Ser: 0.77 mg/dL (ref 0.61–1.24)
GFR, Estimated: >60 mL/min (ref >60)
Glucose, Bld: 113 mg/dL — ABNORMAL HIGH (ref 70–99)
Potassium: 4.1 mmol/L (ref 3.5–5.1)
Sodium: 137 mmol/L (ref 135–145)
Total Bilirubin: 0.5 mg/dL (ref 0.3–1.2)
Total Protein: 7.7 g/dL (ref 6.5–8.1)

## 2021-12-14 LAB — LIPID PANEL
Cholesterol: 151 mg/dL (ref 0–200)
HDL: 39 mg/dL — ABNORMAL LOW (ref >40)
LDL Cholesterol: 87 mg/dL (ref 0–99)
Total CHOL/HDL Ratio: 3.9 RATIO
Triglycerides: 126 mg/dL (ref <150)
VLDL: 25 mg/dL (ref 0–40)

## 2021-12-14 LAB — HEMOGLOBIN A1C
Hgb A1c MFr Bld: 6.1 % — ABNORMAL HIGH (ref 4.8–5.6)
Mean Plasma Glucose: 128.37 mg/dL

## 2021-12-22 NOTE — Progress Notes (Signed)
Cardiology Office Note    Date:  12/22/2021   ID:  Alexander Hatfield., DOB 10/07/1956, MRN [id number]   PCP:  Celene Squibb, MD   [place]  Cardiologist:  Jenkins Rouge, MD   Advanced Practice Provider:  No care team member to display Electrophysiologist:  None     History of Present Illness:  Alexander Hatfield. is a 66 y.o. male with history of hypertension, hyperlipidemia, severe obesity, PAD with 60% left external iliac artery on peripheral angiogram 02/18/2020.  Pain felt to be neuropathic and was seeing a neurosurgeon.  Also has stage IIIa non-small cell lung CA squamous cell carcinoma right upper lobe with mets to the left parotid.  CT scan 10/26/21 showed three-vessel coronary atherosclerosis and a 4.4 cm ascending thoracic aorta needs yearly scanning and they wanted him to be seen. He complains of significant dyspnea just with standing and O2 drops to 81%. He saw pulmonary back in 2019 and wasn't able to due PFT's. He is frustrated to because he used to be so active. He's gained 15 lbs in the past month, 170 lbs in 12 yrs.   Myovue ordered by PA 11/28/21 abnormal ? Inferior infarct with moderate peri infarct ischemia Set up for right and left heart cath done by Dr Tamala Julian 12/05/21   Conclusion      Mid RCA lesion is 75% stenosed.   RPDA lesion is 75% stenosed.   RPAV lesion is 70% stenosed.   Ost LAD to Prox LAD lesion is 80% stenosed.   Ost Cx lesion is 25% stenosed.   Mid LAD lesion is 40% stenosed.   LV end diastolic pressure is mildly elevated.   The left ventricular ejection fraction is 50-55% by visual estimate.   Hemodynamic findings consistent with mild pulmonary hypertension.   There is no aortic valve stenosis.   Aortic saturation 97%, PA saturation 73%, pulmonary artery pressure 45/25, mean PA pressure 34 mmHg, mean pulmonary capillary wedge pressure 22 mmHg, cardiac output 7.6 L/min, cardiac index 2.79.   Complicated situation in this  patient with multiple comorbidities.  Ostial LAD lesion and RCA disease would best be treated with bypass surgery.  He does have lung cancer, morbid obesity and likely some lung disease causing intermittent hypoxemia.  He is volume overloaded.  Will diurese and start medical therapy.   Treatment of the ostial LAD would require stent placement in the left main jailing the circumflex.  Conclusion      Mid RCA lesion is 75% stenosed.   RPDA lesion is 75% stenosed.   RPAV lesion is 70% stenosed.   Ost LAD to Prox LAD lesion is 80% stenosed.   Ost Cx lesion is 25% stenosed.   Mid LAD lesion is 40% stenosed.   LV end diastolic pressure is mildly elevated.   The left ventricular ejection fraction is 50-55% by visual estimate.   Hemodynamic findings consistent with mild pulmonary hypertension.   There is no aortic valve stenosis.   Aortic saturation 97%, PA saturation 73%, pulmonary artery pressure 45/25, mean PA pressure 34 mmHg, mean pulmonary capillary wedge pressure 22 mmHg, cardiac output 7.6 L/min, cardiac index 2.79.   Complicated situation in this patient with multiple comorbidities.  Ostial LAD lesion and RCA disease would best be treated with bypass surgery.  He does have lung cancer, morbid obesity and likely some lung disease causing intermittent hypoxemia.  He is volume overloaded.  Will diurese and start medical therapy.  Treatment of the ostial LAD would require stent placement in the left main jailing the circumflex.  He continues to have dyspnea Using his wifes oxygen at times [facility] at rest No chest pain   Past Medical History:  Diagnosis Date   Arthritis    Chronic back pain    Chronic back pain    Headache    HOH (hard of hearing)    Hypertension    Lung mass    Pre-diabetes    Sleep apnea    wears cpap   Wears glasses    Wears partial dentures     Past Surgical History:  Procedure Laterality Date   ABDOMINAL AORTOGRAM W/LOWER EXTREMITY Right 02/18/2020   Procedure:  ABDOMINAL AORTOGRAM W/LOWER EXTREMITY;  Surgeon: Lorretta Harp, MD;  Location: [facility];  Service: Cardiovascular;  Laterality: Right;   ANTERIOR CERVICAL DECOMP/DISCECTOMY FUSION N/A 10/27/2018   Procedure: ANTERIOR CERVICAL THREE-FOUR, FOUR-FIVE DECOMPRESSION/DISCECTOMY FUSION TWO LEVELS;  Surgeon: Kary Kos, MD;  Location: [facility];  Service: Neurosurgery;  Laterality: N/A;   ANTERIOR CERVICAL THREE-FOUR, FOUR-FIVE DECOMPRESSION/DISCECTOMY FUSION TWO LEVELS   BACK SURGERY     IR IMAGING GUIDED PORT INSERTION  11/27/2018   MULTIPLE TOOTH EXTRACTIONS     RIGHT/LEFT HEART CATH AND CORONARY ANGIOGRAPHY N/A 12/05/2021   Procedure: RIGHT/LEFT HEART CATH AND CORONARY ANGIOGRAPHY;  Surgeon: Jettie Booze, MD;  Location: [facility];  Service: Cardiovascular;  Laterality: N/A;   SPINAL CORD STIMULATOR IMPLANT     VIDEO BRONCHOSCOPY N/A 10/29/2018   Procedure: VIDEO BRONCHOSCOPY;  Surgeon: Laurin Coder, MD;  [facility];  Service: Thoracic;  Laterality: N/A;   VIDEO BRONCHOSCOPY WITH ENDOBRONCHIAL ULTRASOUND N/A 10/27/2018   Procedure: VIDEO BRONCHOSCOPY WITH ENDOBRONCHIAL ULTRASOUND;  Surgeon: Laurin Coder, MD;  [facility]: [facility];  Service: Thoracic;  Laterality: N/A;   VIDEO BRONCHOSCOPY WITH ENDOBRONCHIAL ULTRASOUND N/A 10/29/2018   Procedure: VIDEO BRONCHOSCOPY WITH ENDOBRONCHIAL ULTRASOUND;  Surgeon: Laurin Coder, MD;  [facility];  Service: Thoracic;  Laterality: N/A;    Current Medications: No outpatient medications have been marked as taking for the 12/28/21 encounter (Appointment) with [name], MD.     Allergies:   Bee venom and Iohexol   Social History   Socioeconomic History   Marital status: Married    Spouse name: Not on file   Number of children: Not on file   Years of education: Not on file   Highest education level: Not on file  Occupational History   Not on file  Tobacco Use   Smoking status: Former    Types:  Cigarettes   Smokeless tobacco: Never   Tobacco comments:    Quit smoking cigarettes 15 years ago  Vaping Use   Vaping Use: Never used  Substance and Sexual Activity   Alcohol use: No   Drug use: No   Sexual activity: Not Currently  Other Topics Concern   Not on file  Social History Narrative   Lives with wife with MS.    Social Determinants of Health   Financial Resource Strain: Not on file  Food Insecurity: Not on file  Transportation Needs: Not on file  Physical Activity: Not on file  Stress: Not on file  Social Connections: Not on file     Family History:  The patient's  family history includes Lung disease in his father; Lupus in his mother.   ROS:   Please see the history of present illness.  ROS All other systems reviewed and are negative.   PHYSICAL EXAM:   VS:  There were no vitals taken for this visit.  Physical Exam  GEN: Obese, in no acute distress  Neck: no JVD, carotid bruits, or masses Cardiac:RRR; no murmurs, rubs, or gallops  Respiratory:  decrease breath sounds with scattered wheezing. GI: soft, nontender, nondistended, + BS Ext: without cyanosis, clubbing, or edema, Good distal pulses bilaterally Neuro:  Alert and Oriented x 3 Psych: euthymic mood, full affect  Wt Readings from Last 3 Encounters:  12/05/21 (!) 349 lb 9.6 oz (158.6 kg)  11/27/21 (!) 349 lb (158.3 kg)  08/29/20 (!) 321 lb 12.8 oz (146 kg)      Studies/Labs Reviewed:   EKG:  EKG is  ordered today.  The ekg ordered today demonstrates sinus tachycardia 112/m otherwise normal  Recent Labs: 12/14/2021: ALT 19; BUN 11; Creatinine, Ser 0.77; Hemoglobin 15.8; Platelets 249; Potassium 4.1; Sodium 137   Lipid Panel    Component Value Date/Time   CHOL 151 12/14/2021 1237   TRIG 126 12/14/2021 1237   HDL 39 (L) 12/14/2021 1237   CHOLHDL 3.9 12/14/2021 1237   VLDL 25 12/14/2021 1237   LDLCALC 87 12/14/2021 1237    Additional studies/ records that were reviewed today include:   CT of the chest 10/26/2021 IMPRESSION: 1. 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 2. No evidence of recurrent metastatic disease in the chest. Mildly enlarged right subcarinal and right paratracheal lymph nodes are stable. 3. Stable dilated main pulmonary artery, suggesting chronic pulmonary arterial hypertension. 4. Stable dilated 4.4 cm ascending thoracic aorta. Recommend annual imaging followup by CTA or MRA. This recommendation follows 2010 ACCF/AHA/AATS/ACR/ASA/SCA/SCAI/SIR/STS/SVM Guidelines for the Diagnosis and Management of Patients with Thoracic Aortic Disease. Circulation. 2010; 121: L875-I433. Aortic aneurysm NOS (ICD10-I71.9). 5. Three-vessel coronary atherosclerosis. 6. Mild diffuse hepatic steatosis. 7. Aortic Atherosclerosis (ICD10-I70.0).    FINDINGS: Cardiovascular: Normal heart size. No significant pericardial effusion/thickening. Three-vessel coronary atherosclerosis. Right internal jugular Port-A-Cath terminates at the cavoatrial junction. Atherosclerotic thoracic aorta with dilated 4.4 cm ascending thoracic aorta, stable using similar measurement technique. Stable dilated main pulmonary artery (3.7 cm diameter).     PLAN:   CAD:  Has surgical RCA and ostial LAD disease that would require stenting across the LM. Comorbidities regarding metastatic lung cancer and morbid obesity with poor functional status makes CABG prohibitive  Review by interventionalist including DR Tamala Julian felt medical Rx really only option  No chest pain  CHF:  filling pressures ok at cath. EF 40-45% on entresto and lasix don't think CHF playing a major role in his dyspnea  Dyspnea: multifactorial with morbid obesity lung cancer, CHF No shunt at cath with PA sat 73% No shunt on TTE with bubbles CT done 10/29/21 with stable radiation fibrosis in superior right perihilar region Stage 3A non-small cell sqaumous cell cancer  with hypermetabolic lesion in left parotid Has had chemo/XRT and consolidative Rx with Imfinzi immunotherapy Needs f/u with oncology Dr Maryellen Pile and referral to pulmonary PFT/DLCO ? Sleep study  Aortic Aneursym:  4.4 cm on CT BP well controlled  PVD:  Angio Dr Gwenlyn Found 02/18/20 with 60% left EI stenosis no intervention pain thought to be more neuropathic from back issues and neuropathy  HLD:  continue statin  DM:  Discussed low carb diet.  Target hemoglobin A1c is 6.5 or less.  Continue current medications.   PFT;s / DLCO ordered F/U Dr Elsworth Soho March  F/U Cardiology 6 months   I had not seen patient in 2 years Time to review his recent hospitalization echo , cath, xrays discuss with Dr Tamala Julian interview, exam and composing note 60 minutes   Signed, Jenkins Rouge, MD  12/22/2021 5:04 PM    [address], [place], [place]  [address] Phone: [phone]; Fax: [phone]

## 2021-12-25 ENCOUNTER — Other Ambulatory Visit (HOSPITAL_COMMUNITY): Payer: Medicare Other

## 2021-12-28 ENCOUNTER — Other Ambulatory Visit: Payer: Self-pay

## 2021-12-28 ENCOUNTER — Ambulatory Visit (INDEPENDENT_AMBULATORY_CARE_PROVIDER_SITE_OTHER): Payer: Medicare Other | Admitting: Cardiovascular Disease

## 2021-12-28 ENCOUNTER — Encounter: Payer: Self-pay | Admitting: Cardiovascular Disease

## 2021-12-28 VITALS — BP 102/60 | HR 83 | Ht 73.0 in | Wt 354.0 lb

## 2021-12-28 DIAGNOSIS — I2510 Atherosclerotic heart disease of native coronary artery without angina pectoris: Secondary | ICD-10-CM

## 2021-12-28 DIAGNOSIS — E785 Hyperlipidemia, unspecified: Secondary | ICD-10-CM

## 2021-12-28 DIAGNOSIS — I10 Essential (primary) hypertension: Secondary | ICD-10-CM | POA: Diagnosis not present

## 2021-12-28 DIAGNOSIS — I739 Peripheral vascular disease, unspecified: Secondary | ICD-10-CM | POA: Diagnosis not present

## 2021-12-28 DIAGNOSIS — I712 Thoracic aortic aneurysm, without rupture, unspecified: Secondary | ICD-10-CM

## 2021-12-28 DIAGNOSIS — R0609 Other forms of dyspnea: Secondary | ICD-10-CM | POA: Diagnosis not present

## 2021-12-28 NOTE — Patient Instructions (Signed)
Medication Instructions:  Your physician recommends that you continue on your current medications as directed. Please refer to the Current Medication list given to you today.  *If you need a refill on your cardiac medications before your next appointment, please call your pharmacy*   Lab Work: NONE   If you have labs (blood work) drawn today and your tests are completely normal, you will receive your results only by: [place] (if you have MyChart) OR A paper copy in the mail If you have any lab test that is abnormal or we need to change your treatment, we will call you to review the results.   Testing/Procedures: Your physician has recommended that you have a pulmonary function test. Pulmonary Function Tests are a group of tests that measure how well air moves in and out of your lungs.    Follow-Up: At [facility], you and your health needs are our priority.  As part of our continuing mission to provide you with exceptional heart care, we have created designated Provider Care Teams.  These Care Teams include your primary Cardiologist (physician) and Advanced Practice Providers (APPs -  Physician Assistants and Nurse Practitioners) who all work together to provide you with the care you need, when you need it.  We recommend signing up for the patient portal called "MyChart".  Sign up information is provided on this After Visit Summary.  MyChart is used to connect with patients for Virtual Visits (Telemedicine).  Patients are able to view lab/test results, encounter notes, upcoming appointments, etc.  Non-urgent messages can be sent to your provider as well.   To learn more about what you can do with MyChart, go to [email or web address].    Your next appointment:   6 month(s)  The format for your next appointment:   In Person  Provider:   Jenkins Rouge, MD    Other Instructions Thank you for choosing [facility]!

## 2021-12-29 ENCOUNTER — Telehealth: Payer: Self-pay

## 2021-12-29 NOTE — Telephone Encounter (Signed)
Pts wife, [name], [place] expressing frustration stating his Cardiologist stated there is cancer progression and wants to know why Dr. Julien Nordmann has not addressed it.  I have called the pts wife back to obtain clarification. There was no answer at [phone] and no VM picked up. I was unable to LM.

## 2022-01-02 ENCOUNTER — Telehealth: Payer: Self-pay

## 2022-01-02 NOTE — Telephone Encounter (Signed)
Alexander Hatfield with [employer] LM advising the pt told her that pts Cardiologist told him his cancer progressed and she wanted to obtain some clarification regarding this.  I have called Alexander Hatfield back and advised that pt has been on observation since 01/2020 and seen every six months. His last OV with the CC was 10/31/21 and last CT scan was 10/26/21. During this OV with Dr. Julien Nordmann he reviewed his CT scan and was advised his scan showed no concerning findings for disease recurrence and it was recommended for the pt to continue on observation with a repeat CT scan of the chest in 6 months (June 2023).   I shared that for pts CAD seen on the scan and the persistent shortness of breath, we referred him to Cardiology in [place] for evaluation and management of his condition. I also advised that during pts 03/27/21 appt he reported an abnormal sound with inspiration and was recommended to follow-up with his PCP to evaluate him so he can be appropriately managed or referred to the ENT or PULM.  Ms. Alexander Hatfield expressed understanding of this information.

## 2022-01-10 ENCOUNTER — Telehealth: Payer: Self-pay | Admitting: *Deleted

## 2022-01-10 MED ORDER — CARVEDILOL 3.125 MG PO TABS: 3.1250 mg | ORAL_TABLET | Freq: Two times a day (BID) | ORAL | 3 refills | Status: DC

## 2022-01-10 NOTE — Telephone Encounter (Signed)
Call placed to pt regarding message below.  Spoke with wife, Alexander Hatfield, [place] on file. ? ?She has been aware of pt's monitor results and that pt will need to start Coreg 3.125 bid. ? ?Prescription has been sent to [facility], per request.  ?

## 2022-01-10 NOTE — Telephone Encounter (Signed)
-----   Message from Imogene Burn, PA-C sent at 01/10/2022  2:43 PM EST ----- ?Anderson Malta, Pam's off today can you call in Coreg 3.125 mg bid on this patient for rapid heart beats on monitor? ?Thanks ?----- Message ----- ?From: [name], MD ?Sent: 01/09/2022   1:37 PM EST ?To: Imogene Burn, PA-C, Michaelyn Barter, RN ? ?Can start coreg 3.125 bid ?----- Message ----- ?From: Imogene Burn, PA-C ?Sent: 01/09/2022  12:10 PM EST ?To: [name], MD, Imogene Burn, PA-C ? ?Can you look at this patient's monitor? He just saw you 12/28/21. Not on BB or CCB had NSVT and SVT can't rule out Aflutter. I'm thinking he may feel better if we treat this? Let me know your thoughts. Complicated patient. Thanks, ?Selinda Eon ?----- Message ----- ?From: [name], MD ?Sent: 01/09/2022  12:00 PM EST ?To: Imogene Burn, PA-C ? ? ? ? ?

## 2022-01-18 ENCOUNTER — Ambulatory Visit (INDEPENDENT_AMBULATORY_CARE_PROVIDER_SITE_OTHER): Payer: Medicare Other | Admitting: Pulmonary Disease

## 2022-01-18 ENCOUNTER — Other Ambulatory Visit: Payer: Self-pay

## 2022-01-18 ENCOUNTER — Encounter: Payer: Self-pay | Admitting: Pulmonary Disease

## 2022-01-18 VITALS — BP 136/88 | HR 74 | Temp 98.4°F | Ht 73.0 in | Wt 324.0 lb

## 2022-01-18 DIAGNOSIS — J449 Chronic obstructive pulmonary disease, unspecified: Secondary | ICD-10-CM

## 2022-01-18 DIAGNOSIS — C3491 Malignant neoplasm of unspecified part of right bronchus or lung: Secondary | ICD-10-CM

## 2022-01-18 DIAGNOSIS — R0902 Hypoxemia: Secondary | ICD-10-CM | POA: Diagnosis not present

## 2022-01-18 DIAGNOSIS — J9611 Chronic respiratory failure with hypoxia: Secondary | ICD-10-CM

## 2022-01-18 DIAGNOSIS — G4733 Obstructive sleep apnea (adult) (pediatric): Secondary | ICD-10-CM

## 2022-01-18 DIAGNOSIS — I2510 Atherosclerotic heart disease of native coronary artery without angina pectoris: Secondary | ICD-10-CM

## 2022-01-18 NOTE — Assessment & Plan Note (Signed)
Unclear degree of COPD.  He was unable to perform PFTs in the past. ?We will try to reschedule. ?Breo and albuterol has not really helped him and we will provide him with Anoro instead ?He will call us back for prescription of this works ?

## 2022-01-18 NOTE — Progress Notes (Signed)
? ?Subjective:  ? ? Patient ID: Alexander Klingerman., male    DOB: January 25, 1956, 66 y.o.   MRN: [id number] ? ?HPI ? ?Chief Complaint  ?Patient presents with  ? Consult  ?  Consult for SOB, and pulmonary fibrosis. Patient says sometimes his O2 drops to low 80s.  ? ?66 year old remote smoker, morbidly obese presents for evaluation of dyspnea and breathing issues. ?I last saw him in 2019 when he was referred preop for neck surgery and was found to have right upper lobe lung mass, we did EBUS that showed stage III squamous cell lung cancer, since then he has undergone chemoradiation followed by immunotherapy and appears to be in remission based on last CT from 10/26/2021 ?He reports dyspnea on walking a few steps.  He admits to being a couch potato over the last 3 years while undergoing cancer treatment.  He has neuropathy in his legs which also limits his walking.  Detailed cardiac evaluation was reviewed including Myoview left heart cath, apparently he is not a candidate for CABG or PCI ?He feels that Breo or albuterol has not helped him.  He was unable to do PFTs in the past.  He denies frequent flareups. ?He smoked about 25 pack years and quit in 2004 ? ?He was diagnosed with moderate OSA in 2018 but has not used his CPAP for the last few years. ? ?He arrives in a wheelchair accompanied by his friend who corroborates history, Adekunle seems to have poor recollection of his medical problems and a detailed chart review was performed ? ?PMH -  ?Stage IIIA (T2b, N2, M0) non-small cell lung cancer, squamous cell carcinoma presented with right upper lobe lung mass + right paratracheal lymphadenopathy s/p concurrent chemoradiation + Immunotherapy ? ?HFrEF -EF 40 to 45% ?3V CAD -not a candidate for CABG or PCI per cardiology ? ?PAD -60% blockage ? ?Peripheral neuropathy causing chronic leg pain ? ? ?Significant tests/ events reviewed ? ?CT chest without contrast 11/2020 right upper lobe fibrosis, stable lymphadenopathy, 4.4 cm  ascending aortic aneurysm ? ?07/2017 NPSG-309 pounds-AHI 20/hour lowest desaturation 75%, corrected by CPAP 9 cm ? ? ? ?Past Medical History:  ?Diagnosis Date  ? Arthritis   ? Chronic back pain   ? Chronic back pain   ? Headache   ? HOH (hard of hearing)   ? Hypertension   ? Lung mass   ? Pre-diabetes   ? Sleep apnea   ? wears cpap  ? Wears glasses   ? Wears partial dentures   ? ? ?Past Surgical History:  ?Procedure Laterality Date  ? ABDOMINAL AORTOGRAM W/LOWER EXTREMITY Right 02/18/2020  ? Procedure: ABDOMINAL AORTOGRAM W/LOWER EXTREMITY;  Surgeon: Lorretta Harp, MD;  Location: [facility];  Service: Cardiovascular;  Laterality: Right;  ? ANTERIOR CERVICAL DECOMP/DISCECTOMY FUSION N/A 10/27/2018  ? Procedure: ANTERIOR CERVICAL THREE-FOUR, FOUR-FIVE DECOMPRESSION/DISCECTOMY FUSION TWO LEVELS;  Surgeon: Kary Kos, MD;  Location: [facility];  Service: Neurosurgery;  Laterality: N/A;   ANTERIOR CERVICAL THREE-FOUR, FOUR-FIVE DECOMPRESSION/DISCECTOMY FUSION TWO LEVELS  ? BACK SURGERY    ? IR IMAGING GUIDED PORT INSERTION  11/27/2018  ? MULTIPLE TOOTH EXTRACTIONS    ? RIGHT/LEFT HEART CATH AND CORONARY ANGIOGRAPHY N/A 12/05/2021  ? Procedure: RIGHT/LEFT HEART CATH AND CORONARY ANGIOGRAPHY;  Surgeon: Jettie Booze, MD;  Location: [facility];  Service: Cardiovascular;  Laterality: N/A;  ? SPINAL CORD STIMULATOR IMPLANT    ? VIDEO BRONCHOSCOPY N/A 10/29/2018  ? Procedure: VIDEO BRONCHOSCOPY;  Surgeon:  Laurin Coder, MD;  [facility];  Service: Thoracic;  Laterality: N/A;  ? VIDEO BRONCHOSCOPY WITH ENDOBRONCHIAL ULTRASOUND N/A 10/27/2018  ? Procedure: VIDEO BRONCHOSCOPY WITH ENDOBRONCHIAL ULTRASOUND;  Surgeon: Laurin Coder, MD;  Location: [facility];  Service: Thoracic;  Laterality: N/A;  ? VIDEO BRONCHOSCOPY WITH ENDOBRONCHIAL ULTRASOUND N/A 10/29/2018  ? Procedure: VIDEO BRONCHOSCOPY WITH ENDOBRONCHIAL ULTRASOUND;  Surgeon: Laurin Coder, MD;  Location: [facility];  Service: Thoracic;   Laterality: N/A;  ? ? ?Allergies  ?Allergen Reactions  ? Bee Venom Other (See Comments)  ?  Dizzy/ swim head  ? Iohexol Other (See Comments)  ?   Desc: PT STATES THAT WHEN GIVEN THE CONTRAST HIS LEGS BURN AND HE PASSES OUT, COMA FOR 7DAY. DR [facility] Papas WOULD NOT PRE MEDICATE DUE TO SEVERITY OF REACTION, STUDY WAS CX ? ?Isovue Preservative caused the problem ? ?  ? ? ?Social History  ? ?Socioeconomic History  ? Marital status: Married  ?  Spouse name: Not on file  ? Number of children: Not on file  ? Years of education: Not on file  ? Highest education level: Not on file  ?Occupational History  ? Not on file  ?Tobacco Use  ? Smoking status: Former  ?  Types: Cigarettes  ? Smokeless tobacco: Never  ? Tobacco comments:  ?  Quit smoking cigarettes 15 years ago  ?Vaping Use  ? Vaping Use: Never used  ?Substance and Sexual Activity  ? Alcohol use: No  ? Drug use: No  ? Sexual activity: Not Currently  ?Other Topics Concern  ? Not on file  ?Social History Narrative  ? Lives with wife with MS.   ? ?Social Determinants of Health  ? ?Financial Resource Strain: Not on file  ?Food Insecurity: Not on file  ?Transportation Needs: Not on file  ?Physical Activity: Not on file  ?Stress: Not on file  ?Social Connections: Not on file  ?Intimate Partner Violence: Not on file  ? ? ?Family History  ?Problem Relation Age of Onset  ? Lupus Mother   ? Lung disease Father   ? ? ? ?Review of Systems ? ?Dyspnea ?Lower extremity edema ?Pain in both legs ? ?Constitutional: negative for anorexia, fevers and sweats  ?Eyes: negative for irritation, redness and visual disturbance  ?Ears, nose, mouth, throat, and face: negative for earaches, epistaxis, nasal congestion and sore throat  ?Respiratory: negative for cough, sputum and wheezing  ?Cardiovascular: negative for chest pain,  orthopnea, palpitations and syncope  ?Gastrointestinal: negative for abdominal pain, constipation, diarrhea, melena, nausea and vomiting  ?Genitourinary:negative for dysuria,  frequency and hematuria  ?Hematologic/lymphatic: negative for bleeding, easy bruising and lymphadenopathy  ?Musculoskeletal:negative for arthralgias, muscle weakness a ?Neurological: negative for coordination problems, gait problems, headaches and weakness  ?Endocrine: negative for diabetic symptoms including polydipsia, polyuria and weight loss ? ?   ?Objective:  ? Physical Exam ? ?Gen. Pleasant, obese, in no distress, normal affect ?ENT - no pallor,icterus, no post nasal drip, class 2-3 airway ?Neck: No JVD, no thyromegaly, no carotid bruits ?Lungs: no use of accessory muscles, no dullness to percussion, decreased without rales or rhonchi  ?Cardiovascular: Rhythm regular, heart sounds  normal, no murmurs or gallops, no peripheral edema ?Abdomen: soft and non-tender, no hepatosplenomegaly, BS normal. ?Musculoskeletal: No deformities, no cyanosis or clubbing ?Neuro:  alert, non focal, no tremors ? ? ? ?   ?Assessment & Plan:  ? ?Alexander Hatfield seems to have been caught on the cycle of obesity and conditioning.  This  is in addition to underlying problems with radiation fibrosis posttreatment for lung cancer and possibly underlying COPD.  He also has severe CAD and chronic systolic heart failure ? ? ?Assessment:  ? ? 1 or more chronic illnesses with severe exacerbation, ?progression, or side effects of treatment; ? ? 1 acute or chronic illness or injury that poses a threat to ?life or bodily function -chronic hypoxic respiratory failure ? ?Plan Following Extensive Data Review & Interpretation:  ? I reviewed prior external note(s) from cardiology ? I reviewed the result(s) of CT chest, Myoview, heart cath, ? I have ordered PFTs, pulmonary rehab ? ?Independent interpretation of tests ? Review of patient's CT chest images revealed changes of right upper lobe radiation fibrosis. The patient's images have been independently reviewed by me.   ? ?Discussion of management or test interpretation with another colleague PCP and  cardiology.  ? ? ?

## 2022-01-18 NOTE — Assessment & Plan Note (Signed)
We will provide him with 2 L POC ?We will refer him to pulmonary rehab ?He is limited by peripheral neuropathy but is able to get on his bike so I am hopeful that he will be able to perform the required exercises and improve his conditioning ?

## 2022-01-18 NOTE — Assessment & Plan Note (Signed)
I have asked Hoby to get back on his CPAP machine at 9 cm which he has.  He already has supplies ?Weight loss encouraged, compliance with goal of at least 4-6 hrs every night is the expectation. ?Advised against medications with sedative side effects ?Cautioned against driving when sleepy - understanding that sleepiness will vary on a day to day basis ? ?

## 2022-01-18 NOTE — Patient Instructions (Signed)
X Amb sat ? ?X PFTs  ? ?X Trial of Anoro once daily ? ?X referral to pulmonary rehab ? ?Get back on your CPAP machine ?

## 2022-01-18 NOTE — Addendum Note (Signed)
Addended by: Fritzi Mandes D on: 01/18/2022 03:34 PM ? ? Modules accepted: Orders ? ?

## 2022-01-18 NOTE — Addendum Note (Signed)
Addended by: Fritzi Mandes D on: 01/18/2022 11:36 AM ? ? Modules accepted: Orders ? ?

## 2022-01-18 NOTE — Assessment & Plan Note (Signed)
Appears to be in remission ?CT chest was reviewed, surveillance CT per oncology ?

## 2022-03-02 ENCOUNTER — Other Ambulatory Visit (HOSPITAL_COMMUNITY): Payer: Medicare Other

## 2022-03-06 ENCOUNTER — Ambulatory Visit (HOSPITAL_COMMUNITY)
Admission: RE | Admit: 2022-03-06 | Discharge: 2022-03-06 | Disposition: A | Discharge status: Home / Self Care | Payer: Medicare Other | Source: Ambulatory Visit | Attending: Cardiovascular Disease | Admitting: Cardiovascular Disease

## 2022-03-06 DIAGNOSIS — R0609 Other forms of dyspnea: Secondary | ICD-10-CM | POA: Insufficient documentation

## 2022-03-06 LAB — PULMONARY FUNCTION TEST
DL/VA % pred: 101 %
DL/VA: 4.11 ml/min/mmHg/L
DLCO unc % pred: 76 %
DLCO unc: 22.08 ml/min/mmHg
FEF 25-75 Post: 1.66 L/sec
FEF 25-75 Pre: 1.43 L/sec
FEF2575-%Change-Post: 16 %
FEF2575-%Pred-Post: 57 %
FEF2575-%Pred-Pre: 49 %
FEV1-%Change-Post: 4 %
FEV1-%Pred-Post: 63 %
FEV1-%Pred-Pre: 60 %
FEV1-Post: 2.38 L
FEV1-Pre: 2.29 L
FEV1FVC-%Change-Post: -2 %
FEV1FVC-%Pred-Pre: 93 %
FEV6-%Change-Post: 5 %
FEV6-%Pred-Post: 72 %
FEV6-%Pred-Pre: 68 %
FEV6-Post: 3.49 L
FEV6-Pre: 3.3 L
FEV6FVC-%Change-Post: -1 %
FEV6FVC-%Pred-Post: 103 %
FEV6FVC-%Pred-Pre: 105 %
FVC-%Change-Post: 7 %
FVC-%Pred-Post: 70 %
FVC-%Pred-Pre: 65 %
FVC-Post: 3.54 L
FVC-Pre: 3.3 L
Post FEV1/FVC ratio: 67 %
Post FEV6/FVC ratio: 99 %
Pre FEV1/FVC ratio: 69 %
Pre FEV6/FVC Ratio: 100 %

## 2022-03-06 MED ORDER — ALBUTEROL SULFATE (2.5 MG/3ML) 0.083% IN NEBU
2.5000 mg | INHALATION_SOLUTION | Freq: Once | RESPIRATORY_TRACT | Status: AC
  Administered 2022-03-06: 2.5 mg via RESPIRATORY_TRACT

## 2022-03-14 ENCOUNTER — Encounter (HOSPITAL_COMMUNITY)
Admission: RE | Admit: 2022-03-14 | Discharge: 2022-03-14 | Disposition: A | Discharge status: Home / Self Care | Payer: Medicare Other | Source: Ambulatory Visit | Attending: Pulmonary Disease | Admitting: Pulmonary Disease

## 2022-03-14 ENCOUNTER — Telehealth: Payer: Self-pay | Admitting: Pulmonary Disease

## 2022-03-14 ENCOUNTER — Encounter (HOSPITAL_COMMUNITY): Payer: Self-pay

## 2022-03-14 VITALS — BP 102/64 | HR 68 | Ht 71.0 in | Wt 344.6 lb

## 2022-03-14 DIAGNOSIS — J449 Chronic obstructive pulmonary disease, unspecified: Secondary | ICD-10-CM | POA: Insufficient documentation

## 2022-03-14 LAB — GLUCOSE, CAPILLARY: Glucose-Capillary: 99 mg/dL (ref 70–99)

## 2022-03-14 NOTE — Progress Notes (Signed)
Saturation Qualifications  ? ? ?Patient saturations on room air at rest: 89% ? ?Patient saturations on room air while ambulating: 86% ? ?Patient saturations while on 2 L of oxygen while ambulating: 93% ? ?Please briefly explain why patient needs home oxygen: Pt requires 2 L of supplemental oxygen to maintain oxygen saturations >88% while ambulating.  ? ?Alexander Hatfield, M.S., ACSM CEP ? ? ? ?

## 2022-03-14 NOTE — Progress Notes (Signed)
Pulmonary Individual Treatment Plan ? ?Patient Details  ?Name: Alexander Hatfield. ?MRN: [id number] ?Date of Birth: 07-Mar-1956 ?Referring Provider:   ?Flowsheet Row PULMONARY REHAB COPD ORIENTATION from 03/14/2022 in [facility]  ?Referring Provider Dr. Elsworth Soho  ? ?  ? ? ?Initial Encounter Date:  ?Flowsheet Row PULMONARY REHAB COPD ORIENTATION from 03/14/2022 in [facility]  ?Date 03/14/22  ? ?  ? ? ?Visit Diagnosis: COPD with chronic bronchitis and emphysema ([place]) ? ?Patient's Home Medications on Admission:  ? ?Current Outpatient Medications:  ?  albuterol (VENTOLIN HFA) 108 (90 Base) MCG/ACT inhaler, Inhale 2 puffs into the lungs every 6 (six) hours as needed for wheezing or shortness of breath., Disp: , Rfl:  ?  amitriptyline (ELAVIL) 100 MG tablet, Take 100 mg by mouth at bedtime., Disp: , Rfl:  ?  amLODipine (NORVASC) 10 MG tablet, Take 10 mg by mouth daily. , Disp: , Rfl:  ?  aspirin 81 MG EC tablet, Take 1 tablet (81 mg total) by mouth daily. Swallow whole., Disp: 30 tablet, Rfl: 11 ?  atorvastatin (LIPITOR) 80 MG tablet, Take 1 tablet (80 mg total) by mouth at bedtime., Disp: 90 tablet, Rfl: 1 ?  baclofen (LIORESAL) 10 MG tablet, Take 10 mg by mouth 3 (three) times daily., Disp: , Rfl:  ?  BREO ELLIPTA 200-25 MCG/INH AEPB, Inhale 1 puff into the lungs daily as needed (asthma)., Disp: , Rfl:  ?  Cholecalciferol (VITAMIN D3) 125 MCG (5000 UT) TABS, Take 5,000 Units by mouth once a week., Disp: , Rfl:  ?  furosemide (LASIX) 20 MG tablet, Take 20 mg by mouth daily. , Disp: , Rfl:  ?  ipratropium-albuterol (DUONEB) 0.5-2.5 (3) MG/3ML SOLN, Inhale 3 mLs into the lungs every 4 (four) hours as needed (shortness of breath). , Disp: , Rfl:  ?  meloxicam (MOBIC) 15 MG tablet, Take 15 mg by mouth daily. , Disp: , Rfl:  ?  metFORMIN (GLUCOPHAGE) 500 MG tablet, Take 500 mg by mouth daily., Disp: , Rfl:  ?  NARCAN 4 MG/0.1ML LIQD nasal spray kit, Place 1 spray into the nose See admin  instructions. Administer a single spray in one nostril upon signs of opioid overdose.  Call 911.  Repeat after 3 minutes if no response., Disp: , Rfl:  ?  oxyCODONE (ROXICODONE) 15 MG immediate release tablet, Take 15 mg by mouth 4 (four) times daily. , Disp: , Rfl:  ?  potassium chloride SA (K-DUR,KLOR-CON) 20 MEQ tablet, Take 20 mEq by mouth every evening. , Disp: , Rfl:  ?  pregabalin (LYRICA) 200 MG capsule, Take 200 mg by mouth 2 (two) times daily., Disp: , Rfl:  ?  sacubitril-valsartan (ENTRESTO) 24-26 MG, Take 1 tablet by mouth 2 (two) times daily., Disp: 60 tablet, Rfl: 2 ?  carvedilol (COREG) 3.125 MG tablet, Take 1 tablet (3.125 mg total) by mouth 2 (two) times daily. (Patient not taking: Reported on 03/13/2022), Disp: 180 tablet, Rfl: 3 ? ?Past Medical History: ?Past Medical History:  ?Diagnosis Date  ? Arthritis   ? Chronic back pain   ? Chronic back pain   ? Headache   ? HOH (hard of hearing)   ? Hypertension   ? Lung mass   ? Pre-diabetes   ? Sleep apnea   ? wears cpap  ? Wears glasses   ? Wears partial dentures   ? ? ?Tobacco Use: ?Social History  ? ?Tobacco Use  ?Smoking Status Former  ? Types: Cigarettes  ?  Smokeless Tobacco Never  ?Tobacco Comments  ? Quit smoking cigarettes 15 years ago  ? ? ?Labs: ?Review Flowsheet   ? ?  ?  Latest Ref Rng & Units 11/16/2008 06/30/2017 12/05/2021 12/07/2021  ?Labs for ITP Cardiac and Pulmonary Rehab  ?Cholestrol 0 - 200 mg/dL    135    ?LDL (calc) 0 - 99 mg/dL    64    ?HDL-C >40 mg/dL    36    ?Trlycerides <150 mg/dL    173    ?Hemoglobin A1c 4.8 - 5.6 %    6.7    ?PH, Arterial 7.350 - 7.450 7.358   7.301   7.344     ?PCO2 arterial 32.0 - 48.0 mmHg 47.6   61.4   50.8     ?Bicarbonate 20.0 - 28.0 mmol/L 26.1   25.9   27.7    ? 28.4    ? 27.7     ?TCO2 22 - 32 mmol/L 23._0 ? 30    ? 29     ?O2 Saturation % 90.9   89.5   72.0    ? 73.0    ? 97.0     ? ?  12/14/2021  ?Labs for ITP Cardiac and Pulmonary Rehab  ?Cholestrol 151    ?LDL (calc) 87    ?HDL-C 39     ?Trlycerides 126    ?Hemoglobin A1c 6.1    ?PH, Arterial   ?PCO2 arterial   ?Bicarbonate   ?TCO2   ?O2 Saturation   ?  ? ? Multiple values from one day are sorted in reverse-chronological order  ?  ?  ? ? ?Capillary Blood Glucose: ?Lab Results  ?Component Value Date  ? GLUCAP 99 03/14/2022  ? GLUCAP 171 (H) 12/05/2021  ? GLUCAP 105 (H) 08/18/2020  ? GLUCAP 78 11/27/2018  ? GLUCAP 135 (H) 10/27/2018  ? ? ? ?Pulmonary Assessment Scores: ? Pulmonary Assessment Scores   ? ? [place] Name 03/14/22 1430  ?  ?  ?  ? ADL UCSD  ? ADL Phase Entry    ? SOB Score total 64    ? Rest 1    ? Walk 4    ? Stairs 5    ? Bath 1    ? Dress 3    ? Shop 4    ?  ? CAT Score  ? CAT Score 19    ?  ? mMRC Score  ? mMRC Score 4    ? ?  ?  ? ?  ? ?UCSD: ?Self-administered rating of dyspnea associated with activities of daily living (ADLs) ?6-point scale (0 = "not at all" to 5 = "maximal or unable to do because of breathlessness")  ?Scoring Scores range from 0 to 120.  Minimally important difference is 5 units ? ?CAT: ?CAT can identify the health impairment of COPD patients and is better correlated with disease progression.  ?CAT has a scoring range of zero to 40. The CAT score is classified into four groups of low (less than 10), medium (10 - 20), high (21-30) and very high (31-40) based on the impact level of disease on health status. A CAT score over 10 suggests significant symptoms.  A worsening CAT score could be explained by an exacerbation, poor medication adherence, poor inhaler technique, or progression of COPD or comorbid conditions.  ?CAT MCID is 2 points ? ?mMRC: ?mMRC (Modified Medical Research Council) Dyspnea Scale is used  to assess the degree of baseline functional disability in patients of respiratory disease due to dyspnea. ?No minimal important difference is established. A decrease in score of 1 point or greater is considered a positive change.  ? ?Pulmonary Function Assessment: ? Pulmonary Function Assessment - 03/14/22 1433    ? ?  ? Post Bronchodilator Spirometry Results  ? FEV1% 63 %   ? FEV1/FVC Ratio 67   ?  ? Breath  ? Shortness of Breath Limiting activity;Yes   ? ?  ?  ? ?  ? ? ?Exercise Target Goals: ?Exercise Program Goal: ?Individual exercise prescription set using results from initial 6 min walk test and THRR while considering  patient?s activity barriers and safety.  ? ?Exercise Prescription Goal: ?Initial exercise prescription builds to 30-45 minutes a day of aerobic activity, 2-3 days per week.  Home exercise guidelines will be given to patient during program as part of exercise prescription that the participant will acknowledge. ? ?Activity Barriers & Risk Stratification: ? Activity Barriers & Cardiac Risk Stratification - 03/14/22 1246   ? ?  ? Activity Barriers & Cardiac Risk Stratification  ? Activity Barriers Back Problems;Arthritis;Deconditioning;Shortness of Breath;Balance Concerns;History of Falls;Assistive Device   ? Cardiac Risk Stratification High   ? ?  ?  ? ?  ? ? ?6 Minute Walk: ? 6 Minute Walk   ? ? [place] Name 03/14/22 1409  ?  ?  ?  ? 6 Minute Walk  ? Phase Initial    ? Distance 600 feet    ? Walk Time 6 minutes    ? # of Rest Breaks 0    ? MPH 1.13    ? METS 0.81    ? RPE 13    ? Perceived Dyspnea  12    ? VO2 Peak 2.85    ? Symptoms Yes (comment)    ? Comments LB and leg pain after walking for 3 minutes 6/10    ? Resting HR 68 bpm    ? Resting BP 102/64    ? Resting Oxygen Saturation  89 %    ? Exercise Oxygen Saturation  during 6 min walk 90 %    ? Max Ex. HR 112 bpm    ? Max Ex. BP 146/80    ? 2 Minute Post BP 132/74    ?  ? Interval HR  ? 1 Minute HR 68    ? 2 Minute HR 81    ? 3 Minute HR 92    ? 4 Minute HR 100    ? 5 Minute HR 106    ? 6 Minute HR 108    ? 2 Minute Post HR 83    ? Interval Heart Rate? Yes    ?  ? Interval Oxygen  ? Interval Oxygen? Yes    ? Baseline Oxygen Saturation % 89 %    ? 1 Minute Oxygen Saturation % 95 %    ? 1 Minute Liters of Oxygen 2 L    ? 2 Minute Oxygen Saturation % 94 %     ? 2 Minute Liters of Oxygen 2 L    ? 3 Minute Oxygen Saturation % 91 %    ? 3 Minute Liters of Oxygen 2 L    ? 4 Minute Oxygen Saturation % 90 %    ? 4 Minute Liters of Oxygen 2 L    ? 5 Minute Oxygen Saturation

## 2022-03-15 NOTE — Telephone Encounter (Signed)
Called and spoke to wife Alexander Hatfield (ok per dpr) Explained that PFT does not determine if patient can come off of O2 or not and patient should continue recommendations from last OV. She voiced understanding and states patient does not like O2. Explained we may can walk him in the office during his appt on 5/24 to see if he can maintain O2 sats above 90% while on room air if Dr. Elsworth Soho is ok with that. She states she will tell patient and have him let us know during appt. Nothing further needed at this time.  ?

## 2022-03-27 ENCOUNTER — Other Ambulatory Visit: Payer: Self-pay

## 2022-03-27 ENCOUNTER — Emergency Department (HOSPITAL_COMMUNITY): Payer: Medicare Other

## 2022-03-27 ENCOUNTER — Inpatient Hospital Stay (HOSPITAL_COMMUNITY)
Admission: EM | Admit: 2022-03-27 | Discharge: 2022-03-30 | DRG: 189 | Disposition: A | Discharge status: Home / Self Care | Payer: Medicare Other | Attending: Internal Medicine | Admitting: Internal Medicine

## 2022-03-27 ENCOUNTER — Encounter (HOSPITAL_COMMUNITY): Payer: Self-pay

## 2022-03-27 DIAGNOSIS — G8929 Other chronic pain: Secondary | ICD-10-CM | POA: Diagnosis present

## 2022-03-27 DIAGNOSIS — J9602 Acute respiratory failure with hypercapnia: Secondary | ICD-10-CM | POA: Diagnosis not present

## 2022-03-27 DIAGNOSIS — M1990 Unspecified osteoarthritis, unspecified site: Secondary | ICD-10-CM | POA: Diagnosis present

## 2022-03-27 DIAGNOSIS — I10 Essential (primary) hypertension: Secondary | ICD-10-CM | POA: Diagnosis present

## 2022-03-27 DIAGNOSIS — I110 Hypertensive heart disease with heart failure: Secondary | ICD-10-CM | POA: Diagnosis present

## 2022-03-27 DIAGNOSIS — Z6841 Body Mass Index (BMI) 40.0 and over, adult: Secondary | ICD-10-CM | POA: Diagnosis not present

## 2022-03-27 DIAGNOSIS — R0602 Shortness of breath: Secondary | ICD-10-CM

## 2022-03-27 DIAGNOSIS — M7989 Other specified soft tissue disorders: Secondary | ICD-10-CM | POA: Diagnosis present

## 2022-03-27 DIAGNOSIS — J9601 Acute respiratory failure with hypoxia: Secondary | ICD-10-CM | POA: Diagnosis not present

## 2022-03-27 DIAGNOSIS — J441 Chronic obstructive pulmonary disease with (acute) exacerbation: Secondary | ICD-10-CM | POA: Diagnosis present

## 2022-03-27 DIAGNOSIS — Z85118 Personal history of other malignant neoplasm of bronchus and lung: Secondary | ICD-10-CM

## 2022-03-27 DIAGNOSIS — M549 Dorsalgia, unspecified: Secondary | ICD-10-CM | POA: Diagnosis present

## 2022-03-27 DIAGNOSIS — E119 Type 2 diabetes mellitus without complications: Secondary | ICD-10-CM | POA: Diagnosis present

## 2022-03-27 DIAGNOSIS — Z20822 Contact with and (suspected) exposure to covid-19: Secondary | ICD-10-CM | POA: Diagnosis present

## 2022-03-27 DIAGNOSIS — Z981 Arthrodesis status: Secondary | ICD-10-CM | POA: Diagnosis not present

## 2022-03-27 DIAGNOSIS — Z791 Long term (current) use of non-steroidal anti-inflammatories (NSAID): Secondary | ICD-10-CM | POA: Diagnosis not present

## 2022-03-27 DIAGNOSIS — G9341 Metabolic encephalopathy: Secondary | ICD-10-CM | POA: Diagnosis present

## 2022-03-27 DIAGNOSIS — H9190 Unspecified hearing loss, unspecified ear: Secondary | ICD-10-CM | POA: Diagnosis present

## 2022-03-27 DIAGNOSIS — E8729 Other acidosis: Secondary | ICD-10-CM | POA: Diagnosis present

## 2022-03-27 DIAGNOSIS — G9340 Encephalopathy, unspecified: Secondary | ICD-10-CM

## 2022-03-27 DIAGNOSIS — Z923 Personal history of irradiation: Secondary | ICD-10-CM

## 2022-03-27 DIAGNOSIS — Z9221 Personal history of antineoplastic chemotherapy: Secondary | ICD-10-CM

## 2022-03-27 DIAGNOSIS — C3490 Malignant neoplasm of unspecified part of unspecified bronchus or lung: Secondary | ICD-10-CM | POA: Diagnosis present

## 2022-03-27 DIAGNOSIS — Z7982 Long term (current) use of aspirin: Secondary | ICD-10-CM

## 2022-03-27 DIAGNOSIS — Z91030 Bee allergy status: Secondary | ICD-10-CM

## 2022-03-27 DIAGNOSIS — Z9682 Presence of neurostimulator: Secondary | ICD-10-CM

## 2022-03-27 DIAGNOSIS — I2510 Atherosclerotic heart disease of native coronary artery without angina pectoris: Secondary | ICD-10-CM | POA: Diagnosis present

## 2022-03-27 DIAGNOSIS — C3411 Malignant neoplasm of upper lobe, right bronchus or lung: Secondary | ICD-10-CM | POA: Diagnosis not present

## 2022-03-27 DIAGNOSIS — J9622 Acute and chronic respiratory failure with hypercapnia: Secondary | ICD-10-CM | POA: Diagnosis present

## 2022-03-27 DIAGNOSIS — I25119 Atherosclerotic heart disease of native coronary artery with unspecified angina pectoris: Secondary | ICD-10-CM

## 2022-03-27 DIAGNOSIS — J701 Chronic and other pulmonary manifestations due to radiation: Secondary | ICD-10-CM | POA: Diagnosis present

## 2022-03-27 DIAGNOSIS — Z7951 Long term (current) use of inhaled steroids: Secondary | ICD-10-CM | POA: Diagnosis not present

## 2022-03-27 DIAGNOSIS — I959 Hypotension, unspecified: Secondary | ICD-10-CM | POA: Diagnosis present

## 2022-03-27 DIAGNOSIS — Z79899 Other long term (current) drug therapy: Secondary | ICD-10-CM

## 2022-03-27 DIAGNOSIS — J9621 Acute and chronic respiratory failure with hypoxia: Principal | ICD-10-CM | POA: Diagnosis present

## 2022-03-27 DIAGNOSIS — I5032 Chronic diastolic (congestive) heart failure: Secondary | ICD-10-CM | POA: Diagnosis present

## 2022-03-27 DIAGNOSIS — G4733 Obstructive sleep apnea (adult) (pediatric): Secondary | ICD-10-CM | POA: Diagnosis present

## 2022-03-27 DIAGNOSIS — Z95828 Presence of other vascular implants and grafts: Secondary | ICD-10-CM

## 2022-03-27 DIAGNOSIS — Z66 Do not resuscitate: Secondary | ICD-10-CM | POA: Diagnosis present

## 2022-03-27 DIAGNOSIS — Z888 Allergy status to other drugs, medicaments and biological substances status: Secondary | ICD-10-CM

## 2022-03-27 LAB — BLOOD GAS, ARTERIAL
Acid-Base Excess: 5.5 mmol/L — ABNORMAL HIGH (ref 0.0–2.0)
Acid-Base Excess: 5.8 mmol/L — ABNORMAL HIGH (ref 0.0–2.0)
Bicarbonate: 34.2 mmol/L — ABNORMAL HIGH (ref 20.0–28.0)
Bicarbonate: 33 mmol/L — ABNORMAL HIGH (ref 20.0–28.0)
Drawn by: 23430
Drawn by: 21310
FIO2: 21 %
FIO2: 40 %
O2 Saturation: 87.5 %
O2 Saturation: 87.4 %
Patient temperature: 37.2
Patient temperature: 37.2
pCO2 arterial: 69 mmHg (ref 32–48)
pCO2 arterial: 58 mmHg — ABNORMAL HIGH (ref 32–48)
pH, Arterial: 7.31 — ABNORMAL LOW (ref 7.35–7.45)
pH, Arterial: 7.37 (ref 7.35–7.45)
pO2, Arterial: 56 mmHg — ABNORMAL LOW (ref 83–108)
pO2, Arterial: 56 mmHg — ABNORMAL LOW (ref 83–108)

## 2022-03-27 LAB — CBC WITH DIFFERENTIAL/PLATELET
Abs Immature Granulocytes: 0.05 10*3/uL (ref 0.00–0.07)
Basophils Absolute: 0 10*3/uL (ref 0.0–0.1)
Basophils Relative: 0 %
Eosinophils Absolute: 0.2 10*3/uL (ref 0.0–0.5)
Eosinophils Relative: 1 %
HCT: 47.4 % (ref 39.0–52.0)
Hemoglobin: 15 g/dL (ref 13.0–17.0)
Immature Granulocytes: 0 %
Lymphocytes Relative: 10 %
Lymphs Abs: 1.3 10*3/uL (ref 0.7–4.0)
MCH: 29.9 pg (ref 26.0–34.0)
MCHC: 31.6 g/dL (ref 30.0–36.0)
MCV: 94.6 fL (ref 80.0–100.0)
Monocytes Absolute: 1.7 10*3/uL — ABNORMAL HIGH (ref 0.1–1.0)
Monocytes Relative: 13 %
Neutro Abs: 9.9 10*3/uL — ABNORMAL HIGH (ref 1.7–7.7)
Neutrophils Relative %: 76 %
Platelets: 251 10*3/uL (ref 150–400)
RBC: 5.01 MIL/uL (ref 4.22–5.81)
RDW: 14.3 % (ref 11.5–15.5)
WBC: 13 10*3/uL — ABNORMAL HIGH (ref 4.0–10.5)
nRBC: 0 % (ref 0.0–0.2)

## 2022-03-27 LAB — MRSA NEXT GEN BY PCR, NASAL: MRSA by PCR Next Gen: NOT DETECTED

## 2022-03-27 LAB — BASIC METABOLIC PANEL
Anion gap: 7 (ref 5–15)
BUN: 13 mg/dL (ref 8–23)
CO2: 29 mmol/L (ref 22–32)
Calcium: 9.2 mg/dL (ref 8.9–10.3)
Chloride: 104 mmol/L (ref 98–111)
Creatinine, Ser: 1.17 mg/dL (ref 0.61–1.24)
GFR, Estimated: >60 mL/min (ref >60)
Glucose, Bld: 114 mg/dL — ABNORMAL HIGH (ref 70–99)
Potassium: 3.9 mmol/L (ref 3.5–5.1)
Sodium: 140 mmol/L (ref 135–145)

## 2022-03-27 LAB — TROPONIN I (HIGH SENSITIVITY)
Troponin I (High Sensitivity): 14 ng/L (ref <18)
Troponin I (High Sensitivity): 11 ng/L (ref <18)

## 2022-03-27 LAB — RESP PANEL BY RT-PCR (FLU A&B, COVID) ARPGX2
Influenza A by PCR: NEGATIVE
Influenza B by PCR: NEGATIVE
SARS Coronavirus 2 by RT PCR: NEGATIVE

## 2022-03-27 LAB — LACTIC ACID, PLASMA: Lactic Acid, Venous: 1.8 mmol/L (ref 0.5–1.9)

## 2022-03-27 LAB — BRAIN NATRIURETIC PEPTIDE: B Natriuretic Peptide: 43 pg/mL (ref 0.0–100.0)

## 2022-03-27 LAB — GLUCOSE, CAPILLARY
Glucose-Capillary: 139 mg/dL — ABNORMAL HIGH (ref 70–99)
Glucose-Capillary: 152 mg/dL — ABNORMAL HIGH (ref 70–99)

## 2022-03-27 LAB — CBG MONITORING, ED: Glucose-Capillary: 114 mg/dL — ABNORMAL HIGH (ref 70–99)

## 2022-03-27 MED ORDER — INSULIN ASPART 100 UNIT/ML IJ SOLN
0.0000 [IU] | Freq: Four times a day (QID) | INTRAMUSCULAR | Status: DC
  Administered 2022-03-27 – 2022-03-28 (×3): 1 [IU] via SUBCUTANEOUS
  Administered 2022-03-28: 2 [IU] via SUBCUTANEOUS
  Administered 2022-03-28 – 2022-03-30 (×7): 1 [IU] via SUBCUTANEOUS

## 2022-03-27 MED ORDER — METHYLPREDNISOLONE SODIUM SUCC 125 MG IJ SOLR
60.0000 mg | Freq: Two times a day (BID) | INTRAMUSCULAR | Status: DC
  Administered 2022-03-28: 60 mg via INTRAVENOUS
  Filled 2022-03-27: qty 2

## 2022-03-27 MED ORDER — AMITRIPTYLINE HCL 25 MG PO TABS
100.0000 mg | ORAL_TABLET | Freq: Every day | ORAL | Status: DC
  Administered 2022-03-27 – 2022-03-29 (×3): 100 mg via ORAL
  Filled 2022-03-27 (×3): qty 4

## 2022-03-27 MED ORDER — ALBUTEROL SULFATE HFA 108 (90 BASE) MCG/ACT IN AERS
2.0000 | INHALATION_SPRAY | RESPIRATORY_TRACT | Status: DC | PRN
  Administered 2022-03-27: 2 via RESPIRATORY_TRACT
  Filled 2022-03-27: qty 6.7

## 2022-03-27 MED ORDER — ASPIRIN EC 81 MG PO TBEC
81.0000 mg | DELAYED_RELEASE_TABLET | Freq: Every day | ORAL | Status: DC
  Administered 2022-03-27 – 2022-03-30 (×4): 81 mg via ORAL
  Filled 2022-03-27 (×8): qty 1

## 2022-03-27 MED ORDER — PREGABALIN 75 MG PO CAPS
200.0000 mg | ORAL_CAPSULE | Freq: Two times a day (BID) | ORAL | Status: DC
  Administered 2022-03-27 – 2022-03-30 (×6): 200 mg via ORAL
  Filled 2022-03-27 (×4): qty 2
  Filled 2022-03-27: qty 1
  Filled 2022-03-27: qty 2

## 2022-03-27 MED ORDER — METHYLPREDNISOLONE SODIUM SUCC 125 MG IJ SOLR
125.0000 mg | Freq: Once | INTRAMUSCULAR | Status: AC
  Administered 2022-03-27: 125 mg via INTRAVENOUS
  Filled 2022-03-27: qty 2

## 2022-03-27 MED ORDER — GUAIFENESIN-DM 100-10 MG/5ML PO SYRP
10.0000 mL | ORAL_SOLUTION | Freq: Four times a day (QID) | ORAL | Status: AC
  Administered 2022-03-27 – 2022-03-28 (×4): 10 mL via ORAL
  Filled 2022-03-27 (×4): qty 10

## 2022-03-27 MED ORDER — POLYETHYLENE GLYCOL 3350 17 G PO PACK: 17.0000 g | PACK | Freq: Every day | ORAL | Status: DC | PRN

## 2022-03-27 MED ORDER — PREDNISONE 20 MG PO TABS: 40.0000 mg | ORAL_TABLET | Freq: Every day | ORAL | Status: DC

## 2022-03-27 MED ORDER — ACETAMINOPHEN 650 MG RE SUPP: 650.0000 mg | Freq: Four times a day (QID) | RECTAL | Status: DC | PRN

## 2022-03-27 MED ORDER — ORAL CARE MOUTH RINSE
15.0000 mL | Freq: Two times a day (BID) | OROMUCOSAL | Status: DC
  Administered 2022-03-28 – 2022-03-29 (×4): 15 mL via OROMUCOSAL

## 2022-03-27 MED ORDER — IPRATROPIUM-ALBUTEROL 0.5-2.5 (3) MG/3ML IN SOLN: 3.0000 mL | RESPIRATORY_TRACT | Status: DC | PRN

## 2022-03-27 MED ORDER — OXYCODONE HCL 5 MG PO TABS
15.0000 mg | ORAL_TABLET | Freq: Once | ORAL | Status: AC
  Administered 2022-03-27: 15 mg via ORAL
  Filled 2022-03-27: qty 3

## 2022-03-27 MED ORDER — AZITHROMYCIN 500 MG IV SOLR
500.0000 mg | INTRAVENOUS | Status: DC
  Administered 2022-03-27 – 2022-03-28 (×2): 500 mg via INTRAVENOUS
  Filled 2022-03-27 (×2): qty 5

## 2022-03-27 MED ORDER — CHLORHEXIDINE GLUCONATE 0.12 % MT SOLN
15.0000 mL | Freq: Two times a day (BID) | OROMUCOSAL | Status: DC
  Administered 2022-03-27 – 2022-03-30 (×5): 15 mL via OROMUCOSAL
  Filled 2022-03-27 (×3): qty 15

## 2022-03-27 MED ORDER — ENOXAPARIN SODIUM 150 MG/ML IJ SOSY
150.0000 mg | PREFILLED_SYRINGE | Freq: Once | INTRAMUSCULAR | Status: AC
  Administered 2022-03-27: 150 mg via SUBCUTANEOUS
  Filled 2022-03-27: qty 1

## 2022-03-27 MED ORDER — ALBUTEROL SULFATE (2.5 MG/3ML) 0.083% IN NEBU
2.5000 mg | INHALATION_SOLUTION | Freq: Four times a day (QID) | RESPIRATORY_TRACT | Status: DC | PRN
  Administered 2022-03-27: 2.5 mg via RESPIRATORY_TRACT
  Filled 2022-03-27: qty 3

## 2022-03-27 MED ORDER — ONDANSETRON HCL 4 MG PO TABS: 4.0000 mg | ORAL_TABLET | Freq: Four times a day (QID) | ORAL | Status: DC | PRN

## 2022-03-27 MED ORDER — FLUTICASONE FUROATE-VILANTEROL 200-25 MCG/ACT IN AEPB: 1.0000 | INHALATION_SPRAY | Freq: Every day | RESPIRATORY_TRACT | Status: DC | PRN

## 2022-03-27 MED ORDER — ACETAMINOPHEN 325 MG PO TABS
650.0000 mg | ORAL_TABLET | Freq: Four times a day (QID) | ORAL | Status: DC | PRN
Start: 2022-03-27 — End: 2022-03-30

## 2022-03-27 MED ORDER — ALBUTEROL SULFATE (2.5 MG/3ML) 0.083% IN NEBU
INHALATION_SOLUTION | RESPIRATORY_TRACT | Status: AC
  Administered 2022-03-27: 10 mg
  Filled 2022-03-27: qty 12

## 2022-03-27 MED ORDER — IPRATROPIUM-ALBUTEROL 0.5-2.5 (3) MG/3ML IN SOLN
3.0000 mL | Freq: Four times a day (QID) | RESPIRATORY_TRACT | Status: DC
  Administered 2022-03-28 – 2022-03-30 (×11): 3 mL via RESPIRATORY_TRACT
  Filled 2022-03-27 (×3): qty 3
  Filled 2022-03-27: qty 6
  Filled 2022-03-27 (×7): qty 3

## 2022-03-27 MED ORDER — ATORVASTATIN CALCIUM 40 MG PO TABS
80.0000 mg | ORAL_TABLET | Freq: Every day | ORAL | Status: DC
  Administered 2022-03-27 – 2022-03-29 (×3): 80 mg via ORAL
  Filled 2022-03-27 (×3): qty 2

## 2022-03-27 MED ORDER — SODIUM CHLORIDE 0.9 % IV BOLUS
500.0000 mL | Freq: Once | INTRAVENOUS | Status: AC
  Administered 2022-03-27: 500 mL via INTRAVENOUS

## 2022-03-27 MED ORDER — ONDANSETRON HCL 4 MG/2ML IJ SOLN: 4.0000 mg | Freq: Four times a day (QID) | INTRAMUSCULAR | Status: DC | PRN

## 2022-03-27 MED ORDER — ALBUTEROL (5 MG/ML) CONTINUOUS INHALATION SOLN: 10.0000 mg/h | INHALATION_SOLUTION | Freq: Once | RESPIRATORY_TRACT | Status: DC

## 2022-03-27 NOTE — ED Notes (Signed)
RT called for bipap at this time.  

## 2022-03-27 NOTE — Assessment & Plan Note (Addendum)
Reported chest pain initially EKG-personally reviewed sinus, no ST ST wave changes -troponins unremarkable.   -Cardiac cath 12/05/2021, diffuse stenosis ranging from 25 to 80%.  Recommendations was for medical therapy considering patient's multiple comorbidities, per note- ostial LAD lesion and RCA disease would best be treated with bypass surgery. - V/Q scan--low probability -Hold carvedilol, Entresto, Norvasc for now with initial hypotension -Resume aspirin, statin

## 2022-03-27 NOTE — Assessment & Plan Note (Addendum)
Initial somnolence reported by ED provider  -Some degree of CO2 narcosis, P CO2- 69. -weaned off BiPAP -Limit home opiates -improved

## 2022-03-27 NOTE — ED Triage Notes (Signed)
Dr. Nevada Crane sent patient her for hypotension and cough. Denies fever. Hx of lung cancer with remission since 2020.  ?

## 2022-03-27 NOTE — H&P (Signed)
?History and Physical  ? ? ?[name]. OBS:[id number] DOB: December 23, 1955 DOA: 03/27/2022 ? ?PCP: Celene Squibb, MD  ? ?Patient coming from: Home ? ?I have personally briefly reviewed patient's old medical records in [facility] ? ?Chief Complaint: Difficulty breathing ? ?HPI: Alexander Hatfield. is a 66 y.o. male with medical history significant for COPD with Chronic respiratory failure, OSA, lung cancer, hypertension, diastolic CHF. ?Patient presented to the ED from primary care provider's office with reports of hypotension and cough.  Patient has chronic difficulty breathing and cough, but tells me that today his breathing got worse.  His cough is worse and productive of yellowish sputum.  Reports intermittent right-sided chest pain which he thinks is related to his cough.  He has lower extremity swelling, most of which is chronic. ?No fevers no chills. ?Patient's wife of 81 years just passed away yesterday from COPD, he tells me she was not intubated, she was DNR.  Patient's son [name] drove today from [place], and is present at bedside. ?Patient is supposed to be on CPAP but he has not used in at least 2 to 3 weeks, he tells me because of increasing discomfort.  He also tells me he is not on home O2. ?Patient was sleeping and snoring/somnolent earlier in the ED, hence there was a concern for hypercapnia. ? ?ED Course: Afebrile temperature 98.9.  Heart rate 70s to 80s.  Respiratory rate 16-28.  Blood pressure systolic 47-654.  O2 sats 75% on room air, ABG showed pH of 7.3, PCO2 of 69, PO2 of 56.  He was placed on BiPAP.  Troponin 14 > 11.  WBC 13.  Chest x-ray shows post treatment with left-sided radiation fibrosis  similar prior otherwise without acute abnormality.   ?500 mill bolus given with improvement in blood pressure. ?IV Solu-Medrol 125 mg given. ?Hospitalist to admit for COPD exacerbation with acute hypoxic and hypercarbic respiratory failure ? ?Review of Systems: As per HPI all other systems reviewed  and negative. ? ?Past Medical History:  ?Diagnosis Date  ? Arthritis   ? Chronic back pain   ? Chronic back pain   ? Headache   ? HOH (hard of hearing)   ? Hypertension   ? Lung mass   ? Pre-diabetes   ? Sleep apnea   ? wears cpap  ? Wears glasses   ? Wears partial dentures   ? ? ?Past Surgical History:  ?Procedure Laterality Date  ? ABDOMINAL AORTOGRAM W/LOWER EXTREMITY Right 02/18/2020  ? Procedure: ABDOMINAL AORTOGRAM W/LOWER EXTREMITY;  Surgeon: Lorretta Harp, MD;  Location: [facility];  Service: Cardiovascular;  Laterality: Right;  ? ANTERIOR CERVICAL DECOMP/DISCECTOMY FUSION N/A 10/27/2018  ? Procedure: ANTERIOR CERVICAL THREE-FOUR, FOUR-FIVE DECOMPRESSION/DISCECTOMY FUSION TWO LEVELS;  Surgeon: Kary Kos, MD;  Location: [facility];  Service: Neurosurgery;  Laterality: N/A;   ANTERIOR CERVICAL THREE-FOUR, FOUR-FIVE DECOMPRESSION/DISCECTOMY FUSION TWO LEVELS  ? BACK SURGERY    ? IR IMAGING GUIDED PORT INSERTION  11/27/2018  ? MULTIPLE TOOTH EXTRACTIONS    ? RIGHT/LEFT HEART CATH AND CORONARY ANGIOGRAPHY N/A 12/05/2021  ? Procedure: RIGHT/LEFT HEART CATH AND CORONARY ANGIOGRAPHY;  Surgeon: Jettie Booze, MD;  Location: [facility];  Service: Cardiovascular;  Laterality: N/A;  ? SPINAL CORD STIMULATOR IMPLANT    ? VIDEO BRONCHOSCOPY N/A 10/29/2018  ? Procedure: VIDEO BRONCHOSCOPY;  Surgeon: Laurin Coder, MD;  Location: [facility];  Service: Thoracic;  Laterality: N/A;  ? VIDEO BRONCHOSCOPY WITH ENDOBRONCHIAL ULTRASOUND  N/A 10/27/2018  ? Procedure: VIDEO BRONCHOSCOPY WITH ENDOBRONCHIAL ULTRASOUND;  Surgeon: Laurin Coder, MD;  Location: [facility];  Service: Thoracic;  Laterality: N/A;  ? VIDEO BRONCHOSCOPY WITH ENDOBRONCHIAL ULTRASOUND N/A 10/29/2018  ? Procedure: VIDEO BRONCHOSCOPY WITH ENDOBRONCHIAL ULTRASOUND;  Surgeon: Laurin Coder, MD;  Location: [facility];  Service: Thoracic;  Laterality: N/A;  ? ? ? reports that he has quit smoking. His smoking use included cigarettes. He has never  used smokeless tobacco. He reports that he does not drink alcohol and does not use drugs. ? ?Allergies  ?Allergen Reactions  ? Bee Venom Other (See Comments)  ?  Dizzy/ swim head  ? Iohexol Other (See Comments)  ?   Desc: PT STATES THAT WHEN GIVEN THE CONTRAST HIS LEGS BURN AND HE PASSES OUT, COMA FOR 7DAY. DR Thornton Papas WOULD NOT PRE MEDICATE DUE TO SEVERITY OF REACTION, STUDY WAS CX ? ?Isovue Preservative caused the problem ? ?  ? ? ?Family History  ?Problem Relation Age of Onset  ? Lupus Mother   ? Lung disease Father   ? ?Prior to Admission medications   ?Medication Sig Start Date End Date Taking? Authorizing Provider  ?albuterol (VENTOLIN HFA) 108 (90 Base) MCG/ACT inhaler Inhale 2 puffs into the lungs every 6 (six) hours as needed for wheezing or shortness of breath.   Yes [provider]  ?amitriptyline (ELAVIL) 100 MG tablet Take 100 mg by mouth at bedtime. 11/08/21  Yes [provider]  ?amLODipine (NORVASC) 10 MG tablet Take 10 mg by mouth daily.  09/16/17  Yes [provider]  ?aspirin 81 MG EC tablet Take 1 tablet (81 mg total) by mouth daily. Swallow whole. 12/08/21  Yes Cheryln Manly, NP  ?atorvastatin (LIPITOR) 80 MG tablet Take 1 tablet (80 mg total) by mouth at bedtime. 12/07/21  Yes Cheryln Manly, NP  ?baclofen (LIORESAL) 10 MG tablet Take 10 mg by mouth 3 (three) times daily.   Yes [provider]  ?[name] 200-25 MCG/INH AEPB Inhale 1 puff into the lungs daily as needed (asthma). 10/09/19  Yes [provider]  ?carvedilol (COREG) 3.125 MG tablet Take 1 tablet (3.125 mg total) by mouth 2 (two) times daily. 01/10/22 04/10/22 Yes [name], MD  ?furosemide (LASIX) 20 MG tablet Take 20 mg by mouth daily.  09/25/18  Yes [provider]  ?ipratropium-albuterol (DUONEB) 0.5-2.5 (3) MG/3ML SOLN Inhale 3 mLs into the lungs every 4 (four) hours as needed (shortness of breath).  10/09/19  Yes [provider]  ?meloxicam (MOBIC) 15 MG  tablet Take 15 mg by mouth daily.  08/20/18  Yes [provider]  ?metFORMIN (GLUCOPHAGE) 500 MG tablet Take 500 mg by mouth daily. 09/27/21  Yes [provider]  ?NARCAN 4 MG/0.1ML LIQD nasal spray kit Place 1 spray into the nose See admin instructions. Administer a single spray in one nostril upon signs of opioid overdose.  Call 911.  Repeat after 3 minutes if no response. 08/27/19  Yes [provider]  ?oxyCODONE (ROXICODONE) 15 MG immediate release tablet Take 15 mg by mouth 4 (four) times daily.  01/23/20  Yes [provider]  ?potassium chloride SA (K-DUR,KLOR-CON) 20 MEQ tablet Take 20 mEq by mouth every evening.  05/16/17  Yes [provider]  ?pregabalin (LYRICA) 200 MG capsule Take 200 mg by mouth 2 (two) times daily. 10/03/21  Yes [provider]  ?sacubitril-valsartan (ENTRESTO) 24-26 MG Take 1 tablet by mouth 2 (two) times daily. ?  Patient not taking: Reported on 03/27/2022 12/08/21   Cheryln Manly, NP  ? ? ?Physical Exam: ?Vitals:  ? 03/27/22 1800 03/27/22 1823 03/27/22 1850 03/27/22 1921  ?BP: 123/71     ?Pulse: 74  78 83  ?Resp: 19  17 (!) 28  ?Temp:  97.6 ?F (36.4 ?C) 98.8 ?F (37.1 ?C)   ?TempSrc:  Axillary Axillary   ?SpO2: 99%  98% 94%  ?Weight:  (!) 153.6 kg    ?Height:  _0  (1.854 m)    ? ? ?Constitutional: NAD, calm, comfortable ?Vitals:  ? 03/27/22 1800 03/27/22 1823 03/27/22 1850 03/27/22 1921  ?BP: 123/71     ?Pulse: 74  78 83  ?Resp: 19  17 (!) 28  ?Temp:  97.6 ?F (36.4 ?C) 98.8 ?F (37.1 ?C)   ?TempSrc:  Axillary Axillary   ?SpO2: 99%  98% 94%  ?Weight:  (!) 153.6 kg    ?Height:  _1  (1.854 m)    ? ?Eyes: PERRL, lids and conjunctivae normal ?ENMT: Mucous membranes are moist.  ?Neck: normal, supple, no masses, no thyromegaly ?Respiratory: On BiPAP, diffuse expiratory rhonchi, with added sounds likely from from secretions and airway,.  ?Cardiovascular: Regular rate and rhythm, no murmurs / rubs / gallops.  Trace to 1+ bilateral pitting  edema right worse than left.    Port-A-Cath right upper chest, no purulence, no fluctuance, no erythema. ?Abdomen: no tenderness, no masses palpated. No hepatosplenomegaly. Bowel sounds positive.  ?Muscul

## 2022-03-27 NOTE — Progress Notes (Signed)
Fio2 increased to 50% due to patients spo2 continuing dropping to 84% and staying IPAP increased to 16 ?

## 2022-03-27 NOTE — Assessment & Plan Note (Addendum)
Squamous cell cancer diagnosed in 2019.  Follows with Dr. Earlie Server.  Status post chemoradiation, immunotherapy.  Currently under observation. ?

## 2022-03-27 NOTE — Assessment & Plan Note (Addendum)
Initial hypotension, improved after 500 mill bolus -Hold Norvasc, Coreg, Entresto for now. -Does not appear patient is taking Entresto -restart coreg after d/c

## 2022-03-27 NOTE — Assessment & Plan Note (Addendum)
12/14/21  A1c 6.1. ?- SSI- S ?-Hold metformin ? ?

## 2022-03-27 NOTE — ED Provider Notes (Signed)
Patient's ?Alexander ?Provider Note ? ? ?CSN: [id number] ?Arrival date & time: 03/27/22  1242 ? ?  ? ?History ? ?Chief Complaint  ?Patient presents with  ? Hypotension  ? Cough  ? ? ?Alexander Hatfield. is a 65 y.o. male. ? ?HPI ?Patient presenting for weakness, shortness of breath, cough productive of yellow sputum for several days.  Was hospitalized and died last evening, at this facility.  He is here with his sister, who brought him here at the patient's request.  Patient has history of cardiac disease, possibly heart failure and COPD.  He also reportedly has history of lung cancer. ?  ? ?Home Medications ?Prior to Admission medications   ?Medication Sig Start Date End Date Taking? Authorizing Provider  ?albuterol (VENTOLIN HFA) 108 (90 Base) MCG/ACT inhaler Inhale 2 puffs into the lungs every 6 (six) hours as needed for wheezing or shortness of breath.   Yes [provider]  ?amitriptyline (ELAVIL) 100 MG tablet Take 100 mg by mouth at bedtime. 11/08/21  Yes [provider]  ?amLODipine (NORVASC) 10 MG tablet Take 10 mg by mouth daily.  09/16/17  Yes [provider]  ?aspirin 81 MG EC tablet Take 1 tablet (81 mg total) by mouth daily. Swallow whole. 12/08/21  Yes Cheryln Manly, NP  ?atorvastatin (LIPITOR) 80 MG tablet Take 1 tablet (80 mg total) by mouth at bedtime. 12/07/21  Yes Cheryln Manly, NP  ?baclofen (LIORESAL) 10 MG tablet Take 10 mg by mouth 3 (three) times daily.   Yes [provider]  ?[name] 200-25 MCG/INH AEPB Inhale 1 puff into the lungs daily as needed (asthma). 10/09/19  Yes [provider]  ?carvedilol (COREG) 3.125 MG tablet Take 1 tablet (3.125 mg total) by mouth 2 (two) times daily. 01/10/22 04/10/22 Yes [name], MD  ?furosemide (LASIX) 20 MG tablet Take 20 mg by mouth daily.  09/25/18  Yes [provider]  ?ipratropium-albuterol (DUONEB) 0.5-2.5 (3) MG/3ML SOLN Inhale 3 mLs into the lungs every 4  (four) hours as needed (shortness of breath).  10/09/19  Yes [provider]  ?meloxicam (MOBIC) 15 MG tablet Take 15 mg by mouth daily.  08/20/18  Yes [provider]  ?metFORMIN (GLUCOPHAGE) 500 MG tablet Take 500 mg by mouth daily. 09/27/21  Yes [provider]  ?NARCAN 4 MG/0.1ML LIQD nasal spray kit Place 1 spray into the nose See admin instructions. Administer a single spray in one nostril upon signs of opioid overdose.  Call 911.  Repeat after 3 minutes if no response. 08/27/19  Yes [provider]  ?oxyCODONE (ROXICODONE) 15 MG immediate release tablet Take 15 mg by mouth 4 (four) times daily.  01/23/20  Yes [provider]  ?potassium chloride SA (K-DUR,KLOR-CON) 20 MEQ tablet Take 20 mEq by mouth every evening.  05/16/17  Yes [provider]  ?pregabalin (LYRICA) 200 MG capsule Take 200 mg by mouth 2 (two) times daily. 10/03/21  Yes [provider]  ?sacubitril-valsartan (ENTRESTO) 24-26 MG Take 1 tablet by mouth 2 (two) times daily. ?Patient not taking: Reported on 03/27/2022 12/08/21   Cheryln Manly, NP  ?   ? ?Allergies    ?Bee venom and Iohexol   ? ?Review of Systems   ?Review of Systems ? ?Physical Exam ?Updated Vital Signs ?BP 112/66   Pulse 77   Temp 98.9 ?F (37.2 ?C) (Oral)   Resp 15   Ht 5' 11" (1.803 m)  Wt (!) 153.8 kg   SpO2 91%   BMI 47.28 kg/m?  ?Physical Exam ?Vitals and nursing note reviewed.  ?Constitutional:   ?   General: He is not in acute distress. ?   Appearance: He is well-developed. He is obese. He is not ill-appearing or diaphoretic.  ?HENT:  ?   Head: Normocephalic and atraumatic.  ?   Right Ear: External ear normal.  ?   Left Ear: External ear normal.  ?Eyes:  ?   Conjunctiva/sclera: Conjunctivae normal.  ?   Pupils: Pupils are equal, round, and reactive to light.  ?Neck:  ?   Trachea: Phonation normal.  ?Cardiovascular:  ?   Rate and Rhythm: Normal rate and regular rhythm.  ?   Heart sounds: Normal heart  sounds.  ?Pulmonary:  ?   Effort: Pulmonary effort is normal. No respiratory distress.  ?   Breath sounds: No stridor. No rhonchi.  ?Chest:  ?   Chest wall: No tenderness.  ?Abdominal:  ?   General: There is distension.  ?   Palpations: Abdomen is soft.  ?   Tenderness: There is no abdominal tenderness.  ?Musculoskeletal:     ?   General: Normal range of motion.  ?   Cervical back: Normal range of motion and neck supple.  ?   Right lower leg: Edema present.  ?   Left lower leg: Edema present.  ?   Comments: 1+ lower leg edema bilaterally.  ?Skin: ?   General: Skin is warm and dry.  ?Neurological:  ?   Mental Status: He is alert and oriented to person, place, and time.  ?   Cranial Nerves: No cranial nerve deficit.  ?   Sensory: No sensory deficit.  ?   Motor: No abnormal muscle tone.  ?   Coordination: Coordination normal.  ?Psychiatric:     ?   Mood and Affect: Mood normal.     ?   Behavior: Behavior normal.     ?   Thought Content: Thought content normal.     ?   Judgment: Judgment normal.  ? ? ?ED Results / Procedures / Treatments   ?Labs ?(all labs ordered are listed, but only abnormal results are displayed) ?Labs Reviewed  ?BASIC METABOLIC PANEL - Abnormal; Notable for the following components:  ?    Result Value  ? Glucose, Bld 114 (*)   ? All other components within normal limits  ?CBC WITH DIFFERENTIAL/PLATELET - Abnormal; Notable for the following components:  ? WBC 13.0 (*)   ? Neutro Abs 9.9 (*)   ? Monocytes Absolute 1.7 (*)   ? All other components within normal limits  ?CBG MONITORING, ED - Abnormal; Notable for the following components:  ? Glucose-Capillary 114 (*)   ? All other components within normal limits  ?RESP PANEL BY RT-PCR (FLU A&B, COVID) [id number]  ?CULTURE, BLOOD (ROUTINE X 2)  ?CULTURE, BLOOD (ROUTINE X 2)  ?BRAIN NATRIURETIC PEPTIDE  ?LACTIC ACID, PLASMA  ?BLOOD GAS, ARTERIAL  ?TROPONIN I (HIGH SENSITIVITY)  ?TROPONIN I (HIGH SENSITIVITY)  ? ? ?EKG ?EKG  Interpretation ? ?Date/Time:  Tuesday Mar 27 2022 13:29:00 EDT ?Ventricular Rate:  79 ?PR Interval:  170 ?QRS Duration: 100 ?QT Interval:  376 ?QTC Calculation: 431 ?R Axis:   69 ?Text Interpretation: Sinus rhythm Low voltage, extremity and precordial leads since last tracing no significant change Confirmed by Daleen Bo [id number]) on 03/27/2022 2:31:04 PM ? ?Radiology ?DG Chest Port 1 View ? ?Result Date: 03/27/2022 ?  CLINICAL DATA:  Hypertension and cough.  History of lung cancer. EXAM: PORTABLE CHEST 1 VIEW COMPARISON:  Chest CT October 26, 2021 FINDINGS: Right chest wall Port-A-Cath with tip near the superior cavoatrial junction. Partially visualized spinal stimulating generator lead. The heart size and mediastinal contours are within normal limits. Aortic atherosclerosis. 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 No new focal airspace consolidations. No pleural effusion. No pneumothorax. The visualized skeletal structures are unremarkable. IMPRESSION: 1. 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 2. No new focal airspace consolidations. Electronically Signed   By: Dahlia Bailiff M.D.   On: 03/27/2022 13:23   ? ?Procedures ?Procedures  ? ? ?Medications Ordered in ED ?Medications  ?methylPREDNISolone sodium succinate (SOLU-MEDROL) 125 mg/2 mL injection 125 mg (has no administration in time range)  ?albuterol (PROVENTIL) (2.5 MG/3ML) 0.083% nebulizer solution 2.5 mg (has no administration in time range)  ?sodium chloride 0.9 % bolus 500 mL (500 mLs Intravenous New Bag/Given 03/27/22 1332)  ? ? ?ED Course/ Medical Decision Making/ A&P ?Clinical Course as of 03/27/22 1616  ?Tue Mar 27, 2022  ?1602 Patient reevaluated.  He is sleeping and snoring.  Family members in the room states that he is supposed to be  using CPAP but they are not sure that he is.  Patient was awakened and states that he has been prescribed oxygen in the past but is not currently using it.  I turned the oxygen off and within 5 minutes he desaturated to 82%.  The oxygen was placed back on b

## 2022-03-27 NOTE — ED Notes (Signed)
Attempted to call sister with update. No answer at this time.  ?

## 2022-03-27 NOTE — Progress Notes (Signed)
ANTICOAGULATION CONSULT NOTE - Initial Consult ? ?Pharmacy Consult for Lovenox x 1 ?Indication:  rule out pulmonary embolism ? ?Allergies  ?Allergen Reactions  ? Bee Venom Other (See Comments)  ?  Dizzy/ swim head  ? Iohexol Other (See Comments)  ?   Desc: PT STATES THAT WHEN GIVEN THE CONTRAST HIS LEGS BURN AND HE PASSES OUT, COMA FOR 7DAY. DR Thornton Papas WOULD NOT PRE MEDICATE DUE TO SEVERITY OF REACTION, STUDY WAS CX ? ?Isovue Preservative caused the problem ? ?  ? ? ?Patient Measurements: ?Height: 6\' 1"  (185.4 cm) ?Weight: (!) 153.6 kg (338 lb 10 oz) ?IBW/kg (Calculated) : 79.9 ?Lovenox Dosing Weight: 153 kg ? ?Vital Signs: ?Temp: 99 ?F (37.2 ?C) (05/16 1931) ?Temp Source: Axillary (05/16 1931) ?BP: 123/71 (05/16 1800) ?Pulse Rate: 83 (05/16 1921) ? ?Labs: ?Recent Labs  ?  03/27/22 ?1337 03/27/22 ?1510  ?HGB 15.0  --   ?HCT 47.4  --   ?PLT 251  --   ?CREATININE 1.17  --   ?TROPONINIHS 14 11  ? ? ?Estimated Creatinine Clearance: 94.8 mL/min (by C-G formula based on SCr of 1.17 mg/dL). ? ? ?Medical History: ?Past Medical History:  ?Diagnosis Date  ? Arthritis   ? Chronic back pain   ? Chronic back pain   ? Headache   ? HOH (hard of hearing)   ? Hypertension   ? Lung mass   ? Pre-diabetes   ? Sleep apnea   ? wears cpap  ? Wears glasses   ? Wears partial dentures   ? ?Assessment: ? 66 yr old male admitted with hx COPD and chronic respiratory failure, OSA, lung cancer, admitted with difficulty breathing. Noted productive cough. Started on Azithromycin and steroids for COPD exacerbation. Pharmacy has been asked to dose therapeutic Lovenox x 1 tonight. VQ scan planned to rule out PE. Contrast allergy. ? ?Goal of Therapy:  ?Anti-Xa level 0.6-1 units/ml 4hrs after LMWH dose given ?Monitor platelets by anticoagulation protocol: Yes ?  ?Plan:  ?Lovenox 150 mg SQ x 1 tonight. ?Will follow up VQ scan and further anticoagulation plans. ? ?Arty Baumgartner, RPh ?03/27/2022,9:32 PM ? ? ?

## 2022-03-27 NOTE — Assessment & Plan Note (Signed)
Has not used his CPAP in 2 to 3 weeks, reports discomfort with CPAP. ?

## 2022-03-27 NOTE — ED Provider Notes (Signed)
This patient presenting for acute on chronic hypoxic respiratory failure.  History includes CHF and radiation to the lungs.  Does not appear to be in heart failure exacerbation.  Currently somnolent and hypercarbia is suspected.  Follow-up on blood gas.  Initiate BiPAP as needed.  He will require admission. Physical Exam  BP 112/66   Pulse 77   Temp 98.9 F (37.2 C) (Oral)   Resp 15   Ht 5\' 11"  (1.803 m)   Wt (!) 153.8 kg   SpO2 91%   BMI 47.28 kg/m   Physical Exam Vitals and nursing note reviewed.  Constitutional:      General: He is not in acute distress.    Appearance: He is well-developed. He is ill-appearing (chronically).  HENT:     Head: Normocephalic and atraumatic.     Right Ear: External ear normal.     Left Ear: External ear normal.     Nose: Nose normal.  Eyes:     Extraocular Movements: Extraocular movements intact.     Conjunctiva/sclera: Conjunctivae normal.  Cardiovascular:     Rate and Rhythm: Normal rate and regular rhythm.     Heart sounds: No murmur heard. Pulmonary:     Effort: Pulmonary effort is normal.     Breath sounds: Wheezing and rhonchi present.  Chest:     Chest wall: No tenderness.  Abdominal:     Palpations: Abdomen is soft.     Tenderness: There is no abdominal tenderness.  Musculoskeletal:        General: No swelling. Normal range of motion.     Cervical back: Normal range of motion and neck supple. No rigidity.  Skin:    General: Skin is warm and dry.     Capillary Refill: Capillary refill takes less than 2 seconds.     Coloration: Skin is not jaundiced or pale.  Neurological:     General: No focal deficit present.     Mental Status: He is alert and oriented to person, place, and time.  Psychiatric:        Mood and Affect: Mood normal.        Behavior: Behavior normal.    Procedures  Procedures  ED Course / MDM   Clinical Course as of 03/27/22 1625  Tue Mar 27, 2022  1602 Patient reevaluated.  He is sleeping and snoring.   Family members in the room states that he is supposed to be using CPAP but they are not sure that he is.  Patient was awakened and states that he has been prescribed oxygen in the past but is not currently using it.  I turned the oxygen off and within 5 minutes he desaturated to 82%.  The oxygen was placed back on by nasal cannula.  He was encouraged to breathe deeply through his nose.  Patient appears to be nearing requirement for BiPAP.  Will check ABG, give nebs and steroids.  I anticipate he will need to be admitted. [EW]    Clinical Course User Index [EW] Daleen Bo, MD   Medical Decision Making Amount and/or Complexity of Data Reviewed Labs: ordered. Radiology: ordered.  Risk Prescription drug management. Decision regarding hospitalization.   Blood gas showed respiratory acidosis.  BiPAP was initiated.  On assessment, patient is alert and oriented, tolerating BiPAP well.  He continues to have diffuse wheezing on lung auscultation, despite 1 hour of continuous albuterol.  Patient was admitted to medicine for further management.       Godfrey Pick,  MD 03/30/22 1255

## 2022-03-27 NOTE — Assessment & Plan Note (Addendum)
O2 sats down to 75% on room air, pH of 7.3 and PCO2 of 69.   Personally reviewed CXR stable postradiation fibrosis.   -IV Solu-Medrol 125 mg given, continue 60 twice daily>>d/c home with prednisone taper -DuoNebs as needed and scheduled -weaned off BIPAP -Repeat ABG 7.37/58/56/33 (0.4) -continue azithromycin>>2 more days after dc -Mucolytic's - V/Q scan--low probability D/c home with prednisone taper

## 2022-03-28 ENCOUNTER — Encounter (HOSPITAL_COMMUNITY): Payer: Self-pay | Admitting: Internal Medicine

## 2022-03-28 ENCOUNTER — Inpatient Hospital Stay (HOSPITAL_COMMUNITY): Payer: Medicare Other

## 2022-03-28 DIAGNOSIS — E119 Type 2 diabetes mellitus without complications: Secondary | ICD-10-CM

## 2022-03-28 DIAGNOSIS — J9601 Acute respiratory failure with hypoxia: Secondary | ICD-10-CM | POA: Diagnosis not present

## 2022-03-28 DIAGNOSIS — G9340 Encephalopathy, unspecified: Secondary | ICD-10-CM | POA: Diagnosis not present

## 2022-03-28 DIAGNOSIS — J441 Chronic obstructive pulmonary disease with (acute) exacerbation: Secondary | ICD-10-CM | POA: Diagnosis not present

## 2022-03-28 LAB — BASIC METABOLIC PANEL
Anion gap: 6 (ref 5–15)
BUN: 15 mg/dL (ref 8–23)
CO2: 28 mmol/L (ref 22–32)
Calcium: 8.7 mg/dL — ABNORMAL LOW (ref 8.9–10.3)
Chloride: 106 mmol/L (ref 98–111)
Creatinine, Ser: 0.94 mg/dL (ref 0.61–1.24)
GFR, Estimated: >60 mL/min (ref >60)
Glucose, Bld: 168 mg/dL — ABNORMAL HIGH (ref 70–99)
Potassium: 3.6 mmol/L (ref 3.5–5.1)
Sodium: 140 mmol/L (ref 135–145)

## 2022-03-28 LAB — GLUCOSE, CAPILLARY
Glucose-Capillary: 136 mg/dL — ABNORMAL HIGH (ref 70–99)
Glucose-Capillary: 142 mg/dL — ABNORMAL HIGH (ref 70–99)
Glucose-Capillary: 150 mg/dL — ABNORMAL HIGH (ref 70–99)
Glucose-Capillary: 150 mg/dL — ABNORMAL HIGH (ref 70–99)

## 2022-03-28 LAB — CBC
HCT: 45.6 % (ref 39.0–52.0)
Hemoglobin: 14.3 g/dL (ref 13.0–17.0)
MCH: 29.4 pg (ref 26.0–34.0)
MCHC: 31.4 g/dL (ref 30.0–36.0)
MCV: 93.8 fL (ref 80.0–100.0)
Platelets: 230 10*3/uL (ref 150–400)
RBC: 4.86 MIL/uL (ref 4.22–5.81)
RDW: 13.8 % (ref 11.5–15.5)
WBC: 7.7 10*3/uL (ref 4.0–10.5)
nRBC: 0 % (ref 0.0–0.2)

## 2022-03-28 LAB — HIV ANTIBODY (ROUTINE TESTING W REFLEX): HIV Screen 4th Generation wRfx: NONREACTIVE

## 2022-03-28 MED ORDER — CHLORHEXIDINE GLUCONATE CLOTH 2 % EX PADS
6.0000 | MEDICATED_PAD | Freq: Every day | CUTANEOUS | Status: DC
  Administered 2022-03-28 – 2022-03-29 (×2): 6 via TOPICAL

## 2022-03-28 MED ORDER — METHYLPREDNISOLONE SODIUM SUCC 125 MG IJ SOLR
60.0000 mg | Freq: Two times a day (BID) | INTRAMUSCULAR | Status: DC
  Administered 2022-03-28 – 2022-03-30 (×4): 60 mg via INTRAVENOUS
  Filled 2022-03-28 (×4): qty 2

## 2022-03-28 MED ORDER — ENOXAPARIN SODIUM 40 MG/0.4ML IJ SOSY: 40.0000 mg | PREFILLED_SYRINGE | INTRAMUSCULAR | Status: DC

## 2022-03-28 MED ORDER — BUDESONIDE 0.5 MG/2ML IN SUSP
0.5000 mg | Freq: Two times a day (BID) | RESPIRATORY_TRACT | Status: DC
  Administered 2022-03-28 – 2022-03-30 (×5): 0.5 mg via RESPIRATORY_TRACT
  Filled 2022-03-28 (×5): qty 2

## 2022-03-28 MED ORDER — ENOXAPARIN SODIUM 80 MG/0.8ML IJ SOSY
75.0000 mg | PREFILLED_SYRINGE | INTRAMUSCULAR | Status: DC
  Administered 2022-03-28 – 2022-03-29 (×2): 75 mg via SUBCUTANEOUS
  Filled 2022-03-28 (×2): qty 0.8

## 2022-03-28 MED ORDER — TECHNETIUM TO 99M ALBUMIN AGGREGATED
4.0000 | Freq: Once | INTRAVENOUS | Status: AC | PRN
  Administered 2022-03-28: 4.4 via INTRAVENOUS

## 2022-03-28 MED ORDER — OXYCODONE HCL 5 MG PO TABS
15.0000 mg | ORAL_TABLET | Freq: Four times a day (QID) | ORAL | Status: DC | PRN
  Administered 2022-03-28 – 2022-03-30 (×6): 15 mg via ORAL
  Filled 2022-03-28 (×7): qty 3

## 2022-03-28 NOTE — Progress Notes (Signed)
Fio2 down to 45%due to patient awake and spo2 95-97% ?

## 2022-03-28 NOTE — Progress Notes (Signed)
?  ?       ?PROGRESS NOTE ? ?Alexander Hatfield. BJS:[id number] DOB: 06-Mar-1956 DOA: 03/27/2022 ?PCP: Celene Squibb, MD ? ?Brief History:  ?66 year old male with a history of COPD, chronic respiratory failure, squamous cell carcinoma of the lung, hypertension, diastolic CHF presented to the ED from the primary care provider's office secondary to low BP and coughing.  The patient had been complaining of shortness of breath and coughing that was worse than his usual.  He complained of yellow sputum production.  He also complained of intermittent right-sided pleuritic chest pain which he felt was due to his cough.  In addition, the patient has been complaining of lower extremity edema which he states is about the same as usual.  He denies any fevers, chills, nausea, vomiting, diarrhea, abdominal pain. ?Patient's wife of 36 years just passed away yesterday from COPD, he tells me she was not intubated, she was DNR.  Patient's son Alexander Hatfield drove today from [place], and is present at bedside. ?Patient is supposed to be on CPAP but he has not used in at least 2 to 3 weeks, he tells me because of increasing discomfort.  He also tells me he is not on home O2. ?Patient was sleeping and snoring/somnolent earlier in the ED, hence there was a concern for hypercapnia. ?  ?ED Course: Afebrile temperature 98.9.  Heart rate 70s to 80s.  Respiratory rate 16-28.  Blood pressure systolic 60-737.  O2 sats 75% on room air, ABG showed pH of 7.3, PCO2 of 69, PO2 of 56.  He was placed on BiPAP.  Troponin 14 > 11.  WBC 13.  Chest x-ray shows post treatment with left-sided radiation fibrosis  similar prior otherwise without acute abnormality.   ?500 mill bolus given with improvement in blood pressure. ?IV Solu-Medrol 125 mg given.  ? ? ?Assessment and Plan: ?* COPD with acute exacerbation ([place]) ?O2 sats down to 75% on room air, pH of 7.3 and PCO2 of 69.   ?Personally reviewed CXR stable postradiation fibrosis.   ?-IV Solu-Medrol 125 mg given, continue  60 twice daily ?-DuoNebs as needed and scheduled ?-wean BIPAP ?-Repeat ABG 7.37/58/56/33 (0.4) ?-IV azithromycin ?-Mucolytic's ?- V/Q scan ( Contrast allergy noted) considering degree of-hypoxia, and asymmetric lower extremities, history of lung cancer. ? ?Acute respiratory failure with hypoxia and hypercapnia ([facility]) ?03/27/2029 VBG--7.3 1/69/56/34 on RA ?Secondary to COPD exacerbation ?Initially on BiPAP ?Weaned to 4 L nasal cannula ?Follow-up VQ scan ? ?Controlled type 2 diabetes mellitus without complication, without long-term current use of insulin ([place]) ?12/14/21  A1c 6.1. ?- SSI- S ?-Hold metformin ? ? ?Acute encephalopathy ?Initial somnolence reported by ED provider on my evaluation patient is alert and oriented x4.   ?-Some degree of CO2 narcosis, P CO2- 69. ?-weaned off BiPAP ?-Limit home opiates ? ?CAD (coronary artery disease) ?Reported chest pain,  ?EKG-personally reviewed sinus, no ST ST wave changes ?-troponins unremarkable.   ?-Cardiac cath 12/05/2021, diffuse stenosis ranging from 25 to 80%.  Recommendations was for medical therapy considering patient's multiple comorbidities, per note- ostial LAD lesion and RCA disease would best be treated with bypass surgery. ?- V/Q scan ?-Hold carvedilol, Entresto, Norvasc for now with initial hypotension ?-Resume aspirin, statin ? ?Port-A-Cath in place ?Continue routine line care ? ?Lung cancer ([place]) ?Squamous cell cancer diagnosed in 2019.  Follows with Dr. Earlie Server.  Status post chemoradiation, immunotherapy.  Currently under observation. ? ?Essential hypertension ?Initial hypotension, improved after 500 mill bolus ?-Hold Norvasc, Coreg, Entresto for  now. ?-Does not appear patient is taking Entresto ? ?Morbid obesity ([place]) ?BMI 44.97 ?Lifestyle modification ? ?Obstructive sleep apnea ?Has not used his CPAP in 2 to 3 weeks, reports discomfort with CPAP. ? ? ? ?Family Communication:   no Family at bedside ? ?Consultants:  none ? ?Code Status:  DNR ? ?DVT Prophylaxis:   [place] Heparin / [place] Lovenox ? ? ?Procedures: ?As Listed in Progress Note Above ? ?Antibiotics: ?Azithro 5/16>> ?  ? ? ? ? ? ?Subjective: ?Pt states he is breathing better.  Has cough without blood.  Denies f/c, cp, n//v/d ? ?Objective: ?Vitals:  ? 03/28/22 0746 03/28/22 0800 03/28/22 0802 03/28/22 0900  ?BP:  (!) 120/48 (!) 120/48 126/77  ?Pulse:  87 89 94  ?Resp:  16 16 18   ?Temp: 98 ?F (36.7 ?C)     ?TempSrc: Axillary     ?SpO2:  96% 95% 91%  ?Weight:      ?Height:      ? ? ?Intake/Output Summary (Last 24 hours) at 03/28/2022 0920 ?Last data filed at 03/28/2022 0430 ?Gross per 24 hour  ?Intake 850 ml  ?Output 540 ml  ?Net 310 ml  ? ?Weight change:  ?Exam: ? ?General:  Pt is alert, follows commands appropriately, not in acute distress ?HEENT: No icterus, No thrush, No neck mass, [place]/AT ?Cardiovascular: RRR, S1/S2, no rubs, no gallops ?Respiratory: diminished BS, bibasilar rales. ?Abdomen: Soft/+BS, non tender, non distended, no guarding ?Extremities: No edema, No lymphangitis, No petechiae, No rashes, no synovitis ? ? ?Data Reviewed: ?I have personally reviewed following labs and imaging studies ?Basic Metabolic Panel: ?Recent Labs  ?Lab 03/27/22 ?1337 03/28/22 ?0434  ?NA 140 140  ?K 3.9 3.6  ?CL 104 106  ?CO2 29 28  ?GLUCOSE 114* 168*  ?BUN 13 15  ?CREATININE 1.17 0.94  ?CALCIUM 9.2 8.7*  ? ?Liver Function Tests: ?No results for input(s): AST, ALT, ALKPHOS, BILITOT, PROT, ALBUMIN in the last 168 hours. ?No results for input(s): LIPASE, AMYLASE in the last 168 hours. ?No results for input(s): AMMONIA in the last 168 hours. ?Coagulation Profile: ?No results for input(s): INR, PROTIME in the last 168 hours. ?CBC: ?Recent Labs  ?Lab 03/27/22 ?1337 03/28/22 ?0434  ?WBC 13.0* 7.7  ?NEUTROABS 9.9*  --   ?HGB 15.0 14.3  ?HCT 47.4 45.6  ?MCV 94.6 93.8  ?PLT 251 230  ? ?Cardiac Enzymes: ?No results for input(s): CKTOTAL, CKMB, CKMBINDEX, TROPONINI in the last 168 hours. ?BNP: ?Invalid input(s): POCBNP ?CBG: ?Recent Labs  ?Lab  03/27/22 ?1256 03/27/22 ?2035 03/27/22 ?2344 03/28/22 ?0552  ?GLUCAP 114* 139* 152* 136*  ? ?HbA1C: ?No results for input(s): HGBA1C in the last 72 hours. ?Urine analysis: ?   ?Component Value Date/Time  ? [place] 06/30/2017 1715  ? APPEARANCEUR HAZY (A) 06/30/2017 1715  ? LABSPEC 1.018 06/30/2017 1715  ? PHURINE 5.0 06/30/2017 1715  ? GLUCOSEU NEGATIVE 06/30/2017 1715  ? HGBUR NEGATIVE 06/30/2017 1715  ? [place] NEGATIVE 06/30/2017 1715  ? [place] NEGATIVE 06/30/2017 1715  ? PROTEINUR 30 (A) 06/30/2017 1715  ? NITRITE NEGATIVE 06/30/2017 1715  ? LEUKOCYTESUR NEGATIVE 06/30/2017 1715  ? ?Sepsis Labs: ?@LABRCNTIP (procalcitonin:4,lacticidven:4) ?) ?Recent Results (from the past 240 hour(s))  ?Resp Panel by RT-PCR (Flu A&B, Covid) Nasopharyngeal Swab     Status: None  ? Collection Time: 03/27/22  1:12 PM  ? Specimen: Nasopharyngeal Swab; Nasopharyngeal(NP) swabs in vial transport medium  ?Result Value Ref Range Status  ? SARS Coronavirus 2 by RT PCR NEGATIVE NEGATIVE Final  ?  Comment: (NOTE) ?SARS-CoV-2 target nucleic acids are NOT DETECTED. ? ?The SARS-CoV-2 RNA is generally detectable in upper respiratory ?specimens during the acute phase of infection. The lowest ?concentration of SARS-CoV-2 viral copies this assay can detect is ?138 copies/mL. A negative result does not preclude SARS-Cov-2 ?infection and should not be used as the sole basis for treatment or ?other patient management decisions. A negative result may occur with  ?improper specimen collection/handling, submission of specimen other ?than nasopharyngeal swab, presence of viral mutation(s) within the ?areas targeted by this assay, and inadequate number of viral ?copies(<138 copies/mL). A negative result must be combined with ?clinical observations, patient history, and epidemiological ?information. The expected result is Negative. ? ?Fact Sheet for Patients:  ?EntrepreneurPulse.com.au ? ?Fact Sheet for Healthcare  Providers:  ?IncredibleEmployment.be ? ?This test is no t yet approved or cleared by the [place] FDA and  ?has been authorized for detection and/or diagnosis of SARS-CoV-2 by ?FDA under

## 2022-03-28 NOTE — Progress Notes (Signed)
**Note De-Identified  Obfuscation** Patient removed from BIPAP and placed on 4 l [place]; tolerating well at this time, vs WNL, SAT 95%, alert and oriented.  RRT to continue to monitor. ?

## 2022-03-28 NOTE — Assessment & Plan Note (Signed)
PDMP reviewed ?Pt receives oxyIR 15 mg, #120 monthly ?

## 2022-03-28 NOTE — Assessment & Plan Note (Signed)
Continue routine line care ?

## 2022-03-28 NOTE — Assessment & Plan Note (Signed)
BMI 44.97 ?Lifestyle modification ?

## 2022-03-28 NOTE — TOC Progression Note (Signed)
Transition of Care (TOC) - Progression Note  ? ? ?Patient Details  ?Name: Alexander Hatfield. ?MRN: [id number] ?Date of Birth: [birth year] ? ?Transition of Care (TOC) CM/SW Contact  ?Salome Arnt, LCSW ?Phone Number: ?03/28/2022, 11:09 AM ? ?Clinical Narrative:   ?Transition of Care (TOC) Screening Note ? ? ?Patient Details  ?Name: Alexander Hatfield. ?Date of Birth: April 24, 1956 ? ? ?Transition of Care (TOC) CM/SW Contact:    ?Salome Arnt, LCSW ?Phone Number: ?03/28/2022, 11:09 AM ? ? ? ?Transition of Care Department [facility]) has reviewed patient and no TOC needs have been identified at this time. We will continue to monitor patient advancement through interdisciplinary progression rounds. If new patient transition needs arise, please place a TOC consult. ?   ? ? ? ?  ?  ? ?Expected Discharge Plan and Services ?  ?  ?  ?  ?  ?                ?  ?  ?  ?  ?  ?  ?  ?  ?  ?  ? ? ?Social Determinants of Health (SDOH) Interventions ?  ? ?Readmission Risk Interventions ?   ? View : No data to display.  ?  ?  ?  ? ? ?

## 2022-03-28 NOTE — Hospital Course (Signed)
66 year old male with a history of COPD, chronic respiratory failure, squamous cell carcinoma of the lung, hypertension, diastolic CHF presented to the ED from the primary care provider's office secondary to low BP and coughing.  The patient had been complaining of shortness of breath and coughing that was worse than his usual.  He complained of yellow sputum production.  He also complained of intermittent right-sided pleuritic chest pain which he felt was due to his cough.  In addition, the patient has been complaining of lower extremity edema which he states is about the same as usual.  He denies any fevers, chills, nausea, vomiting, diarrhea, abdominal pain. ?Patient's wife of 52 years just passed away yesterday from COPD, he tells me she was not intubated, she was DNR.  Patient's son [name] drove today from [place], and is present at bedside. ?Patient is supposed to be on CPAP but he has not used in at least 2 to 3 weeks, he tells me because of increasing discomfort.  He also tells me he is not on home O2. ?Patient was sleeping and snoring/somnolent earlier in the ED, hence there was a concern for hypercapnia. ?  ?ED Course: Afebrile temperature 98.9.  Heart rate 70s to 80s.  Respiratory rate 16-28.  Blood pressure systolic 00-762.  O2 sats 75% on room air, ABG showed pH of 7.3, PCO2 of 69, PO2 of 56.  He was placed on BiPAP.  Troponin 14 > 11.  WBC 13.  Chest x-ray shows post treatment with left-sided radiation fibrosis  similar prior otherwise without acute abnormality.   ?500 mill bolus given with improvement in blood pressure. ?IV Solu-Medrol 125 mg given. ?

## 2022-03-28 NOTE — Progress Notes (Signed)
Pt on BIPAP for the night RT will continue to monitor ?

## 2022-03-28 NOTE — Assessment & Plan Note (Addendum)
03/27/2029 VBG--7.3 1/69/56/34 on RA Secondary to COPD exacerbation Initially on BiPAP Weaned to 2 L nasal cannula VQ scan--low probability Give lasix IV x1 5/18 Desaturated to 84% with ambulation on day of d/c>>d/ home with 2L [place]

## 2022-03-29 DIAGNOSIS — J441 Chronic obstructive pulmonary disease with (acute) exacerbation: Secondary | ICD-10-CM | POA: Diagnosis not present

## 2022-03-29 DIAGNOSIS — J9602 Acute respiratory failure with hypercapnia: Secondary | ICD-10-CM | POA: Diagnosis not present

## 2022-03-29 DIAGNOSIS — G9340 Encephalopathy, unspecified: Secondary | ICD-10-CM | POA: Diagnosis not present

## 2022-03-29 DIAGNOSIS — J9601 Acute respiratory failure with hypoxia: Secondary | ICD-10-CM | POA: Diagnosis not present

## 2022-03-29 LAB — BASIC METABOLIC PANEL
Anion gap: 10 (ref 5–15)
BUN: 18 mg/dL (ref 8–23)
CO2: 24 mmol/L (ref 22–32)
Calcium: 8.8 mg/dL — ABNORMAL LOW (ref 8.9–10.3)
Chloride: 108 mmol/L (ref 98–111)
Creatinine, Ser: 0.85 mg/dL (ref 0.61–1.24)
GFR, Estimated: >60 mL/min (ref >60)
Glucose, Bld: 138 mg/dL — ABNORMAL HIGH (ref 70–99)
Potassium: 3.9 mmol/L (ref 3.5–5.1)
Sodium: 142 mmol/L (ref 135–145)

## 2022-03-29 LAB — CBC
HCT: 47.1 % (ref 39.0–52.0)
Hemoglobin: 14.3 g/dL (ref 13.0–17.0)
MCH: 29.6 pg (ref 26.0–34.0)
MCHC: 30.4 g/dL (ref 30.0–36.0)
MCV: 97.5 fL (ref 80.0–100.0)
Platelets: 241 10*3/uL (ref 150–400)
RBC: 4.83 MIL/uL (ref 4.22–5.81)
RDW: 13.9 % (ref 11.5–15.5)
WBC: 12.1 10*3/uL — ABNORMAL HIGH (ref 4.0–10.5)
nRBC: 0 % (ref 0.0–0.2)

## 2022-03-29 LAB — GLUCOSE, CAPILLARY
Glucose-Capillary: 145 mg/dL — ABNORMAL HIGH (ref 70–99)
Glucose-Capillary: 139 mg/dL — ABNORMAL HIGH (ref 70–99)
Glucose-Capillary: 139 mg/dL — ABNORMAL HIGH (ref 70–99)
Glucose-Capillary: 110 mg/dL — ABNORMAL HIGH (ref 70–99)

## 2022-03-29 LAB — MAGNESIUM: Magnesium: 2.3 mg/dL (ref 1.7–2.4)

## 2022-03-29 MED ORDER — FUROSEMIDE 10 MG/ML IJ SOLN
40.0000 mg | Freq: Once | INTRAMUSCULAR | Status: AC
  Administered 2022-03-29: 40 mg via INTRAVENOUS
  Filled 2022-03-29: qty 4

## 2022-03-29 MED ORDER — AZITHROMYCIN 250 MG PO TABS
500.0000 mg | ORAL_TABLET | ORAL | Status: DC
Start: 2022-03-29 — End: 2022-03-30
  Administered 2022-03-29: 500 mg via ORAL
  Filled 2022-03-29: qty 2

## 2022-03-29 MED ORDER — ARFORMOTEROL TARTRATE 15 MCG/2ML IN NEBU
15.0000 ug | INHALATION_SOLUTION | Freq: Two times a day (BID) | RESPIRATORY_TRACT | Status: DC
  Administered 2022-03-29 – 2022-03-30 (×2): 15 ug via RESPIRATORY_TRACT
  Filled 2022-03-29 (×2): qty 2

## 2022-03-29 NOTE — Progress Notes (Signed)
PROGRESS NOTE  Alexander Hatfield. VHQ:[id number] DOB: 09-12-1956 DOA: 03/27/2022 PCP: Celene Squibb, MD  Brief History:  66 year old male with a history of COPD, chronic respiratory failure, squamous cell carcinoma of the lung, hypertension, diastolic CHF presented to the ED from the primary care provider's office secondary to low BP and coughing.  The patient had been complaining of shortness of breath and coughing that was worse than his usual.  He complained of yellow sputum production.  He also complained of intermittent right-sided pleuritic chest pain which he felt was due to his cough.  In addition, the patient has been complaining of lower extremity edema which he states is about the same as usual.  He denies any fevers, chills, nausea, vomiting, diarrhea, abdominal pain. Patient's wife of 62 years just passed away yesterday from COPD, he tells me she was not intubated, she was DNR.  Patient's son [name] drove today from [place], and is present at bedside. Patient is supposed to be on CPAP but he has not used in at least 2 to 3 weeks, he tells me because of increasing discomfort.  He also tells me he is not on home O2. Patient was sleeping and snoring/somnolent earlier in the ED, hence there was a concern for hypercapnia.   ED Course: Afebrile temperature 98.9.  Heart rate 70s to 80s.  Respiratory rate 16-28.  Blood pressure systolic 41-324.  O2 sats 75% on room air, ABG showed pH of 7.3, PCO2 of 69, PO2 of 56.  He was placed on BiPAP.  Troponin 14 > 11.  WBC 13.  Chest x-ray shows post treatment with left-sided radiation fibrosis  similar prior otherwise without acute abnormality.   500 mill bolus given with improvement in blood pressure. IV Solu-Medrol 125 mg given.     Assessment and Plan: * Acute respiratory failure with hypoxia and hypercapnia (HCC) 03/27/2029 VBG--7.3 1/69/56/34 on RA Secondary to COPD exacerbation Initially on BiPAP Weaned to 2 L nasal cannula VQ scan--low  probability Give lasix IV x1  COPD with acute exacerbation (HCC) O2 sats down to 75% on room air, pH of 7.3 and PCO2 of 69.   Personally reviewed CXR stable postradiation fibrosis.   -IV Solu-Medrol 125 mg given, continue 60 twice daily -DuoNebs as needed and scheduled -wean BIPAP -Repeat ABG 7.37/58/56/33 (0.4) -continue azithromycin -Mucolytic's - V/Q scan--low probability  Obstructive sleep apnea Has not used his CPAP in 2 to 3 weeks, reports discomfort with CPAP.  Morbid obesity ([place]) BMI 44.97 Lifestyle modification  Controlled type 2 diabetes mellitus without complication, without long-term current use of insulin ([place]) 12/14/21  A1c 6.1. - SSI- S -Hold metformin   Acute encephalopathy Initial somnolence reported by ED provider  -Some degree of CO2 narcosis, P CO2- 69. -weaned off BiPAP -Limit home opiates -improved  CAD (coronary artery disease) Reported chest pain,  EKG-personally reviewed sinus, no ST ST wave changes -troponins unremarkable.   -Cardiac cath 12/05/2021, diffuse stenosis ranging from 25 to 80%.  Recommendations was for medical therapy considering patient's multiple comorbidities, per note- ostial LAD lesion and RCA disease would best be treated with bypass surgery. - V/Q scan--low probability -Hold carvedilol, Entresto, Norvasc for now with initial hypotension -Resume aspirin, statin  Port-A-Cath in place Continue routine line care  Lung cancer ([place]) Squamous cell cancer diagnosed in 2019.  Follows with Dr. Earlie Server.  Status post chemoradiation, immunotherapy.  Currently under observation.  Essential hypertension Initial hypotension, improved  after 500 mill bolus -Hold Norvasc, Coreg, Entresto for now. -Does not appear patient is taking Entresto  Chronic back pain PDMP reviewed Pt receives oxyIR 15 mg, #120 monthly   Family Communication:   no Family at bedside   Consultants:  none   Code Status:  DNR   DVT Prophylaxis:  [place] Heparin  / [place] Lovenox     Procedures: As Listed in Progress Note Above   Antibiotics: Azithro 5/16>>           Subjective: Pt states he is breathing better, but remains sob with exertion.  Denies cp, n/v/d, abd pain.  Objective: Vitals:   03/29/22 0750 03/29/22 0800 03/29/22 0833 03/29/22 0834  BP:  138/65    Pulse:  97 92 91  Resp:  16 19 16   Temp:      TempSrc:      SpO2: 94% 92% (!) 87% 90%  Weight:      Height:        Intake/Output Summary (Last 24 hours) at 03/29/2022 0839 Last data filed at 03/29/2022 0600 Gross per 24 hour  Intake 350.24 ml  Output 1650 ml  Net -1299.76 ml   Weight change: -1.069 kg Exam:  General:  Pt is alert, follows commands appropriately, not in acute distress HEENT: No icterus, No thrush, No neck mass, [place]/AT Cardiovascular: RRR, S1/S2, no rubs, no gallops Respiratory: scattered bilateral rales.  Bibasilar wheeze Abdomen: Soft/+BS, non tender, non distended, no guarding Extremities: trace LE edema, No lymphangitis, No petechiae, No rashes, no synovitis   Data Reviewed: I have personally reviewed following labs and imaging studies Basic Metabolic Panel: Recent Labs  Lab 03/27/22 1337 03/28/22 0434 03/29/22 0632  NA 140 140 142  K 3.9 3.6 3.9  CL 104 106 108  CO2 29 28 24   GLUCOSE 114* 168* 138*  BUN 13 15 18   CREATININE 1.17 0.94 0.85  CALCIUM 9.2 8.7* 8.8*  MG  --   --  2.3   Liver Function Tests: No results for input(s): AST, ALT, ALKPHOS, BILITOT, PROT, ALBUMIN in the last 168 hours. No results for input(s): LIPASE, AMYLASE in the last 168 hours. No results for input(s): AMMONIA in the last 168 hours. Coagulation Profile: No results for input(s): INR, PROTIME in the last 168 hours. CBC: Recent Labs  Lab 03/27/22 1337 03/28/22 0434 03/29/22 0632  WBC 13.0* 7.7 12.1*  NEUTROABS 9.9*  --   --   HGB 15.0 14.3 14.3  HCT 47.4 45.6 47.1  MCV 94.6 93.8 97.5  PLT 251 230 241   Cardiac Enzymes: No results for input(s):  CKTOTAL, CKMB, CKMBINDEX, TROPONINI in the last 168 hours. BNP: Invalid input(s): POCBNP CBG: Recent Labs  Lab 03/28/22 1139 03/28/22 1721 03/28/22 2354 03/29/22 0539 03/29/22 0749  GLUCAP 150* 142* 150* 145* 139*   HbA1C: No results for input(s): HGBA1C in the last 72 hours. Urine analysis:    Component Value Date/Time   COLORURINE YELLOW 06/30/2017 1715   APPEARANCEUR HAZY (A) 06/30/2017 1715   LABSPEC 1.018 06/30/2017 1715   PHURINE 5.0 06/30/2017 1715   GLUCOSEU NEGATIVE 06/30/2017 1715   HGBUR NEGATIVE 06/30/2017 [place] 06/30/2017 [place] 06/30/2017 1715   PROTEINUR 30 (A) 06/30/2017 1715   NITRITE NEGATIVE 06/30/2017 1715   LEUKOCYTESUR NEGATIVE 06/30/2017 1715   Sepsis Labs: @LABRCNTIP (procalcitonin:4,lacticidven:4) ) Recent Results (from the past 240 hour(s))  Resp Panel by RT-PCR (Flu A&B, Covid) Nasopharyngeal Swab     Status: None  Collection Time: 03/27/22  1:12 PM   Specimen: Nasopharyngeal Swab; Nasopharyngeal(NP) swabs in vial transport medium  Result Value Ref Range Status   SARS Coronavirus 2 by RT PCR NEGATIVE NEGATIVE Final    Comment: (NOTE) SARS-CoV-2 target nucleic acids are NOT DETECTED.  The SARS-CoV-2 RNA is generally detectable in upper respiratory specimens during the acute phase of infection. The lowest concentration of SARS-CoV-2 viral copies this assay can detect is 138 copies/mL. A negative result does not preclude SARS-Cov-2 infection and should not be used as the sole basis for treatment or other patient management decisions. A negative result may occur with  improper specimen collection/handling, submission of specimen other than nasopharyngeal swab, presence of viral mutation(s) within the areas targeted by this assay, and inadequate number of viral copies(<138 copies/mL). A negative result must be combined with clinical observations, patient history, and epidemiological information. The  expected result is Negative.  Fact Sheet for Patients:  EntrepreneurPulse.com.au  Fact Sheet for Healthcare Providers:  IncredibleEmployment.be  This test is no t yet approved or cleared by the [place] FDA and  has been authorized for detection and/or diagnosis of SARS-CoV-2 by FDA under an Emergency Use Authorization (EUA). This EUA will remain  in effect (meaning this test can be used) for the duration of the COVID-19 declaration under Section 564(b)(1) of the Act, 21 U.S.C.section 360bbb-3(b)(1), unless the authorization is terminated  or revoked sooner.       Influenza A by PCR NEGATIVE NEGATIVE Final   Influenza B by PCR NEGATIVE NEGATIVE Final    Comment: (NOTE) The Xpert Xpress SARS-CoV-2/FLU/RSV plus assay is intended as an aid in the diagnosis of influenza from Nasopharyngeal swab specimens and should not be used as a sole basis for treatment. Nasal washings and aspirates are unacceptable for Xpert Xpress SARS-CoV-2/FLU/RSV testing.  Fact Sheet for Patients: EntrepreneurPulse.com.au  Fact Sheet for Healthcare Providers: IncredibleEmployment.be  This test is not yet approved or cleared by the [place] FDA and has been authorized for detection and/or diagnosis of SARS-CoV-2 by FDA under an Emergency Use Authorization (EUA). This EUA will remain in effect (meaning this test can be used) for the duration of the COVID-19 declaration under Section 564(b)(1) of the Act, 21 U.S.C. section 360bbb-3(b)(1), unless the authorization is terminated or revoked.  Performed at [facility], [address] [place], [place] [address]   Culture, blood (routine x 2)     Status: None (Preliminary result)   Collection Time: 03/27/22  1:45 PM   Specimen: BLOOD LEFT HAND  Result Value Ref Range Status   Specimen Description BLOOD LEFT HAND  Final   Special Requests   Final    BOTTLES DRAWN AEROBIC AND  ANAEROBIC Blood Culture adequate volume   Culture   Final    NO GROWTH < 24 HOURS Performed at [facility], [address] [place], [place] [address]    Report Status PENDING  Incomplete  Culture, blood (routine x 2)     Status: None (Preliminary result)   Collection Time: 03/27/22  1:45 PM   Specimen: BLOOD RIGHT HAND  Result Value Ref Range Status   Specimen Description BLOOD RIGHT HAND  Final   Special Requests   Final    BOTTLES DRAWN AEROBIC AND ANAEROBIC Blood Culture adequate volume   Culture   Final    NO GROWTH < 24 HOURS Performed at [facility], [address] [place], [place] [address]    Report Status PENDING  Incomplete  MRSA Next  Gen by PCR, Nasal     Status: None   Collection Time: 03/27/22  6:30 PM   Specimen: Nasal Mucosa; Nasal Swab  Result Value Ref Range Status   MRSA by PCR Next Gen NOT DETECTED NOT DETECTED Final    Comment: (NOTE) The GeneXpert MRSA Assay (FDA approved for NASAL specimens only), is one component of a comprehensive MRSA colonization surveillance program. It is not intended to diagnose MRSA infection nor to guide or monitor treatment for MRSA infections. Test performance is not FDA approved in patients less than 27 years old. Performed at [facility], [address] [place], [place] [address]      Scheduled Meds:  amitriptyline  100 mg Oral QHS   aspirin EC  81 mg Oral Daily   atorvastatin  80 mg Oral QHS   azithromycin  500 mg Oral Q24H   budesonide (PULMICORT) nebulizer solution  0.5 mg Nebulization BID   chlorhexidine  15 mL Mouth Rinse BID   Chlorhexidine Gluconate Cloth  6 each Topical Daily   enoxaparin (LOVENOX) injection  75 mg Subcutaneous Q24H   furosemide  40 mg Intravenous Once   insulin aspart  0-9 Units Subcutaneous Q6H   ipratropium-albuterol  3 mL Nebulization Q6H   mouth rinse  15 mL Mouth Rinse q12n4p   methylPREDNISolone (SOLU-MEDROL) injection  60 mg Intravenous Q12H   pregabalin  200 mg Oral BID    Continuous Infusions:  Procedures/Studies: NM Pulmonary Perf and Vent  Result Date: 03/28/2022 CLINICAL DATA:  Acute hypoxic and hypercapnic respiratory failure, lung cancer history, chest pain. EXAM: NUCLEAR MEDICINE PERFUSION LUNG SCAN TECHNIQUE: Perfusion images were obtained in multiple projections after intravenous injection of radiopharmaceutical. Ventilation scans intentionally deferred if perfusion scan and chest x-ray adequate for interpretation during COVID 19 epidemic. RADIOPHARMACEUTICALS:  4.4 mCi Tc-45m MAA IV COMPARISON:  Chest x-ray 03/27/2022, CT 10/26/2021 FINDINGS: Moderate-sized perfusion defect involving the posterior segment of the right upper lobe. Corresponding area of radiation fibrosis on chest imaging. Elsewhere, no segmental perfusion defect. IMPRESSION: Low probability for pulmonary embolism. Electronically Signed   By: Davina Poke D.O.   On: 03/28/2022 10:32   DG Chest Port 1 View  Result Date: 03/27/2022 CLINICAL DATA:  Hypertension and cough.  History of lung cancer. EXAM: PORTABLE CHEST 1 VIEW COMPARISON:  Chest CT October 26, 2021 FINDINGS: Right chest wall Port-A-Cath with tip near the superior cavoatrial junction. Partially visualized spinal stimulating generator lead. The heart size and mediastinal contours are within normal limits. Aortic atherosclerosis. 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 No new focal airspace consolidations. No pleural effusion. No pneumothorax. The visualized skeletal structures are unremarkable. IMPRESSION: 1. 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 2. No new focal airspace consolidations. Electronically Signed   By: Dahlia Bailiff M.D.   On: 03/27/2022 13:23    Orson Eva, DO  [facility]  If 7PM-7AM, please  contact night-coverage [email or web address] Password [facility] 03/29/2022, 8:39 AM   LOS: 2 days

## 2022-03-29 NOTE — Evaluation (Signed)
Physical Therapy Evaluation Patient Details Name: Alexander Hatfield. MRN: [id number] DOB: [birth year] Today's Date: 03/29/2022  History of Present Illness  Alexander Knippel. is a 66 y.o. male with medical history significant for COPD with Chronic respiratory failure, OSA, lung cancer, hypertension, diastolic CHF.  Patient presented to the ED from primary care provider's office with reports of hypotension and cough.  Patient has chronic difficulty breathing and cough, but tells me that today his breathing got worse.  His cough is worse and productive of yellowish sputum.  Reports intermittent right-sided chest pain which he thinks is related to his cough.  He has lower extremity swelling, most of which is chronic.  No fevers no chills.  Patient's wife of 65 years just passed away yesterday from COPD, he tells me she was not intubated, she was DNR.  Patient's son [name] drove today from [place], and is present at bedside.  Patient is supposed to be on CPAP but he has not used in at least 2 to 3 weeks, he tells me because of increasing discomfort.  He also tells me he is not on home O2.  Patient was sleeping and snoring/somnolent earlier in the ED, hence there was a concern for hypercapnia.   Clinical Impression  Patient demonstrated ability to perform supine to sit and sit to supine bed mobility with modified independence but needing min guard/supervision assist for safety. Patient was able to perform sit to stand and chair to bed transfers with min guard and supervision with no significant strength or balance deficits observed. Patient was able to ambualte for 50 feet with min guard/supervision and vitals assessed. Patient was primarily limited by fatigue, but averaged around 93% SpO2 during assessment while on 2 L of oxygen.  Patient began to experience more fatigue towards the end of gait assessment with vitals dropping to 85% SpO2 in which patient required a rest period for vitals to resume safe therapeutic  ranges. Patient was able to reach 94% SpO2 on 3 L of oxygen before returning to chair in room with nursing staff notified of mobility status. Patient will benefit from continued skilled physical therapy in hospital and recommended venue below to increase strength, balance, endurance for safe ADLs and gait.      Recommendations for follow up therapy are one component of a multi-disciplinary discharge planning process, led by the attending physician.  Recommendations may be updated based on patient status, additional functional criteria and insurance authorization.  Follow Up Recommendations Home health PT    Assistance Recommended at Discharge PRN  Patient can return home with the following  A little help with walking and/or transfers;Help with stairs or ramp for entrance    Equipment Recommendations None recommended by PT  Recommendations for Other Services       Functional Status Assessment Patient has had a recent decline in their functional status and demonstrates the ability to make significant improvements in function in a reasonable and predictable amount of time.     Precautions / Restrictions Precautions Precautions: None Restrictions Weight Bearing Restrictions: No      Mobility  Bed Mobility Overal bed mobility: Needs Assistance Bed Mobility: Supine to Sit     Supine to sit: Supervision, Min guard     General bed mobility comments: Patient was able to perform supine to sit and sit to supine bed mobility independently but needed min guard/supervision for safety    Transfers Overall transfer level: Needs assistance   Transfers: Sit to/from Stand Sit to  Stand: Supervision, Min guard           General transfer comment: Patient was able to perform sit to stand transfer with min guard/superivison assist. Forward kyphotic posture noted, slightly impacting patient's balancing ability in standing.    Ambulation/Gait Ambulation/Gait assistance: Min guard,  Supervision Gait Distance (Feet): 50 Feet   Gait Pattern/deviations: Step-to pattern, Trunk flexed, Decreased stride length, Decreased step length - right, Decreased step length - left Gait velocity: decreased     General Gait Details: Patient was able to ambulate for 50 feet with min guard/ supervision. Patient vitals were assessed with SpO2 reaching mid 80's at end of gait, but was averaging around 93% prior. Patient was primarily limited by fatigue during assessment.  Stairs            Wheelchair Mobility    Modified Rankin (Stroke Patients Only)       Balance Overall balance assessment: Needs assistance Sitting-balance support: Bilateral upper extremity supported, Feet supported Sitting balance-Leahy Scale: Good Sitting balance - Comments: Patient able to sit EOB supporting himself   Standing balance support: No upper extremity supported, During functional activity Standing balance-Leahy Scale: Fair Standing balance comment: Patient able to perform standing balance with no assistive device and only minor balance deficits observed.                             Pertinent Vitals/Pain Pain Assessment Pain Assessment: 0-10 Pain Score: 6  Pain Location: back Pain Descriptors / Indicators: Aching Pain Intervention(s): Limited activity within patient's tolerance, Monitored during session, Repositioned    Home Living Family/patient expects to be discharged to:: Private residence Living Arrangements: Alone Available Help at Discharge: Family Type of Home: House Home Access: Level entry       Home Layout: Two level Home Equipment: Cane - single Barista (2 wheels);Wheelchair - manual      Prior Function Prior Level of Function : Independent/Modified Independent             Mobility Comments: Patient reports being able to ambulate independently at home but 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 Patient was not driving previously ADLs Comments:  Patient reports being independent with all ADL's and functional tasks.     Hand Dominance        Extremity/Trunk Assessment   Upper Extremity Assessment Upper Extremity Assessment: Overall WFL for tasks assessed    Lower Extremity Assessment Lower Extremity Assessment: Overall WFL for tasks assessed    Cervical / Trunk Assessment Cervical / Trunk Assessment: Normal  Communication   Communication: No difficulties  Cognition Arousal/Alertness: Awake/alert Behavior During Therapy: WFL for tasks assessed/performed Overall Cognitive Status: Within Functional Limits for tasks assessed                                          General Comments      Exercises     Assessment/Plan    PT Assessment Patient needs continued PT services;All further PT needs can be met in the next venue of care  PT Problem List Decreased activity tolerance;Decreased balance;Decreased mobility;Decreased coordination;Decreased strength       PT Treatment Interventions DME instruction;Gait training;Functional mobility training;Therapeutic activities;Therapeutic exercise;Balance training    PT Goals (Current goals can be found in the Care Plan section)  Acute Rehab PT Goals Patient Stated Goal: return home PT Goal  Formulation: With patient Time For Goal Achievement: 04/12/22 Potential to Achieve Goals: Good    Frequency Min 3X/week     Co-evaluation               AM-PAC PT "6 Clicks" Mobility  Outcome Measure Help needed turning from your back to your side while in a flat bed without using bedrails?: None Help needed moving from lying on your back to sitting on the side of a flat bed without using bedrails?: None Help needed moving to and from a bed to a chair (including a wheelchair)?: A Little Help needed standing up from a chair using your arms (e.g., wheelchair or bedside chair)?: A Little Help needed to walk in hospital room?: A Little Help needed climbing 3-5  steps with a railing? : A Little 6 Click Score: 20    End of Session Equipment Utilized During Treatment: Gait belt Activity Tolerance: Patient tolerated treatment well;Patient limited by fatigue Patient left: in chair Nurse Communication: Mobility status PT Visit Diagnosis: Unsteadiness on feet (R26.81);Other abnormalities of gait and mobility (R26.89);Muscle weakness (generalized) (M62.81)    Time: 7473-4037 PT Time Calculation (min) (ACUTE ONLY): 27 min   Charges:   PT Evaluation $PT Eval Moderate Complexity: 1 Mod PT Treatments $Therapeutic Activity: 23-37 mins        3:42 PM, 03/29/22 Lestine Box, S/PT

## 2022-03-29 NOTE — Plan of Care (Cosign Needed)
  Problem: Acute Rehab PT Goals(only PT should resolve) Goal: Pt Will Go Supine/Side To Sit Outcome: Progressing Flowsheets (Taken 03/29/2022 1543) Pt will go Supine/Side to Sit:  with modified independence  with supervision Goal: Patient Will Perform Sitting Balance Outcome: Progressing Flowsheets (Taken 03/29/2022 1543) Patient will perform sitting balance:  Independently  with modified independence Goal: Patient Will Transfer Sit To/From Stand Outcome: Progressing Flowsheets (Taken 03/29/2022 1543) Patient will transfer sit to/from stand:  with modified independence  with supervision Goal: Pt Will Perform Standing Balance Or Pre-Gait Outcome: Progressing Flowsheets (Taken 03/29/2022 1543) Pt will perform standing balance or pre-gait:  with Supervision  with min guard assist Goal: Pt Will Ambulate Outcome: Progressing Flowsheets (Taken 03/29/2022 1543) Pt will Ambulate:  > 125 feet  with min guard assist  with supervision  3:44 PM, 03/29/22 Lestine Box, S/PT

## 2022-03-29 NOTE — Progress Notes (Signed)
Pt on BIPAP for the night RT will continue to monitor

## 2022-03-30 DIAGNOSIS — G9340 Encephalopathy, unspecified: Secondary | ICD-10-CM | POA: Diagnosis not present

## 2022-03-30 DIAGNOSIS — E119 Type 2 diabetes mellitus without complications: Secondary | ICD-10-CM | POA: Diagnosis not present

## 2022-03-30 DIAGNOSIS — J9601 Acute respiratory failure with hypoxia: Secondary | ICD-10-CM | POA: Diagnosis not present

## 2022-03-30 LAB — BASIC METABOLIC PANEL
Anion gap: 8 (ref 5–15)
BUN: 21 mg/dL (ref 8–23)
CO2: 28 mmol/L (ref 22–32)
Calcium: 8.9 mg/dL (ref 8.9–10.3)
Chloride: 107 mmol/L (ref 98–111)
Creatinine, Ser: 0.89 mg/dL (ref 0.61–1.24)
GFR, Estimated: >60 mL/min (ref >60)
Glucose, Bld: 146 mg/dL — ABNORMAL HIGH (ref 70–99)
Potassium: 3.8 mmol/L (ref 3.5–5.1)
Sodium: 143 mmol/L (ref 135–145)

## 2022-03-30 LAB — GLUCOSE, CAPILLARY
Glucose-Capillary: 140 mg/dL — ABNORMAL HIGH (ref 70–99)
Glucose-Capillary: 142 mg/dL — ABNORMAL HIGH (ref 70–99)
Glucose-Capillary: 145 mg/dL — ABNORMAL HIGH (ref 70–99)

## 2022-03-30 LAB — MAGNESIUM: Magnesium: 2.2 mg/dL (ref 1.7–2.4)

## 2022-03-30 MED ORDER — AZITHROMYCIN 500 MG PO TABS
500.0000 mg | ORAL_TABLET | Freq: Every day | ORAL | 0 refills | Status: AC
Start: 2022-03-30 — End: ?

## 2022-03-30 MED ORDER — TORSEMIDE 20 MG PO TABS
40.0000 mg | ORAL_TABLET | Freq: Once | ORAL | Status: AC
  Administered 2022-03-30: 40 mg via ORAL
  Filled 2022-03-30: qty 2

## 2022-03-30 MED ORDER — PREDNISONE 10 MG PO TABS: 60.0000 mg | ORAL_TABLET | Freq: Every day | ORAL | 0 refills | Status: AC

## 2022-03-30 MED ORDER — PREDNISONE 20 MG PO TABS
60.0000 mg | ORAL_TABLET | Freq: Every day | ORAL | Status: DC
Start: 2022-03-31 — End: 2022-03-30

## 2022-03-30 NOTE — TOC Transition Note (Signed)
Transition of Care [facility]) - CM/SW Discharge Note   Patient Details  Name: Alexander Hatfield. MRN: [id number] Date of Birth: July 04, 1956  [facility] CM/SW Contact:  Shade Flood, LCSW Phone Number: 03/30/2022, 11:23 AM   Clinical Narrative:     Pt stable for dc with Home O2 today per MD. Damaris Schooner with pt, his sister, and his son to review dc needs. Pt's wife passed away three days ago and pt's son is moving pt to [place] to live with him. Tentative plan is for pt to travel with his son by car to [place] next week Wednesday. National CMS O2 provider options reviewed and referral made to [facility].  Pt will have portable tank for dc and [facility] will deliver POC and home concentrator to pt's home today within 3 hours of dc. [employer] will coordinate with pt's son for the travel needs and setup in son's home in [place].  Pt and family aware of plan and in agreement. PT recommended HHPT for pt though pt stated he is going to be busy with funeral arrangements and then moving so he will wait to have PT until he gets settled in [place].  There are no other TOC needs for dc.  Expected Discharge Plan: Home/Self Care Barriers to Discharge: Barriers Resolved   Patient Goals and CMS Choice Patient states their goals for this hospitalization and ongoing recovery are:: go home CMS Medicare.gov Compare Post Acute Care list provided to:: Patient Choice offered to / list presented to : Patient  Expected Discharge Plan and Services Expected Discharge Plan: Home/Self Care In-house Referral: Clinical Social Work   Post Acute Care Choice: Durable Medical Equipment Living arrangements for the past 2 months: Single Family Home Expected Discharge Date: 03/30/22               DME Arranged: Oxygen DME Agency: [facility] Date DME Agency Contacted: 03/30/22   Representative spoke with at DME Agency: Caryl Pina            Prior Living Arrangements/Services Living arrangements for the past 2 months: [place] Lives with:: Self Patient language and need for interpreter reviewed:: Yes Do you feel safe going back to the place where you live?: Yes      Need for Family Participation in Patient Care: No (Comment)     Criminal Activity/Legal Involvement Pertinent to Current Situation/Hospitalization: No - Comment as needed  Activities of Daily Living Home Assistive Devices/Equipment: Cane (specify quad or straight), Dentures (specify type), Grab bars around toilet, Grab bars in shower, Walker (specify type) (straight caen, Partial upper denture, front wheel ealker) ADL Screening (condition at time of admission) Patient's cognitive ability adequate to safely complete daily activities?: Yes Is the patient deaf or have difficulty hearing?: Yes Does the patient have difficulty seeing, even when wearing glasses/contacts?: No Does the patient have difficulty concentrating, remembering, or making decisions?: No Patient able to express need for assistance with ADLs?: Yes Does the patient have difficulty dressing or bathing?: No Independently performs ADLs?: Yes (appropriate for developmental age) Does the patient have difficulty walking or climbing stairs?: Yes Weakness of Legs: Both Weakness of Arms/Hands: None  Permission Sought/Granted Permission sought to share information with : Facility Art therapist granted to share information with : Yes, Verbal Permission Granted     Permission granted to share info w AGENCY: [facility]        Emotional Assessment Appearance:: Appears stated age Attitude/Demeanor/Rapport: Engaged Affect (typically observed): Pleasant Orientation: : Oriented to  Self, Oriented to Place, Oriented to  Time, Oriented to Situation Alcohol / Substance Use: Not Applicable Psych Involvement: No (comment)  Admission diagnosis:  SOB (shortness of breath) [R06.02] Acute respiratory failure with hypoxia (HCC) [J96.01] COPD with acute exacerbation (HCC)  [J44.1] Patient Active Problem List   Diagnosis Date Noted   COPD with acute exacerbation ([place]) 03/27/2022   Acute respiratory failure with hypoxia and hypercapnia (HCC) 03/27/2022   Acute encephalopathy 03/27/2022   Controlled type 2 diabetes mellitus without complication, without long-term current use of insulin ([place]) 03/27/2022   COPD with chronic bronchitis and emphysema ([place]) 01/18/2022   Chronic respiratory failure with hypoxia ([place]) 01/18/2022   Hyperlipidemia 12/07/2021   CAD (coronary artery disease) 20/35/5974   Acute diastolic heart failure ([place]) 12/05/2021   Peripheral arterial disease ([place]) 12/04/2019   Encounter for antineoplastic immunotherapy 02/19/2019   Weight gain 02/19/2019   Port-A-Cath in place 12/02/2018   Adenocarcinoma of right lung, stage 3 ([place]) 11/20/2018   Goals of care, counseling/discussion 11/20/2018   Encounter for antineoplastic chemotherapy 11/20/2018   Lung cancer ([place]) 11/04/2018   Myelopathy ([place]) 10/27/2018   Syncope 07/02/2017   Obstructive sleep apnea 07/02/2017   Morbid obesity ([place]) 07/02/2017   Chronic back pain 07/02/2017   Essential hypertension 07/02/2017   PCP:  Celene Squibb, MD Pharmacy:   [facility] [place], [address] [place] [place] [address] Phone: [phone] Fax: [phone]  [place], [place] - [address] [place] [address] [place] [address] [place] [address] Phone: [phone] Fax: [phone]  [id number] - [place], [place] [address] [place] [address] Phone: [phone] Fax: [phone]     Social Determinants of Health ([place]) Interventions    Readmission Risk Interventions    03/30/2022   11:22 AM  Readmission Risk Prevention Plan  Medication Screening Complete  Transportation Screening Complete     Final next level of care: Home/Self Care Barriers to Discharge:  Barriers Resolved   Patient Goals and CMS Choice Patient states their goals for this hospitalization and ongoing recovery are:: go home CMS Medicare.gov Compare Post Acute Care list provided to:: Patient Choice offered to / list presented to : Patient  Discharge Placement                       Discharge Plan and Services In-house Referral: Clinical Social Work   Post Acute Care Choice: Durable Medical Equipment          DME Arranged: Oxygen DME Agency: [facility] Date DME Agency Contacted: 03/30/22   Representative spoke with at DME Agency: Caryl Pina            Social Determinants of Health ([place]) Interventions     Readmission Risk Interventions    03/30/2022   11:22 AM  Readmission Risk Prevention Plan  Medication Screening Complete  Transportation Screening Complete

## 2022-03-30 NOTE — Progress Notes (Signed)
O2 delivered to room and sister present to give ride home.  Discharge instructions reviewed with patient and scripts sent to pharmacy.  IV removed and transport by WC to entrance.

## 2022-03-30 NOTE — Progress Notes (Signed)
SATURATION QUALIFICATIONS: (This note is used to comply with regulatory documentation for home oxygen)   Patient Saturations on Room Air at Rest = 92   Patient Saturations on Room Air while Ambulating = 84   Patient Saturations on 2 Liters of oxygen while Ambulating = 95   Please briefly explain why patient needs home oxygen: To maintain 02 sat at 90% or above during ambulation.   Orson Eva, DO

## 2022-03-30 NOTE — Discharge Summary (Addendum)
Physician Discharge Summary   Patient: Alexander Hatfield. MRN: [id number] DOB: 09-Jul-1956  Admit date:     03/27/2022  Discharge date: 03/30/22  Discharge Physician: Shanon Brow [place]aclara Acklin   PCP: Celene Squibb, MD   Recommendations at discharge:   Please follow up with primary care provider within 1-2 weeks  Please repeat BMP and CBC in one week     Hospital Course: 66 year old male with a history of COPD, chronic respiratory failure, squamous cell carcinoma of the lung, hypertension, diastolic CHF presented to the ED from the primary care provider's office secondary to low BP and coughing.  The patient had been complaining of shortness of breath and coughing that was worse than his usual.  He complained of yellow sputum production.  He also complained of intermittent right-sided pleuritic chest pain which he felt was due to his cough.  In addition, the patient has been complaining of lower extremity edema which he states is about the same as usual.  He denies any fevers, chills, nausea, vomiting, diarrhea, abdominal pain. Patient's wife of 80 years just passed away yesterday from COPD, he tells me she was not intubated, she was DNR.  Patient's son [name] drove today from [place], and is present at bedside. Patient is supposed to be on CPAP but he has not used in at least 2 to 3 weeks, he tells me because of increasing discomfort.  He also tells me he is not on home O2. Patient was sleeping and snoring/somnolent earlier in the ED, hence there was a concern for hypercapnia.   ED Course: Afebrile temperature 98.9.  Heart rate 70s to 80s.  Respiratory rate 16-28.  Blood pressure systolic 38-182.  O2 sats 75% on room air, ABG showed pH of 7.3, PCO2 of 69, PO2 of 56.  He was placed on BiPAP.  Troponin 14 > 11.  WBC 13.  Chest x-ray shows post treatment with left-sided radiation fibrosis  similar prior otherwise without acute abnormality.   500 mill bolus given with improvement in blood pressure. IV Solu-Medrol 125  mg given.  Assessment and Plan: * Acute respiratory failure with hypoxia and hypercapnia (HCC) 03/27/2029 VBG--7.3 1/69/56/34 on RA Secondary to COPD exacerbation Initially on BiPAP Weaned to 2 L nasal cannula VQ scan--low probability Give lasix IV x1 5/18 Desaturated to 84% with ambulation on day of d/c>>d/ home with 2L [place]  COPD with acute exacerbation (HCC) O2 sats down to 75% on room air, pH of 7.3 and PCO2 of 69.   Personally reviewed CXR stable postradiation fibrosis.   -IV Solu-Medrol 125 mg given, continue 60 twice daily>>d/c home with prednisone taper -DuoNebs as needed and scheduled -weaned off BIPAP -Repeat ABG 7.37/58/56/33 (0.4) -continue azithromycin>>2 more days after dc -Mucolytic's - V/Q scan--low probability D/c home with prednisone taper  Obstructive sleep apnea Has not used his CPAP in 2 to 3 weeks, reports discomfort with CPAP.  Morbid obesity ([name]) BMI 44.97 Lifestyle modification  Controlled type 2 diabetes mellitus without complication, without long-term current use of insulin ([name]) 12/14/21  A1c 6.1. - SSI- S -Hold metformin   Acute encephalopathy Initial somnolence reported by ED provider  -Some degree of CO2 narcosis, P CO2- 69. -weaned off BiPAP -Limit home opiates -improved  CAD (coronary artery disease) Reported chest pain,  EKG-personally reviewed sinus, no ST ST wave changes -troponins unremarkable.   -Cardiac cath 12/05/2021, diffuse stenosis ranging from 25 to 80%.  Recommendations was for medical therapy considering patient's multiple comorbidities, per note- ostial LAD  lesion and RCA disease would best be treated with bypass surgery. - V/Q scan--low probability -Hold carvedilol, Entresto, Norvasc for now with initial hypotension -Resume aspirin, statin  Port-A-Cath in place Continue routine line care  Lung cancer ([name]) Squamous cell cancer diagnosed in 2019.  Follows with Dr. Earlie Server.  Status post chemoradiation, immunotherapy.   Currently under observation.  Essential hypertension Initial hypotension, improved after 500 mill bolus -Hold Norvasc, Coreg, Entresto for now. -Does not appear patient is taking Entresto -restart coreg after d/c  Chronic back pain PDMP reviewed Pt receives oxyIR 15 mg, #120 monthly         Consultants: none Procedures performed: none  Disposition: Home Diet recommendation:  Carb modified diet DISCHARGE MEDICATION: Allergies as of 03/30/2022       Reactions   Bee Venom Other (See Comments)   Dizzy/ swim head   Iohexol Other (See Comments)    Desc: PT STATES THAT WHEN GIVEN THE CONTRAST HIS LEGS BURN AND HE PASSES OUT, COMA FOR 7DAY. DR Thornton Papas WOULD NOT PRE MEDICATE DUE TO SEVERITY OF REACTION, STUDY WAS CX Isovue Preservative caused the problem        Medication List     STOP taking these medications    amLODipine 10 MG tablet Commonly known as: NORVASC   Entresto 24-26 MG Generic drug: sacubitril-valsartan       TAKE these medications    albuterol 108 (90 Base) MCG/ACT inhaler Commonly known as: VENTOLIN HFA Inhale 2 puffs into the lungs every 6 (six) hours as needed for wheezing or shortness of breath.   amitriptyline 100 MG tablet Commonly known as: ELAVIL Take 100 mg by mouth at bedtime.   Aspirin Low Dose 81 MG tablet Generic drug: aspirin EC Take 1 tablet (81 mg total) by mouth daily. Swallow whole.   atorvastatin 80 MG tablet Commonly known as: LIPITOR Take 1 tablet (80 mg total) by mouth at bedtime.   azithromycin 500 MG tablet Commonly known as: ZITHROMAX Take 1 tablet (500 mg total) by mouth daily.   baclofen 10 MG tablet Commonly known as: LIORESAL Take 10 mg by mouth 3 (three) times daily.   Breo Ellipta 200-25 MCG/ACT Aepb Generic drug: fluticasone furoate-vilanterol Inhale 1 puff into the lungs daily as needed (asthma).   carvedilol 3.125 MG tablet Commonly known as: COREG Take 1 tablet (3.125 mg total) by mouth 2 (two)  times daily.   furosemide 20 MG tablet Commonly known as: LASIX Take 20 mg by mouth daily.   ipratropium-albuterol 0.5-2.5 (3) MG/3ML Soln Commonly known as: DUONEB Inhale 3 mLs into the lungs every 4 (four) hours as needed (shortness of breath).   meloxicam 15 MG tablet Commonly known as: MOBIC Take 15 mg by mouth daily.   metFORMIN 500 MG tablet Commonly known as: GLUCOPHAGE Take 500 mg by mouth daily.   Narcan 4 MG/0.1ML Liqd nasal spray kit Generic drug: naloxone Place 1 spray into the nose See admin instructions. Administer a single spray in one nostril upon signs of opioid overdose.  Call 911.  Repeat after 3 minutes if no response.   oxyCODONE 15 MG immediate release tablet Commonly known as: ROXICODONE Take 15 mg by mouth 4 (four) times daily.   potassium chloride SA 20 MEQ tablet Commonly known as: KLOR-CON M Take 20 mEq by mouth every evening.   predniSONE 10 MG tablet Commonly known as: DELTASONE Take 6 tablets (60 mg total) by mouth daily with breakfast. And decrease by one tablet daily Start taking  on: Mar 31, 2022   pregabalin 200 MG capsule Commonly known as: LYRICA Take 200 mg by mouth 2 (two) times daily.               Durable Medical Equipment  (From admission, onward)           Start     Ordered   03/30/22 0918  For home use only DME oxygen  Once       Question Answer Comment  Length of Need 6 Months   Liters per Minute 2   Frequency Continuous (stationary and portable oxygen unit needed)   Oxygen conserving device Yes   Oxygen delivery system Gas      03/30/22 0917            Discharge Exam: Filed Weights   03/27/22 1823 03/28/22 0553 03/29/22 0400  Weight: (!) 153.6 kg (!) 154.6 kg (!) 152.7 kg   HEENT:  [place]/AT, No thrush, no icterus CV:  RRR, no rub, no S3, no S4 Lung:  bibasilar rales.  No wheeze Abd:  soft/+BS, NT Ext:  trace LE edema, no lymphangitis, no synovitis, no rash   Condition at discharge:  stable  The results of significant diagnostics from this hospitalization (including imaging, microbiology, ancillary and laboratory) are listed below for reference.   Imaging Studies: NM Pulmonary Perf and Vent  Result Date: 03/28/2022 CLINICAL DATA:  Acute hypoxic and hypercapnic respiratory failure, lung cancer history, chest pain. EXAM: NUCLEAR MEDICINE PERFUSION LUNG SCAN TECHNIQUE: Perfusion images were obtained in multiple projections after intravenous injection of radiopharmaceutical. Ventilation scans intentionally deferred if perfusion scan and chest x-ray adequate for interpretation during COVID 19 epidemic. RADIOPHARMACEUTICALS:  4.4 mCi Tc-85mMAA IV COMPARISON:  Chest x-ray 03/27/2022, CT 10/26/2021 FINDINGS: Moderate-sized perfusion defect involving the posterior segment of the right upper lobe. Corresponding area of radiation fibrosis on chest imaging. Elsewhere, no segmental perfusion defect. IMPRESSION: Low probability for pulmonary embolism. Electronically Signed   By: NDavina PokeD.O.   On: 03/28/2022 10:32   DG Chest Port 1 View  Result Date: 03/27/2022 CLINICAL DATA:  Hypertension and cough.  History of lung cancer. EXAM: PORTABLE CHEST 1 VIEW COMPARISON:  Chest CT October 26, 2021 FINDINGS: Right chest wall Port-A-Cath with tip near the superior cavoatrial junction. Partially visualized spinal stimulating generator lead. The heart size and mediastinal contours are within normal limits. Aortic atherosclerosis. 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 No new focal airspace consolidations. No pleural effusion. No pneumothorax. The visualized skeletal structures are unremarkable. IMPRESSION: 1. 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 2. No new focal airspace  consolidations. Electronically Signed   By: JDahlia BailiffM.D.   On: 03/27/2022 13:23    Microbiology: Results for orders placed or performed during the hospital encounter of 03/27/22  Resp Panel by RT-PCR (Flu A&B, Covid) Nasopharyngeal Swab     Status: None   Collection Time: 03/27/22  1:12 PM   Specimen: Nasopharyngeal Swab; Nasopharyngeal(NP) swabs in vial transport medium  Result Value Ref Range Status   SARS Coronavirus 2 by RT PCR NEGATIVE NEGATIVE Final    Comment: (NOTE) SARS-CoV-2 target nucleic acids are NOT DETECTED.  The SARS-CoV-2 RNA is generally detectable in upper respiratory specimens during the acute phase of infection. The lowest concentration of SARS-CoV-2 viral copies this assay can detect  is 138 copies/mL. A negative result does not preclude SARS-Cov-2 infection and should not be used as the sole basis for treatment or other patient management decisions. A negative result may occur with  improper specimen collection/handling, submission of specimen other than nasopharyngeal swab, presence of viral mutation(s) within the areas targeted by this assay, and inadequate number of viral copies(<138 copies/mL). A negative result must be combined with clinical observations, patient history, and epidemiological information. The expected result is Negative.  Fact Sheet for Patients:  EntrepreneurPulse.com.au  Fact Sheet for Healthcare Providers:  IncredibleEmployment.be  This test is no t yet approved or cleared by the [place] FDA and  has been authorized for detection and/or diagnosis of SARS-CoV-2 by FDA under an Emergency Use Authorization (EUA). This EUA will remain  in effect (meaning this test can be used) for the duration of the COVID-19 declaration under Section 564(b)(1) of the Act, 21 U.S.C.section 360bbb-3(b)(1), unless the authorization is terminated  or revoked sooner.       Influenza A by PCR NEGATIVE  NEGATIVE Final   Influenza B by PCR NEGATIVE NEGATIVE Final    Comment: (NOTE) The Xpert Xpress SARS-CoV-2/FLU/RSV plus assay is intended as an aid in the diagnosis of influenza from Nasopharyngeal swab specimens and should not be used as a sole basis for treatment. Nasal washings and aspirates are unacceptable for Xpert Xpress SARS-CoV-2/FLU/RSV testing.  Fact Sheet for Patients: EntrepreneurPulse.com.au  Fact Sheet for Healthcare Providers: IncredibleEmployment.be  This test is not yet approved or cleared by the [place] FDA and has been authorized for detection and/or diagnosis of SARS-CoV-2 by FDA under an Emergency Use Authorization (EUA). This EUA will remain in effect (meaning this test can be used) for the duration of the COVID-19 declaration under Section 564(b)(1) of the Act, 21 U.S.C. section 360bbb-3(b)(1), unless the authorization is terminated or revoked.  Performed at [facility], [address], [place], [place] [address]   Culture, blood (routine x 2)     Status: None (Preliminary result)   Collection Time: 03/27/22  1:45 PM   Specimen: BLOOD LEFT HAND  Result Value Ref Range Status   Specimen Description BLOOD LEFT HAND  Final   Special Requests   Final    BOTTLES DRAWN AEROBIC AND ANAEROBIC Blood Culture adequate volume   Culture   Final    NO GROWTH 3 DAYS Performed at [facility], [address], [place], [place] [address]    Report Status PENDING  Incomplete  Culture, blood (routine x 2)     Status: None (Preliminary result)   Collection Time: 03/27/22  1:45 PM   Specimen: BLOOD RIGHT HAND  Result Value Ref Range Status   Specimen Description BLOOD RIGHT HAND  Final   Special Requests   Final    BOTTLES DRAWN AEROBIC AND ANAEROBIC Blood Culture adequate volume   Culture   Final    NO GROWTH 3 DAYS Performed at [facility], [address] [place], [place] [address]    Report Status PENDING  Incomplete  MRSA  Next Gen by PCR, Nasal     Status: None   Collection Time: 03/27/22  6:30 PM   Specimen: Nasal Mucosa; Nasal Swab  Result Value Ref Range Status   MRSA by PCR Next Gen NOT DETECTED NOT DETECTED Final    Comment: (NOTE) The GeneXpert MRSA Assay (FDA approved for NASAL specimens only), is one component of a comprehensive MRSA colonization surveillance program. It is not intended to diagnose MRSA infection  nor to guide or monitor treatment for MRSA infections. Test performance is not FDA approved in patients less than 67 years old. Performed at [facility], [address], [place], [place] [address]     Labs: CBC: Recent Labs  Lab 03/27/22 1337 03/28/22 0434 03/29/22 0632  WBC 13.0* 7.7 12.1*  NEUTROABS 9.9*  --   --   HGB 15.0 14.3 14.3  HCT 47.4 45.6 47.1  MCV 94.6 93.8 97.5  PLT 251 230 111   Basic Metabolic Panel: Recent Labs  Lab 03/27/22 1337 03/28/22 0434 03/29/22 0632 03/30/22 0529  NA 140 140 142 143  K 3.9 3.6 3.9 3.8  CL 104 106 108 107  CO2 _0 GLUCOSE 114* 168* 138* 146*  BUN _1 CREATININE 1.17 0.94 0.85 0.89  CALCIUM 9.2 8.7* 8.8* 8.9  MG  --   --  2.3 2.2   Liver Function Tests: No results for input(s): AST, ALT, ALKPHOS, BILITOT, PROT, ALBUMIN in the last 168 hours. CBG: Recent Labs  Lab 03/29/22 0749 03/29/22 1125 03/29/22 1701 03/30/22 0025 03/30/22 0504  GLUCAP 139* 139* 110* 145* 142*    Discharge time spent: greater than 30 minutes.  Signed: Orson Eva, MD [facility] 03/30/2022

## 2022-03-30 NOTE — Plan of Care (Signed)

## 2022-04-01 LAB — CULTURE, BLOOD (ROUTINE X 2)
Culture: NO GROWTH
Culture: NO GROWTH
Special Requests: ADEQUATE
Special Requests: ADEQUATE

## 2022-04-03 ENCOUNTER — Encounter (HOSPITAL_COMMUNITY): Payer: Medicare Other

## 2022-04-04 ENCOUNTER — Ambulatory Visit: Payer: Medicare Other | Admitting: Pulmonary Disease

## 2022-04-05 ENCOUNTER — Encounter (HOSPITAL_COMMUNITY): Payer: Medicare Other

## 2022-04-06 NOTE — Progress Notes (Signed)
Discharge Progress Report  Patient Details  Name: Alexander Hatfield. MRN: [id number] Date of Birth: 09-Mar-1956 Referring Provider:   Flowsheet Row PULMONARY REHAB COPD ORIENTATION from 03/14/2022 in [facility]  Referring Provider Dr. Elsworth Soho        Number of Visits: 1  Reason for Discharge:  Early Exit:  Relocating to [place]   Smoking History:  Social History   Tobacco Use  Smoking Status Former   Types: Cigarettes  Smokeless Tobacco Never  Tobacco Comments   Quit smoking cigarettes 15 years ago    Diagnosis:  COPD with chronic bronchitis and emphysema ([place])  ADL UCSD:  Pulmonary Assessment Scores     Row Name 03/14/22 1430         ADL UCSD   ADL Phase Entry     SOB Score total 64     Rest 1     Walk 4     Stairs 5     Bath 1     Dress 3     Shop 4       CAT Score   CAT Score 19       mMRC Score   mMRC Score 4              Initial Exercise Prescription:  Initial Exercise Prescription - 03/14/22 1400       Date of Initial Exercise RX and Referring Provider   Date 03/14/22    Referring Provider Dr. Elsworth Soho    Expected Discharge Date 08/04/22      Oxygen   Oxygen Continuous    Liters 2    Maintain Oxygen Saturation 88% or higher      NuStep   Level 1    SPM 60    Minutes 39      Prescription Details   Frequency (times per week) 2    Duration Progress to 30 minutes of continuous aerobic without signs/symptoms of physical distress      Intensity   THRR 40-80% of Max Heartrate 67-122    Ratings of Perceived Exertion 11-13    Perceived Dyspnea 0-4      Resistance Training   Training Prescription Yes    Weight 2    Reps 10-15             Discharge Exercise Prescription (Final Exercise Prescription Changes):   Functional Capacity:  6 Minute Walk     Row Name 03/14/22 1409         6 Minute Walk   Phase Initial     Distance 600 feet     Walk Time 6 minutes     # of Rest Breaks 0     MPH 1.13      METS 0.81     RPE 13     Perceived Dyspnea  12     VO2 Peak 2.85     Symptoms Yes (comment)     Comments LB and leg pain after walking for 3 minutes 6/10     Resting HR 68 bpm     Resting BP 102/64     Resting Oxygen Saturation  89 %     Exercise Oxygen Saturation  during 6 min walk 90 %     Max Ex. HR 112 bpm     Max Ex. BP 146/80     2 Minute Post BP 132/74       Interval HR   1 Minute HR 68  2 Minute HR 81     3 Minute HR 92     4 Minute HR 100     5 Minute HR 106     6 Minute HR 108     2 Minute Post HR 83     Interval Heart Rate? Yes       Interval Oxygen   Interval Oxygen? Yes     Baseline Oxygen Saturation % 89 %     1 Minute Oxygen Saturation % 95 %     1 Minute Liters of Oxygen 2 L     2 Minute Oxygen Saturation % 94 %     2 Minute Liters of Oxygen 2 L     3 Minute Oxygen Saturation % 91 %     3 Minute Liters of Oxygen 2 L     4 Minute Oxygen Saturation % 90 %     4 Minute Liters of Oxygen 2 L     5 Minute Oxygen Saturation % 91 %     5 Minute Liters of Oxygen 2 L     6 Minute Oxygen Saturation % 90 %     6 Minute Liters of Oxygen 2 L     2 Minute Post Oxygen Saturation % 98 %     2 Minute Post Liters of Oxygen 2 L              Psychological, QOL, Others - Outcomes: PHQ 2/9:    03/14/2022    2:00 PM  Depression screen PHQ 2/9  Decreased Interest 3  Down, Depressed, Hopeless 0  PHQ - 2 Score 3  Altered sleeping 0  Tired, decreased energy 3  Change in appetite 1  Feeling bad or failure about yourself  0  Trouble concentrating 0  Moving slowly or fidgety/restless 0  Suicidal thoughts 0  PHQ-9 Score 7  Difficult doing work/chores Not difficult at all    Quality of Life:  Quality of Life - 03/14/22 1418       Quality of Life   Select Quality of Life      Quality of Life Scores   Health/Function Pre 16.5 %    Socioeconomic Pre 19.71 %    Psych/Spiritual Pre 24 %    Family Pre 14.4 %    GLOBAL Pre 18.34 %             Personal  Goals: Goals established at orientation with interventions provided to work toward goal.  Personal Goals and Risk Factors at Admission - 03/14/22 1420       Core Components/Risk Factors/Patient Goals on Admission    Weight Management Obesity    Improve shortness of breath with ADL's Yes    Intervention Provide education, individualized exercise plan and daily activity instruction to help decrease symptoms of SOB with activities of daily living.    Expected Outcomes Short Term: Improve cardiorespiratory fitness to achieve a reduction of symptoms when performing ADLs;Long Term: Be able to perform more ADLs without symptoms or delay the onset of symptoms    Diabetes Yes    Intervention Provide education about signs/symptoms and action to take for hypo/hyperglycemia.;Provide education about proper nutrition, including hydration, and aerobic/resistive exercise prescription along with prescribed medications to achieve blood glucose in normal ranges: Fasting glucose 65-99 mg/dL    Expected Outcomes Short Term: Participant verbalizes understanding of the signs/symptoms and immediate care of hyper/hypoglycemia, proper foot care and importance of medication, aerobic/resistive exercise and nutrition plan for blood  glucose control.;Long Term: Attainment of HbA1C < 7%.    Personal Goal Other Yes    Personal Goal Patient wants to improve his breathing, have less SOB with activitiy and be able to play golf again.    Intervention Patient will participate in the PR program with exercise and education and supplement with exercise at home.    Expected Outcomes Patient will complete the program meeting both personal and program goals.              Personal Goals Discharge:   Exercise Goals and Review:  Exercise Goals     Row Name 03/14/22 1414             Exercise Goals   Increase Physical Activity Yes       Intervention Provide advice, education, support and counseling about physical  activity/exercise needs.;Develop an individualized exercise prescription for aerobic and resistive training based on initial evaluation findings, risk stratification, comorbidities and participant's personal goals.       Expected Outcomes Short Term: Attend rehab on a regular basis to increase amount of physical activity.;Long Term: Exercising regularly at least 3-5 days a week.;Long Term: Add in home exercise to make exercise part of routine and to increase amount of physical activity.       Increase Strength and Stamina Yes       Intervention Provide advice, education, support and counseling about physical activity/exercise needs.;Develop an individualized exercise prescription for aerobic and resistive training based on initial evaluation findings, risk stratification, comorbidities and participant's personal goals.       Expected Outcomes Short Term: Increase workloads from initial exercise prescription for resistance, speed, and METs.;Short Term: Perform resistance training exercises routinely during rehab and add in resistance training at home;Long Term: Improve cardiorespiratory fitness, muscular endurance and strength as measured by increased METs and functional capacity (6MWT)       Able to understand and use rate of perceived exertion (RPE) scale Yes       Intervention Provide education and explanation on how to use RPE scale       Expected Outcomes Short Term: Able to use RPE daily in rehab to express subjective intensity level;Long Term:  Able to use RPE to guide intensity level when exercising independently       Able to understand and use Dyspnea scale Yes       Intervention Provide education and explanation on how to use Dyspnea scale       Expected Outcomes Short Term: Able to use Dyspnea scale daily in rehab to express subjective sense of shortness of breath during exertion;Long Term: Able to use Dyspnea scale to guide intensity level when exercising independently       Knowledge and  understanding of Target Heart Rate Range (THRR) Yes       Intervention Provide education and explanation of THRR including how the numbers were predicted and where they are located for reference       Expected Outcomes Short Term: Able to state/look up THRR;Long Term: Able to use THRR to govern intensity when exercising independently;Short Term: Able to use daily as guideline for intensity in rehab       Understanding of Exercise Prescription Yes       Intervention Provide education, explanation, and written materials on patient's individual exercise prescription       Expected Outcomes Short Term: Able to explain program exercise prescription;Long Term: Able to explain home exercise prescription to exercise independently  Exercise Goals Re-Evaluation:   Nutrition & Weight - Outcomes:  Pre Biometrics - 03/14/22 1415       Pre Biometrics   Height 5\' 11"  (1.803 m)    Weight 156.3 kg    Waist Circumference 59 inches    Hip Circumference 53 inches    Waist to Hip Ratio 1.11 %    BMI (Calculated) 48.08    Triceps Skinfold 43 mm    % Body Fat 48.2 %    Grip Strength 37 kg    Flexibility 0 in    Single Leg Stand 0 seconds              Nutrition:  Nutrition Therapy & Goals - 03/14/22 1417       Personal Nutrition Goals   Comments Patient scored 45 on his diet assessment. Handout provided and explained regarding heart healthy choices and DM information. Patient verbalized understanding. We offer educational sessions on heart healthy nutrition with handouts and assistance with RD referral if patient is interested.      Intervention Plan   Intervention Nutrition handout(s) given to patient.    Expected Outcomes Short Term Goal: Understand basic principles of dietary content, such as calories, fat, sodium, cholesterol and nutrients.             Nutrition Discharge:  Nutrition Assessments - 03/14/22 1419       MEDFICTS Scores   Pre Score 45              Education Questionnaire Score:  Knowledge Questionnaire Score - 03/14/22 1419       Knowledge Questionnaire Score   Pre Score 16/18             Pt discharged after 1 session of PR. His wife passed away, so he will be relocating to [place] to live with his son.

## 2022-04-10 ENCOUNTER — Encounter (HOSPITAL_COMMUNITY): Payer: Medicare Other

## 2022-04-12 ENCOUNTER — Encounter (HOSPITAL_COMMUNITY): Payer: Medicare Other

## 2022-04-17 ENCOUNTER — Encounter (HOSPITAL_COMMUNITY): Payer: Medicare Other

## 2022-04-19 ENCOUNTER — Encounter (HOSPITAL_COMMUNITY): Payer: Medicare Other

## 2022-04-24 ENCOUNTER — Encounter (HOSPITAL_COMMUNITY): Payer: Medicare Other

## 2022-04-25 ENCOUNTER — Telehealth: Payer: Self-pay

## 2022-04-25 NOTE — Telephone Encounter (Signed)
Patient's daughter-in-law called about getting a referral for the patient in [place] since he recently relocated there. They would like for him to be seen at the cancer center within [facility] located in [place], [place]. I informed the daughter-in-law that I would send this to Dr. Julien Nordmann for him to review. There were no further questions or concerns.

## 2022-04-26 ENCOUNTER — Encounter (HOSPITAL_COMMUNITY): Payer: Medicare Other

## 2022-04-26 ENCOUNTER — Other Ambulatory Visit: Payer: Self-pay | Admitting: Internal Medicine

## 2022-04-26 ENCOUNTER — Telehealth: Payer: Self-pay | Admitting: Medical Oncology

## 2022-04-26 DIAGNOSIS — C3491 Malignant neoplasm of unspecified part of right bronchus or lung: Secondary | ICD-10-CM

## 2022-04-26 NOTE — Telephone Encounter (Signed)
LVM with re: referral to Dr Deeann Cree   on pts and Alondra Sahni's phone .

## 2022-04-26 NOTE — Telephone Encounter (Addendum)
Referral-Pt is moving back to [place]. I called referral for pt to see :  Dr. Delrae Rend. Amedeo Gory) MD Oncology  [place]  [place], [place], [place]  The only number for Dr Deeann Cree is the pt's PCP office that I called . However the receptionist did not know this doctor.    The receptionist will send the referral request to his PCP who will help with referral .

## 2022-04-27 ENCOUNTER — Other Ambulatory Visit (HOSPITAL_COMMUNITY): Payer: Medicare Other

## 2022-05-01 ENCOUNTER — Inpatient Hospital Stay: Payer: Medicare Other | Attending: Internal Medicine | Admitting: Internal Medicine

## 2022-05-01 ENCOUNTER — Ambulatory Visit: Payer: Medicare Other | Admitting: Internal Medicine

## 2022-05-01 ENCOUNTER — Encounter (HOSPITAL_COMMUNITY): Payer: Medicare Other

## 2022-05-01 ENCOUNTER — Telehealth: Payer: Self-pay

## 2022-05-01 DIAGNOSIS — C3491 Malignant neoplasm of unspecified part of right bronchus or lung: Secondary | ICD-10-CM

## 2022-05-01 NOTE — Progress Notes (Signed)
[facility] [phone]   [phone]  PROGRESS NOTE FOR TELEMEDICINE VISITS  Celene Squibb, MD [address] [place] [address]  I connected withNAME@ on 05/01/22 at  8:30 AM EDT by video enabled telemedicine visit and verified that I am speaking with the correct person using two identifiers.   I discussed the limitations, risks, security and privacy concerns of performing an evaluation and management service by telemedicine and the availability of in-person appointments. I also discussed with the patient that there may be a patient responsible charge related to this service. The patient expressed understanding and agreed to proceed.  Other persons participating in the visit and their role in the encounter: Son  Patient's location: Patient is home in [place] location: [place]  DIAGNOSIS: Stage IIIA (T2b, N2, M0) non-small cell lung cancer, squamous cell carcinoma presented with right upper lobe lung mass in addition to right paratracheal lymphadenopathy diagnosed in December 2019.  The patient also has a hypermetabolic lesion in the left parotid gland that need further evaluation.   PRIOR THERAPY:   1) concurrent chemoradiation with weekly carboplatin for AUC of 2 and paclitaxel 45 mg/M2.  First dose started on 12/02/2018. Status post 6 cycles.  Last cycle was given on January 05, 2019 the patient is receiving radiation closer to home in [place], [place] completed January 12, 2019. 2) Consolidation treatment with immunotherapy with Imfinzi 10 mg/KG IV every 2 weeks.  First dose was given February 05, 2019. Status post 26 cycles.   CURRENT THERAPY:  Observation.  INTERVAL HISTORY: [name]. 66 y.o. male has a MyChart video virtual visit with me today for evaluation of his condition.  His son was available during the visit.  The patient already moved to [place] to be closer to his family and his  father-in-law who is currently having some medical issues.  The patient is feeling fine with no concerning complaints except for the baseline shortness of breath and he is currently on home oxygen.  He also continues to have the chronic back pain.  He did establish care with a primary care physician as well as the pulmonologist and cardiologist.  He is in the process of establishing care with the medical oncologist and I referred him to Dr. Berneta Sages for medical oncology care.  He has been in observation now for several years with imaging studies every 6 months.  He missed his scan this time because of the moving and transfer of care.  The patient denied having any current nausea, vomiting, diarrhea or constipation.  He has no headache or visual changes.  MEDICAL HISTORY: Past Medical History:  Diagnosis Date   Arthritis    Chronic back pain    Chronic back pain    Headache    HOH (hard of hearing)    Hypertension    Lung mass    Pre-diabetes    Sleep apnea    wears cpap   Wears glasses    Wears partial dentures     ALLERGIES:  is allergic to bee venom and iohexol.  MEDICATIONS:  Current Outpatient Medications  Medication Sig Dispense Refill   albuterol (VENTOLIN HFA) 108 (90 Base) MCG/ACT inhaler Inhale 2 puffs into the lungs every 6 (six) hours as needed for wheezing or shortness of breath.     amitriptyline (ELAVIL) 100 MG tablet Take 100 mg by mouth at bedtime.     aspirin 81 MG  EC tablet Take 1 tablet (81 mg total) by mouth daily. Swallow whole. 30 tablet 11   atorvastatin (LIPITOR) 80 MG tablet Take 1 tablet (80 mg total) by mouth at bedtime. 90 tablet 1   azithromycin (ZITHROMAX) 500 MG tablet Take 1 tablet (500 mg total) by mouth daily. 2 tablet 0   baclofen (LIORESAL) 10 MG tablet Take 10 mg by mouth 3 (three) times daily.     BREO ELLIPTA 200-25 MCG/INH AEPB Inhale 1 puff into the lungs daily as needed (asthma).     carvedilol (COREG) 3.125 MG tablet Take 1 tablet (3.125  mg total) by mouth 2 (two) times daily. 180 tablet 3   furosemide (LASIX) 20 MG tablet Take 20 mg by mouth daily.      ipratropium-albuterol (DUONEB) 0.5-2.5 (3) MG/3ML SOLN Inhale 3 mLs into the lungs every 4 (four) hours as needed (shortness of breath).      meloxicam (MOBIC) 15 MG tablet Take 15 mg by mouth daily.      metFORMIN (GLUCOPHAGE) 500 MG tablet Take 500 mg by mouth daily.     NARCAN 4 MG/0.1ML LIQD nasal spray kit Place 1 spray into the nose See admin instructions. Administer a single spray in one nostril upon signs of opioid overdose.  Call 911.  Repeat after 3 minutes if no response.     oxyCODONE (ROXICODONE) 15 MG immediate release tablet Take 15 mg by mouth 4 (four) times daily.      potassium chloride SA (K-DUR,KLOR-CON) 20 MEQ tablet Take 20 mEq by mouth every evening.      predniSONE (DELTASONE) 10 MG tablet Take 6 tablets (60 mg total) by mouth daily with breakfast. And decrease by one tablet daily 21 tablet 0   pregabalin (LYRICA) 200 MG capsule Take 200 mg by mouth 2 (two) times daily.     No current facility-administered medications for this visit.    SURGICAL HISTORY:  Past Surgical History:  Procedure Laterality Date   ABDOMINAL AORTOGRAM W/LOWER EXTREMITY Right 02/18/2020   Procedure: ABDOMINAL AORTOGRAM W/LOWER EXTREMITY;  Surgeon: Lorretta Harp, MD;  Location: [facility];  Service: Cardiovascular;  Laterality: Right;   ANTERIOR CERVICAL DECOMP/DISCECTOMY FUSION N/A 10/27/2018   Procedure: ANTERIOR CERVICAL THREE-FOUR, FOUR-FIVE DECOMPRESSION/DISCECTOMY FUSION TWO LEVELS;  Surgeon: Kary Kos, MD;  Location: [facility];  Service: Neurosurgery;  Laterality: N/A;   ANTERIOR CERVICAL THREE-FOUR, FOUR-FIVE DECOMPRESSION/DISCECTOMY FUSION TWO LEVELS   BACK SURGERY     IR IMAGING GUIDED PORT INSERTION  11/27/2018   MULTIPLE TOOTH EXTRACTIONS     RIGHT/LEFT HEART CATH AND CORONARY ANGIOGRAPHY N/A 12/05/2021   Procedure: RIGHT/LEFT HEART CATH AND CORONARY  ANGIOGRAPHY;  Surgeon: Jettie Booze, MD;  Location: [facility];  Service: Cardiovascular;  Laterality: N/A;   SPINAL CORD STIMULATOR IMPLANT     VIDEO BRONCHOSCOPY N/A 10/29/2018   Procedure: VIDEO BRONCHOSCOPY;  Surgeon: Laurin Coder, MD;  Location: [facility];  Service: Thoracic;  Laterality: N/A;   VIDEO BRONCHOSCOPY WITH ENDOBRONCHIAL ULTRASOUND N/A 10/27/2018   Procedure: VIDEO BRONCHOSCOPY WITH ENDOBRONCHIAL ULTRASOUND;  Surgeon: Laurin Coder, MD;  Location: [facility];  Service: Thoracic;  Laterality: N/A;   VIDEO BRONCHOSCOPY WITH ENDOBRONCHIAL ULTRASOUND N/A 10/29/2018   Procedure: VIDEO BRONCHOSCOPY WITH ENDOBRONCHIAL ULTRASOUND;  Surgeon: Laurin Coder, MD;  Location: [facility];  Service: Thoracic;  Laterality: N/A;    REVIEW OF SYSTEMS:  A comprehensive review of systems was negative except for: Constitutional: positive for fatigue Respiratory: positive for dyspnea  on exertion Musculoskeletal: positive for back pain    LABORATORY DATA: Lab Results  Component Value Date   WBC 12.1 (H) 03/29/2022   HGB 14.3 03/29/2022   HCT 47.1 03/29/2022   MCV 97.5 03/29/2022   PLT 241 03/29/2022      Chemistry      Component Value Date/Time   NA 143 03/30/2022 0529   K 3.8 03/30/2022 0529   CL 107 03/30/2022 0529   CO2 28 03/30/2022 0529   BUN 21 03/30/2022 0529   CREATININE 0.89 03/30/2022 0529   CREATININE 0.84 08/08/2020 0902      Component Value Date/Time   CALCIUM 8.9 03/30/2022 0529   ALKPHOS 104 12/14/2021 1236   AST 17 12/14/2021 1236   AST 16 08/08/2020 0902   ALT 19 12/14/2021 1236   ALT 16 08/08/2020 0902   BILITOT 0.5 12/14/2021 1236   BILITOT 0.7 08/08/2020 0902       RADIOGRAPHIC STUDIES: No results found.  ASSESSMENT AND PLAN:  This is a very pleasant 66 years old white male with stage IIIA non-small cell lung cancer, squamous cell carcinoma diagnosed in December 2019. The patient completed a course of treatment with concurrent  chemoradiation with weekly carboplatin and paclitaxel status post 6 cycles.  He had partial response to this treatment. He was started on consolidation treatment with Imfinzi status post 26 cycles. The patient tolerated his previous treatment fairly well with no concerning adverse effects. The patient has been in observation now for several years and he is feeling fine with no concerning complaints except for the baseline shortness of breath secondary to COPD and chronic back pain from degenerative disc disease. He was supposed to have repeat CT scan of the chest this month but the patient moved to [facility]. I referred him to medical oncology in that area and he was supposed to get CT scan of the chest once he establish care with the medical oncologist. He will also need Port-A-Cath flush every 6-8 weeks. The patient was advised to call if he has any concerning issues until he establish care with the medical oncologist. I discussed the assessment and treatment plan with the patient. The patient was provided an opportunity to ask questions and all were answered. The patient agreed with the plan and demonstrated an understanding of the instructions.   The patient was advised to call back or seek an in-person evaluation if the symptoms worsen or if the condition fails to improve as anticipated.  I provided 20 minutes of face-to-face video visit time during this encounter, and > 50% was spent counseling as documented under my assessment & plan.  Eilleen Kempf, MD 05/01/2022 8:19 AM  Disclaimer: This note was dictated with voice recognition software. Similar sounding words can inadvertently be transcribed and may not be corrected upon review.

## 2022-05-01 NOTE — Telephone Encounter (Signed)
Per Dr. Worthy Flank request, clinical information faxed to Dr. Narda Rutherford office. Fax# [phone].

## 2022-05-03 ENCOUNTER — Encounter (HOSPITAL_COMMUNITY): Payer: Medicare Other

## 2022-05-03 ENCOUNTER — Other Ambulatory Visit: Payer: Self-pay | Admitting: Physician Assistant

## 2022-05-08 ENCOUNTER — Encounter (HOSPITAL_COMMUNITY): Payer: Medicare Other

## 2022-05-10 ENCOUNTER — Encounter (HOSPITAL_COMMUNITY): Payer: Medicare Other

## 2022-05-15 ENCOUNTER — Encounter (HOSPITAL_COMMUNITY): Payer: Medicare Other

## 2022-05-17 ENCOUNTER — Encounter (HOSPITAL_COMMUNITY): Payer: Medicare Other

## 2022-05-22 ENCOUNTER — Encounter (HOSPITAL_COMMUNITY): Payer: Medicare Other

## 2022-05-24 ENCOUNTER — Encounter (HOSPITAL_COMMUNITY): Payer: Medicare Other

## 2022-05-29 ENCOUNTER — Encounter (HOSPITAL_COMMUNITY): Payer: Medicare Other

## 2022-05-31 ENCOUNTER — Encounter (HOSPITAL_COMMUNITY): Payer: Medicare Other

## 2022-06-05 ENCOUNTER — Encounter (HOSPITAL_COMMUNITY): Payer: Medicare Other

## 2022-06-07 ENCOUNTER — Encounter (HOSPITAL_COMMUNITY): Payer: Medicare Other

## 2022-06-12 ENCOUNTER — Encounter (HOSPITAL_COMMUNITY): Payer: Medicare Other

## 2022-06-14 ENCOUNTER — Encounter (HOSPITAL_COMMUNITY): Payer: Medicare Other

## 2022-06-19 ENCOUNTER — Encounter (HOSPITAL_COMMUNITY): Payer: Medicare Other

## 2022-06-21 ENCOUNTER — Encounter (HOSPITAL_COMMUNITY): Payer: Medicare Other

## 2022-06-26 ENCOUNTER — Encounter (HOSPITAL_COMMUNITY): Payer: Medicare Other

## 2022-06-28 ENCOUNTER — Ambulatory Visit: Payer: Medicare Other | Admitting: Cardiovascular Disease

## 2022-06-28 ENCOUNTER — Encounter (HOSPITAL_COMMUNITY): Payer: Medicare Other

## 2022-07-03 ENCOUNTER — Encounter (HOSPITAL_COMMUNITY): Payer: Medicare Other

## 2022-07-05 ENCOUNTER — Encounter (HOSPITAL_COMMUNITY): Payer: Medicare Other

## 2022-07-10 ENCOUNTER — Encounter (HOSPITAL_COMMUNITY): Payer: Medicare Other

## 2022-07-12 ENCOUNTER — Encounter (HOSPITAL_COMMUNITY): Payer: Medicare Other

## 2022-07-17 ENCOUNTER — Encounter (HOSPITAL_COMMUNITY): Payer: Medicare Other

## 2022-07-19 ENCOUNTER — Encounter (HOSPITAL_COMMUNITY): Payer: Medicare Other

## 2022-07-24 ENCOUNTER — Encounter (HOSPITAL_COMMUNITY): Payer: Medicare Other

## 2022-07-26 ENCOUNTER — Encounter (HOSPITAL_COMMUNITY): Payer: Medicare Other

## 2022-07-31 ENCOUNTER — Encounter (HOSPITAL_COMMUNITY): Payer: Medicare Other

## 2022-08-02 ENCOUNTER — Encounter (HOSPITAL_COMMUNITY): Payer: Medicare Other

## 2022-10-31 ENCOUNTER — Other Ambulatory Visit: Payer: Self-pay | Admitting: Cardiovascular Disease

## 2022-12-13 DEATH — deceased
